# Patient Record
Sex: Male | Born: 1935 | Race: White | Hispanic: No | Marital: Married | Smoking: Never smoker
Health system: Southern US, Community
[De-identification: ages and names within clinical notes are randomized; demographics above are authoritative.]

## PROBLEM LIST (undated history)

## (undated) DIAGNOSIS — I10 Essential (primary) hypertension: Secondary | ICD-10-CM

## (undated) DIAGNOSIS — I350 Nonrheumatic aortic (valve) stenosis: Secondary | ICD-10-CM

## (undated) DIAGNOSIS — E7800 Pure hypercholesterolemia, unspecified: Secondary | ICD-10-CM

## (undated) DIAGNOSIS — I2510 Atherosclerotic heart disease of native coronary artery without angina pectoris: Secondary | ICD-10-CM

## (undated) DIAGNOSIS — M1990 Unspecified osteoarthritis, unspecified site: Secondary | ICD-10-CM

## (undated) DIAGNOSIS — IMO0002 Reserved for concepts with insufficient information to code with codable children: Secondary | ICD-10-CM

## (undated) DIAGNOSIS — N400 Enlarged prostate without lower urinary tract symptoms: Secondary | ICD-10-CM

## (undated) DIAGNOSIS — Z953 Presence of xenogenic heart valve: Secondary | ICD-10-CM

## (undated) DIAGNOSIS — R011 Cardiac murmur, unspecified: Secondary | ICD-10-CM

## (undated) DIAGNOSIS — K922 Gastrointestinal hemorrhage, unspecified: Secondary | ICD-10-CM

## (undated) DIAGNOSIS — R7303 Prediabetes: Secondary | ICD-10-CM

## (undated) HISTORY — DX: Presence of xenogenic heart valve: Z95.3

## (undated) HISTORY — DX: Benign prostatic hyperplasia without lower urinary tract symptoms: N40.0

## (undated) HISTORY — DX: Nonrheumatic aortic (valve) stenosis: I35.0

## (undated) HISTORY — DX: Reserved for concepts with insufficient information to code with codable children: IMO0002

## (undated) HISTORY — DX: Unspecified osteoarthritis, unspecified site: M19.90

## (undated) HISTORY — PX: APPENDECTOMY: SHX54

---

## 2003-05-25 ENCOUNTER — Ambulatory Visit (HOSPITAL_COMMUNITY): Admission: RE | Admit: 2003-05-25 | Discharge: 2003-05-25 | Payer: Self-pay | Admitting: Gastroenterology

## 2003-05-26 ENCOUNTER — Encounter (INDEPENDENT_AMBULATORY_CARE_PROVIDER_SITE_OTHER): Payer: Self-pay | Admitting: Specialist

## 2004-07-10 ENCOUNTER — Encounter (INDEPENDENT_AMBULATORY_CARE_PROVIDER_SITE_OTHER): Payer: Self-pay | Admitting: Specialist

## 2004-07-10 ENCOUNTER — Ambulatory Visit (HOSPITAL_COMMUNITY): Admission: RE | Admit: 2004-07-10 | Discharge: 2004-07-10 | Payer: Self-pay | Admitting: Gastroenterology

## 2009-05-10 ENCOUNTER — Ambulatory Visit: Payer: Self-pay | Admitting: Surgery

## 2011-02-18 NOTE — Procedures (Signed)
DUPLEX DEEP VENOUS EXAM - LOWER EXTREMITY   INDICATION:  Left knee pain.   HISTORY:  Edema:  No.  Trauma/Surgery:  No.  Pain:  No.  PE:  No.  Previous DVT:  No.  Anticoagulants:  No.  Other:   DUPLEX EXAM:                CFV   SFV   PopV  PTV    GSV                R  L  R  L  R  L  R   L  R  L  Thrombosis    o  o     o     o      o     o  Spontaneous   +  +     +     +      +     +  Phasic        +  +     +     +      +     +  Augmentation  +  +     +     +      +     +  Compressible  +  +     +     +      +     +  Competent     +  +     +     +      +     +   Legend:  + - yes  o - no  p - partial  D - decreased   IMPRESSION:  No evidence of deep or superficial vein thrombosis in left  lower extremity.    _____________________________  Janetta Hora Fields, MD   AC/MEDQ  D:  05/10/2009  T:  05/10/2009  Job:  [id number]

## 2011-02-21 NOTE — Op Note (Signed)
NAMEJOVIN, [name]               ACCOUNT NO.:  [id number]   MEDICAL RECORD NO.:  [id number]          PATIENT TYPE:  AMB   LOCATION:  ENDO                         FACILITY:  [facility]   PHYSICIAN:  Petra Kuba, M.D.    DATE OF BIRTH:  Apr 14, 1924   DATE OF PROCEDURE:  07/10/2004  DATE OF DISCHARGE:                                 OPERATIVE REPORT   PROCEDURE:  Colonoscopy with polypectomy.   INDICATION:  History of colon polyps, due for repeat screening.  Last one  had an anastomotic polyp that was not possibly completely removed based on  its lack of polypoid appearance, want to recheck.  Consent was signed after  risks, benefits, methods, options thoroughly discussed multiple times in the  past.   MEDICINES USED:  Demerol 60, Versed 6.   PROCEDURE:  Rectal inspection was pertinent for external hemorrhoids.  Small  digital exam was negative.  The video colonoscope was inserted, easily  advanced around the colon to the anastomosis.  This did not require any  abdominal pressure or any position changes.  On insertion, some left-sided  scattered diverticula were seen.  The anastomosis was identified by small  bowel side to colon end anastomosis.  There was one small right-sided polyp  seen that was hot biopsied x1 and put in the first container.  To get a  better look at the anastomosis we looked not only with him on his left side  but rolled him on his back and there were two small areas of the anastomosis  which were a little edematous and a few cold biopsies of the few different  areas were obtained but no obvious polypoid lesion was seen.  This was put  in the second container.  The scope was slowly withdrawn.  The prep was  adequate, there was some liquid stool that required washing and suctioning,  but on slow withdrawal through the rest of the colon other than the  occasional left-sided diverticula no other abnormalities were seen.  Once  back in the rectum, anorectal pull  through and retroflexion confirmed some  small hemorrhoids.  The scope was straightened and readvanced a short ways  up the left side of the colon.  Air was suctioned, scope removed.  The  patient tolerated the procedure well.  There was no obvious immediate  complication.   ENDOSCOPIC DIAGNOSES:  1.  Internal, external hemorrhoids.  2.  Occasional left-sided diverticula.  3.  Right colon small polyp hot biopsied.  4.  Anastomosis with a few areas of questionable edema and erythema status      post biopsy.  5.  Otherwise within normal limits to the anastomosis.   PLAN:  Await pathology to determine future colonic screening, otherwise  return to the care of Dr. Arvilla Market for the customary healthcare maintenance.  I will be on standby to help p.r.n.       MEM/MEDQ  D:  07/10/2004  T:  07/10/2004  Job:  [id number]   cc:   Donia Guiles, M.D.  [address] [place]  [place] [address]  Fax: [phone]

## 2011-02-21 NOTE — Op Note (Signed)
[name], [name]                         ACCOUNT NO.:  [id number]   MEDICAL RECORD NO.:  [id number]                   PATIENT TYPE:  AMB   LOCATION:  ENDO                                 FACILITY:  [facility]   PHYSICIAN:  Petra Kuba, M.D.                 DATE OF BIRTH:  12/06/1923   DATE OF PROCEDURE:  05/25/2003  DATE OF DISCHARGE:                                 OPERATIVE REPORT   PROCEDURE:  Colonoscopy with biopsy.   INDICATIONS:  The patient with a history of colon polyps.  Due for repeat  screening.  Consent was signed after risks, benefits, methods, and options  were thoroughly discussed in the office in the past.   PREMEDICATIONS:  Demerol 40 mg, Versed 4 mg.   DESCRIPTION OF PROCEDURE:  Rectal inspection pertinent for external  hemorrhoids, small.  Digital exam was negative.  Video colonoscope was  inserted and with minimal abdominal pressure, easily advanced around the  colon to the anastomosis.  Other than left-sided diverticula, no other  abnormalities was seen on insertion.  The anastomosis was identified, a  small bowel and colon end .  Along the anastomotic surgical line was a small  nodule.  We took a few biopsies of that.  It was probably just scar tissue.  We wanted to make sure.  Prep was adequate.  There was some liquid stool  that required washing and suctioning.  On slow withdrawal through the colon,  other than some left-sided diverticula, no other abnormalities were seen.  Specifically, no polyps, tumors or masses.  Anorectal pull-through and  retroflexion confirms some small hemorrhoids in the rectum.  The scope was  reinserted a short ways up the left side of the colon.  Air was suctioned  and the scope removed.  The patient tolerated the procedure well.  There  were no obvious immediate complications.   ENDOSCOPIC DIAGNOSES:  1. Internal and external hemorrhoids.  2. Left-sided diverticula.  3. Small bowel in the colon and anastomosis.  4.  Questionable anastomotic polyp, status post cold biopsy.  5. Otherwise within normal limits.    PLAN:  Await pathology.  If this is an adenoma, we will need to proceed  sooner and be more aggressive; otherwise consider if doing well medically,  recheck colon screening in five years.  Happy to see back p.r.n.  Otherwise  return care to Dr. Arvilla Market  for the customary health care maintenance to  include yearly rectals and guaiacs.                                                Petra Kuba, M.D.    MEM/MEDQ  D:  05/25/2003  T:  05/26/2003  Job:  [id number]   cc:   Izora Ribas  Roderick Pee, M.D.  [address] [place]  [place] [address]  Fax: [phone]

## 2011-10-13 DIAGNOSIS — L259 Unspecified contact dermatitis, unspecified cause: Secondary | ICD-10-CM | POA: Diagnosis not present

## 2011-10-13 DIAGNOSIS — L538 Other specified erythematous conditions: Secondary | ICD-10-CM | POA: Diagnosis not present

## 2011-11-10 ENCOUNTER — Emergency Department (HOSPITAL_COMMUNITY): Payer: Medicare Other

## 2011-11-10 ENCOUNTER — Emergency Department (HOSPITAL_COMMUNITY)
Admission: EM | Admit: 2011-11-10 | Discharge: 2011-11-10 | Disposition: A | Discharge status: Home / Self Care | Payer: Medicare Other | Attending: Emergency Medicine | Admitting: Emergency Medicine

## 2011-11-10 ENCOUNTER — Other Ambulatory Visit: Payer: Self-pay

## 2011-11-10 ENCOUNTER — Encounter (HOSPITAL_COMMUNITY): Payer: Self-pay | Admitting: Neurology

## 2011-11-10 DIAGNOSIS — R109 Unspecified abdominal pain: Secondary | ICD-10-CM | POA: Insufficient documentation

## 2011-11-10 DIAGNOSIS — R1013 Epigastric pain: Secondary | ICD-10-CM | POA: Diagnosis not present

## 2011-11-10 DIAGNOSIS — K5730 Diverticulosis of large intestine without perforation or abscess without bleeding: Secondary | ICD-10-CM | POA: Diagnosis not present

## 2011-11-10 DIAGNOSIS — R1110 Vomiting, unspecified: Secondary | ICD-10-CM | POA: Insufficient documentation

## 2011-11-10 DIAGNOSIS — K8020 Calculus of gallbladder without cholecystitis without obstruction: Secondary | ICD-10-CM | POA: Diagnosis not present

## 2011-11-10 HISTORY — DX: Pure hypercholesterolemia, unspecified: E78.00

## 2011-11-10 HISTORY — DX: Cardiac murmur, unspecified: R01.1

## 2011-11-10 HISTORY — DX: Prediabetes: R73.03

## 2011-11-10 HISTORY — DX: Essential (primary) hypertension: I10

## 2011-11-10 HISTORY — DX: Gastrointestinal hemorrhage, unspecified: K92.2

## 2011-11-10 LAB — COMPREHENSIVE METABOLIC PANEL
CO2: 26 mEq/L (ref 19–32)
Calcium: 10.8 mg/dL — ABNORMAL HIGH (ref 8.4–10.5)
Creatinine, Ser: 1.03 mg/dL (ref 0.50–1.35)
GFR calc Af Amer: 74 mL/min — ABNORMAL LOW (ref >90)
GFR calc non Af Amer: 64 mL/min — ABNORMAL LOW (ref >90)
Glucose, Bld: 229 mg/dL — ABNORMAL HIGH (ref 70–99)

## 2011-11-10 LAB — DIFFERENTIAL
Eosinophils Absolute: 0 10*3/uL (ref 0.0–0.7)
Eosinophils Relative: 0 % (ref 0–5)
Lymphocytes Relative: 8 % — ABNORMAL LOW (ref 12–46)
Lymphs Abs: 0.7 10*3/uL (ref 0.7–4.0)
Monocytes Absolute: 0.6 10*3/uL (ref 0.1–1.0)

## 2011-11-10 LAB — CBC
HCT: 35.4 % — ABNORMAL LOW (ref 39.0–52.0)
MCH: 29.7 pg (ref 26.0–34.0)
MCV: 85.5 fL (ref 78.0–100.0)
RBC: 4.14 MIL/uL — ABNORMAL LOW (ref 4.22–5.81)
RDW: 13.8 % (ref 11.5–15.5)
WBC: 8.3 10*3/uL (ref 4.0–10.5)

## 2011-11-10 LAB — LIPASE, BLOOD: Lipase: 29 U/L (ref 11–59)

## 2011-11-10 LAB — URINALYSIS, ROUTINE W REFLEX MICROSCOPIC
Leukocytes, UA: NEGATIVE
Nitrite: NEGATIVE
Protein, ur: NEGATIVE mg/dL
Urobilinogen, UA: 0.2 mg/dL (ref 0.0–1.0)

## 2011-11-10 MED ORDER — ONDANSETRON HCL 4 MG PO TABS: 4.0000 mg | ORAL_TABLET | Freq: Four times a day (QID) | ORAL | Status: AC

## 2011-11-10 MED ORDER — SODIUM CHLORIDE 0.9 % IV SOLN
Freq: Once | INTRAVENOUS | Status: AC
  Administered 2011-11-10: 08:00:00 via INTRAVENOUS

## 2011-11-10 MED ORDER — HYDROCODONE-ACETAMINOPHEN 5-500 MG PO TABS: 1.0000 | ORAL_TABLET | Freq: Four times a day (QID) | ORAL | Status: AC | PRN

## 2011-11-10 MED ORDER — ONDANSETRON HCL 4 MG/2ML IJ SOLN
4.0000 mg | Freq: Once | INTRAMUSCULAR | Status: AC
  Administered 2011-11-10: 4 mg via INTRAVENOUS
  Filled 2011-11-10: qty 2

## 2011-11-10 MED ORDER — IOHEXOL 300 MG/ML  SOLN
80.0000 mL | Freq: Once | INTRAMUSCULAR | Status: AC | PRN
  Administered 2011-11-10: 80 mL via INTRAVENOUS

## 2011-11-10 MED ORDER — MORPHINE SULFATE 4 MG/ML IJ SOLN
4.0000 mg | Freq: Once | INTRAMUSCULAR | Status: AC
  Administered 2011-11-10: 4 mg via INTRAVENOUS
  Filled 2011-11-10: qty 1

## 2011-11-10 NOTE — ED Notes (Signed)
Contrast given to patient

## 2011-11-10 NOTE — ED Provider Notes (Signed)
History     CSN: [id number]  Arrival date & time 11/10/11  4098   First MD Initiated Contact with Patient 11/10/11 [phone]      Chief Complaint  Patient presents with  . Abdominal Pain  . Emesis    (Consider location/radiation/quality/duration/timing/severity/associated sxs/prior treatment) HPI Comments: Started at 4AM with lower abd pain, left greater than right.  Has history of ruptured appendix 2-3 years ago.  No diarrhea.  No fevers.    Patient is a 78 y.o. male presenting with abdominal pain and vomiting. The history is provided by the patient.  Abdominal Pain The primary symptoms of the illness include abdominal pain and vomiting. The primary symptoms of the illness do not include fever, fatigue, shortness of breath, diarrhea or dysuria. The current episode started 6 to 12 hours ago. The onset of the illness was sudden. The problem has been gradually worsening.  The patient has not had a change in bowel habit. Symptoms associated with the illness do not include chills or constipation.  Emesis  Associated symptoms include abdominal pain. Pertinent negatives include no chills, no diarrhea and no fever.    No past medical history on file.  No past surgical history on file.  No family history on file.  History  Substance Use Topics  . Smoking status: Not on file  . Smokeless tobacco: Not on file  . Alcohol Use: Not on file      Review of Systems  Constitutional: Negative for fever, chills and fatigue.  Respiratory: Negative for shortness of breath.   Gastrointestinal: Positive for vomiting and abdominal pain. Negative for diarrhea and constipation.  Genitourinary: Negative for dysuria.  All other systems reviewed and are negative.    Allergies  Review of patient's allergies indicates not on file.  Home Medications  No current outpatient prescriptions on file.  BP 162/79  Pulse 73  Temp(Src) 97.7 F (36.5 C) (Oral)  Resp 20  SpO2 99%  Physical Exam    Nursing note and vitals reviewed. Constitutional: He is oriented to person, place, and time. He appears well-developed and well-nourished.  HENT:  Head: Normocephalic and atraumatic.  Neck: Normal range of motion. Neck supple.  Cardiovascular: Normal rate and regular rhythm.   Murmur heard.      2/6 sem heard best at rusb.  Pulmonary/Chest: Effort normal and breath sounds normal. No respiratory distress.  Abdominal: Soft. Bowel sounds are normal.       There is ttp in the lower abd, left greater than right.  No rebound or guarding.  Musculoskeletal: Normal range of motion. He exhibits no edema.  Neurological: He is alert and oriented to person, place, and time.  Skin: Skin is warm and dry. He is not diaphoretic.    ED Course  Procedures (including critical care time)   Labs Reviewed  CBC  DIFFERENTIAL  COMPREHENSIVE METABOLIC PANEL  LIPASE, BLOOD   No results found.   No diagnosis found.   Date: 11/10/2011  Rate: 77  Rhythm: normal sinus rhythm  QRS Axis: normal  Intervals: PR prolonged  ST/T Wave abnormalities: normal  Conduction Disutrbances:first-degree A-V block   Narrative Interpretation:   Old EKG Reviewed: none available    MDM  The labs and ct scan are reassuring.  I see no acute surgical findings.  Will treat with pain and nausea meds, give time.  To return prn if worsens.        Geoffery Lyons, MD 11/10/11 1048

## 2011-11-10 NOTE — ED Notes (Signed)
Pt reports waking up from sleep at 0130 with midepigastric pain. Reports nausea, vomiting about every 30 mins. Denying any diarrhea. Last BM was last night. Pt reports not being able to keep anything down, emesis is green in coloration. Pt alert and oriented. Denying any CP or SOB.

## 2011-11-10 NOTE — ED Notes (Signed)
Pt reports waking up last night 0130 with midepigastric abdominal pain. Reports vomiting every 30 mins. Emesis is green. Denying any diarrhea. Skin warm and dry. NAD. Lung sounds clear. No vomiting at this time. Reporting nausea. Denying any CP or SOB. Vitals stable. Family at bedside

## 2011-12-29 DIAGNOSIS — Z961 Presence of intraocular lens: Secondary | ICD-10-CM | POA: Diagnosis not present

## 2012-02-17 DIAGNOSIS — E782 Mixed hyperlipidemia: Secondary | ICD-10-CM | POA: Diagnosis not present

## 2012-02-17 DIAGNOSIS — I10 Essential (primary) hypertension: Secondary | ICD-10-CM | POA: Diagnosis not present

## 2012-02-17 DIAGNOSIS — L84 Corns and callosities: Secondary | ICD-10-CM | POA: Diagnosis not present

## 2012-02-17 DIAGNOSIS — I359 Nonrheumatic aortic valve disorder, unspecified: Secondary | ICD-10-CM | POA: Diagnosis not present

## 2012-02-17 DIAGNOSIS — E119 Type 2 diabetes mellitus without complications: Secondary | ICD-10-CM | POA: Diagnosis not present

## 2012-05-26 DIAGNOSIS — I10 Essential (primary) hypertension: Secondary | ICD-10-CM | POA: Diagnosis not present

## 2012-05-26 DIAGNOSIS — E782 Mixed hyperlipidemia: Secondary | ICD-10-CM | POA: Diagnosis not present

## 2012-05-26 DIAGNOSIS — I359 Nonrheumatic aortic valve disorder, unspecified: Secondary | ICD-10-CM | POA: Diagnosis not present

## 2012-05-27 DIAGNOSIS — I359 Nonrheumatic aortic valve disorder, unspecified: Secondary | ICD-10-CM | POA: Diagnosis not present

## 2012-05-27 DIAGNOSIS — I350 Nonrheumatic aortic (valve) stenosis: Secondary | ICD-10-CM

## 2012-05-27 HISTORY — DX: Nonrheumatic aortic (valve) stenosis: I35.0

## 2012-07-01 DIAGNOSIS — Z23 Encounter for immunization: Secondary | ICD-10-CM | POA: Diagnosis not present

## 2012-07-12 ENCOUNTER — Encounter: Payer: Self-pay | Admitting: *Deleted

## 2012-07-12 DIAGNOSIS — M1990 Unspecified osteoarthritis, unspecified site: Secondary | ICD-10-CM | POA: Insufficient documentation

## 2012-07-12 DIAGNOSIS — IMO0002 Reserved for concepts with insufficient information to code with codable children: Secondary | ICD-10-CM | POA: Insufficient documentation

## 2012-07-12 DIAGNOSIS — N400 Enlarged prostate without lower urinary tract symptoms: Secondary | ICD-10-CM | POA: Insufficient documentation

## 2012-07-12 DIAGNOSIS — Z953 Presence of xenogenic heart valve: Secondary | ICD-10-CM | POA: Insufficient documentation

## 2012-07-13 ENCOUNTER — Encounter: Payer: Self-pay | Admitting: Surgery

## 2012-07-13 ENCOUNTER — Institutional Professional Consult (permissible substitution) (INDEPENDENT_AMBULATORY_CARE_PROVIDER_SITE_OTHER): Payer: Medicare Other | Admitting: Surgery

## 2012-07-13 VITALS — BP 137/62 | HR 64 | Resp 18 | Ht 67.25 in | Wt 179.0 lb

## 2012-07-13 DIAGNOSIS — I359 Nonrheumatic aortic valve disorder, unspecified: Secondary | ICD-10-CM

## 2012-07-13 DIAGNOSIS — I350 Nonrheumatic aortic (valve) stenosis: Secondary | ICD-10-CM

## 2012-07-15 ENCOUNTER — Encounter: Payer: Self-pay | Admitting: Surgery

## 2012-07-15 NOTE — Progress Notes (Signed)
PCP is [name], MD Referring Provider is [name], MD  Chief Complaint  Patient presents with  . Aortic Stenosis    Referral from Dr [name] for surgical eval on aortic valve stenosis, ECHO 05/27/12    HPI:  The patient is an 43 her old fairly active gentleman with a history of diet-controlled diabetes, hypertension, hyperlipidemia, and a long history of peptic ulcer disease who has known aortic stenosis that has been followed by periodic echocardiogram. His most recent echocardiogram from 05/27/2012 shows critical aortic valve stenosis with a peak velocity of 4.1 cm/s with a peak gradient of 70 mm mercury and a mean gradient of 41 mm mercury. Aortic valve area is calculated at 0.74-0.81 cm. He has severe concentric left ventricular hypertrophy with ejection fraction of 60-65%. Right heart size and function are normal. There is mild mitral valve regurgitation with severe mitral annular calcification. The aortic valve leaflets are severely calcified. There is evidence of grade 2 diastolic dysfunction with elevated left atrial pressure. The patient has remained active and reports no symptoms.  Past Medical History  Diagnosis Date  . Borderline diabetes   . Hypertension   . Hypercholesteremia   . Heart murmur   . GI bleed   . Ulcer     PEPTIC ULCER DZ...DR. Ewing Schlein  . BPH (benign prostatic hypertrophy)   . Arthritis     OSTEO OF KNEE  . Aortic stenosis 05/27/12    Severe to critical    Past Surgical History  Procedure Date  . Appendectomy     History reviewed. No pertinent family history.  Social History History  Substance Use Topics  . Smoking status: Never Smoker   . Smokeless tobacco: Not on file  . Alcohol Use: No    Current Outpatient Prescriptions  Medication Sig Dispense Refill  . aspirin 325 MG EC tablet Take 325 mg by mouth daily as needed. For pain      . diltiazem (TIAZAC) 240 MG 24 hr capsule Take 240 mg by mouth daily.      . Famotidine  (PEPCID PO) Take 1 tablet by mouth daily.      Marland Kitchen glucose blood test strip 1 each by Other route as directed. ONE TOUCH ULTRA      . simvastatin (ZOCOR) 20 MG tablet Take 10 mg by mouth every evening.       . triamterene-hydrochlorothiazide (MAXZIDE-25) 37.5-25 MG per tablet Take 1 tablet by mouth daily.        No Known Allergies  Review of Systems  Constitutional: Positive for activity change. Negative for fever, chills, diaphoresis, appetite change, fatigue and unexpected weight change.  HENT: Negative.        Saw his dentist 1 week ago and uses antibiotic prophylaxis.  Eyes: Negative.   Respiratory: Negative.   Cardiovascular: Negative.   Gastrointestinal: Negative.   Genitourinary: Negative.   Musculoskeletal: Positive for arthralgias.  Neurological: Negative.   Hematological: Negative.   Psychiatric/Behavioral: Negative.     BP 137/62  Pulse 64  Resp 18  Ht 5' 7.25" (1.708 m)  Wt 179 lb (81.194 kg)  BMI 27.83 kg/m2  SpO2 98% Physical Exam  Constitutional: He is oriented to person, place, and time. He appears well-developed and well-nourished.  HENT:  Head: Normocephalic and atraumatic.  Mouth/Throat: Oropharynx is clear and moist.  Eyes: Conjunctivae normal and EOM are normal. Pupils are equal, round, and reactive to light.  Neck: Normal range of motion. Neck supple. No JVD present. No  thyromegaly present.  Cardiovascular: Normal rate and regular rhythm.        3/6 harsh systolic murmur over aorta  Pulmonary/Chest: Effort normal and breath sounds normal. No respiratory distress.  Abdominal: Soft. Bowel sounds are normal. He exhibits no distension and no mass. There is no tenderness.  Musculoskeletal: Normal range of motion. He exhibits no edema.  Lymphadenopathy:    He has no cervical adenopathy.  Neurological: He is alert and oriented to person, place, and time. He has normal strength. No cranial nerve deficit or sensory deficit.  Skin: Skin is warm and dry.    Psychiatric: He has a normal mood and affect.     Diagnostic Tests:  none  Impression/Plan:  He has critical aortic stenosis with severe concentric left ventricular hypertrophy by echocardiogram. Ejection fraction is normal. He reports being asymptomatic and has continued to mow his lawn over the summer and still works around the house. I am concerned he is reaching the point where he will start having significant symptoms and may have an acute decompensation. Since he is in overall good condition for his age I think it is best to consider proceeding ahead with aortic valve replacement while he is still still doing well. It is unlikely he will continue to do well for another year or two. I think he is a reasonable operative candidate especially considering his age and I don't think he is a candidate for TAVR, which is limited to unoperable patients at this time. I discussed the operative procedure in detail with the patient and his daughter, Erskine Squibb. I discussed the alternatives of surgery versus continued observation, benefits and risks of both, and they are inclined to proceed with surgery after Thanksgiving. He will require cardiac catheterization preoperatively and I think it would be best to do that as an outpatient and give him at least a week after the catheterization before during surgery. He is planning on going to the beach with his family over the Thanksgiving week and does not want to have cardiac catheterization right before he leaves. I think it would be best to do it either in the next few weeks or wait until he returns after Thanksgiving. I'll discuss this with Dr. [name].

## 2012-09-13 DIAGNOSIS — I10 Essential (primary) hypertension: Secondary | ICD-10-CM | POA: Diagnosis not present

## 2012-09-13 DIAGNOSIS — I359 Nonrheumatic aortic valve disorder, unspecified: Secondary | ICD-10-CM | POA: Diagnosis not present

## 2012-09-14 ENCOUNTER — Other Ambulatory Visit: Payer: Self-pay | Admitting: Interventional Cardiology

## 2012-09-15 ENCOUNTER — Other Ambulatory Visit: Payer: Self-pay | Admitting: Cardiology

## 2012-09-15 NOTE — H&P (Signed)
PRE CATH WORK UP/HS/424.1/LABS/CHEST XRAY/SEE LINDA.      HPI:     General:           [name] is a 78 yo male followed by Dr [name] with a hx of severe aortic stenosis with last echocardiogram 8/13 noted critical AS and LVH. Pt has been referrred to Dr Laneta Simmers to consider surgerical repair (TAVR), and needs cardiac cath prior to procedure. He states he is not having any pain but very fatigued, Shortness of breath and he does occasionally has chest tighness very fleeting last 1 minute, non exertional. He was at the beach for Thanksgiving and carried some light luggage without difficulty. He was not SOB walking into the office. no cough nor congestion..         Patient denies palpitations, syncope, swelling, nor PND. Occasional dizziness if bending over for a prolonged time. no falls.     ROS:      as noted in HPI, no fever, chills, nor respiratory complaints, no black or bloody BMs, no N/V, no falls, nor neurological changes. IVP dye--no allergies, appetite stable.     Medical History: Adult onset diabetes mellitus/diet, Hypertension, Hyperlipidemia, peptic ulcer disease - Dr Ewing Schlein, Benign prostatic hypotrophy, Osteoarthritis-knee, Aortic stenosis, Moderate with AVA .87 cm2 , 2011 Echo, repeat echocardiogram 8/13 with progressive critical Aortic stenosis, derm - Dr Terri Piedra, ophth - Dr Nile Riggs.      Surgical History: perf. appendectomy 01/1997, colonoscopy 8/04.      Hospitalization/Major Diagnostic Procedure: Gastric ulcer 1976.      Family History:  Father: deceased 35 yrs old age Mother: deceased 32 yrs Dementia Brother 1: deceased 37 yrs Infection Brother2: deceased 58 yrs Brother 3: alive [phone] yrs      Social History:      General: History of smoking  cigarettes: Never smoked.  no Alcohol. no Caffeine. no Recreational drug use. no Diet. Exercise: yes, walks daily. 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 Marital Status: married, Christeen Douglas (64 years married). Children: 2 kids Riley Lam), 3  grandkids.     For [employer].     Medications: Triamterene-HCTZ 37.5/25mg  tab 1 tablet once a day, Diltiazem HCl CR 240mg  cap 1 capsule once a day, Aspirin EC 325 MG Tablet Delayed Release 1 tablet as needed prn, Pepcid AC 10 MG Tablet 1 tablet daily, One touch ultra test strips TEST STRIPS strips as directed as directed, Simvastatin 10 MG tablet 1 tablet once a day, Medication List reviewed and reconciled with the patient     Allergies: N.K.D.A.     Objective:    Vitals: Wt 179.6, Wt change 2.8 lb, Ht 67.25, BMI 27.92, Pulse sitting 64, BP sitting 140/72.     Examination:     Cardiology Exam:         GENERAL APPEARANCE: pleasant, NAD, comfortable.  HEENT: normal.  CAROTID UPSTROKE: no bruit, No JVD.  JVD: flat.  HEART: regular rate and rhythm, normal S1S2, no rub, no gallop, or click.  HEART MURMUR: grade 4/6, systolic murmur.  LUNGS: clear to auscultation, no wheezing/rhonchi/rales.  ABDOMEN: soft, non-tender, + bowel sounds.  EXTREMITIES: trace pretibial leg edema.  PERIPHERAL PULSES: 2+, bilateral.  NEUROLOGIC: grossly intact, cranial nerves intact, gait WNL.  MOOD: normal.            Assessment:    Assessment:  1. Aortic valve disorders - 424.1 (Primary), pt with critical Aortic Stenosis with plans to proceed with week with cardiac catheterization for clearance for Aortic surgry with Dr  Bartle.    2. Essential hypertension, benign - 401.1     Plan:    1. Aortic valve disorders        LAB: CBC with Diff     WBC 7.9 4.0-11.0 - K/ul         RBC 4.00 4.20-5.80 - M/uL L        HGB 12.7 13.0-17.0 - g/dL L        HCT 78.2 95.6-21.3 - % L       MCH 31.9 27.0-33.0 - pg        MPV 9.1 7.5-10.7 - fL        MCV 87.1 80.0-94.0 - fL        MCHC 36.0 32.0-36.0 - g/dL        RDW 08.6 57.8-46.9 - %        NRBC# 0.00 -        PLT 166 150-400 - K/uL        NEUT % 68.0 43.3-71.9 - %        NRBC% 0.00 - %         LYMPH% 16.3 16.8-43.5 - % L       MONO % 11.1 4.6-12.4 - %        EOS % 3.8 0.0-7.8  - %        BASO % 0.8 0.0-1.0 - %        NEUT # 5.3 1.9-7.2 - K/uL        LYMPH# 1.30 1.10-2.70 - K/uL         MONO # 0.9 0.3-0.8 - K/uL H       EOS # 0.3 0.0-0.6 - K/uL        BASO # 0.1 0.0-0.1 - K/uL               Apryl Brymer A 09/13/2012 01:08:35 PM > ok for cath        LAB: Comp Metabolic Panel      GLUCOSE 115 70-99 - mg/dL H       BUN 19 6-29 - mg/dL        CREATININE 5.28 0.60-1.30 - mg/dl         eGFR (NON-AFRICAN AMERICAN) 55 >60 - calc L       eGFR (AFRICAN AMERICAN) 67 >60 - calc        SODIUM 141 136-145 - mmol/L        POTASSIUM 4.3 3.5-5.5 - mmol/L        CHLORIDE 103 98-107 - mmol/L        C02 31 22-32 - mmol/L        ANION GAP 10.8 6.0-20.0 - mmol/L         CALCIUM 11.1 8.6-10.3 - mg/dL H       T PROTEIN 7.2 4.1-3.2 - g/dL        ALBUMIN 4.5 4.4-0.1 - g/dL        T.BILI 0.7 0.3-1.0 - mg/dL        ALP 56 02-725 - U/L        AST 19 0-39 - U/L        ALT 13 0-52 - U/L               Lakima Dona A 09/13/2012 01:09:05 PM > ok for cath        LAB: PT and PTT ([id number])     aPTT 30 24-33 - SEC  INR 1.0 0.8-1.2 -        Prothrombin Time 11.1 9.1-12.0 - SEC               Ruhan Borak A 09/14/2012 12:23:12 PM > ok for procedure   Diagnostic Imaging:Chest PA/Lat (Ordered for 09/13/2012) Phairas,Jodi 09/13/2012 10:53:47 AM > , x-ray completed.    Risks and benefits of cardiac catheterization have been reviewed including risk of stroke, heart attack, death, bleeding, renal impariment and arterial damage. There was ample oppurtuny to answer questions. Alternatives were discussed. Patient understands and wishes to proceed.          Procedures:      Venipuncture:          Venipuncture: performed in right arm, Howell,Kathleen 09/13/2012 11:02:25 AM > .          Immunizations:       Labs:      Procedure Codes: 28413 ECL CBC PLATELET DIFF, 80053 ECL COMP METABOLIC PANEL, 24401 BLOOD COLLECTION ROUTINE VENIPUNCTURE     Preventive:           Follow Up: HS  pending procedure (Reason: post cath)        Provider: Michaell Cowing. Emelda Fear, NP  Patient: Eric, Duncan  DOB: [birth year]  Date: 09/13/2012

## 2012-09-16 ENCOUNTER — Encounter (HOSPITAL_COMMUNITY): Payer: Self-pay | Admitting: General Practice

## 2012-09-16 ENCOUNTER — Inpatient Hospital Stay (HOSPITAL_BASED_OUTPATIENT_CLINIC_OR_DEPARTMENT_OTHER)
Admission: RE | Admit: 2012-09-16 | Discharge: 2012-09-16 | Disposition: A | Discharge status: Hospice | Payer: Medicare Other | Source: Ambulatory Visit | Attending: Interventional Cardiology | Admitting: Interventional Cardiology

## 2012-09-16 ENCOUNTER — Other Ambulatory Visit: Payer: Self-pay | Admitting: *Deleted

## 2012-09-16 ENCOUNTER — Inpatient Hospital Stay (HOSPITAL_COMMUNITY): Payer: Medicare Other

## 2012-09-16 ENCOUNTER — Encounter (HOSPITAL_BASED_OUTPATIENT_CLINIC_OR_DEPARTMENT_OTHER)
Admission: RE | Disposition: A | Discharge status: Hospice | Payer: Self-pay | Source: Ambulatory Visit | Attending: Interventional Cardiology

## 2012-09-16 ENCOUNTER — Inpatient Hospital Stay (HOSPITAL_COMMUNITY)
Admission: AD | Admit: 2012-09-16 | Discharge: 2012-09-24 | DRG: 217 | Disposition: A | Discharge status: Home / Self Care | Payer: Medicare Other | Source: Ambulatory Visit | Attending: Cardiothoracic Surgery | Admitting: Cardiothoracic Surgery

## 2012-09-16 DIAGNOSIS — I359 Nonrheumatic aortic valve disorder, unspecified: Secondary | ICD-10-CM | POA: Insufficient documentation

## 2012-09-16 DIAGNOSIS — I350 Nonrheumatic aortic (valve) stenosis: Secondary | ICD-10-CM

## 2012-09-16 DIAGNOSIS — I2510 Atherosclerotic heart disease of native coronary artery without angina pectoris: Secondary | ICD-10-CM | POA: Diagnosis present

## 2012-09-16 DIAGNOSIS — E876 Hypokalemia: Secondary | ICD-10-CM | POA: Diagnosis not present

## 2012-09-16 DIAGNOSIS — Z7982 Long term (current) use of aspirin: Secondary | ICD-10-CM | POA: Diagnosis not present

## 2012-09-16 DIAGNOSIS — N400 Enlarged prostate without lower urinary tract symptoms: Secondary | ICD-10-CM | POA: Diagnosis present

## 2012-09-16 DIAGNOSIS — I4891 Unspecified atrial fibrillation: Secondary | ICD-10-CM

## 2012-09-16 DIAGNOSIS — Z79899 Other long term (current) drug therapy: Secondary | ICD-10-CM | POA: Diagnosis not present

## 2012-09-16 DIAGNOSIS — Z951 Presence of aortocoronary bypass graft: Secondary | ICD-10-CM | POA: Diagnosis present

## 2012-09-16 DIAGNOSIS — I10 Essential (primary) hypertension: Secondary | ICD-10-CM | POA: Diagnosis present

## 2012-09-16 DIAGNOSIS — R7309 Other abnormal glucose: Secondary | ICD-10-CM | POA: Diagnosis present

## 2012-09-16 DIAGNOSIS — D696 Thrombocytopenia, unspecified: Secondary | ICD-10-CM | POA: Diagnosis present

## 2012-09-16 DIAGNOSIS — E7800 Pure hypercholesterolemia, unspecified: Secondary | ICD-10-CM | POA: Diagnosis present

## 2012-09-16 DIAGNOSIS — I200 Unstable angina: Secondary | ICD-10-CM | POA: Diagnosis present

## 2012-09-16 DIAGNOSIS — Z9889 Other specified postprocedural states: Secondary | ICD-10-CM | POA: Diagnosis not present

## 2012-09-16 DIAGNOSIS — Z952 Presence of prosthetic heart valve: Secondary | ICD-10-CM

## 2012-09-16 DIAGNOSIS — D62 Acute posthemorrhagic anemia: Secondary | ICD-10-CM | POA: Diagnosis not present

## 2012-09-16 DIAGNOSIS — J9819 Other pulmonary collapse: Secondary | ICD-10-CM | POA: Diagnosis not present

## 2012-09-16 DIAGNOSIS — N401 Enlarged prostate with lower urinary tract symptoms: Secondary | ICD-10-CM | POA: Diagnosis not present

## 2012-09-16 DIAGNOSIS — J449 Chronic obstructive pulmonary disease, unspecified: Secondary | ICD-10-CM | POA: Diagnosis not present

## 2012-09-16 DIAGNOSIS — J90 Pleural effusion, not elsewhere classified: Secondary | ICD-10-CM | POA: Diagnosis not present

## 2012-09-16 HISTORY — DX: Atherosclerotic heart disease of native coronary artery without angina pectoris: I25.10

## 2012-09-16 HISTORY — DX: Presence of aortocoronary bypass graft: Z95.1

## 2012-09-16 LAB — PROTIME-INR
INR: 1.25 (ref 0.00–1.49)
Prothrombin Time: 15.5 seconds — ABNORMAL HIGH (ref 11.6–15.2)

## 2012-09-16 LAB — COMPREHENSIVE METABOLIC PANEL
ALT: 12 U/L (ref 0–53)
AST: 22 U/L (ref 0–37)
Albumin: 3.5 g/dL (ref 3.5–5.2)
Alkaline Phosphatase: 55 U/L (ref 39–117)
BUN: 24 mg/dL — ABNORMAL HIGH (ref 6–23)
CO2: 28 mEq/L (ref 19–32)
Calcium: 10 mg/dL (ref 8.4–10.5)
Chloride: 103 mEq/L (ref 96–112)
Creatinine, Ser: 1.3 mg/dL (ref 0.50–1.35)
GFR calc Af Amer: 56 mL/min — ABNORMAL LOW (ref >90)
GFR calc non Af Amer: 48 mL/min — ABNORMAL LOW (ref >90)
Glucose, Bld: 124 mg/dL — ABNORMAL HIGH (ref 70–99)
Potassium: 3.8 mEq/L (ref 3.5–5.1)
Sodium: 139 mEq/L (ref 135–145)
Total Bilirubin: 0.5 mg/dL (ref 0.3–1.2)
Total Protein: 6.7 g/dL (ref 6.0–8.3)

## 2012-09-16 LAB — CBC WITH DIFFERENTIAL/PLATELET
Basophils Absolute: 0 10*3/uL (ref 0.0–0.1)
Basophils Relative: 0 % (ref 0–1)
Eosinophils Absolute: 0 10*3/uL (ref 0.0–0.7)
Eosinophils Relative: 0 % (ref 0–5)
HCT: 31.2 % — ABNORMAL LOW (ref 39.0–52.0)
Hemoglobin: 11.1 g/dL — ABNORMAL LOW (ref 13.0–17.0)
Lymphocytes Relative: 23 % (ref 12–46)
Lymphs Abs: 1.5 10*3/uL (ref 0.7–4.0)
MCH: 30.8 pg (ref 26.0–34.0)
MCHC: 35.6 g/dL (ref 30.0–36.0)
MCV: 86.7 fL (ref 78.0–100.0)
Monocytes Absolute: 0.8 10*3/uL (ref 0.1–1.0)
Monocytes Relative: 13 % — ABNORMAL HIGH (ref 3–12)
Neutro Abs: 4.2 10*3/uL (ref 1.7–7.7)
Neutrophils Relative %: 64 % (ref 43–77)
Platelets: 150 10*3/uL (ref 150–400)
RBC: 3.6 MIL/uL — ABNORMAL LOW (ref 4.22–5.81)
RDW: 13.8 % (ref 11.5–15.5)
WBC: 6.6 10*3/uL (ref 4.0–10.5)

## 2012-09-16 LAB — BLOOD GAS, ARTERIAL
Acid-Base Excess: 0.1 mmol/L (ref 0.0–2.0)
Bicarbonate: 24 mEq/L (ref 20.0–24.0)
Drawn by: 347641
FIO2: 0.21 %
O2 Saturation: 93 %
Patient temperature: 98.6
TCO2: 25.1 mmol/L (ref 0–100)
pCO2 arterial: 37.6 mmHg (ref 35.0–45.0)
pH, Arterial: 7.421 (ref 7.350–7.450)
pO2, Arterial: 65.6 mmHg — ABNORMAL LOW (ref 80.0–100.0)

## 2012-09-16 LAB — APTT: aPTT: 52 seconds — ABNORMAL HIGH (ref 24–37)

## 2012-09-16 LAB — SURGICAL PCR SCREEN
MRSA, PCR: NEGATIVE
Staphylococcus aureus: NEGATIVE

## 2012-09-16 LAB — POCT I-STAT 3, ART BLOOD GAS (G3+)
O2 Saturation: 96 %
pCO2 arterial: 41.6 mmHg (ref 35.0–45.0)
pH, Arterial: 7.361 (ref 7.350–7.450)
pO2, Arterial: 84 mmHg (ref 80.0–100.0)

## 2012-09-16 LAB — POCT I-STAT 3, VENOUS BLOOD GAS (G3P V)
Bicarbonate: 23.8 mEq/L (ref 20.0–24.0)
pH, Ven: 7.358 — ABNORMAL HIGH (ref 7.250–7.300)
pO2, Ven: 37 mmHg (ref 30.0–45.0)

## 2012-09-16 LAB — GLUCOSE, CAPILLARY: Glucose-Capillary: 154 mg/dL — ABNORMAL HIGH (ref 70–99)

## 2012-09-16 SURGERY — JV LEFT AND RIGHT HEART CATHETERIZATION WITH CORONARY ANGIOGRAM
Anesthesia: Moderate Sedation

## 2012-09-16 MED ORDER — HEPARIN (PORCINE) IN NACL 100-0.45 UNIT/ML-% IJ SOLN
1350.0000 [IU]/h | INTRAMUSCULAR | Status: DC
  Administered 2012-09-16: 1350 [IU]/h via INTRAVENOUS
  Filled 2012-09-16 (×3): qty 250

## 2012-09-16 MED ORDER — ONDANSETRON HCL 4 MG/2ML IJ SOLN: 4.0000 mg | Freq: Four times a day (QID) | INTRAMUSCULAR | Status: DC | PRN

## 2012-09-16 MED ORDER — BISACODYL 5 MG PO TBEC
5.0000 mg | DELAYED_RELEASE_TABLET | Freq: Once | ORAL | Status: DC
  Filled 2012-09-16: qty 1

## 2012-09-16 MED ORDER — EPINEPHRINE HCL 1 MG/ML IJ SOLN
0.5000 ug/min | INTRAVENOUS | Status: DC
  Filled 2012-09-16: qty 4

## 2012-09-16 MED ORDER — SODIUM CHLORIDE 0.9 % IV SOLN: INTRAVENOUS | Status: DC

## 2012-09-16 MED ORDER — FAMOTIDINE 20 MG PO TABS
20.0000 mg | ORAL_TABLET | Freq: Every day | ORAL | Status: DC
  Administered 2012-09-16: 20 mg via ORAL
  Filled 2012-09-16 (×2): qty 1

## 2012-09-16 MED ORDER — NITROGLYCERIN IN D5W 200-5 MCG/ML-% IV SOLN
2.0000 ug/min | INTRAVENOUS | Status: DC
  Filled 2012-09-16: qty 250

## 2012-09-16 MED ORDER — METOPROLOL TARTRATE 12.5 MG HALF TABLET
12.5000 mg | ORAL_TABLET | Freq: Once | ORAL | Status: AC
  Administered 2012-09-17: 12.5 mg via ORAL
  Filled 2012-09-16: qty 1

## 2012-09-16 MED ORDER — OXYCODONE-ACETAMINOPHEN 5-325 MG PO TABS: 1.0000 | ORAL_TABLET | ORAL | Status: DC | PRN

## 2012-09-16 MED ORDER — DOPAMINE-DEXTROSE 3.2-5 MG/ML-% IV SOLN
2.0000 ug/kg/min | INTRAVENOUS | Status: DC
  Filled 2012-09-16: qty 250

## 2012-09-16 MED ORDER — TEMAZEPAM 15 MG PO CAPS: 15.0000 mg | ORAL_CAPSULE | Freq: Once | ORAL | Status: AC | PRN

## 2012-09-16 MED ORDER — MAGNESIUM SULFATE 50 % IJ SOLN
40.0000 meq | INTRAMUSCULAR | Status: DC
  Filled 2012-09-16: qty 10

## 2012-09-16 MED ORDER — ASPIRIN EC 325 MG PO TBEC
325.0000 mg | DELAYED_RELEASE_TABLET | Freq: Every day | ORAL | Status: DC
  Administered 2012-09-16: 325 mg via ORAL
  Filled 2012-09-16: qty 1

## 2012-09-16 MED ORDER — ACETAMINOPHEN 325 MG PO TABS: 650.0000 mg | ORAL_TABLET | ORAL | Status: DC | PRN

## 2012-09-16 MED ORDER — SIMVASTATIN 10 MG PO TABS
10.0000 mg | ORAL_TABLET | Freq: Every evening | ORAL | Status: DC
  Administered 2012-09-18 – 2012-09-23 (×6): 10 mg via ORAL
  Filled 2012-09-16 (×10): qty 1

## 2012-09-16 MED ORDER — HEPARIN (PORCINE) IN NACL 100-0.45 UNIT/ML-% IJ SOLN
1350.0000 [IU]/h | INTRAMUSCULAR | Status: DC
  Filled 2012-09-16: qty 250

## 2012-09-16 MED ORDER — DILTIAZEM HCL ER BEADS 240 MG PO CP24
240.0000 mg | ORAL_CAPSULE | Freq: Every day | ORAL | Status: DC
  Filled 2012-09-16 (×2): qty 1

## 2012-09-16 MED ORDER — CHLORHEXIDINE GLUCONATE 4 % EX LIQD
60.0000 mL | Freq: Once | CUTANEOUS | Status: AC
  Administered 2012-09-17: 4 via TOPICAL
  Filled 2012-09-16: qty 60

## 2012-09-16 MED ORDER — SODIUM CHLORIDE 0.9 % IV SOLN
INTRAVENOUS | Status: DC
  Administered 2012-09-16: 11:00:00 via INTRAVENOUS

## 2012-09-16 MED ORDER — PHENYLEPHRINE HCL 10 MG/ML IJ SOLN
30.0000 ug/min | INTRAVENOUS | Status: DC
  Filled 2012-09-16: qty 2

## 2012-09-16 MED ORDER — SODIUM CHLORIDE 0.9 % IV SOLN: 250.0000 mL | INTRAVENOUS | Status: DC | PRN

## 2012-09-16 MED ORDER — SODIUM CHLORIDE 0.9 % IV SOLN
INTRAVENOUS | Status: DC
  Filled 2012-09-16: qty 40

## 2012-09-16 MED ORDER — SODIUM CHLORIDE 0.9 % IJ SOLN: 3.0000 mL | Freq: Two times a day (BID) | INTRAMUSCULAR | Status: DC

## 2012-09-16 MED ORDER — DEXTROSE 5 % IV SOLN
1.5000 g | INTRAVENOUS | Status: AC
  Administered 2012-09-17: 1.5 g via INTRAVENOUS
  Administered 2012-09-17: .75 g via INTRAVENOUS
  Filled 2012-09-16: qty 1.5

## 2012-09-16 MED ORDER — SODIUM CHLORIDE 0.9 % IV SOLN
INTRAVENOUS | Status: DC
  Filled 2012-09-16: qty 1

## 2012-09-16 MED ORDER — POTASSIUM CHLORIDE 2 MEQ/ML IV SOLN
80.0000 meq | INTRAVENOUS | Status: DC
  Filled 2012-09-16: qty 40

## 2012-09-16 MED ORDER — SODIUM CHLORIDE 0.9 % IJ SOLN: 3.0000 mL | INTRAMUSCULAR | Status: DC | PRN

## 2012-09-16 MED ORDER — DEXTROSE 5 % IV SOLN
750.0000 mg | INTRAVENOUS | Status: DC
  Filled 2012-09-16 (×2): qty 750

## 2012-09-16 MED ORDER — NITROGLYCERIN 0.4 MG SL SUBL: 0.4000 mg | SUBLINGUAL_TABLET | SUBLINGUAL | Status: DC | PRN

## 2012-09-16 MED ORDER — TRIAMTERENE-HCTZ 37.5-25 MG PO TABS
1.0000 | ORAL_TABLET | Freq: Every day | ORAL | Status: DC
  Administered 2012-09-16: 1 via ORAL
  Filled 2012-09-16 (×2): qty 1

## 2012-09-16 MED ORDER — DEXMEDETOMIDINE HCL IN NACL 400 MCG/100ML IV SOLN
0.1000 ug/kg/h | INTRAVENOUS | Status: DC
  Filled 2012-09-16: qty 100

## 2012-09-16 MED ORDER — ALPRAZOLAM 0.25 MG PO TABS: 0.2500 mg | ORAL_TABLET | Freq: Two times a day (BID) | ORAL | Status: DC | PRN

## 2012-09-16 MED ORDER — PLASMA-LYTE 148 IV SOLN
INTRAVENOUS | Status: AC
  Administered 2012-09-17: 11:00:00
  Filled 2012-09-16: qty 2.5

## 2012-09-16 MED ORDER — VANCOMYCIN HCL 10 G IV SOLR
1250.0000 mg | INTRAVENOUS | Status: AC
  Administered 2012-09-17: 1250 mg via INTRAVENOUS
  Filled 2012-09-16: qty 1250

## 2012-09-16 MED ORDER — SODIUM CHLORIDE 0.9 % IV SOLN: INTRAVENOUS | Status: AC

## 2012-09-16 MED ORDER — ASPIRIN 81 MG PO CHEW: 324.0000 mg | CHEWABLE_TABLET | ORAL | Status: DC

## 2012-09-16 MED ORDER — DIAZEPAM 5 MG PO TABS
5.0000 mg | ORAL_TABLET | ORAL | Status: AC
  Administered 2012-09-16: 5 mg via ORAL

## 2012-09-16 NOTE — Consult Note (Signed)
[address]            [place] [address]          [phone]      DEEJAY KOPPELMAN [facility] Medical Record #[id number] Date of Birth: October 07, 1923  Referring: Dr. [name] Primary Care: [name], MD  Chief Complaint:   Aortic stenosis and critical left main   History of Present Illness:     The patient is an 29 her old fairly active gentleman with a history of diet-controlled diabetes, hypertension, hyperlipidemia, and a long history of peptic ulcer disease who has known aortic stenosis that has been followed by periodic echocardiogram. His most recent echocardiogram from 05/27/2012 shows critical aortic valve stenosis with a peak velocity of 4.1 cm/s with a peak gradient of 70 mm mercury and a mean gradient of 41 mm mercury. Aortic valve area is calculated at 0.74-0.81 cm. He has severe concentric left ventricular hypertrophy with ejection fraction of 60-65%. Right heart size and function are normal. There is mild mitral valve regurgitation with severe mitral annular calcification. The aortic valve leaflets are severely calcified. There is evidence of grade 2 diastolic dysfunction with elevated left atrial pressure.   For the past 2 months the patient's had subtle chest discomfort occasional faintness but no syncope he remains active. He comes in today for cardiac catheterization and consideration for TVAR in the future. His cath films reveal critical left main obstruction. Urgent consultation is requested for coronary artery bypass grafting and aortic valve replacement.  Current Activity/ Functional Status: Patient is independent with mobility/ambulation, transfers, ADL's, IADL's.   Past Medical History  Diagnosis Date  . Borderline diabetes   . Hypertension   . Hypercholesteremia   . Heart murmur   . GI bleed   . Ulcer     PEPTIC ULCER DZ...DR. Ewing Schlein  . BPH (benign prostatic hypertrophy)   . Arthritis     OSTEO OF KNEE  . Aortic  stenosis 05/27/12    Severe to critical  . Coronary artery disease     Past Surgical History  Procedure Date  . Appendectomy     History reviewed. No pertinent family history.  History   Social History  . Marital Status: Married    Spouse Name: N/A    Number of Children: N/A  . Years of Education: N/A   Occupational History  . Not on file.   Social History Main Topics  . Smoking status: Never Smoker   . Smokeless tobacco: Never Used  . Alcohol Use: No  . Drug Use: No  . Sexually Active:    Other Topics Concern  . Not on file   Social History Narrative  . No narrative on file    History  Smoking status  . Never Smoker   Smokeless tobacco  . Never Used    History  Alcohol Use No     No Known Allergies  Current Facility-Administered Medications  Medication Dose Route Frequency Provider Last Rate Last Dose  . 0.9 %  sodium chloride infusion   Intravenous Continuous Lyn Records III, MD      . acetaminophen (TYLENOL) tablet 650 mg  650 mg Oral Q4H PRN Lyn Records III, MD      . ALPRAZolam Prudy Feeler) tablet 0.25 mg  0.25 mg Oral BID PRN [name], MD      . aspirin EC  tablet 325 mg  325 mg Oral Daily Lyn Records III, MD      . diltiazem [facility]) 24 hr capsule 240 mg  240 mg Oral Daily Lyn Records III, MD      . famotidine (PEPCID) tablet 20 mg  20 mg Oral Daily Lyn Records III, MD      . heparin ADULT infusion 100 units/mL (25000 units/250 mL)  1,350 Units/hr Intravenous Continuous Lyn Records III, MD      . nitroGLYCERIN (NITROSTAT) SL tablet 0.4 mg  0.4 mg Sublingual Q5 Min x 3 PRN Lyn Records III, MD      . ondansetron [facility]) injection 4 mg  4 mg Intravenous Q6H PRN Lyn Records III, MD      . simvastatin (ZOCOR) tablet 10 mg  10 mg Oral QPM Lyn Records III, MD      . triamterene-hydrochlorothiazide (MAXZIDE-25) 37.5-25 MG per tablet 1 each  1 each Oral Daily [name], MD           Review of Systems:     Cardiac Review  of Systems: Y or N  Chest Pain [  y]  Resting SOB [ n] Exertional SOB  [ y ]  Orthopnea [ n ]   Pedal Edema [n ]    Palpitations [ n ] Syncope  [ n ]   Presyncope [ y  General Review of Systems: [Y] = yes [  ]=no Constitional: recent weight change [  ]; anorexia [  ]; fatigue [  ]; nausea [  ]; night sweats [  ]; fever [  ]; or chills [  ];                                                                                                                                          Dental: poor dentition[n]; Last Dentist visit:   Eye : blurred vision [  ]; diplopia [   ]; vision changes [  ];  Amaurosis fugax[  ]; Resp: cough [n ];  wheezing[  ];  hemoptysis[n  ]; shortness of breath[  ]; paroxysmal nocturnal dyspnea[  ]; dyspnea on exertion[ y ]; or orthopnea[  ];  GI:  gallstones[  ], vomiting[  ];  dysphagia[  ]; melena[  ];  hematochezia [  ]; heartburn[  ];   Hx of  Colonoscopy[  ]; GU: kidney stones [  ]; hematuria[  ];   dysuria [  ];  nocturia[  ];  history of     obstruction [  ];             Skin: rash, swelling[  ];, hair loss[  ];  peripheral edema[  ];  or itching[  ]; Musculosketetal: myalgias[  ];  joint swelling[  ];  joint erythema[  ];  joint pain[  ];  back  pain[  ];  Heme/Lymph: bruising[  ];  bleeding[  ];  anemia[  ];  Neuro: TIA[  ];  headaches[  ];  stroke[  ];  vertigo[  ];  seizures[  ];   paresthesias[  ];  difficulty walking[n ];  Psych:depression[  ]; anxiety[  ];  Endocrine: diabetes[  ];  thyroid dysfunction[  ];  Immunizations: Flu [ y ]; Pneumococcal[ n ];  Other:  Physical Exam: BP 136/56  Pulse 59  Temp 97.7 F (36.5 C) (Oral)  Resp 18  SpO2 97%  General appearance: alert, cooperative, appears stated age and no distress Neurologic: intact Heart: regular rate and rhythm and systolic murmur: holosystolic 4/6, harsh throughout the precordium Lungs: clear to auscultation bilaterally and normal percussion bilaterally Abdomen: soft, non-tender; bowel sounds  normal; no masses,  no organomegaly Extremities: extremities normal, atraumatic, no cyanosis or edema and Homans sign is negative, no sign of DVT No carotid bruits, has palpable posterior tibial pulses bilaterally, the right groin cath site is intact with a dressing without significant hematoma  Diagnostic Studies & Laboratory data:     Recent Radiology Findings:  Chest x-ray with was done in Dr. [name] office report is on the paper chart   Recent Lab Findings: Lab Results  Component Value Date   WBC 8.3 11/10/2011   HGB 12.3* 11/10/2011   HCT 35.4* 11/10/2011   PLT 173 11/10/2011   GLUCOSE 229* 11/10/2011   ALT 14 11/10/2011   AST 20 11/10/2011   NA 137 11/10/2011   K 3.4* 11/10/2011   CL 100 11/10/2011   CREATININE 1.03 11/10/2011   BUN 23 11/10/2011   CO2 26 11/10/2011   CARDIAC CATH:  Procedure Date:09/16/2012  Referring Physician: Evelene Croon, MD  Primary Cardiologist: [name], III, MD  PROCEDURE: Left heart catheterization with selective coronary angiography, left ventriculogram.  INDICATIONS: Severe aortic stenosis undergoing a left and right heart cath to document pulmonary artery pressures and coronary anatomy.  The risks, benefits, and details of the procedure were explained to the patient. The patient verbalized understanding and wanted to proceed. Informed written consent was obtained.  PROCEDURE TECHNIQUE: After Xylocaine anesthesia a 5 French right femoral artery and 7 French right femoral vein sheaths were placed in the right femoral artery with a single anterior needle wall stick. Coronary angiography was done using a left and right 4 cm 5 French Judkins catheters. Left ventriculography was not done.  CONTRAST: Total of 50 cc.  COMPLICATIONS: None.  HEMODYNAMICS: Aortic pressure was 127/55 mmHg; LV pressure was not recorded; LVEDP not recorded. There is no severe aortic stenosis based on echo data;( discussed with dr [name] this in error ) right atrial mean 6 mmHg; right ventricular  pressure 34/4 mmHg; main pulmonary artery pressure 31/13 mmHg; pulmonary capillary wedge pressure 14 mmHg.  ANGIOGRAPHIC DATA: The left main coronary artery is heavily calcified and 99% segmental ostium to bifurcation stenosis with TIMI grade 3 flow.  The left anterior descending artery is heavily calcified with hazy 50% stenosis in the very proximal vessel and 50% stenosis in the first diagonal. The LAD is otherwise large.  The left circumflex artery is is basically one obtuse marginal branch or a ramus branch that is severely diseased proximally. I was unable to demonstrate any AV groove circumflex vasculature to  The right coronary artery is 80% mid RCA 50% mid to distal RCA the vessel is heavily calcified. The right coronary is dominant, giving the PDA and 3 left ventricular and  to mid lateral wall branches.  LEFT VENTRICULOGRAM: Not performed  IMPRESSIONS:  1. Severe coronary disease with 95+ percent ostial to distal left main, moderate proximal LAD disease, and high-grade stenosis in the relatively small circumflex. Severe mid RCA disease.  2. No severe calcific aortic stenosis  3. Calcified ascending and arch aorta  4. Normal pulmonary artery pressures  5. Left ventriculography not performed   Assessment / Plan:      Critical aortic stenosis with velocity across the aortic valve 4.1 cm/s consistent with critical aortic stenosis, in addition the patient has critical left main obstruction and high-grade right coronary artery obstruction. He is very functional in spite of his age of 27 years. And we'll plan to proceed with aortic valve replacement coronary artery bypass grafting tomorrow.  The risks and options have been discussed with he and his wife and daughter in great detail and he is agreeable with proceeding.  The goals risks and alternatives of the planned surgical procedure AVR, CABG  have been discussed with the patient in detail. The risks of the procedure including death,  infection, stroke, heart block,  myocardial infarction, bleeding, blood transfusion have all been discussed specifically.  I have quoted NELTON AMSDEN a 6 % of perioperative mortality and a complication rate as high as 25 %. The patient's questions have been answered.KOLSTON LACOUNT is willing  to proceed with the planned procedure.  [name] MD  Beeper [phone] Office [phone] 09/16/2012 6:37 PM

## 2012-09-16 NOTE — H&P (Addendum)
[name] is a 78 y.o. male  Admit date: 09/16/2012  Referring Physician:  Evelene Croon, MD Primary Cardiologist: [name], III Chief complaint / reason for admission:  Angina at rest  HPI:  Known severe aortic stenosis by echo. Underwent cath this AM as prelude to AVR. Found to have severe calcified left and right coronary arteries with segmental 98+% left main, and severe mid RCA. Upon questioning the patient post procedure, he admits to chest pressure at rest lasting up to 10-15 minutes nearly daily for past 2 months. There is associated difficulty breathing when this occurs. He is currently pain free.    PMH:    Past Medical History  Diagnosis Date  . Borderline diabetes   . Hypertension   . Hypercholesteremia   . Heart murmur   . GI bleed   . Ulcer     PEPTIC ULCER DZ...DR. Ewing Schlein  . BPH (benign prostatic hypertrophy)   . Arthritis     OSTEO OF KNEE  . Aortic stenosis 05/27/12    Severe to critical    PSH:    Past Surgical History  Procedure Date  . Appendectomy     ALLERGIES:   Review of patient's allergies indicates no known allergies.  Prior to Admit Meds:   Prescriptions prior to admission  Medication Sig Dispense Refill  . aspirin 325 MG EC tablet Take 325 mg by mouth daily as needed. For pain      . diltiazem (TIAZAC) 240 MG 24 hr capsule Take 240 mg by mouth daily.      . simvastatin (ZOCOR) 20 MG tablet Take 10 mg by mouth every evening.       . Famotidine (PEPCID PO) Take 1 tablet by mouth daily.      Marland Kitchen glucose blood test strip 1 each by Other route as directed. ONE TOUCH ULTRA      . triamterene-hydrochlorothiazide (MAXZIDE-25) 37.5-25 MG per tablet Take 1 tablet by mouth daily.       Family HX:   No family history on file. Social HX:    History   Social History  . Marital Status: Married    Spouse Name: N/A    Number of Children: N/A  . Years of Education: N/A   Occupational History  . Not on file.   Social History Main Topics    . Smoking status: Never Smoker   . Smokeless tobacco: Not on file  . Alcohol Use: No  . Drug Use: No  . Sexually Active:    Other Topics Concern  . Not on file   Social History Narrative  . No narrative on file     ROS:  no major complaints other than trouble getting around and weakness.  Physical Exam:  BP 138/70 mmHg  HR 60  Skin is clear with no cyanosis  Neck reveals no bilateral transmitted bruits  Chest is clear to auscultation and percussion  Abdomen soft with audible bruit  Extremities with 2 + femorals  Neuro is grossly intact    Labs:   Lab Results  Component Value Date   WBC 8.3 11/10/2011   HGB 12.3* 11/10/2011   HCT 35.4* 11/10/2011   MCV 85.5 11/10/2011   PLT 173 11/10/2011   Please see the office notes attached for recent labs.   Radiology:  Please see outpatient notes  EKG:  None available  ASSESSMENT:  1. Severe/critical aortic stenoisis  2. Severe CAD with critical left main and in retrospect  ischemic pain at rest.  3. Calcified aorta  Plan:  1. Admit for further consideration of option and titration of therapy. 2. Dr. Laneta Simmers will be contacted for input about the patients options. 3. IV heparin  [name] 09/16/2012 2:18 PM

## 2012-09-16 NOTE — CV Procedure (Signed)
     Diagnostic Cardiac Catheterization Report  [name]  78 y.o.  male 14-May-1924  Procedure Date:09/16/2012 Referring Physician:  Evelene Croon, MD  Primary Cardiologist: [name], III, MD   PROCEDURE:  Left heart catheterization with selective coronary angiography, left ventriculogram.  INDICATIONS:  Severe aortic stenosis undergoing a left and right heart cath to document pulmonary artery pressures and coronary anatomy.  The risks, benefits, and details of the procedure were explained to the patient.  The patient verbalized understanding and wanted to proceed.  Informed written consent was obtained.  PROCEDURE TECHNIQUE:  After Xylocaine anesthesia a 5 French right femoral artery and 7 French right femoral vein sheaths were placed in the right femoral artery with a single anterior needle wall stick.   Coronary angiography was done using a left and right 4 cm 5 French Judkins catheters.  Left ventriculography was not done.    CONTRAST:  Total of 50 cc.  COMPLICATIONS:  None.    HEMODYNAMICS:  Aortic pressure was 127/55 mmHg; LV pressure was not recorded; LVEDP not recorded.  There is no severe aortic stenosis based on echo data; right atrial mean 6 mmHg; right ventricular pressure 34/4 mmHg; main pulmonary artery pressure 31/13 mmHg; pulmonary capillary wedge pressure 14 mmHg.  ANGIOGRAPHIC DATA:   The left main coronary artery is heavily calcified and 99% segmental ostium to bifurcation stenosis with TIMI grade 3 flow.  The left anterior descending artery is heavily calcified with hazy 50% stenosis in the very proximal vessel and 50% stenosis in the first diagonal. The LAD is otherwise large.  The left circumflex artery is is basically one obtuse marginal branch or a ramus branch that is severely diseased proximally. I was unable to demonstrate any AV groove circumflex vasculature to  The right coronary artery is 80% mid RCA 50% mid to distal RCA the vessel is heavily  calcified. The right coronary is dominant, giving the PDA and 3 left ventricular and to mid lateral wall branches.  LEFT VENTRICULOGRAM:  Not performed  IMPRESSIONS:   1. Severe coronary disease with 95+ percent ostial to distal left main, moderate proximal LAD disease, and high-grade stenosis in the relatively small circumflex. Severe mid RCA disease.   2. No severe calcific aortic stenosis  3. Calcified ascending and arch aorta  4. Normal pulmonary artery pressures  5. Left ventriculography not performed     RECOMMENDATION:  1. Will speak with Dr. Laneta Simmers concerning the patient.Marland Kitchen

## 2012-09-16 NOTE — Progress Notes (Signed)
Transferred to [address] 28 via stretcher and monitor.  Report called to [facility].

## 2012-09-16 NOTE — Progress Notes (Signed)
ANTICOAGULATION CONSULT NOTE - Initial Consult  Pharmacy Consult for Heparin (no bolus) Indication: chest pain/ACS  No Known Allergies  Patient Measurements: Weight = 81 kg  Medical History: Past Medical History  Diagnosis Date  . Borderline diabetes   . Hypertension   . Hypercholesteremia   . Heart murmur   . GI bleed   . Ulcer     PEPTIC ULCER DZ...DR. Ewing Schlein  . BPH (benign prostatic hypertrophy)   . Arthritis     OSTEO OF KNEE  . Aortic stenosis 05/27/12    Severe to critical   Assessment: Known severe aortic stenosis by echo. Underwent cath this AM as prelude to AVR. Found to have severe calcified left and right coronary arteries with segmental 98+% left main, and severe mid RCA. Upon questioning the patient post procedure, he admits to chest pressure at rest lasting up to 10-15 minutes nearly daily for past 2 months. There is associated difficulty breathing when this occurs. He is currently pain free.  Starting IV heparin as options are considered.  Dr. Laneta Simmers has been consulted  Goal of Therapy:  Heparin level 0.3-0.7 units/ml Monitor platelets by anticoagulation protocol: Yes   Plan:  1) No bolus 2) Heparin drip at 1350 units / hr 3) Heparin level 8 hours after heparin starts 4) Daily heparin level, CBC  Thank you. Okey Regal, PharmD [phone]  09/16/2012,3:51 PM

## 2012-09-16 NOTE — Progress Notes (Signed)
Bedrest begins @ 1350.  Tegaderm dressing applied by Venda Rodes to right groin site, site level 0.

## 2012-09-16 NOTE — OR Nursing (Signed)
Dr Smith at bedside to discuss results and treatment plan with pt and family 

## 2012-09-16 NOTE — H&P (Signed)
The patient has had no change in his clinical status since the attached office notes. He is undergoing preoperative evaluation for aortic valve replacement by Dr. Rexanne Mano. We are performing left and right heart catheterization to have all data necessary to be able to proceed with his definitive procedure. The patient understands the nature of the procedure and the risks of stroke, death, myocardial infarction, kidney failure, limb ischemia, allergy, among others. He is willing to proceed to

## 2012-09-16 NOTE — OR Nursing (Signed)
Meal served 

## 2012-09-16 NOTE — OR Nursing (Signed)
Report called to Jeannine Kitten, RN on [address]

## 2012-09-17 ENCOUNTER — Inpatient Hospital Stay (HOSPITAL_COMMUNITY): Payer: Medicare Other

## 2012-09-17 ENCOUNTER — Encounter (HOSPITAL_COMMUNITY): Payer: Self-pay | Admitting: Anesthesiology

## 2012-09-17 ENCOUNTER — Inpatient Hospital Stay (HOSPITAL_COMMUNITY): Payer: Medicare Other | Admitting: Anesthesiology

## 2012-09-17 ENCOUNTER — Encounter (HOSPITAL_COMMUNITY)
Admission: AD | Disposition: A | Discharge status: Home / Self Care | Payer: Self-pay | Source: Ambulatory Visit | Attending: Cardiothoracic Surgery

## 2012-09-17 DIAGNOSIS — I2510 Atherosclerotic heart disease of native coronary artery without angina pectoris: Secondary | ICD-10-CM

## 2012-09-17 DIAGNOSIS — I359 Nonrheumatic aortic valve disorder, unspecified: Secondary | ICD-10-CM

## 2012-09-17 HISTORY — PX: CORONARY ARTERY BYPASS GRAFT: SHX141

## 2012-09-17 HISTORY — PX: CARDIAC VALVE REPLACEMENT: SHX585

## 2012-09-17 HISTORY — PX: AORTIC VALVE REPLACEMENT: SHX41

## 2012-09-17 HISTORY — PX: INTRAOPERATIVE TRANSESOPHAGEAL ECHOCARDIOGRAM: SHX5062

## 2012-09-17 LAB — POCT I-STAT 3, ART BLOOD GAS (G3+)
Acid-base deficit: 1 mmol/L (ref 0.0–2.0)
Acid-base deficit: 4 mmol/L — ABNORMAL HIGH (ref 0.0–2.0)
Acid-base deficit: 4 mmol/L — ABNORMAL HIGH (ref 0.0–2.0)
Bicarbonate: 20.8 mEq/L (ref 20.0–24.0)
O2 Saturation: 100 %
O2 Saturation: 100 %
O2 Saturation: 90 %
O2 Saturation: 97 %
Patient temperature: 36.8
Patient temperature: 36.3
TCO2: 22 mmol/L (ref 0–100)
TCO2: 23 mmol/L (ref 0–100)
pCO2 arterial: 37.4 mmHg (ref 35.0–45.0)
pCO2 arterial: 48.3 mmHg — ABNORMAL HIGH (ref 35.0–45.0)
pO2, Arterial: 342 mmHg — ABNORMAL HIGH (ref 80.0–100.0)

## 2012-09-17 LAB — URINALYSIS, ROUTINE W REFLEX MICROSCOPIC
Bilirubin Urine: NEGATIVE
Glucose, UA: NEGATIVE mg/dL
Hgb urine dipstick: NEGATIVE
Ketones, ur: NEGATIVE mg/dL
Leukocytes, UA: NEGATIVE
Nitrite: NEGATIVE
Protein, ur: NEGATIVE mg/dL
Specific Gravity, Urine: 1.014 (ref 1.005–1.030)
Urobilinogen, UA: 0.2 mg/dL (ref 0.0–1.0)
pH: 6.5 (ref 5.0–8.0)

## 2012-09-17 LAB — POCT I-STAT 4, (NA,K, GLUC, HGB,HCT)
Glucose, Bld: 106 mg/dL — ABNORMAL HIGH (ref 70–99)
Glucose, Bld: 108 mg/dL — ABNORMAL HIGH (ref 70–99)
Glucose, Bld: 155 mg/dL — ABNORMAL HIGH (ref 70–99)
Glucose, Bld: 107 mg/dL — ABNORMAL HIGH (ref 70–99)
HCT: 27 % — ABNORMAL LOW (ref 39.0–52.0)
HCT: 22 % — ABNORMAL LOW (ref 39.0–52.0)
HCT: 28 % — ABNORMAL LOW (ref 39.0–52.0)
Hemoglobin: 7.5 g/dL — ABNORMAL LOW (ref 13.0–17.0)
Hemoglobin: 8.5 g/dL — ABNORMAL LOW (ref 13.0–17.0)
Hemoglobin: 9.5 g/dL — ABNORMAL LOW (ref 13.0–17.0)
Potassium: 3.6 mEq/L (ref 3.5–5.1)
Potassium: 3.3 mEq/L — ABNORMAL LOW (ref 3.5–5.1)
Potassium: 3.9 mEq/L (ref 3.5–5.1)
Potassium: 3.5 mEq/L (ref 3.5–5.1)
Sodium: 138 mEq/L (ref 135–145)
Sodium: 138 mEq/L (ref 135–145)
Sodium: 139 mEq/L (ref 135–145)
Sodium: 141 mEq/L (ref 135–145)

## 2012-09-17 LAB — POCT I-STAT, CHEM 8
Glucose, Bld: 144 mg/dL — ABNORMAL HIGH (ref 70–99)
HCT: 22 % — ABNORMAL LOW (ref 39.0–52.0)
Hemoglobin: 7.5 g/dL — ABNORMAL LOW (ref 13.0–17.0)
Potassium: 4.2 mEq/L (ref 3.5–5.1)
TCO2: 20 mmol/L (ref 0–100)

## 2012-09-17 LAB — CBC
HCT: 27.1 % — ABNORMAL LOW (ref 39.0–52.0)
HCT: 23.5 % — ABNORMAL LOW (ref 39.0–52.0)
Hemoglobin: 9.4 g/dL — ABNORMAL LOW (ref 13.0–17.0)
Hemoglobin: 8.4 g/dL — ABNORMAL LOW (ref 13.0–17.0)
MCH: 29.9 pg (ref 26.0–34.0)
MCHC: 34.7 g/dL (ref 30.0–36.0)
MCHC: 35.7 g/dL (ref 30.0–36.0)
MCV: 83.6 fL (ref 78.0–100.0)
Platelets: 68 10*3/uL — ABNORMAL LOW (ref 150–400)
RBC: 2.81 MIL/uL — ABNORMAL LOW (ref 4.22–5.81)
RDW: 14.6 % (ref 11.5–15.5)
WBC: 9.8 10*3/uL (ref 4.0–10.5)

## 2012-09-17 LAB — GLUCOSE, CAPILLARY
Glucose-Capillary: 91 mg/dL (ref 70–99)
Glucose-Capillary: 105 mg/dL — ABNORMAL HIGH (ref 70–99)
Glucose-Capillary: 90 mg/dL (ref 70–99)
Glucose-Capillary: 145 mg/dL — ABNORMAL HIGH (ref 70–99)

## 2012-09-17 LAB — HEMOGLOBIN AND HEMATOCRIT, BLOOD
HCT: 26.5 % — ABNORMAL LOW (ref 39.0–52.0)
Hemoglobin: 9.4 g/dL — ABNORMAL LOW (ref 13.0–17.0)

## 2012-09-17 LAB — BASIC METABOLIC PANEL
BUN: 20 mg/dL (ref 6–23)
Calcium: 10.3 mg/dL (ref 8.4–10.5)
Creatinine, Ser: 1.17 mg/dL (ref 0.50–1.35)
GFR calc Af Amer: 63 mL/min — ABNORMAL LOW (ref >90)

## 2012-09-17 LAB — PROTIME-INR: INR: 2 — ABNORMAL HIGH (ref 0.00–1.49)

## 2012-09-17 LAB — PLATELET COUNT: Platelets: 139 10*3/uL — ABNORMAL LOW (ref 150–400)

## 2012-09-17 LAB — APTT: aPTT: 39 seconds — ABNORMAL HIGH (ref 24–37)

## 2012-09-17 LAB — CREATININE, SERUM
Creatinine, Ser: 0.89 mg/dL (ref 0.50–1.35)
GFR calc Af Amer: 87 mL/min — ABNORMAL LOW (ref >90)
GFR calc non Af Amer: 75 mL/min — ABNORMAL LOW (ref >90)

## 2012-09-17 LAB — HEMOGLOBIN A1C
Hgb A1c MFr Bld: 6.3 % — ABNORMAL HIGH (ref <5.7)
Mean Plasma Glucose: 134 mg/dL — ABNORMAL HIGH (ref <117)

## 2012-09-17 LAB — MAGNESIUM: Magnesium: 2.8 mg/dL — ABNORMAL HIGH (ref 1.5–2.5)

## 2012-09-17 SURGERY — CORONARY ARTERY BYPASS GRAFTING (CABG)
Anesthesia: General | Site: Chest | Wound class: Clean

## 2012-09-17 MED ORDER — SODIUM CHLORIDE 0.9 % IV SOLN
10.0000 g | INTRAVENOUS | Status: DC | PRN
  Administered 2012-09-17: 5 g/h via INTRAVENOUS

## 2012-09-17 MED ORDER — SODIUM CHLORIDE 0.9 % IJ SOLN
3.0000 mL | Freq: Two times a day (BID) | INTRAMUSCULAR | Status: DC
  Administered 2012-09-18 – 2012-09-20 (×6): 3 mL via INTRAVENOUS

## 2012-09-17 MED ORDER — ACETAMINOPHEN 500 MG PO TABS
1000.0000 mg | ORAL_TABLET | Freq: Four times a day (QID) | ORAL | Status: DC
  Administered 2012-09-18 – 2012-09-21 (×12): 1000 mg via ORAL
  Filled 2012-09-17 (×18): qty 2

## 2012-09-17 MED ORDER — MIDAZOLAM HCL 5 MG/ML IJ SOLN: INTRAMUSCULAR | Status: DC | PRN

## 2012-09-17 MED ORDER — DOCUSATE SODIUM 100 MG PO CAPS
200.0000 mg | ORAL_CAPSULE | Freq: Every day | ORAL | Status: DC
  Administered 2012-09-18 – 2012-09-19 (×2): 200 mg via ORAL
  Filled 2012-09-17 (×2): qty 2

## 2012-09-17 MED ORDER — HEMOSTATIC AGENTS (NO CHARGE) OPTIME
TOPICAL | Status: DC | PRN
  Administered 2012-09-17: 1 via TOPICAL

## 2012-09-17 MED ORDER — ALBUMIN HUMAN 5 % IV SOLN
250.0000 mL | INTRAVENOUS | Status: AC | PRN
  Administered 2012-09-17 (×2): 250 mL via INTRAVENOUS

## 2012-09-17 MED ORDER — ALBUMIN HUMAN 5 % IV SOLN
INTRAVENOUS | Status: DC | PRN
  Administered 2012-09-17 (×2): via INTRAVENOUS

## 2012-09-17 MED ORDER — DEXMEDETOMIDINE HCL IN NACL 200 MCG/50ML IV SOLN
INTRAVENOUS | Status: DC | PRN
  Administered 2012-09-17: .2 ug/kg/h via INTRAVENOUS

## 2012-09-17 MED ORDER — DOPAMINE-DEXTROSE 3.2-5 MG/ML-% IV SOLN: 0.0000 ug/kg/min | INTRAVENOUS | Status: DC

## 2012-09-17 MED ORDER — INSULIN REGULAR BOLUS VIA INFUSION
0.0000 [IU] | Freq: Three times a day (TID) | INTRAVENOUS | Status: DC
  Administered 2012-09-17: 0 [IU] via INTRAVENOUS
  Filled 2012-09-17: qty 10

## 2012-09-17 MED ORDER — ASPIRIN 81 MG PO CHEW: 324.0000 mg | CHEWABLE_TABLET | Freq: Every day | ORAL | Status: DC

## 2012-09-17 MED ORDER — FAMOTIDINE IN NACL 20-0.9 MG/50ML-% IV SOLN
20.0000 mg | Freq: Two times a day (BID) | INTRAVENOUS | Status: AC
  Administered 2012-09-17: 20 mg via INTRAVENOUS
  Filled 2012-09-17: qty 50

## 2012-09-17 MED ORDER — DEXTROSE 5 % IV SOLN
INTRAVENOUS | Status: DC | PRN
  Administered 2012-09-17: 08:00:00 via INTRAVENOUS

## 2012-09-17 MED ORDER — CHLORHEXIDINE GLUCONATE CLOTH 2 % EX PADS
6.0000 | MEDICATED_PAD | Freq: Once | CUTANEOUS | Status: AC
  Administered 2012-09-16: 6 via TOPICAL

## 2012-09-17 MED ORDER — SODIUM CHLORIDE 0.9 % IJ SOLN
OROMUCOSAL | Status: DC | PRN
  Administered 2012-09-17 (×3): via TOPICAL

## 2012-09-17 MED ORDER — OXYCODONE HCL 5 MG PO TABS
5.0000 mg | ORAL_TABLET | ORAL | Status: DC | PRN
  Administered 2012-09-18: 5 mg via ORAL
  Filled 2012-09-17: qty 1

## 2012-09-17 MED ORDER — METOPROLOL TARTRATE 1 MG/ML IV SOLN: 2.5000 mg | INTRAVENOUS | Status: DC | PRN

## 2012-09-17 MED ORDER — SODIUM CHLORIDE 0.9 % IV SOLN
100.0000 [IU] | INTRAVENOUS | Status: DC | PRN
  Administered 2012-09-17: 1.4 [IU]/h via INTRAVENOUS

## 2012-09-17 MED ORDER — VECURONIUM BROMIDE 10 MG IV SOLR
INTRAVENOUS | Status: DC | PRN
  Administered 2012-09-17 (×2): 5 mg via INTRAVENOUS

## 2012-09-17 MED ORDER — PANTOPRAZOLE SODIUM 40 MG PO TBEC
40.0000 mg | DELAYED_RELEASE_TABLET | Freq: Every day | ORAL | Status: DC
Start: 2012-09-19 — End: 2012-09-21
  Administered 2012-09-19 – 2012-09-20 (×2): 40 mg via ORAL
  Filled 2012-09-17 (×2): qty 1

## 2012-09-17 MED ORDER — 0.9 % SODIUM CHLORIDE (POUR BTL) OPTIME
TOPICAL | Status: DC | PRN
  Administered 2012-09-17: 6000 mL

## 2012-09-17 MED ORDER — BISACODYL 10 MG RE SUPP: 10.0000 mg | Freq: Every day | RECTAL | Status: DC

## 2012-09-17 MED ORDER — SODIUM CHLORIDE 0.9 % IV SOLN: INTRAVENOUS | Status: DC

## 2012-09-17 MED ORDER — METOPROLOL TARTRATE 12.5 MG HALF TABLET
12.5000 mg | ORAL_TABLET | Freq: Two times a day (BID) | ORAL | Status: DC
  Administered 2012-09-18 (×2): 12.5 mg via ORAL
  Filled 2012-09-17 (×5): qty 1

## 2012-09-17 MED ORDER — MIDAZOLAM HCL 5 MG/5ML IJ SOLN
INTRAMUSCULAR | Status: DC | PRN
  Administered 2012-09-17: 2 mg via INTRAVENOUS
  Administered 2012-09-17: 0.5 mg via INTRAVENOUS
  Administered 2012-09-17: 4 mg via INTRAVENOUS
  Administered 2012-09-17: 0.5 mg via INTRAVENOUS
  Administered 2012-09-17: 4 mg via INTRAVENOUS

## 2012-09-17 MED ORDER — MIDAZOLAM HCL 5 MG/ML IJ SOLN
INTRAMUSCULAR | Status: DC | PRN
  Administered 2012-09-17: 4 mg via INTRAVENOUS

## 2012-09-17 MED ORDER — PROPOFOL 10 MG/ML IV BOLUS
INTRAVENOUS | Status: DC | PRN
  Administered 2012-09-17: 100 mg via INTRAVENOUS
  Administered 2012-09-17: 50 mg via INTRAVENOUS

## 2012-09-17 MED ORDER — ROCURONIUM BROMIDE 100 MG/10ML IV SOLN
INTRAVENOUS | Status: DC | PRN
  Administered 2012-09-17: 40 mg via INTRAVENOUS
  Administered 2012-09-17: 60 mg via INTRAVENOUS
  Administered 2012-09-17: 50 mg via INTRAVENOUS

## 2012-09-17 MED ORDER — LIDOCAINE HCL (CARDIAC) 20 MG/ML IV SOLN
INTRAVENOUS | Status: DC | PRN
  Administered 2012-09-17: 50 mg via INTRAVENOUS

## 2012-09-17 MED ORDER — LACTATED RINGERS IV SOLN
INTRAVENOUS | Status: DC | PRN
  Administered 2012-09-17: 08:00:00 via INTRAVENOUS

## 2012-09-17 MED ORDER — CHLORHEXIDINE GLUCONATE CLOTH 2 % EX PADS: 6.0000 | MEDICATED_PAD | Freq: Every day | CUTANEOUS | Status: DC

## 2012-09-17 MED ORDER — DEXMEDETOMIDINE HCL IN NACL 200 MCG/50ML IV SOLN
0.1000 ug/kg/h | INTRAVENOUS | Status: DC
  Administered 2012-09-17: 0.2 ug/kg/h via INTRAVENOUS
  Filled 2012-09-17: qty 50

## 2012-09-17 MED ORDER — ASPIRIN EC 325 MG PO TBEC
325.0000 mg | DELAYED_RELEASE_TABLET | Freq: Every day | ORAL | Status: DC
  Administered 2012-09-18 – 2012-09-20 (×3): 325 mg via ORAL
  Filled 2012-09-17 (×4): qty 1

## 2012-09-17 MED ORDER — MAGNESIUM SULFATE 40 MG/ML IJ SOLN
INTRAMUSCULAR | Status: AC
  Filled 2012-09-17: qty 100

## 2012-09-17 MED ORDER — LACTATED RINGERS IV SOLN: 500.0000 mL | Freq: Once | INTRAVENOUS | Status: AC | PRN

## 2012-09-17 MED ORDER — MAGNESIUM SULFATE 40 MG/ML IJ SOLN
4.0000 g | Freq: Once | INTRAMUSCULAR | Status: AC
  Administered 2012-09-17: 4 g via INTRAVENOUS

## 2012-09-17 MED ORDER — DEXTROSE 5 % IV SOLN
INTRAVENOUS | Status: DC | PRN
  Administered 2012-09-17 (×2): via INTRAVENOUS

## 2012-09-17 MED ORDER — ACETAMINOPHEN 10 MG/ML IV SOLN
1000.0000 mg | Freq: Once | INTRAVENOUS | Status: AC
  Administered 2012-09-17: 1000 mg via INTRAVENOUS
  Filled 2012-09-17: qty 100

## 2012-09-17 MED ORDER — PHENYLEPHRINE HCL 10 MG/ML IJ SOLN
20.0000 mg | INTRAVENOUS | Status: DC | PRN
  Administered 2012-09-17: 25 ug/min via INTRAVENOUS

## 2012-09-17 MED ORDER — MORPHINE SULFATE 2 MG/ML IJ SOLN
1.0000 mg | INTRAMUSCULAR | Status: AC | PRN
  Administered 2012-09-17 – 2012-09-18 (×3): 2 mg via INTRAVENOUS
  Filled 2012-09-17: qty 1

## 2012-09-17 MED ORDER — MORPHINE SULFATE 2 MG/ML IJ SOLN
2.0000 mg | INTRAMUSCULAR | Status: DC | PRN
  Administered 2012-09-18 (×2): 2 mg via INTRAVENOUS
  Filled 2012-09-17 (×4): qty 1

## 2012-09-17 MED ORDER — SODIUM CHLORIDE 0.9 % IJ SOLN: 3.0000 mL | INTRAMUSCULAR | Status: DC | PRN

## 2012-09-17 MED ORDER — METOPROLOL TARTRATE 25 MG/10 ML ORAL SUSPENSION
12.5000 mg | Freq: Two times a day (BID) | ORAL | Status: DC
  Filled 2012-09-17 (×5): qty 5

## 2012-09-17 MED ORDER — HEPARIN SODIUM (PORCINE) 1000 UNIT/ML IJ SOLN
INTRAMUSCULAR | Status: DC | PRN
  Administered 2012-09-17: 27000 [IU] via INTRAVENOUS

## 2012-09-17 MED ORDER — SODIUM CHLORIDE 0.9 % IV SOLN
INTRAVENOUS | Status: DC
  Filled 2012-09-17: qty 1

## 2012-09-17 MED ORDER — BISACODYL 5 MG PO TBEC
10.0000 mg | DELAYED_RELEASE_TABLET | Freq: Every day | ORAL | Status: DC
  Administered 2012-09-18 – 2012-09-19 (×2): 10 mg via ORAL
  Filled 2012-09-17 (×2): qty 2

## 2012-09-17 MED ORDER — LACTATED RINGERS IV SOLN: INTRAVENOUS | Status: DC

## 2012-09-17 MED ORDER — CHLORHEXIDINE GLUCONATE CLOTH 2 % EX PADS
6.0000 | MEDICATED_PAD | Freq: Once | CUTANEOUS | Status: AC
  Administered 2012-09-17: 6 via TOPICAL

## 2012-09-17 MED ORDER — PHENYLEPHRINE HCL 10 MG/ML IJ SOLN
30.0000 ug/min | INTRAMUSCULAR | Status: DC
  Filled 2012-09-17: qty 2

## 2012-09-17 MED ORDER — SODIUM CHLORIDE 0.45 % IV SOLN: INTRAVENOUS | Status: DC

## 2012-09-17 MED ORDER — ARTIFICIAL TEARS OP OINT
TOPICAL_OINTMENT | OPHTHALMIC | Status: DC | PRN
  Administered 2012-09-17: 1 via OPHTHALMIC

## 2012-09-17 MED ORDER — PHENYLEPHRINE HCL 10 MG/ML IJ SOLN
0.0000 ug/min | INTRAVENOUS | Status: DC
  Filled 2012-09-17: qty 2

## 2012-09-17 MED ORDER — SODIUM CHLORIDE 0.9 % IV SOLN
INTRAVENOUS | Status: DC | PRN
  Administered 2012-09-17: 16:00:00 via INTRAVENOUS

## 2012-09-17 MED ORDER — LACTATED RINGERS IV SOLN
INTRAVENOUS | Status: DC | PRN
  Administered 2012-09-17 (×2): via INTRAVENOUS

## 2012-09-17 MED ORDER — FENTANYL CITRATE 0.05 MG/ML IJ SOLN
INTRAMUSCULAR | Status: DC | PRN
  Administered 2012-09-17 (×2): 250 ug via INTRAVENOUS
  Administered 2012-09-17: 500 ug via INTRAVENOUS

## 2012-09-17 MED ORDER — MILRINONE IN DEXTROSE 20 MG/100ML IV SOLN
0.1250 ug/kg/min | INTRAVENOUS | Status: DC
  Filled 2012-09-17: qty 100

## 2012-09-17 MED ORDER — POTASSIUM CHLORIDE 10 MEQ/50ML IV SOLN
10.0000 meq | INTRAVENOUS | Status: AC
  Administered 2012-09-17 (×3): 10 meq via INTRAVENOUS

## 2012-09-17 MED ORDER — DEXTROSE 5 % IV SOLN
1.5000 g | Freq: Two times a day (BID) | INTRAVENOUS | Status: AC
  Administered 2012-09-17 – 2012-09-19 (×4): 1.5 g via INTRAVENOUS
  Filled 2012-09-17 (×5): qty 1.5

## 2012-09-17 MED ORDER — VANCOMYCIN HCL IN DEXTROSE 1-5 GM/200ML-% IV SOLN
1000.0000 mg | Freq: Once | INTRAVENOUS | Status: AC
  Administered 2012-09-17: 1000 mg via INTRAVENOUS
  Filled 2012-09-17: qty 200

## 2012-09-17 MED ORDER — ACETAMINOPHEN 160 MG/5ML PO SOLN
975.0000 mg | Freq: Four times a day (QID) | ORAL | Status: DC
  Administered 2012-09-17: 975 mg
  Filled 2012-09-17: qty 40.6

## 2012-09-17 MED ORDER — MIDAZOLAM HCL 2 MG/2ML IJ SOLN: 2.0000 mg | INTRAMUSCULAR | Status: DC | PRN

## 2012-09-17 MED ORDER — DOPAMINE-DEXTROSE 1.6-5 MG/ML-% IV SOLN
INTRAVENOUS | Status: DC | PRN
  Administered 2012-09-17: 2.5 ug/kg/min via INTRAVENOUS

## 2012-09-17 MED ORDER — NITROGLYCERIN IN D5W 200-5 MCG/ML-% IV SOLN: 0.0000 ug/min | INTRAVENOUS | Status: DC

## 2012-09-17 MED ORDER — FENTANYL CITRATE 0.05 MG/ML IJ SOLN: INTRAMUSCULAR | Status: DC | PRN

## 2012-09-17 MED ORDER — SODIUM CHLORIDE 0.9 % IV SOLN
250.0000 mL | INTRAVENOUS | Status: DC
  Administered 2012-09-18: 250 mL via INTRAVENOUS

## 2012-09-17 MED ORDER — ONDANSETRON HCL 4 MG/2ML IJ SOLN
4.0000 mg | Freq: Four times a day (QID) | INTRAMUSCULAR | Status: DC | PRN
  Administered 2012-09-17 – 2012-09-18 (×2): 4 mg via INTRAVENOUS
  Filled 2012-09-17 (×2): qty 2

## 2012-09-17 MED ORDER — PROTAMINE SULFATE 10 MG/ML IV SOLN
INTRAVENOUS | Status: DC | PRN
  Administered 2012-09-17: 20 mg via INTRAVENOUS
  Administered 2012-09-17: 230 mg via INTRAVENOUS

## 2012-09-17 MED ORDER — NITROGLYCERIN IN D5W 200-5 MCG/ML-% IV SOLN
INTRAVENOUS | Status: DC | PRN
  Administered 2012-09-17: 16.6 ug/min via INTRAVENOUS

## 2012-09-17 SURGICAL SUPPLY — 131 items
ADAPTER CARDIO PERF ANTE/RETRO (ADAPTER) ×3 IMPLANT
APPLICATOR COTTON TIP 6IN STRL (MISCELLANEOUS) IMPLANT
ATTRACTOMAT 16X20 MAGNETIC DRP (DRAPES) ×3 IMPLANT
BAG DECANTER FOR FLEXI CONT (MISCELLANEOUS) ×3 IMPLANT
BANDAGE ELASTIC 4 VELCRO ST LF (GAUZE/BANDAGES/DRESSINGS) ×3 IMPLANT
BANDAGE ELASTIC 6 VELCRO ST LF (GAUZE/BANDAGES/DRESSINGS) ×3 IMPLANT
BANDAGE GAUZE ELAST BULKY 4 IN (GAUZE/BANDAGES/DRESSINGS) ×3 IMPLANT
BLADE STERNUM SYSTEM 6 (BLADE) ×3 IMPLANT
BLADE SURG 11 STRL SS (BLADE) ×3 IMPLANT
BLADE SURG 15 STRL LF DISP TIS (BLADE) ×2 IMPLANT
BLADE SURG 15 STRL SS (BLADE) ×1
BLADE SURG ROTATE 9660 (MISCELLANEOUS) IMPLANT
BOOT SUTURE AID YELLOW STND (SUTURE) ×3 IMPLANT
CANISTER SUCTION 2500CC (MISCELLANEOUS) ×3 IMPLANT
CANN PRFSN .5XCNCT 15X34-48 (MISCELLANEOUS) ×2
CANNULA AORTIC HI-FLOW 6.5M20F (CANNULA) ×3 IMPLANT
CANNULA GUNDRY RCSP 15FR (MISCELLANEOUS) IMPLANT
CANNULA PRFSN .5XCNCT 15X34-48 (MISCELLANEOUS) ×2 IMPLANT
CANNULA VEN 2 STAGE (MISCELLANEOUS) ×1
CANNULA VENOUS DUAL 32/40 (CANNULA) IMPLANT
CATH CPB KIT GERHARDT (MISCELLANEOUS) ×3 IMPLANT
CATH HEART VENT LEFT (CATHETERS) ×2 IMPLANT
CATH RETROPLEGIA CORONARY 14FR (CATHETERS) ×3 IMPLANT
CATH THORACIC 28FR (CATHETERS) ×3 IMPLANT
CATH THORACIC 36FR (CATHETERS) IMPLANT
CATH THORACIC 36FR RT ANG (CATHETERS) IMPLANT
CATH/SQUID NICHOLS JEHLE COR (CATHETERS) IMPLANT
CLIP TI MEDIUM 24 (CLIP) IMPLANT
CLIP TI WIDE RED SMALL 24 (CLIP) IMPLANT
CLOTH BEACON ORANGE TIMEOUT ST (SAFETY) ×3 IMPLANT
CONN ST 1/4X3/8  BEN (MISCELLANEOUS) ×1
CONN ST 1/4X3/8 BEN (MISCELLANEOUS) ×2 IMPLANT
CONT SPEC 4OZ CLIKSEAL STRL BL (MISCELLANEOUS) ×3 IMPLANT
COVER SURGICAL LIGHT HANDLE (MISCELLANEOUS) ×3 IMPLANT
CRADLE DONUT ADULT HEAD (MISCELLANEOUS) ×3 IMPLANT
DRAIN CHANNEL 28F RND 3/8 FF (WOUND CARE) ×3 IMPLANT
DRAIN CHANNEL 32F RND 10.7 FF (WOUND CARE) IMPLANT
DRAPE CARDIOVASCULAR INCISE (DRAPES) ×1
DRAPE SLUSH MACHINE 52X66 (DRAPES) IMPLANT
DRAPE SLUSH/WARMER DISC (DRAPES) ×3 IMPLANT
DRAPE SRG 135X102X78XABS (DRAPES) ×2 IMPLANT
DRSG COVADERM 4X14 (GAUZE/BANDAGES/DRESSINGS) ×3 IMPLANT
ELECT BLADE 4.0 EZ CLEAN MEGAD (MISCELLANEOUS) ×3
ELECT CAUTERY BLADE 6.4 (BLADE) ×3 IMPLANT
ELECT REM PT RETURN 9FT ADLT (ELECTROSURGICAL) ×6
ELECTRODE BLDE 4.0 EZ CLN MEGD (MISCELLANEOUS) ×2 IMPLANT
ELECTRODE REM PT RTRN 9FT ADLT (ELECTROSURGICAL) ×4 IMPLANT
GLOVE BIO SURGEON STRL SZ 6 (GLOVE) ×9 IMPLANT
GLOVE BIO SURGEON STRL SZ 6.5 (GLOVE) ×21 IMPLANT
GLOVE BIO SURGEON STRL SZ7 (GLOVE) IMPLANT
GLOVE BIO SURGEON STRL SZ7.5 (GLOVE) IMPLANT
GLOVE BIOGEL PI IND STRL 6 (GLOVE) ×4 IMPLANT
GLOVE BIOGEL PI IND STRL 6.5 (GLOVE) ×6 IMPLANT
GLOVE BIOGEL PI IND STRL 7.0 (GLOVE) IMPLANT
GLOVE BIOGEL PI INDICATOR 6 (GLOVE) ×2
GLOVE BIOGEL PI INDICATOR 6.5 (GLOVE) ×3
GLOVE BIOGEL PI INDICATOR 7.0 (GLOVE)
GOWN STRL NON-REIN LRG LVL3 (GOWN DISPOSABLE) ×27 IMPLANT
HEMOSTAT POWDER SURGIFOAM 1G (HEMOSTASIS) ×9 IMPLANT
HEMOSTAT SURGICEL 2X14 (HEMOSTASIS) ×3 IMPLANT
INSERT FOGARTY 61MM (MISCELLANEOUS) IMPLANT
INSERT FOGARTY XLG (MISCELLANEOUS) IMPLANT
KIT BASIN OR (CUSTOM PROCEDURE TRAY) ×3 IMPLANT
KIT ROOM TURNOVER OR (KITS) ×3 IMPLANT
KIT SUCTION CATH 14FR (SUCTIONS) ×9 IMPLANT
KIT VASOVIEW W/TROCAR VH 2000 (KITS) ×3 IMPLANT
LEAD PACING MYOCARDI (MISCELLANEOUS) ×3 IMPLANT
LINE VENT (MISCELLANEOUS) ×3 IMPLANT
MARKER GRAFT CORONARY BYPASS (MISCELLANEOUS) ×9 IMPLANT
NEEDLE 18GX1X1/2 (RX/OR ONLY) (NEEDLE) ×3 IMPLANT
NS IRRIG 1000ML POUR BTL (IV SOLUTION) ×18 IMPLANT
PACK OPEN HEART (CUSTOM PROCEDURE TRAY) ×3 IMPLANT
PAD ARMBOARD 7.5X6 YLW CONV (MISCELLANEOUS) ×6 IMPLANT
PENCIL BUTTON HOLSTER BLD 10FT (ELECTRODE) ×3 IMPLANT
PUNCH AORTIC ROTATE 4.0MM (MISCELLANEOUS) ×3 IMPLANT
PUNCH AORTIC ROTATE 4.5MM 8IN (MISCELLANEOUS) IMPLANT
PUNCH AORTIC ROTATE 5MM 8IN (MISCELLANEOUS) IMPLANT
SET CARDIOPLEGIA MPS 5001102 (MISCELLANEOUS) ×3 IMPLANT
SOLUTION ANTI FOG 6CC (MISCELLANEOUS) ×3 IMPLANT
SPONGE GAUZE 4X4 12PLY (GAUZE/BANDAGES/DRESSINGS) ×6 IMPLANT
SPONGE LAP 18X18 X RAY DECT (DISPOSABLE) ×6 IMPLANT
SPONGE LAP 4X18 X RAY DECT (DISPOSABLE) IMPLANT
SUT BONE WAX W31G (SUTURE) ×3 IMPLANT
SUT ETHIBON 2 0 V 52N 30 (SUTURE) ×12 IMPLANT
SUT MNCRL AB 4-0 PS2 18 (SUTURE) IMPLANT
SUT PROLENE 3 0 RB 1 (SUTURE) ×6 IMPLANT
SUT PROLENE 3 0 SH 1 (SUTURE) ×3 IMPLANT
SUT PROLENE 3 0 SH DA (SUTURE) ×3 IMPLANT
SUT PROLENE 3 0 SH1 36 (SUTURE) ×3 IMPLANT
SUT PROLENE 4 0 RB 1 (SUTURE) ×3
SUT PROLENE 4 0 SH DA (SUTURE) IMPLANT
SUT PROLENE 4 0 TF (SUTURE) ×6 IMPLANT
SUT PROLENE 4-0 RB1 .5 CRCL 36 (SUTURE) ×6 IMPLANT
SUT PROLENE 5 0 C 1 36 (SUTURE) IMPLANT
SUT PROLENE 6 0 C 1 30 (SUTURE) ×6 IMPLANT
SUT PROLENE 6 0 CC (SUTURE) ×9 IMPLANT
SUT PROLENE 7 0 BV 1 (SUTURE) ×3 IMPLANT
SUT PROLENE 7 0 BV1 MDA (SUTURE) ×3 IMPLANT
SUT PROLENE 7.0 RB 3 (SUTURE) IMPLANT
SUT PROLENE 8 0 BV175 6 (SUTURE) ×3 IMPLANT
SUT SILK  1 MH (SUTURE)
SUT SILK 1 MH (SUTURE) IMPLANT
SUT SILK 2 0 SH CR/8 (SUTURE) IMPLANT
SUT SILK 3 0 SH CR/8 (SUTURE) IMPLANT
SUT STEEL 6MS V (SUTURE) ×6 IMPLANT
SUT STEEL STERNAL CCS#1 18IN (SUTURE) IMPLANT
SUT STEEL SZ 6 DBL 3X14 BALL (SUTURE) ×3 IMPLANT
SUT VIC AB 1 CTX 18 (SUTURE) ×6 IMPLANT
SUT VIC AB 1 CTX 36 (SUTURE)
SUT VIC AB 1 CTX36XBRD ANBCTR (SUTURE) IMPLANT
SUT VIC AB 2-0 CT1 27 (SUTURE) ×1
SUT VIC AB 2-0 CT1 TAPERPNT 27 (SUTURE) ×2 IMPLANT
SUT VIC AB 2-0 CTX 27 (SUTURE) IMPLANT
SUT VIC AB 3-0 SH 27 (SUTURE)
SUT VIC AB 3-0 SH 27X BRD (SUTURE) IMPLANT
SUT VIC AB 3-0 X1 27 (SUTURE) IMPLANT
SUT VICRYL 4-0 PS2 18IN ABS (SUTURE) ×9 IMPLANT
SUTURE E-PAK OPEN HEART (SUTURE) ×3 IMPLANT
SYSTEM SAHARA CHEST DRAIN ATS (WOUND CARE) ×3 IMPLANT
TAPE CLOTH SURG 4X10 WHT LF (GAUZE/BANDAGES/DRESSINGS) ×3 IMPLANT
TAPE PAPER 2X10 WHT MICROPORE (GAUZE/BANDAGES/DRESSINGS) ×3 IMPLANT
TOWEL OR 17X24 6PK STRL BLUE (TOWEL DISPOSABLE) ×6 IMPLANT
TOWEL OR 17X26 10 PK STRL BLUE (TOWEL DISPOSABLE) ×6 IMPLANT
TRAY FOLEY IC TEMP SENS 14FR (CATHETERS) ×3 IMPLANT
TUBE FEEDING 8FR 16IN STR KANG (MISCELLANEOUS) ×3 IMPLANT
TUBE SUCT INTRACARD DLP 20F (MISCELLANEOUS) ×3 IMPLANT
TUBING INSUFFLATION 10FT LAP (TUBING) ×3 IMPLANT
UNDERPAD 30X30 INCONTINENT (UNDERPADS AND DIAPERS) ×3 IMPLANT
VALVE MAGNA EASE AORTIC 23MM (Prosthesis & Implant Heart) ×3 IMPLANT
VENT LEFT HEART 12002 (CATHETERS) ×3
WATER STERILE IRR 1000ML POUR (IV SOLUTION) ×6 IMPLANT

## 2012-09-17 NOTE — Preoperative (Signed)
Beta Blockers   Reason not to administer Beta Blockers:Not Applicable 

## 2012-09-17 NOTE — Progress Notes (Signed)
No further stridor at this time.  Pt awake alert and oriented sat 98% on 2L nasal cannula.

## 2012-09-17 NOTE — Brief Op Note (Addendum)
09/16/2012 - 09/17/2012  2:23 PM  PATIENT:  [name]  78 y.o. male  PRE-OPERATIVE DIAGNOSIS:  Critical Left Main and Critical AF  POST-OPERATIVE DIAGNOSIS:  Critical Left Main and Critical AF  PROCEDURE:  Procedure(s) (LRB) with comments:  CORONARY ARTERY BYPASS GRAFTING x3 -LIMA to LAD -SVG to OM1 -SVG to RCA  AORTIC VALVE REPLACEMENT  -23 [employer] Ease Tissue Valve  ENDOSCOPIC SAPHENOUS VEIN HARVEST RIGHT LEG  INTRAOPERATIVE TRANSESOPHAGEAL ECHOCARDIOGRAM (N/A)  SURGEON:  Surgeon(s) and Role:    * [name], MD - Primary  PHYSICIAN ASSISTANT: Erin Barrett PA-C  ANESTHESIA:   general  EBL:  Total I/O In: 2050 [I.V.:2050] Out: 550 [Urine:550]  BLOOD ADMINISTERED:2 units PRBC and  CELLSAVER  DRAINS: Left pleural chest tubes, mediastinal chest drains   LOCAL MEDICATIONS USED:  NONE  SPECIMEN:  Source of Specimen:  Aortic Vavle  DISPOSITION OF SPECIMEN:  PATHOLOGY  COUNTS:  YES  TOURNIQUET:  * No tourniquets in log *  DICTATION: .Dragon Dictation  PLAN OF CARE: Admit to inpatient   PATIENT DISPOSITION:  ICU - intubated and hemodynamically stable.   Delay start of Pharmacological VTE agent (>24hrs) due to surgical blood loss or risk of bleeding: yes

## 2012-09-17 NOTE — Progress Notes (Signed)
  Echocardiogram Echocardiogram Transesophageal has been performed.  Georgian Co 09/17/2012, 10:57 AM

## 2012-09-17 NOTE — Progress Notes (Signed)
T[facility] BRIEF SICU PROGRESS NOTE  Day of Surgery  S/P Procedure(s) (LRB): CORONARY ARTERY BYPASS GRAFTING (CABG) (N/A) INTRAOPERATIVE TRANSESOPHAGEAL ECHOCARDIOGRAM (N/A) AORTIC VALVE REPLACEMENT (AVR) (N/A)   Sedated on vent AAI paced w/ stable hemodynamics Chest tube output 200 mL total since OR UOP adequate Labs okay w/ Hgb 9.4  Plan: Continue routine early postop  [name],[name] 09/17/2012 6:07 PM

## 2012-09-17 NOTE — Procedures (Signed)
Extubation Procedure Note  Patient Details:   Name: [name] DOB: 1923-12-26 MRN: [id number]   Airway Documentation:     Evaluation  O2 sats: stable throughout Complications: No apparent complications Patient did tolerate procedure well. Bilateral Breath Sounds: Clear;Diminished   Yes Extubated PT to 3L [place]. Parameters TV (7.73L) NIF (-20) PT was able to speak and was stable with sats of 100%  [name], Duane Lope 09/17/2012, 10:22 PM

## 2012-09-17 NOTE — Progress Notes (Signed)
Going to CABG and AVR this AM. Appreciate Dr. Dennie Maizes prompt response in this high risk gentleman,.

## 2012-09-17 NOTE — Progress Notes (Signed)
Placed on venti mask due to mouth breathing, sats 97% on 35% venti mask.

## 2012-09-17 NOTE — Transfer of Care (Signed)
Immediate Anesthesia Transfer of Care Note  Patient: [name]  Procedure(s) Performed: Procedure(s) (LRB) with comments: CORONARY ARTERY BYPASS GRAFTING (CABG) (N/A) - CABG x three,  using left internal mammary artery and right leg greater saphenous vein harvested endoscopically INTRAOPERATIVE TRANSESOPHAGEAL ECHOCARDIOGRAM (N/A) AORTIC VALVE REPLACEMENT (AVR) (N/A)  Patient Location: PACU and SICU  Anesthesia Type:General  Level of Consciousness: unresponsive  Airway & Oxygen Therapy: Patient remains intubated per anesthesia plan  Post-op Assessment: Post -op Vital signs reviewed and stable  Post vital signs: Reviewed and stable  Complications: No apparent anesthesia complications

## 2012-09-17 NOTE — Anesthesia Postprocedure Evaluation (Signed)
  Anesthesia Post-op Note  Patient: [name]  Procedure(s) Performed: Procedure(s) (LRB) with comments: CORONARY ARTERY BYPASS GRAFTING (CABG) (N/A) - CABG x three,  using left internal mammary artery and right leg greater saphenous vein harvested endoscopically INTRAOPERATIVE TRANSESOPHAGEAL ECHOCARDIOGRAM (N/A) AORTIC VALVE REPLACEMENT (AVR) (N/A)  Patient Location: PACU and SICU  Anesthesia Type:General  Level of Consciousness: sedated  Airway and Oxygen Therapy: Patient remains intubated per anesthesia plan and Patient placed on Ventilator (see vital sign flow sheet for setting)  Post-op Pain: none  Post-op Assessment: Post-op Vital signs reviewed, Patient's Cardiovascular Status Stable, Respiratory Function Stable, Patent Airway and No signs of Nausea or vomiting  Post-op Vital Signs: Reviewed and stable  Complications: No apparent anesthesia complications

## 2012-09-17 NOTE — Anesthesia Preprocedure Evaluation (Addendum)
Anesthesia Evaluation  Patient identified by MRN, date of birth, ID band Patient awake    Reviewed: Allergy & Precautions, H&P , NPO status , Patient's Chart, lab work & pertinent test results, reviewed documented beta blocker date and time   Airway Mallampati: II TM Distance: >3 FB Neck ROM: full    Dental  (+) Dental Advisory Given and Teeth Intact,    Pulmonary neg pulmonary ROS,  breath sounds clear to auscultation  Pulmonary exam normal       Cardiovascular Exercise Tolerance: Poor hypertension, On Medications and Pt. on medications + angina with exertion + CAD + Valvular Problems/Murmurs AS Rhythm:regular Rate:Normal     Neuro/Psych negative neurological ROS  negative psych ROS   GI/Hepatic negative GI ROS, Neg liver ROS,   Endo/Other  negative endocrine ROS  Renal/GU negative Renal ROS  negative genitourinary   Musculoskeletal  (+) Arthritis -, Osteoarthritis,    Abdominal   Peds  Hematology negative hematology ROS (+)   Anesthesia Other Findings See surgeon's H&P / Dental Advisory given  Reproductive/Obstetrics negative OB ROS                        Anesthesia Physical Anesthesia Plan  ASA: IV  Anesthesia Plan: General   Post-op Pain Management:    Induction: Intravenous  Airway Management Planned: Oral ETT  Additional Equipment: Arterial line, CVP, PA Cath, TEE and Ultrasound Guidance Line Placement  Intra-op Plan:   Post-operative Plan: Post-operative intubation/ventilation  Informed Consent: I have reviewed the patients History and Physical, chart, labs and discussed the procedure including the risks, benefits and alternatives for the proposed anesthesia with the patient or authorized representative who has indicated his/her understanding and acceptance.   Dental Advisory Given  Plan Discussed with: CRNA and Surgeon  Anesthesia Plan Comments:         Anesthesia  Quick Evaluation

## 2012-09-17 NOTE — Progress Notes (Signed)
Pt extubated as per protocol. Pt complains of sore throat and demonstrates stridor, and continuously coughing. Upon assessment of his oral cavity and throat I found that his uvula was very swollen approximately 4 times normal size. Dr. Cornelius Moras called and I also called the on call nurse anesthetist for advise.  Dr. Cornelius Moras advised to elevate [name], keep NPO and monitor very closely throughout the night.  [name]'s sats are 100% on 2L nasal cannula. Will continue to closely monitor.

## 2012-09-17 NOTE — Progress Notes (Signed)
VASCULAR LAB PRELIMINARY  PRELIMINARY  PRELIMINARY  PRELIMINARY  Pre-op Cardiac Surgery  Carotid Findings:  Bilateral:  No evidence of hemodynamically significant internal carotid artery stenosis.   Vertebral artery flow is antegrade.      Upper Extremity Right Left  Brachial Pressures    Radial Waveforms    Ulnar Waveforms    Palmar Arch (Allen's Test)     Findings:      Lower  Extremity Right Left  Dorsalis Pedis    Anterior Tibial    Posterior Tibial    Ankle/Brachial Indices      Findings:     [name], RVT 09/17/2012, 9:32 AM

## 2012-09-17 NOTE — Anesthesia Procedure Notes (Signed)
Procedure Name: Intubation Date/Time: 09/17/2012 10:02 AM Performed by: Tyrone Nine Pre-anesthesia Checklist: Patient identified, Timeout performed, Emergency Drugs available, Suction available and Patient being monitored Patient Re-evaluated:Patient Re-evaluated prior to inductionOxygen Delivery Method: Circle system utilized Preoxygenation: Pre-oxygenation with 100% oxygen Intubation Type: IV induction Ventilation: Mask ventilation without difficulty and Oral airway inserted - appropriate to patient size Laryngoscope Size: Mac and 3 Grade View: Grade III Tube type: Oral Tube size: 8.5 mm Number of attempts: 2 Airway Equipment and Method: Stylet and Video-laryngoscopy Placement Confirmation: ETT inserted through vocal cords under direct vision,  positive ETCO2,  breath sounds checked- equal and bilateral and CO2 detector Secured at: 23 cm Tube secured with: Tape Dental Injury: Teeth and Oropharynx as per pre-operative assessment  Difficulty Due To: Difficulty was unanticipated, Difficult Airway- due to reduced neck mobility and Difficult Airway- due to immobile epiglottis Comments: Laryngoscopy X 2 w/ Grade 3 view.  Glide scope intubation w/o difficulty

## 2012-09-18 ENCOUNTER — Inpatient Hospital Stay (HOSPITAL_COMMUNITY): Payer: Medicare Other

## 2012-09-18 ENCOUNTER — Encounter (HOSPITAL_COMMUNITY): Payer: Self-pay | Admitting: *Deleted

## 2012-09-18 DIAGNOSIS — I359 Nonrheumatic aortic valve disorder, unspecified: Principal | ICD-10-CM

## 2012-09-18 LAB — MAGNESIUM: Magnesium: 2.3 mg/dL (ref 1.5–2.5)

## 2012-09-18 LAB — POCT I-STAT, CHEM 8
BUN: 19 mg/dL (ref 6–23)
Chloride: 103 mEq/L (ref 96–112)
Creatinine, Ser: 1.1 mg/dL (ref 0.50–1.35)
Potassium: 4.1 mEq/L (ref 3.5–5.1)
Sodium: 139 mEq/L (ref 135–145)

## 2012-09-18 LAB — GLUCOSE, CAPILLARY
Glucose-Capillary: 106 mg/dL — ABNORMAL HIGH (ref 70–99)
Glucose-Capillary: 114 mg/dL — ABNORMAL HIGH (ref 70–99)
Glucose-Capillary: 117 mg/dL — ABNORMAL HIGH (ref 70–99)
Glucose-Capillary: 120 mg/dL — ABNORMAL HIGH (ref 70–99)
Glucose-Capillary: 126 mg/dL — ABNORMAL HIGH (ref 70–99)
Glucose-Capillary: 127 mg/dL — ABNORMAL HIGH (ref 70–99)
Glucose-Capillary: 146 mg/dL — ABNORMAL HIGH (ref 70–99)
Glucose-Capillary: 165 mg/dL — ABNORMAL HIGH (ref 70–99)
Glucose-Capillary: 50 mg/dL — ABNORMAL LOW (ref 70–99)

## 2012-09-18 LAB — CBC
HCT: 26.4 % — ABNORMAL LOW (ref 39.0–52.0)
Hemoglobin: 8.3 g/dL — ABNORMAL LOW (ref 13.0–17.0)
Hemoglobin: 9 g/dL — ABNORMAL LOW (ref 13.0–17.0)
MCH: 30 pg (ref 26.0–34.0)
MCH: 29.4 pg (ref 26.0–34.0)
MCHC: 34.1 g/dL (ref 30.0–36.0)
RBC: 2.77 MIL/uL — ABNORMAL LOW (ref 4.22–5.81)
RDW: 15.3 % (ref 11.5–15.5)

## 2012-09-18 LAB — BASIC METABOLIC PANEL
CO2: 23 mEq/L (ref 19–32)
Glucose, Bld: 113 mg/dL — ABNORMAL HIGH (ref 70–99)
Potassium: 3.8 mEq/L (ref 3.5–5.1)
Sodium: 139 mEq/L (ref 135–145)

## 2012-09-18 LAB — CREATININE, SERUM: GFR calc non Af Amer: 55 mL/min — ABNORMAL LOW (ref >90)

## 2012-09-18 MED ORDER — AMIODARONE HCL IN DEXTROSE 360-4.14 MG/200ML-% IV SOLN
INTRAVENOUS | Status: AC
  Administered 2012-09-18: 200 mL
  Filled 2012-09-18: qty 200

## 2012-09-18 MED ORDER — DEXTROSE 50 % IV SOLN
INTRAVENOUS | Status: AC
  Administered 2012-09-18: 20 mL
  Filled 2012-09-18: qty 50

## 2012-09-18 MED ORDER — AMIODARONE LOAD VIA INFUSION
150.0000 mg | Freq: Once | INTRAVENOUS | Status: AC
  Administered 2012-09-18: 150 mg via INTRAVENOUS
  Filled 2012-09-18: qty 83.34

## 2012-09-18 MED ORDER — FUROSEMIDE 10 MG/ML IJ SOLN
20.0000 mg | Freq: Four times a day (QID) | INTRAMUSCULAR | Status: AC
  Administered 2012-09-18 (×3): 20 mg via INTRAVENOUS
  Filled 2012-09-18 (×3): qty 2

## 2012-09-18 MED ORDER — INSULIN ASPART 100 UNIT/ML ~~LOC~~ SOLN
0.0000 [IU] | SUBCUTANEOUS | Status: DC
  Administered 2012-09-18: 4 [IU] via SUBCUTANEOUS
  Administered 2012-09-18 (×3): 2 [IU] via SUBCUTANEOUS
  Administered 2012-09-19: 4 [IU] via SUBCUTANEOUS
  Administered 2012-09-19 (×3): 2 [IU] via SUBCUTANEOUS
  Administered 2012-09-20: 4 [IU] via SUBCUTANEOUS
  Administered 2012-09-20: 2 [IU] via SUBCUTANEOUS
  Administered 2012-09-21: 4 [IU] via SUBCUTANEOUS

## 2012-09-18 MED ORDER — AMIODARONE HCL IN DEXTROSE 360-4.14 MG/200ML-% IV SOLN
60.0000 mg/h | INTRAVENOUS | Status: DC
  Administered 2012-09-18: 30.06 mg/h via INTRAVENOUS
  Administered 2012-09-18: 60 mg/h via INTRAVENOUS
  Filled 2012-09-18 (×7): qty 200

## 2012-09-18 MED ORDER — INSULIN GLARGINE 100 UNIT/ML ~~LOC~~ SOLN
20.0000 [IU] | Freq: Every day | SUBCUTANEOUS | Status: DC
  Administered 2012-09-18: 20 [IU] via SUBCUTANEOUS

## 2012-09-18 NOTE — Progress Notes (Signed)
   CARDIOTHORACIC SURGERY PROGRESS NOTE   R1 Day Post-Op Procedure(s) (LRB): CORONARY ARTERY BYPASS GRAFTING (CABG) (N/A) INTRAOPERATIVE TRANSESOPHAGEAL ECHOCARDIOGRAM (N/A) AORTIC VALVE REPLACEMENT (AVR) (N/A)  Subjective: Doing very well.  Mild soreness in chest.  Breathing comfortably.  Objective: Vital signs: BP Readings from Last 1 Encounters:  09/18/12 118/48   Pulse Readings from Last 1 Encounters:  09/18/12 79   Resp Readings from Last 1 Encounters:  09/18/12 13   Temp Readings from Last 1 Encounters:  09/18/12 96.6 F (35.9 C) Core (Comment)    Hemodynamics: PAP: (15-31)/(8-15) 28/13 mmHg CO:  [3 L/min-5.2 L/min] 5.2 L/min CI:  [1.5 L/min/m2-2.7 L/min/m2] 2.7 L/min/m2  Physical Exam:  Rhythm:   Sinus - AAI paced  Breath sounds: Scattered rhonchi  Heart sounds:  RRR  Incisions:  Dressings dry  Abdomen:  Soft, non-distended, non-tender  Extremities:  Warm, well-perfused   Intake/Output from previous day: 12/13 0701 - 12/14 0700 In: 8169.3 [I.V.:6082.3; Blood:655; IV Piggyback:1432] Out: 1610 [Urine:2195; Blood:1100; Chest Tube:510] Intake/Output this shift: Total I/O In: 55.8 [I.V.:55.8] Out: 35 [Urine:35]  Lab Results:  [facility] 09/18/12 0408 09/17/12 2157 09/17/12 2152  WBC 11.7* -- 9.8  HGB 8.3* 7.5* --  HCT 23.3* 22.0* --  PLT 75* -- 68*   BMET:  Basename 09/18/12 0408 09/17/12 2157 09/17/12 0610  NA 139 139 --  K 3.8 4.2 --  CL 109 107 --  CO2 23 -- 25  GLUCOSE 113* 144* --  BUN 17 17 --  CREATININE 0.93 1.00 --  CALCIUM 8.4 -- 10.3    CBG (last 3)   Basename 09/17/12 2356 09/17/12 2254 09/17/12 2151  GLUCAP 80 137* 145*   ABG    Component Value Date/Time   PHART 7.374 09/17/2012 2153   HCO3 20.8 09/17/2012 2153   TCO2 20 09/17/2012 2157   ACIDBASEDEF 4.0* 09/17/2012 2153   O2SAT 97.0 09/17/2012 2153   CXR: Mild bibasilar atelectasis and pulmonary vasc congestion  Assessment/Plan: S/P Procedure(s) (LRB): CORONARY  ARTERY BYPASS GRAFTING (CABG) (N/A) INTRAOPERATIVE TRANSESOPHAGEAL ECHOCARDIOGRAM (N/A) AORTIC VALVE REPLACEMENT (AVR) (N/A)  Doing well POD1 Expected post op acute blood loss anemia, mild, stable Expected post op volume excess, mild   Mobilize  D/C tubes and lines  Diuresis   OWEN,CLARENCE H 09/18/2012 9:31 AM

## 2012-09-18 NOTE — Progress Notes (Signed)
Patient went into Atrial Fib, with a rate of 70s-80s.  Dr. Cornelius Moras paged and orders received to give IV bolus of Amiodarone, followed by a continuous drip per protocol.  Will continue to monitor.  Keitha Butte, RN

## 2012-09-18 NOTE — Progress Notes (Signed)
CRITICAL VALUE ALERT   Critical value received:  CBGHypoglycemic Event  CBG: 50  Treatment:  20 ml d50 bristojet  Symptoms:  none  Follow-up CBG: Time:0515 CBG Result:126  Possible Reasons for Event:  poor p.o. Intake, iv insulin gtt  Comments/MD notified: Covered as per glucostabilizer protocol and TCTS protocol

## 2012-09-18 NOTE — Progress Notes (Signed)
TCTS BRIEF SICU PROGRESS NOTE  1 Day Post-Op  S/P Procedure(s) (LRB): CORONARY ARTERY BYPASS GRAFTING (CABG) (N/A) INTRAOPERATIVE TRANSESOPHAGEAL ECHOCARDIOGRAM (N/A) AORTIC VALVE REPLACEMENT (AVR) (N/A)   Stable day but now in Afib w/ controlled ventricular rate BP 90-110 systolic O2 sats 63% on 2 L/min UOP 20 mL/hr w/ minimal response to lasix Hgb 9.0 stable, creatinine 1.1  Plan: Continue current plan  OWEN,CLARENCE H 09/18/2012 5:31 PM

## 2012-09-19 ENCOUNTER — Inpatient Hospital Stay (HOSPITAL_COMMUNITY): Payer: Medicare Other

## 2012-09-19 LAB — GLUCOSE, CAPILLARY
Glucose-Capillary: 134 mg/dL — ABNORMAL HIGH (ref 70–99)
Glucose-Capillary: 111 mg/dL — ABNORMAL HIGH (ref 70–99)
Glucose-Capillary: 129 mg/dL — ABNORMAL HIGH (ref 70–99)

## 2012-09-19 LAB — BASIC METABOLIC PANEL
Calcium: 9.1 mg/dL (ref 8.4–10.5)
GFR calc non Af Amer: 47 mL/min — ABNORMAL LOW (ref >90)
Glucose, Bld: 136 mg/dL — ABNORMAL HIGH (ref 70–99)
Potassium: 3.7 mEq/L (ref 3.5–5.1)
Sodium: 136 mEq/L (ref 135–145)

## 2012-09-19 LAB — CBC
Hemoglobin: 8.2 g/dL — ABNORMAL LOW (ref 13.0–17.0)
MCH: 29.7 pg (ref 26.0–34.0)
Platelets: 79 10*3/uL — ABNORMAL LOW (ref 150–400)
RBC: 2.76 MIL/uL — ABNORMAL LOW (ref 4.22–5.81)
WBC: 11.1 10*3/uL — ABNORMAL HIGH (ref 4.0–10.5)

## 2012-09-19 MED ORDER — POTASSIUM CHLORIDE 10 MEQ/50ML IV SOLN
10.0000 meq | INTRAVENOUS | Status: AC
Start: 2012-09-19 — End: 2012-09-19
  Administered 2012-09-19 (×2): 10 meq via INTRAVENOUS
  Filled 2012-09-19: qty 50

## 2012-09-19 MED ORDER — MORPHINE SULFATE 2 MG/ML IJ SOLN: 1.0000 mg | INTRAMUSCULAR | Status: DC | PRN

## 2012-09-19 MED ORDER — AMIODARONE HCL 200 MG PO TABS
200.0000 mg | ORAL_TABLET | Freq: Two times a day (BID) | ORAL | Status: DC
  Administered 2012-09-19 – 2012-09-22 (×8): 200 mg via ORAL
  Filled 2012-09-19 (×9): qty 1

## 2012-09-19 MED ORDER — FUROSEMIDE 10 MG/ML IJ SOLN
20.0000 mg | Freq: Four times a day (QID) | INTRAMUSCULAR | Status: AC
  Administered 2012-09-19 (×3): 20 mg via INTRAVENOUS
  Filled 2012-09-19 (×3): qty 2

## 2012-09-19 MED ORDER — POTASSIUM CHLORIDE 10 MEQ/50ML IV SOLN
INTRAVENOUS | Status: AC
  Administered 2012-09-19: 10 meq
  Filled 2012-09-19: qty 50

## 2012-09-19 NOTE — Progress Notes (Signed)
TCTS BRIEF SICU PROGRESS NOTE  2 Days Post-Op  S/P Procedure(s) (LRB): CORONARY ARTERY BYPASS GRAFTING (CABG) (N/A) INTRAOPERATIVE TRANSESOPHAGEAL ECHOCARDIOGRAM (N/A) AORTIC VALVE REPLACEMENT (AVR) (N/A)   Stable day AAI paced BP stable O2 sats 100% on 2 L/min via [place] UOP adequate  Plan: Continue current plan  Azaela Caracci H 09/19/2012 8:08 PM

## 2012-09-19 NOTE — Progress Notes (Signed)
K+= 3.7 and creat= 1.23 w/ urine o/p > 30cc/hr; TCTS KCL protocol initiated.

## 2012-09-19 NOTE — Progress Notes (Signed)
   CARDIOTHORACIC SURGERY PROGRESS NOTE   R2 Days Post-Op Procedure(s) (LRB): CORONARY ARTERY BYPASS GRAFTING (CABG) (N/A) INTRAOPERATIVE TRANSESOPHAGEAL ECHOCARDIOGRAM (N/A) AORTIC VALVE REPLACEMENT (AVR) (N/A)  Subjective: Feels well this morning.  Slept some last night and ambulated around SICU.  Minimal pain.  Converted out of Afib overnight on amiodarone drip  Objective: Vital signs: BP Readings from Last 1 Encounters:  09/19/12 112/48   Pulse Readings from Last 1 Encounters:  09/19/12 84   Resp Readings from Last 1 Encounters:  09/19/12 19   Temp Readings from Last 1 Encounters:  09/19/12 97.9 F (36.6 C) Oral    Hemodynamics:    Physical Exam:  Rhythm:   Sinus 50's - now AAI pacing  Breath sounds: clear  Heart sounds:  RRR  Incisions:  Dressings dry  Abdomen:  Soft, non tender  Extremities:  warm   Intake/Output from previous day: 12/14 0701 - 12/15 0700 In: 1913.1 [P.O.:690; I.V.:1069.1; IV Piggyback:154] Out: 880 [Urine:830; Chest Tube:50] Intake/Output this shift: Total I/O In: 334 [P.O.:240; I.V.:94] Out: 25 [Urine:25]  Lab Results:  Basename 09/19/12 0354 09/18/12 1718 09/18/12 1700  WBC 11.1* -- 12.7*  HGB 8.2* 12.6* --  HCT 23.4* 37.0* --  PLT 79* -- 96*   BMET:  Basename 09/19/12 0354 09/18/12 1718 09/18/12 0408  NA 136 139 --  K 3.7 4.1 --  CL 104 103 --  CO2 22 -- 23  GLUCOSE 136* 169* --  BUN 22 19 --  CREATININE 1.32 1.10 --  CALCIUM 9.1 -- 8.4    CBG (last 3)   Basename 09/19/12 0712 09/19/12 0405 09/18/12 2347  GLUCAP 111* 134* 127*   ABG    Component Value Date/Time   PHART 7.374 09/17/2012 2153   HCO3 20.8 09/17/2012 2153   TCO2 20 09/18/2012 1718   ACIDBASEDEF 4.0* 09/17/2012 2153   O2SAT 97.0 09/17/2012 2153   CXR: *RADIOLOGY REPORT*  Clinical Data: Postop cardiac surgery  PORTABLE CHEST - 1 VIEW  Comparison: Chest radiograph 09/18/2012  Findings: Sternotomy wires overlie stable cardiac silhouette.    Interval retraction of Swan-Ganz catheter. Right IJ sheath  remains. There is left lower lobe atelectasis and small effusion  and not changed from prior. The left chest tube has been removed.  No pneumothorax. Right lung is relatively clear.  IMPRESSION:  1. Persistent left lower lobe atelectasis and effusion.  2. No pneumothorax following chest tube removal.  .  Original Report Authenticated By: Genevive Bi, M.D.   Assessment/Plan: S/P Procedure(s) (LRB): CORONARY ARTERY BYPASS GRAFTING (CABG) (N/A) INTRAOPERATIVE TRANSESOPHAGEAL ECHOCARDIOGRAM (N/A) AORTIC VALVE REPLACEMENT (AVR) (N/A)  Doing well POD2 Postop afib, now NSR on amiodarone Expected post op acute blood loss anemia, mild, stable Expected post op volume excess, mild, diuresing   Mobilize  Diuresis  Convert amiodarone to oral  Watch anemia   OWEN,CLARENCE H 09/19/2012 10:42 AM

## 2012-09-19 NOTE — Progress Notes (Signed)
Pt EKG rhythm noted to decline to slow AFib with rate of 35-40 bpm; Pt alert with appropriate mentation, BP 102/39; Amiodarone drip paused and lab check revealed K+ of 3.7; TCTS KCL protocol initiated an epicardial pacer intiated at VVI rate of 80, mA 10 with appropriate capture, Dr Cornelius Moras to be notified and Pt cont' to monitor and access.

## 2012-09-19 NOTE — Progress Notes (Signed)
Contacted Dr Cornelius Moras r/t Pt decr. in HR to slow AFib, updated on Pt status, lab values, Vitals; MD ordered that amiodarone drip to be conintued at previous rate of 30mg 

## 2012-09-20 ENCOUNTER — Inpatient Hospital Stay (HOSPITAL_COMMUNITY): Payer: Medicare Other

## 2012-09-20 ENCOUNTER — Encounter (HOSPITAL_COMMUNITY): Payer: Self-pay | Admitting: Cardiothoracic Surgery

## 2012-09-20 LAB — TYPE AND SCREEN
ABO/RH(D): O POS
Antibody Screen: NEGATIVE
Unit division: 0
Unit division: 0
Unit division: 0
Unit division: 0
Unit division: 0
Unit division: 0

## 2012-09-20 LAB — CBC
HCT: 21.8 % — ABNORMAL LOW (ref 39.0–52.0)
Hemoglobin: 7.6 g/dL — ABNORMAL LOW (ref 13.0–17.0)
MCH: 29.7 pg (ref 26.0–34.0)
RBC: 2.56 MIL/uL — ABNORMAL LOW (ref 4.22–5.81)

## 2012-09-20 LAB — BASIC METABOLIC PANEL
Chloride: 103 mEq/L (ref 96–112)
GFR calc Af Amer: 49 mL/min — ABNORMAL LOW (ref >90)
GFR calc non Af Amer: 42 mL/min — ABNORMAL LOW (ref >90)
Glucose, Bld: 81 mg/dL (ref 70–99)
Potassium: 3.1 mEq/L — ABNORMAL LOW (ref 3.5–5.1)
Sodium: 134 mEq/L — ABNORMAL LOW (ref 135–145)

## 2012-09-20 LAB — PREPARE RBC (CROSSMATCH)

## 2012-09-20 LAB — GLUCOSE, CAPILLARY
Glucose-Capillary: 78 mg/dL (ref 70–99)
Glucose-Capillary: 87 mg/dL (ref 70–99)
Glucose-Capillary: 124 mg/dL — ABNORMAL HIGH (ref 70–99)

## 2012-09-20 MED ORDER — POTASSIUM CHLORIDE 10 MEQ/50ML IV SOLN
10.0000 meq | INTRAVENOUS | Status: AC
  Administered 2012-09-20 (×3): 10 meq via INTRAVENOUS
  Filled 2012-09-20 (×2): qty 50

## 2012-09-20 MED ORDER — POTASSIUM CHLORIDE 10 MEQ/50ML IV SOLN
INTRAVENOUS | Status: AC
  Administered 2012-09-20: 10 meq via INTRAVENOUS
  Filled 2012-09-20: qty 50

## 2012-09-20 MED ORDER — FUROSEMIDE 10 MG/ML IJ SOLN
40.0000 mg | Freq: Once | INTRAMUSCULAR | Status: AC
  Administered 2012-09-20: 40 mg via INTRAVENOUS
  Filled 2012-09-20: qty 4

## 2012-09-20 MED FILL — Heparin Sodium (Porcine) Inj 1000 Unit/ML: INTRAMUSCULAR | Qty: 30 | Status: AC

## 2012-09-20 MED FILL — Sodium Chloride Irrigation Soln 0.9%: Qty: 3000 | Status: AC

## 2012-09-20 MED FILL — Electrolyte-R (PH 7.4) Solution: INTRAVENOUS | Qty: 4000 | Status: AC

## 2012-09-20 MED FILL — Lidocaine HCl IV Inj 20 MG/ML: INTRAVENOUS | Qty: 5 | Status: AC

## 2012-09-20 MED FILL — Sodium Chloride IV Soln 0.9%: INTRAVENOUS | Qty: 1000 | Status: AC

## 2012-09-20 MED FILL — Sodium Bicarbonate IV Soln 8.4%: INTRAVENOUS | Qty: 50 | Status: AC

## 2012-09-20 MED FILL — Mannitol IV Soln 20%: INTRAVENOUS | Qty: 500 | Status: AC

## 2012-09-20 MED FILL — Heparin Sodium (Porcine) Inj 1000 Unit/ML: INTRAMUSCULAR | Qty: 10 | Status: AC

## 2012-09-20 NOTE — Progress Notes (Signed)
K+= 3.1 and creat= 1.33 w/ urine o/p > 30cc/hr; TCTS KCL protocol initiated with 10 mEq/ 50 cc IV x 3, each over one hour.

## 2012-09-20 NOTE — Progress Notes (Signed)
Patient ID: [name], male   DOB: [birth year], 78 y.o.   MRN: [id number] TCTS DAILY PROGRESS NOTE                   [address]            [place] [id number]          [phone]      3 Days Post-Op Procedure(s) (LRB): CORONARY ARTERY BYPASS GRAFTING (CABG) (N/A) INTRAOPERATIVE TRANSESOPHAGEAL ECHOCARDIOGRAM (N/A) AORTIC VALVE REPLACEMENT (AVR) (N/A)  Total Length of Stay:  LOS: 4 days   Subjective: Awake and alert. Walking in unit with help. This am back afib.  Objective: Vital signs in last 24 hours: Temp:  [98 F (36.7 C)-98.7 F (37.1 C)] 98.6 F (37 C) (12/16 0723) Pulse Rate:  [69-92] 83  (12/16 0800) Cardiac Rhythm:  [-] Atrial fibrillation (12/16 0800) Resp:  [16-25] 21  (12/16 0800) BP: (92-141)/(40-88) 107/45 mmHg (12/16 0800) SpO2:  [91 %-100 %] 97 % (12/16 0800) Weight:  [194 lb 0.1 oz (88 kg)] 194 lb 0.1 oz (88 kg) (12/16 0600)  Filed Weights   09/18/12 0500 09/19/12 0600 09/20/12 0600  Weight: 184 lb 15.5 oz (83.9 kg) 191 lb 12.8 oz (87 kg) 194 lb 0.1 oz (88 kg)    Weight change: 2 lb 3.3 oz (1 kg)   Hemodynamic parameters for last 24 hours:    Intake/Output from previous day: 12/15 0701 - 12/16 0700 In: 2121.1 [P.O.:1620; I.V.:347.1; IV Piggyback:154] Out: 1175 [Urine:1175]  Intake/Output this shift:    Current Meds: Scheduled Meds:   . acetaminophen  1,000 mg Oral Q6H  . amiodarone  200 mg Oral BID PC  . aspirin EC  325 mg Oral Daily  . bisacodyl  10 mg Oral Daily   Or  . bisacodyl  10 mg Rectal Daily  . docusate sodium  200 mg Oral Daily  . insulin aspart  0-24 Units Subcutaneous Q4H  . pantoprazole  40 mg Oral Daily  . simvastatin  10 mg Oral QPM  . sodium chloride  3 mL Intravenous Q12H   Continuous Infusions:   . sodium chloride 10 mL/hr at 09/18/12 0700  . sodium chloride Stopped (09/19/12 2000)   PRN Meds:.metoprolol, morphine injection, ondansetron (ZOFRAN) IV, oxyCODONE, sodium chloride  General  appearance: alert and cooperative Neurologic: intact Heart: irregularly irregular rhythm Lungs: diminished breath sounds bibasilar Abdomen: soft, non-tender; bowel sounds normal; no masses,  no organomegaly Extremities: extremities normal, atraumatic, no cyanosis or edema and Homans sign is negative, no sign of DVT Wound: sternum stable  Lab Results: CBC: Basename 09/20/12 0347 09/19/12 0354  WBC 10.2 11.1*  HGB 7.6* 8.2*  HCT 21.8* 23.4*  PLT 88* 79*   BMET:  Basename 09/20/12 0347 09/19/12 0354  NA 134* 136  K 3.1* 3.7  CL 103 104  CO2 22 22  GLUCOSE 81 136*  BUN 28* 22  CREATININE 1.44* 1.32  CALCIUM 9.0 9.1    PT/INR:  Basename 09/17/12 1634  LABPROT 21.9*  INR 2.00*   Radiology: Dg Chest Port 1 View  09/20/2012  *RADIOLOGY REPORT*  Clinical Data: Cardiac surgery  PORTABLE CHEST - 1 VIEW  Comparison: Yesterday  Findings: Stable appearance of the cardiomediastinal silhouette. Right internal jugular vein introducer sheath stable.  Low lung volumes and bibasilar atelectasis   worse.  No pneumothorax.  Left pleural effusions suspected.  IMPRESSION: Worsening bibasilar atelectasis.   Original Report Authenticated By: Jolaine Click, M.D.  Dg Chest Portable 1 View In Am  09/19/2012  *RADIOLOGY REPORT*  Clinical Data: Postop cardiac surgery  PORTABLE CHEST - 1 VIEW  Comparison: Chest radiograph 09/18/2012  Findings: Sternotomy wires overlie stable cardiac silhouette. Interval retraction of Swan-Ganz catheter.  Right IJ sheath remains.  There is left lower lobe atelectasis and small effusion and not changed from prior.  The left chest tube has been removed. No pneumothorax.  Right lung is relatively clear.    IMPRESSION: 1.  Persistent left lower lobe atelectasis and effusion. 2.  No pneumothorax following chest tube removal.  .   Original Report Authenticated By: Genevive Bi, M.D.      Assessment/Plan: S/P Procedure(s) (LRB): CORONARY ARTERY BYPASS GRAFTING (CABG)  (N/A) INTRAOPERATIVE TRANSESOPHAGEAL ECHOCARDIOGRAM (N/A) AORTIC VALVE REPLACEMENT (AVR) (N/A) Mobilize Diuresis control a fib on cordarone Expected Acute  Blood - loss Anemia HCT 21% will transfuse one unit prbc Thrombocytopenia , avoid heparin for now, if afib continues will need coumadin     Samantha Ragen B 09/20/2012 9:00 AM

## 2012-09-20 NOTE — Progress Notes (Signed)
Up in chair Minimal discomfort  BP 104/55  Pulse 56  Temp 98.6 F (37 C) (Oral)  Resp 18  Ht 5\' 7"  (1.702 m)  Wt 194 lb 0.1 oz (88 kg)  BMI 30.39 kg/m2  SpO2 94%   Intake/Output Summary (Last 24 hours) at 09/20/12 1730 Last data filed at 09/20/12 1400  Gross per 24 hour  Intake   2053 ml  Output   1040 ml  Net   1013 ml   Continue present care

## 2012-09-20 NOTE — Op Note (Signed)
[name], [name] NO.:  [id number]  MEDICAL RECORD NO.:  [id number]  LOCATION:  2315                         FACILITY:  [facility]  PHYSICIAN:  Sheliah Plane, MD    DATE OF BIRTH:  March 10, 1924  DATE OF PROCEDURE:  09/17/2012 DATE OF DISCHARGE:                              OPERATIVE REPORT   PREOPERATIVE DIAGNOSIS:  Critical left main and critical aortic stenosis.  POSTOPERATIVE DIAGNOSIS:  Critical left main and critical aortic stenosis.  SURGICAL PROCEDURE:  Aortic valve replacement with a pericardial tissue valve, [employer] 23 mm  #[id number] and coronary artery bypass grafting x3 with the left internal mammary to the left anterior descending coronary artery, reverse saphenous vein graft to the circumflex coronary artery, and reverse saphenous vein graft to the distal right coronary artery with right leg endo vein harvesting.  SURGEON:  Sheliah Plane, MD  FIRST ASSISTANT:  Lowella Dandy, PA  BRIEF HISTORY:  The patient is an 78 year old male, who has had known aortic stenosis that has been relatively asymptomatic, however, recent echocardiogram confirmed a velocity across his aortic valve of greater than 4 cm/second.  In recent weeks, he has had increasing dyspnea on exertion at the age of 78 with critical aortic stenosis.  At the time of catheterization, he was found to have significant calcification of his mitral valve anulus, some calcification of his ascending aorta and critical left main obstruction of greater than 95% and 70% proximal right obstruction.  The patient remains active and has no other limiting medical problems in spite of his age of 78 years with the very critical left main in the setting of critical aortic stenosis, urgent coronary artery bypass grafting and aortic valve replacement with tissue valve was recommended to the patient who agreed and signed informed consent. The risks and options were discussed with he and his  family in detail. He is willing to proceed and signed informed consent.  DESCRIPTION OF PROCEDURE:  With Swan-Ganz and arterial line monitors placed, the patient underwent general endotracheal anesthesia without incident.  The skin on chest and legs were prepped with Betadine and draped in usual sterile manner.  Using the [employer] endo vein harvesting system, vein was harvested from the right thigh and calf and was of good quality and caliber.  Median sternotomy was performed.  The left internal mammary artery was dissected down as a pedicle graft.  The distal artery was divided and had a good free flow.  Pericardium was opened.  The patient did have evidence of left ventricular hypertrophy. Ascending aorta was of relatively normal size.  He was systemically heparinized.  Ascending aorta was cannulated.  The right atrium was cannulated and aortic root vent cardioplegia needle was introduced into the ascending aorta.  The patient was placed on cardiopulmonary bypass 2.4 liters/minute/meter squared.  Sites of anastomosis were selected and dissected out of the epicardium.  The patient's body temperature was then cooled to 20-32 degrees.  The right superior pulmonary vein vent was placed.  Retrograde cardioplegia catheter was placed.  Aortic crossclamp was applied, 500 mL cold blood potassium cardioplegia was administered antegrade.  Additional cold blood cardioplegia was administered retrograde.  Attention was turned  first to the coronary arteries.  The heart was elevated and the circumflex coronary artery supplying the lateral wall was opened and admitted a 1.5 mm probe. Using a running 7-0 Prolene, distal anastomosis was performed. Additional cold blood cardioplegia was administered down the vein graft. Attention was then turned to the distal right coronary artery, which was opened and admitted a 1.5 mm probe.  Using a running 7-0 Prolene, distal anastomosis was performed.  Additional cold  blood cardioplegia was administered down the vein grafts.  Attention was then turned to the left anterior descending coronary artery.  The LAD was opened in the midportion of the vessel, admitted a 1.5 mm probe distally.  Using a running 8-0 Prolene, left internal mammary artery was anastomosed to left anterior descending coronary artery.  With release of the bulldog on the mammary artery, there was appropriate rise in myocardial septal temperature.  Bulldog was placed back on the mammary artery.  Additional cold blood cardioplegia was administered.  A transverse aortotomy was then performed and the aortic valve was examined.  It was a trileaflet valve with severe calcification.  The valve was excised and was debrided.  Care was taken to remove all loose calcific debris.  The annulus was sized for a 23 pericardial tissue valve, [employer].  A #2 Tycron pledgeted sutures were placed in circumferential manner in the anulus, a total of 16 sutures.  These were used to seat the valve in the anulus which seated well without obstruction of the left or right coronary ostia.  The aortotomy was then closed with horizontal mattress, 4-0 Prolene over felt strips.  With the crossclamp still in place, 2 punch aortotomies were performed and each of the 2 vein grafts were anastomosed to the ascending aorta.  Prior to complete closure of aortotomies, the heart was allowed to passively fill and de-airing maneuvers were carried out.  Warm retrograde cardioplegia was administered.  The aortotomies were completed and aortic crossclamp was removed.  The patient spontaneously converted to a slow sinus rhythm.  Sites of anastomosis were inspected free of bleeding.  The heart was allowed to fill and TEE showed good function of the aortic valve prosthesis.  The retrograde cardioplegia catheter and vent were removed.  He was then ventilated and weaned from cardiopulmonary bypass after rewarmed  to 37 degrees.  Atrial and ventricular pacing wires had been applied and he was DDD paced.  He was decannulated in usual fashion.  Protamine sulfate was administered with operative field hemostatic.  The left pleural tube and a Blake mediastinal drain were left in place.  Pericardium was closed.  Sternum was closed with #6 stainless steel wire, fascia with interrupted 2-0 Vicryl, running 3-0 Vicryl subcutaneous tissue, 4-0 subcuticular stitch in skin edges.  Dry dressings were applied.  Sponge and needle count was reported as correct at completion of procedure.  The patient tolerated the procedure without obvious complication and was transferred to the Surgical Intensive Care Unit for further postoperative care.  The patient did require 2 packed red blood cells from the blood bank because of low starting hematocrit and low hematocrit while on bypass from expected blood loss anemia and preop anemia.     Sheliah Plane, MD     EG/MEDQ  D:  09/19/2012  T:  09/20/2012  Job:  [id number]  cc:   Lyn Records, M.D.

## 2012-09-21 DIAGNOSIS — I4891 Unspecified atrial fibrillation: Secondary | ICD-10-CM

## 2012-09-21 HISTORY — DX: Unspecified atrial fibrillation: I48.91

## 2012-09-21 LAB — BASIC METABOLIC PANEL
BUN: 28 mg/dL — ABNORMAL HIGH (ref 6–23)
CO2: 21 mEq/L (ref 19–32)
Calcium: 8 mg/dL — ABNORMAL LOW (ref 8.4–10.5)
Chloride: 104 mEq/L (ref 96–112)
Creatinine, Ser: 1.21 mg/dL (ref 0.50–1.35)
GFR calc Af Amer: 61 mL/min — ABNORMAL LOW (ref >90)
GFR calc non Af Amer: 52 mL/min — ABNORMAL LOW (ref >90)
Glucose, Bld: 165 mg/dL — ABNORMAL HIGH (ref 70–99)
Potassium: 2.8 mEq/L — ABNORMAL LOW (ref 3.5–5.1)
Sodium: 135 mEq/L (ref 135–145)

## 2012-09-21 LAB — GLUCOSE, CAPILLARY
Glucose-Capillary: 103 mg/dL — ABNORMAL HIGH (ref 70–99)
Glucose-Capillary: 110 mg/dL — ABNORMAL HIGH (ref 70–99)
Glucose-Capillary: 133 mg/dL — ABNORMAL HIGH (ref 70–99)
Glucose-Capillary: 138 mg/dL — ABNORMAL HIGH (ref 70–99)
Glucose-Capillary: 147 mg/dL — ABNORMAL HIGH (ref 70–99)
Glucose-Capillary: 181 mg/dL — ABNORMAL HIGH (ref 70–99)

## 2012-09-21 LAB — CBC
HCT: 23.8 % — ABNORMAL LOW (ref 39.0–52.0)
Hemoglobin: 8.4 g/dL — ABNORMAL LOW (ref 13.0–17.0)
MCH: 30.1 pg (ref 26.0–34.0)
MCHC: 35.3 g/dL (ref 30.0–36.0)
MCV: 85.3 fL (ref 78.0–100.0)
Platelets: 107 10*3/uL — ABNORMAL LOW (ref 150–400)
RBC: 2.79 MIL/uL — ABNORMAL LOW (ref 4.22–5.81)
RDW: 15.1 % (ref 11.5–15.5)
WBC: 8.9 10*3/uL (ref 4.0–10.5)

## 2012-09-21 LAB — TYPE AND SCREEN
ABO/RH(D): O POS
Antibody Screen: NEGATIVE
Unit division: 0

## 2012-09-21 MED ORDER — MAGNESIUM HYDROXIDE 400 MG/5ML PO SUSP: 30.0000 mL | Freq: Every day | ORAL | Status: DC | PRN

## 2012-09-21 MED ORDER — MOVING RIGHT ALONG BOOK
Freq: Once | Status: AC
  Administered 2012-09-21: 09:00:00
  Filled 2012-09-21: qty 1

## 2012-09-21 MED ORDER — POTASSIUM CHLORIDE 10 MEQ/50ML IV SOLN
10.0000 meq | INTRAVENOUS | Status: AC
  Administered 2012-09-21 (×3): 10 meq via INTRAVENOUS
  Filled 2012-09-21: qty 150

## 2012-09-21 MED ORDER — SODIUM CHLORIDE 0.9 % IJ SOLN: 3.0000 mL | INTRAMUSCULAR | Status: DC | PRN

## 2012-09-21 MED ORDER — BISACODYL 5 MG PO TBEC: 10.0000 mg | DELAYED_RELEASE_TABLET | Freq: Every day | ORAL | Status: DC | PRN

## 2012-09-21 MED ORDER — ONDANSETRON HCL 4 MG PO TABS: 4.0000 mg | ORAL_TABLET | Freq: Four times a day (QID) | ORAL | Status: DC | PRN

## 2012-09-21 MED ORDER — TRAMADOL HCL 50 MG PO TABS: 50.0000 mg | ORAL_TABLET | ORAL | Status: DC | PRN

## 2012-09-21 MED ORDER — METOPROLOL TARTRATE 12.5 MG HALF TABLET
12.5000 mg | ORAL_TABLET | Freq: Two times a day (BID) | ORAL | Status: DC
  Administered 2012-09-21 (×2): 12.5 mg via ORAL
  Filled 2012-09-21 (×4): qty 1

## 2012-09-21 MED ORDER — OXYCODONE HCL 5 MG PO TABS: 5.0000 mg | ORAL_TABLET | ORAL | Status: DC | PRN

## 2012-09-21 MED ORDER — BISACODYL 10 MG RE SUPP: 10.0000 mg | Freq: Every day | RECTAL | Status: DC | PRN

## 2012-09-21 MED ORDER — ASPIRIN EC 325 MG PO TBEC
325.0000 mg | DELAYED_RELEASE_TABLET | Freq: Every day | ORAL | Status: DC
  Administered 2012-09-21 – 2012-09-24 (×4): 325 mg via ORAL
  Filled 2012-09-21 (×4): qty 1

## 2012-09-21 MED ORDER — ACETAMINOPHEN 325 MG PO TABS: 650.0000 mg | ORAL_TABLET | Freq: Four times a day (QID) | ORAL | Status: DC | PRN

## 2012-09-21 MED ORDER — POTASSIUM CHLORIDE CRYS ER 20 MEQ PO TBCR
20.0000 meq | EXTENDED_RELEASE_TABLET | Freq: Two times a day (BID) | ORAL | Status: AC
  Administered 2012-09-21 (×2): 20 meq via ORAL
  Filled 2012-09-21: qty 1

## 2012-09-21 MED ORDER — SODIUM CHLORIDE 0.9 % IV SOLN: 250.0000 mL | INTRAVENOUS | Status: DC | PRN

## 2012-09-21 MED ORDER — INSULIN ASPART 100 UNIT/ML ~~LOC~~ SOLN
0.0000 [IU] | Freq: Three times a day (TID) | SUBCUTANEOUS | Status: DC
  Administered 2012-09-21 – 2012-09-22 (×5): 2 [IU] via SUBCUTANEOUS
  Administered 2012-09-22: 4 [IU] via SUBCUTANEOUS

## 2012-09-21 MED ORDER — SODIUM CHLORIDE 0.9 % IJ SOLN
3.0000 mL | Freq: Two times a day (BID) | INTRAMUSCULAR | Status: DC
  Administered 2012-09-21 – 2012-09-23 (×6): 3 mL via INTRAVENOUS

## 2012-09-21 MED ORDER — ONDANSETRON HCL 4 MG/2ML IJ SOLN: 4.0000 mg | Freq: Four times a day (QID) | INTRAMUSCULAR | Status: DC | PRN

## 2012-09-21 MED ORDER — PANTOPRAZOLE SODIUM 40 MG PO TBEC
40.0000 mg | DELAYED_RELEASE_TABLET | Freq: Every day | ORAL | Status: DC
  Administered 2012-09-22 – 2012-09-24 (×3): 40 mg via ORAL
  Filled 2012-09-21 (×3): qty 1

## 2012-09-21 MED ORDER — DOCUSATE SODIUM 100 MG PO CAPS
200.0000 mg | ORAL_CAPSULE | Freq: Every day | ORAL | Status: DC
  Filled 2012-09-21 (×4): qty 2

## 2012-09-21 MED FILL — Magnesium Sulfate Inj 50%: INTRAMUSCULAR | Qty: 10 | Status: AC

## 2012-09-21 MED FILL — Dexmedetomidine HCl IV Soln 200 MCG/2ML: INTRAVENOUS | Qty: 2 | Status: AC

## 2012-09-21 MED FILL — Potassium Chloride Inj 2 mEq/ML: INTRAVENOUS | Qty: 40 | Status: AC

## 2012-09-21 NOTE — Progress Notes (Addendum)
AM potassium 2.8, replacing per TCTS KCL protocol, 3-56meq runs, each over 60 min.

## 2012-09-21 NOTE — Progress Notes (Signed)
Patient ID: [name], male   DOB: 08-15-1924, 78 y.o.   MRN: [id number] TCTS DAILY PROGRESS NOTE                   [address]            [place] [address]          [phone]      4 Days Post-Op Procedure(s) (LRB): CORONARY ARTERY BYPASS GRAFTING (CABG) (N/A) INTRAOPERATIVE TRANSESOPHAGEAL ECHOCARDIOGRAM (N/A) AORTIC VALVE REPLACEMENT (AVR) (N/A)  Total Length of Stay:  LOS: 5 days   Subjective: Up in chair feels better  Objective: Vital signs in last 24 hours: Temp:  [97.9 F (36.6 C)-98.6 F (37 C)] 98.2 F (36.8 C) (12/17 0724) Pulse Rate:  [55-122] 82  (12/17 0700) Cardiac Rhythm:  [-] Atrial fibrillation (12/17 0400) Resp:  [16-25] 21  (12/17 0700) BP: (104-141)/(44-73) 136/53 mmHg (12/17 0700) SpO2:  [89 %-100 %] 93 % (12/17 0700) Weight:  [192 lb 10.9 oz (87.4 kg)] 192 lb 10.9 oz (87.4 kg) (12/17 0700)  Filed Weights   09/19/12 0600 09/20/12 0600 09/21/12 0700  Weight: 191 lb 12.8 oz (87 kg) 194 lb 0.1 oz (88 kg) 192 lb 10.9 oz (87.4 kg)    Weight change: -1 lb 5.2 oz (-0.6 kg)   Hemodynamic parameters for last 24 hours:    Intake/Output from previous day: 12/16 0701 - 12/17 0700 In: 1400 [P.O.:750; I.V.:200; Blood:300; IV Piggyback:150] Out: 1615 [Urine:1615]  Intake/Output this shift:    Current Meds: Scheduled Meds:   . acetaminophen  1,000 mg Oral Q6H  . amiodarone  200 mg Oral BID PC  . aspirin EC  325 mg Oral Daily  . bisacodyl  10 mg Oral Daily   Or  . bisacodyl  10 mg Rectal Daily  . docusate sodium  200 mg Oral Daily  . insulin aspart  0-24 Units Subcutaneous Q4H  . pantoprazole  40 mg Oral Daily  . simvastatin  10 mg Oral QPM  . sodium chloride  3 mL Intravenous Q12H   Continuous Infusions:   . sodium chloride 20 mL/hr at 09/20/12 2000  . sodium chloride Stopped (09/19/12 2000)   PRN Meds:.metoprolol, morphine injection, ondansetron (ZOFRAN) IV, oxyCODONE, sodium chloride  General appearance: alert and  cooperative Neurologic: intact Heart: irregularly irregular rhythm Lungs: clear to auscultation bilaterally and normal percussion bilaterally Abdomen: soft, non-tender; bowel sounds normal; no masses,  no organomegaly Extremities: extremities normal, atraumatic, no cyanosis or edema and Homans sign is negative, no sign of DVT Wound: sternum stable  Lab Results: CBC: Basename 09/21/12 0400 09/20/12 0347  WBC 8.9 10.2  HGB 8.4* 7.6*  HCT 23.8* 21.8*  PLT 107* 88*   BMET:  Basename 09/21/12 0400 09/20/12 0347  NA 135 134*  K 2.8* 3.1*  CL 104 103  CO2 21 22  GLUCOSE 165* 81  BUN 28* 28*  CREATININE 1.21 1.44*  CALCIUM 8.0* 9.0    PT/INR: No results found for this basename: LABPROT,INR in the last 72 hours Radiology: Dg Chest Port 1 View  09/20/2012  *RADIOLOGY REPORT*  Clinical Data: Cardiac surgery  PORTABLE CHEST - 1 VIEW  Comparison: Yesterday  Findings: Stable appearance of the cardiomediastinal silhouette. Right internal jugular vein introducer sheath stable.  Low lung volumes and bibasilar atelectasis   worse.  No pneumothorax.  Left pleural effusions suspected.  IMPRESSION: Worsening bibasilar atelectasis.   Original Report Authenticated By: Jolaine Click, M.D.  Assessment/Plan: S/P Procedure(s) (LRB): CORONARY ARTERY BYPASS GRAFTING (CABG) (N/A) INTRAOPERATIVE TRANSESOPHAGEAL ECHOCARDIOGRAM (N/A) AORTIC VALVE REPLACEMENT (AVR) (N/A) Mobilize Diuresis Plan for transfer to step-down: see transfer orders Intermittent a fib Hypokalemia being replaced Thrombocytopenia improving     Allon Costlow B 09/21/2012 8:09 AM

## 2012-09-21 NOTE — Progress Notes (Signed)
CARDIAC REHAB PHASE I   PRE:  Rate/Rhythm: 90SR  BP:  Supine:   Sitting: 130/64  Standing:    SaO2: 92%RA  MODE:  Ambulation: 350 ft   POST:  Rate/Rhythem: 100SR  BP:  Supine:   Sitting: 136/70  Standing:    SaO2: 97%RA 1410-1453 Pt needs much assistance to get to standing position. Had pt to rock and still needed much assistance to straighten knees and stand. Wife said he had difficulty prior to surgery and now with sternal precautions it is even harder. Once pt up he was able to walk 350 ft with gait belt and rolling walker and asst x 1. Pt likes to walk. Did not have to stop and rest. To recliner with call bell. Wife in room.  Duanne Limerick

## 2012-09-21 NOTE — Care Management Note (Unsigned)
    Page 1 of 1   09/21/2012     3:25:33 PM   CARE MANAGEMENT NOTE 09/21/2012  Patient:  [name], [name]   Account Number:  [id number]  Date Initiated:  09/20/2012  Documentation initiated by:  Avie Arenas  Subjective/Objective Assessment:   Post op Valve and CABG ON 09/17/12.  PTA, PT INDEPENDENT, LIVES WITH SPOUSE.     Action/Plan:   MET WITH PT AND SPOUSE TO DISCUSS DC PLANS.  WIFE TO PROVIDE CARE AT DC.  PT HAS RW AT HOME, IF NEEDED.  WILL FOLLOW FOR HOME NEEDS.   Anticipated DC Date:  09/24/2012   Anticipated DC Plan:  HOME W HOME HEALTH SERVICES      DC Planning Services  CM consult      Choice offered to / List presented to:             Status of service:  In process, will continue to follow Medicare Important Message given?   (If response is "NO", the following Medicare IM given date fields will be blank) Date Medicare IM given:   Date Additional Medicare IM given:    Discharge Disposition:    Per UR Regulation:  Reviewed for med. necessity/level of care/duration of stay  If discussed at Long Length of Stay Meetings, dates discussed:    Comments:  ContactQuandre, Polinski Spouse [phone]   Ritter,[name] Daughter [phone]   [phone]   Tracey,Chris Son [phone]

## 2012-09-21 NOTE — Progress Notes (Signed)
Back in sinus rhythm. No CV complaints. No murmur of AR heard.

## 2012-09-22 ENCOUNTER — Inpatient Hospital Stay (HOSPITAL_COMMUNITY): Payer: Medicare Other

## 2012-09-22 LAB — CBC
HCT: 27 % — ABNORMAL LOW (ref 39.0–52.0)
Hemoglobin: 9.3 g/dL — ABNORMAL LOW (ref 13.0–17.0)
MCH: 30 pg (ref 26.0–34.0)
MCHC: 34.4 g/dL (ref 30.0–36.0)
MCV: 87.1 fL (ref 78.0–100.0)
Platelets: 169 10*3/uL (ref 150–400)
RBC: 3.1 MIL/uL — ABNORMAL LOW (ref 4.22–5.81)
RDW: 15.5 % (ref 11.5–15.5)
WBC: 10 10*3/uL (ref 4.0–10.5)

## 2012-09-22 LAB — BASIC METABOLIC PANEL
BUN: 31 mg/dL — ABNORMAL HIGH (ref 6–23)
CO2: 23 mEq/L (ref 19–32)
Calcium: 9.6 mg/dL (ref 8.4–10.5)
Chloride: 101 mEq/L (ref 96–112)
Creatinine, Ser: 1.37 mg/dL — ABNORMAL HIGH (ref 0.50–1.35)
GFR calc Af Amer: 52 mL/min — ABNORMAL LOW (ref >90)
GFR calc non Af Amer: 45 mL/min — ABNORMAL LOW (ref >90)
Glucose, Bld: 162 mg/dL — ABNORMAL HIGH (ref 70–99)
Potassium: 4 mEq/L (ref 3.5–5.1)
Sodium: 136 mEq/L (ref 135–145)

## 2012-09-22 LAB — GLUCOSE, CAPILLARY
Glucose-Capillary: 130 mg/dL — ABNORMAL HIGH (ref 70–99)
Glucose-Capillary: 162 mg/dL — ABNORMAL HIGH (ref 70–99)

## 2012-09-22 MED ORDER — POTASSIUM CHLORIDE CRYS ER 20 MEQ PO TBCR
20.0000 meq | EXTENDED_RELEASE_TABLET | Freq: Once | ORAL | Status: AC
  Administered 2012-09-22: 20 meq via ORAL
  Filled 2012-09-22: qty 1

## 2012-09-22 MED ORDER — FUROSEMIDE 40 MG PO TABS
40.0000 mg | ORAL_TABLET | Freq: Once | ORAL | Status: AC
  Administered 2012-09-22: 40 mg via ORAL
  Filled 2012-09-22: qty 1

## 2012-09-22 MED ORDER — AMIODARONE HCL 200 MG PO TABS
200.0000 mg | ORAL_TABLET | Freq: Two times a day (BID) | ORAL | Status: DC
  Administered 2012-09-22 – 2012-09-24 (×4): 200 mg via ORAL
  Filled 2012-09-22 (×7): qty 1

## 2012-09-22 MED ORDER — METOPROLOL TARTRATE 25 MG PO TABS
25.0000 mg | ORAL_TABLET | Freq: Two times a day (BID) | ORAL | Status: DC
  Administered 2012-09-22 – 2012-09-24 (×5): 25 mg via ORAL
  Filled 2012-09-22 (×7): qty 1

## 2012-09-22 NOTE — Progress Notes (Signed)
09/22/2012 4:40 PM Nursing note Pt. Ambulated 350 ft with RW, RN and on RA. Pt. Tolerated well. Encouraged one more walk this evening.  Agueda Houpt, Blanchard Kelch

## 2012-09-22 NOTE — Progress Notes (Signed)
CARDIAC REHAB PHASE I   PRE:  Rate/Rhythm: 94SR  BP:  Supine:   Sitting: 128/60  Standing:    SaO2: 93%RA  MODE:  Ambulation: 550 ft   POST:  Rate/Rhythem: 108ST  BP:  Supine:  Sitting: 160/70  Standing:    SaO2: 95RA 1610-9604 Pt motivated and ready to walk. Walked 550 ft on RA with gait belt and asst x 1. Had assistance to get pt to stand. Had pt rock and knees a little stronger. Once up, pt does well. Stopped twice to take a standing rest break. To recliner after walk with call bell. Asking to walk again later. Notified pt that staff would make sure he gets more walks today.  Duanne Limerick

## 2012-09-22 NOTE — Progress Notes (Signed)
                   [address]            [place] [address]          [phone]      5 Days Post-Op Procedure(s) (LRB): CORONARY ARTERY BYPASS GRAFTING (CABG) (N/A) INTRAOPERATIVE TRANSESOPHAGEAL ECHOCARDIOGRAM (N/A) AORTIC VALVE REPLACEMENT (AVR) (N/A)  Subjective: Patient had bowel movement this am. Denies shortness of breath or chest pain.  Objective: Vital signs in last 24 hours: Temp:  [97.6 F (36.4 C)-98.4 F (36.9 C)] 98.2 F (36.8 C) (12/18 0452) Pulse Rate:  [78-96] 93  (12/18 0452) Cardiac Rhythm:  [-] Normal sinus rhythm (12/18 0740) Resp:  [15-21] 19  (12/18 0452) BP: (115-160)/(43-69) 160/65 mmHg (12/18 0452) SpO2:  [92 %-96 %] 93 % (12/18 0452) Weight:  [87.1 kg (192 lb 0.3 oz)] 87.1 kg (192 lb 0.3 oz) (12/18 0452)  Pre op weight  80.7 kg Current Weight  09/22/12 87.1 kg (192 lb 0.3 oz)      Intake/Output from previous day: 12/17 0701 - 12/18 0700 In: 260 [P.O.:240; I.V.:20] Out: 1125 [Urine:1125]   Physical Exam:  Cardiovascular: RRR, soft systolic murmur Pulmonary: Diminished at bases; no rales, wheezes, or rhonchi. Abdomen: Soft, non tender, bowel sounds present. Extremities: Mild bilateral lower extremity edema. Wounds: Clean and dry.  No erythema or signs of infection.  Lab Results: CBC: Basename 09/22/12 0448 09/21/12 0400  WBC 10.0 8.9  HGB 9.3* 8.4*  HCT 27.0* 23.8*  PLT 169 107*   BMET:  Basename 09/22/12 0448 09/21/12 0400  NA 136 135  K 4.0 2.8*  CL 101 104  CO2 23 21  GLUCOSE 162* 165*  BUN 31* 28*  CREATININE 1.37* 1.21  CALCIUM 9.6 8.0*    PT/INR:  Lab Results  Component Value Date   INR 2.00* 09/17/2012   INR 1.25 09/16/2012   ABG:  INR: Will add last result for INR, ABG once components are confirmed Will add last 4 CBG results once components are confirmed  Assessment/Plan:  1. CV - Previous afib but has been maintaining SR.On Amiodarone 200 bid and Lopressor 12.5 bid. SBP in has been  above 140's. Will increase Lopressor to 25 bid 2.  Pulmonary - CXR this am shows stable cardiomegaly, no pneuomothorax, low lung volumes, bilateral pleural effusions L>R, and some pulmonary vascular congestion.Encourage incentive spirometer and flutter valve 3. Volume Overload - Diurese and monitor Cr 4.  Acute blood loss anemia - H and H stable at 9.3 and 27 5.CBGs 147/138/123. Pre op HGA1C 6.3. Likely pre diabetic and will need further surveillance as an outpatient. 6.Creatinine slightly increased from 1.21 to 1.37. Not on ACE, monitor. 7.Thrombocytopenia resolved 8.Remove EPW in am  Hermenegildo Clausen MPA-C 09/22/2012,8:41 AM

## 2012-09-23 LAB — BASIC METABOLIC PANEL
BUN: 27 mg/dL — ABNORMAL HIGH (ref 6–23)
GFR calc Af Amer: 59 mL/min — ABNORMAL LOW (ref >90)
GFR calc non Af Amer: 51 mL/min — ABNORMAL LOW (ref >90)
Potassium: 3.8 mEq/L (ref 3.5–5.1)

## 2012-09-23 MED ORDER — FUROSEMIDE 40 MG PO TABS: 40.0000 mg | ORAL_TABLET | Freq: Every day | ORAL | Status: DC

## 2012-09-23 MED ORDER — POTASSIUM CHLORIDE CRYS ER 20 MEQ PO TBCR
20.0000 meq | EXTENDED_RELEASE_TABLET | Freq: Once | ORAL | Status: AC
  Administered 2012-09-23: 20 meq via ORAL
  Filled 2012-09-23: qty 1

## 2012-09-23 MED ORDER — TRAMADOL HCL 50 MG PO TABS: 50.0000 mg | ORAL_TABLET | Freq: Four times a day (QID) | ORAL | Status: DC | PRN

## 2012-09-23 MED ORDER — METOPROLOL TARTRATE 25 MG PO TABS: 25.0000 mg | ORAL_TABLET | Freq: Two times a day (BID) | ORAL | Status: DC

## 2012-09-23 MED ORDER — POTASSIUM CHLORIDE CRYS ER 20 MEQ PO TBCR: 20.0000 meq | EXTENDED_RELEASE_TABLET | Freq: Every day | ORAL | Status: DC

## 2012-09-23 MED ORDER — POTASSIUM CHLORIDE CRYS ER 20 MEQ PO TBCR
20.0000 meq | EXTENDED_RELEASE_TABLET | Freq: Every day | ORAL | Status: DC
  Administered 2012-09-23 – 2012-09-24 (×2): 20 meq via ORAL
  Filled 2012-09-23 (×3): qty 1

## 2012-09-23 MED ORDER — AMIODARONE HCL 200 MG PO TABS: 200.0000 mg | ORAL_TABLET | Freq: Two times a day (BID) | ORAL | Status: DC

## 2012-09-23 MED ORDER — FUROSEMIDE 40 MG PO TABS
40.0000 mg | ORAL_TABLET | Freq: Every day | ORAL | Status: DC
Start: 2012-09-23 — End: 2012-09-24
  Administered 2012-09-23 – 2012-09-24 (×2): 40 mg via ORAL
  Filled 2012-09-23 (×2): qty 1

## 2012-09-23 NOTE — Progress Notes (Signed)
CARDIAC REHAB PHASE I   PRE:  Rate/Rhythm: 84SR  BP:  Supine:   Sitting: 126/60  Standing:    SaO2: 95%RA  MODE:  Ambulation: 700 ft   POST:  Rate/Rhythem: 98  BP:  Supine:   Sitting: 136/64  Standing:    SaO2: 93%RA 1021-1054 Pt still needs a lot of help getting to standing when in low chair. Once up, able to walk 700 ft on RA with rolling walker and minimal asst. Took a couple of standing rest breaks due to SOB. Pt states can tell difference in breathing for the better after surgery. To recliner after walk. Tolerated well. Wife in room.  Duanne Limerick

## 2012-09-23 NOTE — Progress Notes (Addendum)
                   [address]            [place] [address]          [phone]      6 Days Post-Op Procedure(s) (LRB): CORONARY ARTERY BYPASS GRAFTING (CABG) (N/A) INTRAOPERATIVE TRANSESOPHAGEAL ECHOCARDIOGRAM (N/A) AORTIC VALVE REPLACEMENT (AVR) (N/A)  Subjective: Patient without complaints this am  Objective: Vital signs in last 24 hours: Temp:  [98.1 F (36.7 C)-98.4 F (36.9 C)] 98.4 F (36.9 C) (12/19 0424) Pulse Rate:  [87-95] 87  (12/19 0424) Cardiac Rhythm:  [-] Heart block (12/18 2000) Resp:  [18-20] 18  (12/19 0424) BP: (122-142)/(46-59) 142/59 mmHg (12/19 0424) SpO2:  [96 %-98 %] 97 % (12/19 0424) Weight:  [87.272 kg (192 lb 6.4 oz)] 87.272 kg (192 lb 6.4 oz) (12/19 0424)  Pre op weight  80.7 kg Current Weight  09/23/12 87.272 kg (192 lb 6.4 oz)      Intake/Output from previous day: 12/18 0701 - 12/19 0700 In: -  Out: 1100 [Urine:1100]   Physical Exam:  Cardiovascular: RRR, soft systolic murmur Pulmonary: Slightly diminished at bases; no rales, wheezes, or rhonchi. Abdomen: Soft, non tender, bowel sounds present. Extremities: Mild bilateral lower extremity edema. Wounds: Clean and dry.  No erythema or signs of infection.  Lab Results: CBC:  Basename 09/22/12 0448 09/21/12 0400  WBC 10.0 8.9  HGB 9.3* 8.4*  HCT 27.0* 23.8*  PLT 169 107*   BMET:   Basename 09/23/12 0435 09/22/12 0448  NA 136 136  K 3.8 4.0  CL 103 101  CO2 24 23  GLUCOSE 109* 162*  BUN 27* 31*  CREATININE 1.24 1.37*  CALCIUM 9.7 9.6    PT/INR:  Lab Results  Component Value Date   INR 2.00* 09/17/2012   INR 1.25 09/16/2012   ABG:  INR: Will add last result for INR, ABG once components are confirmed Will add last 4 CBG results once components are confirmed  Assessment/Plan:  1. CV - Previous afib but has been maintaining SR.On Amiodarone 200 bid and Lopressor 25 bid.  2.  Pulmonary - Encourage incentive spirometer and flutter valve.  3.  Volume Overload - Continue with diuresis 4.  Acute blood loss anemia - H and H stable at 9.3 and 27 5.CBGs 162/128/107. Pre op HGA1C 6.3. Likely pre diabetic and will need further surveillance as an outpatient. Will stop CBGs and SS PRN. 6.Creatinine decreased from 1.37 to 1.24.  7.Supplement potassium 8.Remove EPW  9.Possible discharge in am or Sat  ZIMMERMAN,[name] 09/23/2012,7:24 AM   feels better today home in one or two days I have seen and examined [name] and agree with the above assessment  and plan.  Eric Ovens MD Beeper [phone] Office [phone] 09/23/2012 9:19 AM

## 2012-09-23 NOTE — Discharge Summary (Signed)
Physician Discharge Summary  Patient ID: [name] MRN: [id number] DOB/AGE: 1924/10/01 78 y.o.  Admit date: 09/16/2012 Discharge date: 09/24/2012  Admission Diagnoses: 1.Severe aortic stenosis 2.Multivessel CAD (99% left main included) 3.History of hypertension 4.History of borderline diabetes 5.History of hypercholesterolemia 6.History of GI bleed 7.History of BPH  Discharge Diagnoses:  1.Severe aortic stenosis 2.Multivessel CAD (99% left main included) 3.History of hypertension 4.History of borderline diabetes 5.History of hypercholesterolemia 6.History of GI bleed 7.History of BPH 8.Atrial fibrillation (converted to NSR prior to discharge) 9.ABL anemia  Procedure (s):  1.Cardiac catheterization done by Dr. [name] on 09/16/2012: ANGIOGRAPHIC DATA: The left main coronary artery is heavily calcified and 99% segmental ostium to bifurcation stenosis with TIMI grade 3 flow.  The left anterior descending artery is heavily calcified with hazy 50% stenosis in the very proximal vessel and 50% stenosis in the first diagonal. The LAD is otherwise large.  The left circumflex artery is is basically one obtuse marginal branch or a ramus branch that is severely diseased proximally. I was unable to demonstrate any AV groove circumflex vasculature to  The right coronary artery is 80% mid RCA 50% mid to distal RCA the vessel is heavily calcified. The right coronary is dominant, giving the PDA and 3 left ventricular and to mid lateral wall branches.  LEFT VENTRICULOGRAM: Not performed  2.Aortic valve replacement with a pericardial tissue  valve, [employer] 23 mm #[id number] and coronary artery bypass grafting x3 with the left internal mammary to the left anterior descending coronary artery, reverse saphenous vein graft to the circumflex coronary artery,  and reverse saphenous vein graft to the distal right coronary artery with right leg endo vein harvesting by Dr. Tyrone Sage on  09/17/2012.  History of Presenting Illness: This is an 82 her old Caucasian, fairly active gentleman with a history of diet-controlled diabetes, hypertension, hyperlipidemia, and a long history of peptic ulcer disease who has known aortic stenosis that has been followed by periodic echocardiogram. His most recent echocardiogram from 05/27/2012 shows critical aortic valve stenosis with a peak velocity of 4.1 cm/s with a peak gradient of 70 mm mercury and a mean gradient of 41 mm mercury. Aortic valve area is calculated at 0.74-0.81 cm. He has severe concentric left ventricular hypertrophy with ejection fraction of 60-65%. Right heart size and function are normal. There is mild mitral valve regurgitation with severe mitral annular calcification. The aortic valve leaflets are severely calcified. There is evidence of grade 2 diastolic dysfunction with elevated left atrial pressure.       For the past 2 months, the patient's had subtle chest discomfort, occasional faintness but no syncope. Despite these symptoms, he remains active.  He presented to [facility] on 09/16/2012 for cardiac catheterization and consideration for TVAR in the future. His cath films reveal critical left main obstruction. Urgent consultation is requested for coronary artery bypass grafting and aortic valve replacement. Potential risks, complications, and benefits of the surgery were discussed with the patient and he agreed to proceed. Pre operative carotid duplex US showed no significant internal carotid artery stenosis bilaterally. He underwent a CABG x 3 and and AVR on 09/17/2012.  Brief Hospital Course:  He was extubated without difficulty later the evening of surgery. He remained afebrile and hemodynamically stable. His Theone Murdoch, a line, chest tubes, and foley were all removed early in his post operative course. He went into afib with controlled ventricular rate on post operative day one. He was given a bolus of Amiodarone followed  by  an Amiodarone gttp. He converted to sinus rhythm and was placed on oral Amiodarone.He was volume overloaded and diuresed accordingly. He had ABL anemia. His H and H went down to 7.6 and 21.8. He received a unit of PRBCs. Follow up H and H was 8.4 and 23.8. He also had thrombocytopenia. His platelet count went down to 88,000. He had resolution of this as his last platelet count was up to  169,000. He was felt surgically stable for transfer from the ICU to [facility]U for further convalescence on 09/21/2012. His creatinine did increase to 1.37 briefly. This did improve to 1.24.He has been tolerating a diet and has had a bowel movement. His epicardial pacing wires will be removed today. He has been ambulating well on room air. Provided he remains afebrile, hemodynamically stable, and pending morning round evaluation, he will be surgically stable for discharge on 09/24/2012.  Latest Vital Signs: Blood pressure 142/59, pulse 87, temperature 98.4 F (36.9 C), temperature source Oral, resp. rate 18, height 5\' 7"  (1.702 m), weight 87.272 kg (192 lb 6.4 oz), SpO2 97.00%.  Physical Exam: Cardiovascular: RRR, soft systolic murmur  Pulmonary: Slightly diminished at bases; no rales, wheezes, or rhonchi.  Abdomen: Soft, non tender, bowel sounds present.  Extremities: Mild bilateral lower extremity edema.  Wounds: Clean and dry. No erythema or signs of infection.   Discharge Condition:Stable  Recent laboratory studies:  Lab Results  Component Value Date   WBC 10.0 09/22/2012   HGB 9.3* 09/22/2012   HCT 27.0* 09/22/2012   MCV 87.1 09/22/2012   PLT 169 09/22/2012   Lab Results  Component Value Date   NA 136 09/23/2012   K 3.8 09/23/2012   CL 103 09/23/2012   CO2 24 09/23/2012   CREATININE 1.24 09/23/2012   GLUCOSE 109* 09/23/2012      Diagnostic Studies: Dg Chest 2 View  09/22/2012  *RADIOLOGY REPORT*  Clinical Data: Mid chest pain.  Follow-up CABG.  CHEST - 2 VIEW  Comparison: 09/20/2012.  Findings:  Trachea is midline.  Heart is enlarged, stable.  Thoracic aorta is calcified.  Sternotomy wires are unchanged in position. Right IJ catheter sheath has been removed.  Mild interstitial prominence may be due to low lung volumes, when comparison with baseline preoperative examination of 09/13/2012 is reviewed.  There are bilateral pleural effusions with bibasilar air space disease, left greater than right.  No pneumothorax.  IMPRESSION:  1.  Favor vascular crowding related to low lung volumes, over pulmonary edema. 2.  Bilateral pleural effusions and bilateral pleural effusions,   Original Report Authenticated By: Leanna Battles, M.D.    Discharge Medications:   Medication List     As of 09/23/2012  8:24 AM    STOP taking these medications         diltiazem 240 MG 24 hr capsule   Commonly known as: TIAZAC      triamterene-hydrochlorothiazide 37.5-25 MG per tablet   Commonly known as: MAXZIDE-25      TAKE these medications         amiodarone 200 MG tablet   Commonly known as: PACERONE   Take 1 tablet (200 mg total) by mouth 2 (two) times daily after a meal. For one week then take Amiodarone 200 mg po daily thereafter      aspirin 325 MG EC tablet   Take 325 mg by mouth daily as needed. For pain      famotidine 20 MG tablet   Commonly known as: PEPCID  Take 20 mg by mouth daily.      furosemide 40 MG tablet   Commonly known as: LASIX   Take 1 tablet (40 mg total) by mouth daily. For one week then stop.      metoprolol tartrate 25 MG tablet   Commonly known as: LOPRESSOR   Take 1 tablet (25 mg total) by mouth 2 (two) times daily.      potassium chloride SA 20 MEQ tablet   Commonly known as: K-DUR,KLOR-CON   Take 1 tablet (20 mEq total) by mouth daily. For one week then stop.      simvastatin 20 MG tablet   Commonly known as: ZOCOR   Take 10 mg by mouth every evening.      traMADol 50 MG tablet   Commonly known as: ULTRAM   Take 1 tablet (50 mg total) by mouth every 6 (six)  hours as needed for pain.      The patient has been discharged on:   1.Beta Blocker:  Yes [  x ]                              No   [   ]                              If No, reason:  2.Ace Inhibitor/ARB: Yes [   ]                                     No  [  x  ]                                     If No, reason: Preserved LVEF  3.Statin:   Yes [  x ]                  No  [   ]                  If No, reason:  4.Ecasa:  Yes  [  x ]                  No   [   ]                  If No, reason:   Follow Up Appointments:     Follow-up Information    Follow up with [name], MD. (Call for a follow up appointment for 2 weeks)    Contact information:   [address] [place] [address] [phone]       Follow up with GERHARDT,EDWARD B, MD. (PA/LAT CXR to be taken (at [facility] which is in the same building as Dr. Dennie Maizes office) on  at; Appointment with Dr. Tyrone Sage is on  at)    Contact information:   [address] [place] [address] [phone]       Follow up with [name], MD. (Call for follow up appointment regarding further surveillance of HGA1C 6.3)    Contact information:   [address] [place] [address] [phone]          Signed: Doree Fudge MPA-C  09/23/2012, 8:24 AM

## 2012-09-23 NOTE — Progress Notes (Signed)
EPW pulled at 1505 per MD order. All wires intact upon removal. Vital signs stable. Pt tolerated well. No arrhythmias noted. Bed rest until 1605. Call bell in reach. [name]

## 2012-09-23 NOTE — Progress Notes (Addendum)
No complaints this morning. NSR on monitor. Last AF was > 48 hours ago. Recommend continuing amiodarone and i will d/c as an OP. Okay to transition to 200 mg daily after 10-14 days(total) of 400 mg daily.

## 2012-09-24 NOTE — Progress Notes (Signed)
[phone] Completed discharge education with pt and wife. They voice understanding. Pt agrees to Outpt. CRP in [place], will  send referral.

## 2012-09-24 NOTE — Progress Notes (Signed)
He had a good night. Several episode of short lived AF with controlled VR. Have discussed f/u and reasons that he should call. Valves sounds good. Plan f/u with me 10/09/11 @ 11:15 am

## 2012-09-24 NOTE — Progress Notes (Signed)
Removed CT sutures and applied bezoin and 1/2 " steri-strips. Pt given signs and symptoms of infection. Pt verbalized understanding. Thomas Hoff

## 2012-09-24 NOTE — Progress Notes (Signed)
                    [address]            [place] [address]          [phone]     7 Days Post-Op Procedure(s) (LRB): CORONARY ARTERY BYPASS GRAFTING (CABG) (N/A) INTRAOPERATIVE TRANSESOPHAGEAL ECHOCARDIOGRAM (N/A) AORTIC VALVE REPLACEMENT (AVR) (N/A)  Subjective: Feels well, no complaints.  Had 2 brief runs of rate controlled AF early this am, asymptomatic.      Objective: Vital signs in last 24 hours: Patient Vitals for the past 24 hrs:  BP Temp Temp src Pulse Resp SpO2 Weight  09/24/12 0535 117/53 mmHg 98.5 F (36.9 C) Oral 79  19  94 % -  09/24/12 0340 - - - - - - 190 lb 0.6 oz (86.2 kg)  09/23/12 2001 112/65 mmHg 98.6 F (37 C) Oral 101  18  96 % -  09/23/12 1514 120/63 mmHg - - 86  - - -  09/23/12 1511 122/62 mmHg - - 91  - - -  09/23/12 1508 131/58 mmHg - - 96  - - -  09/23/12 1503 123/69 mmHg - - 109  - - -  09/23/12 1350 105/63 mmHg 98.8 F (37.1 C) Oral 95  19  95 % -   Current Weight  09/24/12 190 lb 0.6 oz (86.2 kg)     Intake/Output from previous day: 12/19 0701 - 12/20 0700 In: 720 [P.O.:720] Out: 2000 [Urine:2000]    PHYSICAL EXAM:  Heart: RRR Lungs: Clear Wound: Clean and dry Extremities: Mild RLE edema    Lab Results: CBC: Basename 09/22/12 0448  WBC 10.0  HGB 9.3*  HCT 27.0*  PLT 169   BMET:  Basename 09/23/12 0435 09/22/12 0448  NA 136 136  K 3.8 4.0  CL 103 101  CO2 24 23  GLUCOSE 109* 162*  BUN 27* 31*  CREATININE 1.24 1.37*  CALCIUM 9.7 9.6    PT/INR: No results found for this basename: LABPROT,INR in the last 72 hours    Assessment/Plan: S/P Procedure(s) (LRB): CORONARY ARTERY BYPASS GRAFTING (CABG) (N/A) INTRAOPERATIVE TRANSESOPHAGEAL ECHOCARDIOGRAM (N/A) AORTIC VALVE REPLACEMENT (AVR) (N/A)  CV- mostly SR except for 2 brief runs AF earlier this am.  RN reports that pt did not get pm dose of Amio as scheduled as there was an issue with the med cart and they could not access the meds, and  pharmacy could not send up another dose.  Will watch and continue current meds.  Vol overload- diurese.  CRPI, pulm toilet.  Home probably in am if remains stable.    LOS: 8 days    Laree Garron H 09/24/2012

## 2012-10-07 ENCOUNTER — Other Ambulatory Visit: Payer: Self-pay | Admitting: *Deleted

## 2012-10-07 DIAGNOSIS — Z952 Presence of prosthetic heart valve: Secondary | ICD-10-CM

## 2012-10-07 DIAGNOSIS — Z951 Presence of aortocoronary bypass graft: Secondary | ICD-10-CM

## 2012-10-08 DIAGNOSIS — Z79899 Other long term (current) drug therapy: Secondary | ICD-10-CM | POA: Diagnosis not present

## 2012-10-08 DIAGNOSIS — Z7901 Long term (current) use of anticoagulants: Secondary | ICD-10-CM | POA: Diagnosis not present

## 2012-10-08 DIAGNOSIS — I10 Essential (primary) hypertension: Secondary | ICD-10-CM | POA: Diagnosis not present

## 2012-10-08 DIAGNOSIS — E119 Type 2 diabetes mellitus without complications: Secondary | ICD-10-CM | POA: Diagnosis not present

## 2012-10-08 DIAGNOSIS — I5031 Acute diastolic (congestive) heart failure: Secondary | ICD-10-CM | POA: Diagnosis not present

## 2012-10-08 DIAGNOSIS — Z952 Presence of prosthetic heart valve: Secondary | ICD-10-CM | POA: Diagnosis not present

## 2012-10-08 DIAGNOSIS — I4891 Unspecified atrial fibrillation: Secondary | ICD-10-CM | POA: Diagnosis not present

## 2012-10-11 ENCOUNTER — Ambulatory Visit
Admission: RE | Admit: 2012-10-11 | Discharge: 2012-10-11 | Disposition: A | Discharge status: Home / Self Care | Payer: Medicare Other | Source: Ambulatory Visit | Attending: Cardiothoracic Surgery | Admitting: Cardiothoracic Surgery

## 2012-10-11 ENCOUNTER — Encounter: Payer: Self-pay | Admitting: Cardiothoracic Surgery

## 2012-10-11 ENCOUNTER — Other Ambulatory Visit: Payer: Self-pay | Admitting: *Deleted

## 2012-10-11 ENCOUNTER — Ambulatory Visit (INDEPENDENT_AMBULATORY_CARE_PROVIDER_SITE_OTHER): Payer: Self-pay | Admitting: Cardiothoracic Surgery

## 2012-10-11 ENCOUNTER — Ambulatory Visit (HOSPITAL_COMMUNITY)
Admission: RE | Admit: 2012-10-11 | Discharge: 2012-10-11 | Disposition: A | Discharge status: Home / Self Care | Payer: Medicare Other | Source: Ambulatory Visit | Attending: Cardiothoracic Surgery | Admitting: Cardiothoracic Surgery

## 2012-10-11 VITALS — BP 151/68 | HR 73 | Resp 16 | Ht 67.0 in | Wt 189.8 lb

## 2012-10-11 DIAGNOSIS — I369 Nonrheumatic tricuspid valve disorder, unspecified: Secondary | ICD-10-CM | POA: Insufficient documentation

## 2012-10-11 DIAGNOSIS — I5031 Acute diastolic (congestive) heart failure: Secondary | ICD-10-CM | POA: Diagnosis not present

## 2012-10-11 DIAGNOSIS — J90 Pleural effusion, not elsewhere classified: Secondary | ICD-10-CM | POA: Diagnosis not present

## 2012-10-11 DIAGNOSIS — R0602 Shortness of breath: Secondary | ICD-10-CM

## 2012-10-11 DIAGNOSIS — J9819 Other pulmonary collapse: Secondary | ICD-10-CM | POA: Diagnosis not present

## 2012-10-11 DIAGNOSIS — I359 Nonrheumatic aortic valve disorder, unspecified: Secondary | ICD-10-CM

## 2012-10-11 DIAGNOSIS — Z954 Presence of other heart-valve replacement: Secondary | ICD-10-CM

## 2012-10-11 DIAGNOSIS — R609 Edema, unspecified: Secondary | ICD-10-CM

## 2012-10-11 DIAGNOSIS — Z951 Presence of aortocoronary bypass graft: Secondary | ICD-10-CM

## 2012-10-11 DIAGNOSIS — I319 Disease of pericardium, unspecified: Secondary | ICD-10-CM | POA: Diagnosis not present

## 2012-10-11 DIAGNOSIS — I059 Rheumatic mitral valve disease, unspecified: Secondary | ICD-10-CM | POA: Insufficient documentation

## 2012-10-11 DIAGNOSIS — Z952 Presence of prosthetic heart valve: Secondary | ICD-10-CM

## 2012-10-11 DIAGNOSIS — I2510 Atherosclerotic heart disease of native coronary artery without angina pectoris: Secondary | ICD-10-CM

## 2012-10-11 DIAGNOSIS — I350 Nonrheumatic aortic (valve) stenosis: Secondary | ICD-10-CM

## 2012-10-11 MED ORDER — FUROSEMIDE 40 MG PO TABS: 80.0000 mg | ORAL_TABLET | Freq: Two times a day (BID) | ORAL | Status: DC

## 2012-10-11 NOTE — Progress Notes (Signed)
  Echocardiogram 2D Echocardiogram has been performed.  Ellender Hose A 10/11/2012, 4:33 PM

## 2012-10-11 NOTE — Patient Instructions (Addendum)
1% hydrocortisone cream to ankles twice a day- over the counter Keep feet elevated Stay off asa while on Xarelto Echo cardiogram at Dr [name] office Take Lasix 80 mg twice a day until Wednesday when you see Dr [name]

## 2012-10-11 NOTE — Progress Notes (Signed)
[address]            [place] [address]          [phone]       [name] [facility] Medical Record #[id number] Date of Birth: [birth year]  Eric Noe, MD Eric Raider, MD  Chief Complaint:   PostOp Follow Up Visit 09/17/2012   OPERATIVE REPORT  PREOPERATIVE DIAGNOSIS: Critical left main and critical aortic  stenosis.  POSTOPERATIVE DIAGNOSIS: Critical left main and critical aortic  stenosis.  SURGICAL PROCEDURE: Aortic valve replacement with a pericardial tissue  valve, [employer] 23 mm #[id number] and coronary artery bypass grafting x3  with the left internal mammary to the left anterior descending coronary  artery, reverse saphenous vein graft to the circumflex coronary artery,  and reverse saphenous vein graft to the distal right coronary artery  with right leg endo vein harvesting.   History of Present Illness:     Patient returns to the office in followup after recent aortic valve replacement and coronary artery bypass grafting for critical aortic stenosis and severe left main disease. He notes that over the past 10 days he's had progressive increase in his lower extremity edema now significant up to his knees. 2 days ago he was seen by Dr. Katrinka Duncan and his Lasix was increased to 80 mg a day. He was also noted to be in atrial fibrillation and was started on xarlato. He's had no chest pain, is able to ambulate around his house relatively well. Denies PND.     History  Smoking status  . Never Smoker   Smokeless tobacco  . Never Used       No Known Allergies  Current Outpatient Prescriptions  Medication Sig Dispense Refill  . amiodarone (PACERONE) 200 MG tablet Take 1 tablet (200 mg total) by mouth 2 (two) times daily after a meal. For one week then take Amiodarone 200 mg po daily thereafter  30 tablet  1  . aspirin 325 MG EC tablet Take 325 mg by mouth daily as needed. For pain      . famotidine  (PEPCID) 20 MG tablet Take 20 mg by mouth daily.      . furosemide (LASIX) 40 MG tablet Take 80 mg by mouth daily.      . metoprolol tartrate (LOPRESSOR) 25 MG tablet Take 1 tablet (25 mg total) by mouth 2 (two) times daily.  60 tablet  1  . potassium chloride SA (K-DUR,KLOR-CON) 20 MEQ tablet Take 20 mEq by mouth daily.      . Rivaroxaban (XARELTO) 15 MG TABS tablet Take 15 mg by mouth daily.      . simvastatin (ZOCOR) 20 MG tablet Take 10 mg by mouth every evening.       . traMADol (ULTRAM) 50 MG tablet Take 1 tablet (50 mg total) by mouth every 6 (six) hours as needed for pain.  30 tablet  0       Physical Exam: BP 151/68  Pulse 73  Resp 16  Ht 5\' 7"  (1.702 m)  Wt 189 lb 12.8 oz (86.093 kg)  BMI 29.73 kg/m2  SpO2 95%  General appearance: alert, cooperative, appears stated age and no distress Neurologic: intact Heart: regular rate and rhythm and friction rub heard left sternal upper left upper sternal border Lungs: diminished breath sounds LLL Abdomen: soft, non-tender; bowel sounds normal;  no masses,  no organomegaly Extremities: Patient had significant edema below the knees bilaterally with raised erythematous rash around the ankles bilaterally as if a contact dermatitis in the distribution of his socks only. He had no rash on the rest of his body Wound: His sternum is stable and well healed  Wounds: The vein harvest sites in the right proximal thigh are well-healed without evidence of infection  Diagnostic Studies & Laboratory data:         Recent Radiology Findings: Dg Chest 2 View  10/11/2012  *RADIOLOGY REPORT*  Clinical Data: CABG.  Shortness of breath.  CHEST - 2 VIEW  Comparison: 09/22/2012  Findings: Bilateral pleural effusions and bibasilar atelectasis again noted, not significantly changed.  Prior CABG.  Heart is borderline in size.  IMPRESSION: Stable small bilateral effusions and bibasilar atelectasis.   Original Report Authenticated By: Charlett Nose, M.D.        Recent Labs: Lab Results  Component Value Date   WBC 10.0 09/22/2012   HGB 9.3* 09/22/2012   HCT 27.0* 09/22/2012   PLT 169 09/22/2012   GLUCOSE 109* 09/23/2012   ALT 12 09/16/2012   AST 22 09/16/2012   NA 136 09/23/2012   K 3.8 09/23/2012   CL 103 09/23/2012   CREATININE 1.24 09/23/2012   BUN 27* 09/23/2012   CO2 24 09/23/2012   INR 2.00* 09/17/2012   HGBA1C 6.3* 09/16/2012      Assessment / Plan:     Patient status post aortic valve replacement and coronary artery bypass grafting was noted to be in atrial fibrillation last week, with increasing symptoms of significant lower extremity edema bilaterally. He has a pericardial friction rub on physical exam. He is now on Zaroxolyn Asked him to increase his Lasix to 80 mg twice a day for the next 2 days until he sees Dr. Cheryle Duncan Dr. Michaelle Duncan office to obtain an echocardiogram today or tomorrow He'll treat the rash with topical hydrocortisone cream empirically He may need Aldactone added to his current diuretic regimen if there is no response to increasing dose of Lasix I plan to see him back 1 week after Dr. Katrinka Duncan sees him this coming Wednesday.      [name] B 10/11/2012 2:42 PM

## 2012-10-13 DIAGNOSIS — Z7901 Long term (current) use of anticoagulants: Secondary | ICD-10-CM | POA: Diagnosis not present

## 2012-10-13 DIAGNOSIS — Z952 Presence of prosthetic heart valve: Secondary | ICD-10-CM | POA: Diagnosis not present

## 2012-10-13 DIAGNOSIS — I5031 Acute diastolic (congestive) heart failure: Secondary | ICD-10-CM | POA: Diagnosis not present

## 2012-10-13 DIAGNOSIS — I4891 Unspecified atrial fibrillation: Secondary | ICD-10-CM | POA: Diagnosis not present

## 2012-10-13 DIAGNOSIS — R012 Other cardiac sounds: Secondary | ICD-10-CM | POA: Diagnosis not present

## 2012-10-13 DIAGNOSIS — I10 Essential (primary) hypertension: Secondary | ICD-10-CM | POA: Diagnosis not present

## 2012-10-13 DIAGNOSIS — Z79899 Other long term (current) drug therapy: Secondary | ICD-10-CM | POA: Diagnosis not present

## 2012-10-18 ENCOUNTER — Other Ambulatory Visit: Payer: Self-pay | Admitting: *Deleted

## 2012-10-18 DIAGNOSIS — I5031 Acute diastolic (congestive) heart failure: Secondary | ICD-10-CM | POA: Diagnosis not present

## 2012-10-18 DIAGNOSIS — Z952 Presence of prosthetic heart valve: Secondary | ICD-10-CM | POA: Diagnosis not present

## 2012-10-18 DIAGNOSIS — Z7901 Long term (current) use of anticoagulants: Secondary | ICD-10-CM | POA: Diagnosis not present

## 2012-10-18 DIAGNOSIS — I2510 Atherosclerotic heart disease of native coronary artery without angina pectoris: Secondary | ICD-10-CM

## 2012-10-18 DIAGNOSIS — Z79899 Other long term (current) drug therapy: Secondary | ICD-10-CM | POA: Diagnosis not present

## 2012-10-18 DIAGNOSIS — I4891 Unspecified atrial fibrillation: Secondary | ICD-10-CM | POA: Diagnosis not present

## 2012-10-21 ENCOUNTER — Encounter: Payer: Self-pay | Admitting: Cardiothoracic Surgery

## 2012-10-21 ENCOUNTER — Ambulatory Visit
Admission: RE | Admit: 2012-10-21 | Discharge: 2012-10-21 | Disposition: A | Discharge status: Home / Self Care | Payer: Medicare Other | Source: Ambulatory Visit | Attending: Cardiothoracic Surgery | Admitting: Cardiothoracic Surgery

## 2012-10-21 ENCOUNTER — Ambulatory Visit (INDEPENDENT_AMBULATORY_CARE_PROVIDER_SITE_OTHER): Payer: Self-pay | Admitting: Cardiothoracic Surgery

## 2012-10-21 VITALS — BP 113/57 | HR 74 | Resp 18 | Ht 67.0 in | Wt 189.0 lb

## 2012-10-21 DIAGNOSIS — Z951 Presence of aortocoronary bypass graft: Secondary | ICD-10-CM | POA: Diagnosis not present

## 2012-10-21 DIAGNOSIS — I359 Nonrheumatic aortic valve disorder, unspecified: Secondary | ICD-10-CM

## 2012-10-21 DIAGNOSIS — Z954 Presence of other heart-valve replacement: Secondary | ICD-10-CM

## 2012-10-21 DIAGNOSIS — I2510 Atherosclerotic heart disease of native coronary artery without angina pectoris: Secondary | ICD-10-CM

## 2012-10-21 DIAGNOSIS — I350 Nonrheumatic aortic (valve) stenosis: Secondary | ICD-10-CM

## 2012-10-21 DIAGNOSIS — Z952 Presence of prosthetic heart valve: Secondary | ICD-10-CM

## 2012-10-21 DIAGNOSIS — J90 Pleural effusion, not elsewhere classified: Secondary | ICD-10-CM | POA: Diagnosis not present

## 2012-10-21 DIAGNOSIS — J9819 Other pulmonary collapse: Secondary | ICD-10-CM | POA: Diagnosis not present

## 2012-10-21 NOTE — Progress Notes (Signed)
[address]            [place] [address]          [phone]        MALLIE LINNEMANN [facility] Medical Record #[id number] Date of Birth: 1923/12/07  [name], MD [name], MD  Chief Complaint:   PostOp Follow Up Visit 09/17/2012   OPERATIVE REPORT  PREOPERATIVE DIAGNOSIS: Critical left main and critical aortic  stenosis.  POSTOPERATIVE DIAGNOSIS: Critical left main and critical aortic  stenosis.  SURGICAL PROCEDURE: Aortic valve replacement with a pericardial tissue  valve, [employer] 23 mm #[id number] and coronary artery bypass grafting x3  with the left internal mammary to the left anterior descending coronary  artery, reverse saphenous vein graft to the circumflex coronary artery,  and reverse saphenous vein graft to the distal right coronary artery  with right leg endo vein harvesting.   History of Present Illness:     Patient returns to the office in followup after recent aortic valve replacement and coronary artery bypass grafting for critical aortic stenosis and severe left main disease. Since seen last week and on the increased dose of Lasix and what appears to be staying in sinus rhythm the patient has improved. He is less dyspneic he still has significant lower tremor the edema but it is better than it was previously. He is able to tolerate a low level of exercise now.    History  Smoking status  . Never Smoker   Smokeless tobacco  . Never Used       No Known Allergies  Current Outpatient Prescriptions  Medication Sig Dispense Refill  . amiodarone (PACERONE) 200 MG tablet Take 200 mg by mouth daily.      Marland Kitchen aspirin 325 MG EC tablet Take 325 mg by mouth daily as needed. For pain      . famotidine (PEPCID) 20 MG tablet Take 20 mg by mouth daily.      . furosemide (LASIX) 40 MG tablet Take 2 tablets (80 mg total) by mouth 2 (two) times daily.  30 tablet  1  . metoprolol tartrate (LOPRESSOR)  25 MG tablet Take 1 tablet (25 mg total) by mouth 2 (two) times daily.  60 tablet  1  . potassium chloride SA (K-DUR,KLOR-CON) 20 MEQ tablet Take 20 mEq by mouth daily.      . Rivaroxaban (XARELTO) 15 MG TABS tablet Take 15 mg by mouth daily.      . simvastatin (ZOCOR) 20 MG tablet Take 10 mg by mouth every evening.       . traMADol (ULTRAM) 50 MG tablet Take 1 tablet (50 mg total) by mouth every 6 (six) hours as needed for pain.  30 tablet  0       Physical Exam: BP 113/57  Pulse 74  Resp 18  Ht 5\' 7"  (1.702 m)  Wt 189 lb (85.73 kg)  BMI 29.60 kg/m2  SpO2 96%  General appearance: alert, cooperative, appears stated age and no distress Neurologic: intact Heart: regular rate and rhythm and friction rub heard left sternal upper left upper sternal border Lungs: diminished breath sounds LLL Abdomen: soft, non-tender; bowel sounds normal; no masses,  no organomegaly Extremities: Patient had significant edema below the knees bilaterally with raised erythematous rash around the ankles bilaterally . The dermatitis involving  his lower legs is better than last week .He had no rash on the rest of his body Wound: His sternum is stable and well healed  Wounds: The vein harvest sites in the right proximal thigh are well-healed without evidence of infection  Diagnostic Studies & Laboratory data:         Recent Radiology Findings: Dg Chest 2 View  10/21/2012  *RADIOLOGY REPORT*  Clinical Data: CABG, follow-up, cough  CHEST - 2 VIEW  Comparison: Chest x-ray of 10/11/2012  Findings: There is little change in bilateral pleural effusions left greater than right with basilar atelectasis left greater than right.  Cardiomegaly is stable.  Median sternotomy sutures are noted from CABG and aortic valve replacement.  There are degenerative changes throughout the thoracic spine.  IMPRESSION: Little change in bilateral pleural effusions left greater than right with bibasilar atelectasis.   Original Report  Authenticated By: Dwyane Dee, M.D.       Recent Labs: Lab Results  Component Value Date   WBC 10.0 09/22/2012   HGB 9.3* 09/22/2012   HCT 27.0* 09/22/2012   PLT 169 09/22/2012   GLUCOSE 109* 09/23/2012   ALT 12 09/16/2012   AST 22 09/16/2012   NA 136 09/23/2012   K 3.8 09/23/2012   CL 103 09/23/2012   CREATININE 1.24 09/23/2012   BUN 27* 09/23/2012   CO2 24 09/23/2012   INR 2.00* 09/17/2012   HGBA1C 6.3* 09/16/2012      Assessment / Plan:     Overall the patient has shown some improvement, he continues on high dose diuretic The echocardiogram did not show any malfunction of his prosthetic valve. He still has a moderate left pleural effusion, we will obtain a followup chest x-ray in another week, he may require left thoracentesis He appears to be in sinus rhythm today, hopefully with continued diuresis he will continue to show improvement. I plan to see him back in one week.    Lorilynn Lehr B 10/21/2012 2:14 PM

## 2012-10-25 ENCOUNTER — Other Ambulatory Visit: Payer: Self-pay | Admitting: *Deleted

## 2012-10-25 DIAGNOSIS — I2510 Atherosclerotic heart disease of native coronary artery without angina pectoris: Secondary | ICD-10-CM

## 2012-10-26 DIAGNOSIS — Z7901 Long term (current) use of anticoagulants: Secondary | ICD-10-CM | POA: Diagnosis not present

## 2012-10-26 DIAGNOSIS — E782 Mixed hyperlipidemia: Secondary | ICD-10-CM | POA: Diagnosis not present

## 2012-10-26 DIAGNOSIS — I5031 Acute diastolic (congestive) heart failure: Secondary | ICD-10-CM | POA: Diagnosis not present

## 2012-10-26 DIAGNOSIS — I4891 Unspecified atrial fibrillation: Secondary | ICD-10-CM | POA: Diagnosis not present

## 2012-10-26 DIAGNOSIS — Z952 Presence of prosthetic heart valve: Secondary | ICD-10-CM | POA: Diagnosis not present

## 2012-10-26 DIAGNOSIS — I10 Essential (primary) hypertension: Secondary | ICD-10-CM | POA: Diagnosis not present

## 2012-10-26 DIAGNOSIS — Z79899 Other long term (current) drug therapy: Secondary | ICD-10-CM | POA: Diagnosis not present

## 2012-10-28 ENCOUNTER — Encounter: Payer: Self-pay | Admitting: Cardiothoracic Surgery

## 2012-10-28 ENCOUNTER — Other Ambulatory Visit: Payer: Self-pay

## 2012-10-28 ENCOUNTER — Ambulatory Visit
Admission: RE | Admit: 2012-10-28 | Discharge: 2012-10-28 | Disposition: A | Discharge status: Home / Self Care | Payer: Medicare Other | Source: Ambulatory Visit | Attending: Cardiothoracic Surgery | Admitting: Cardiothoracic Surgery

## 2012-10-28 ENCOUNTER — Ambulatory Visit (INDEPENDENT_AMBULATORY_CARE_PROVIDER_SITE_OTHER): Payer: Self-pay | Admitting: Cardiothoracic Surgery

## 2012-10-28 VITALS — BP 119/62 | HR 73 | Resp 16 | Ht 67.0 in | Wt 180.0 lb

## 2012-10-28 DIAGNOSIS — Z951 Presence of aortocoronary bypass graft: Secondary | ICD-10-CM

## 2012-10-28 DIAGNOSIS — I359 Nonrheumatic aortic valve disorder, unspecified: Secondary | ICD-10-CM

## 2012-10-28 DIAGNOSIS — J9819 Other pulmonary collapse: Secondary | ICD-10-CM | POA: Diagnosis not present

## 2012-10-28 DIAGNOSIS — Z952 Presence of prosthetic heart valve: Secondary | ICD-10-CM

## 2012-10-28 DIAGNOSIS — Z954 Presence of other heart-valve replacement: Secondary | ICD-10-CM

## 2012-10-28 DIAGNOSIS — I350 Nonrheumatic aortic (valve) stenosis: Secondary | ICD-10-CM

## 2012-10-28 DIAGNOSIS — J90 Pleural effusion, not elsewhere classified: Secondary | ICD-10-CM

## 2012-10-28 DIAGNOSIS — I2510 Atherosclerotic heart disease of native coronary artery without angina pectoris: Secondary | ICD-10-CM

## 2012-10-31 NOTE — Progress Notes (Signed)
[address]            [place] [address]          [phone]     DAMARKO STITELY [facility] Medical Record #[id number] Date of Birth: [birth year]  [name], MD [name], MD  Chief Complaint:   PostOp Follow Up Visit 09/17/2012   OPERATIVE REPORT  PREOPERATIVE DIAGNOSIS: Critical left main and critical aortic  stenosis.  POSTOPERATIVE DIAGNOSIS: Critical left main and critical aortic  stenosis.  SURGICAL PROCEDURE: Aortic valve replacement with a pericardial tissue  valve, [employer] 23 mm #[id number] and coronary artery bypass grafting x3  with the left internal mammary to the left anterior descending coronary  artery, reverse saphenous vein graft to the circumflex coronary artery,  and reverse saphenous vein graft to the distal right coronary artery  with right leg endo vein harvesting.   History of Present Illness:     Patient returns to the office in followup after recent aortic valve replacement and coronary artery bypass grafting for critical aortic stenosis and severe left main disease. Since seen last week t appears to be staying in sinus rhythm and overall patient has improved. He is less dyspneic. He   still has lower extremity  edema but it is better than it was previously. He is able to tolerate a low level of exercise now.    History  Smoking status  . Never Smoker   Smokeless tobacco  . Never Used       No Known Allergies  Current Outpatient Prescriptions  Medication Sig Dispense Refill  . amiodarone (PACERONE) 200 MG tablet Take 200 mg by mouth daily.      Marland Kitchen aspirin 325 MG EC tablet Take 325 mg by mouth daily as needed. For pain      . famotidine (PEPCID) 20 MG tablet Take 20 mg by mouth daily.      . furosemide (LASIX) 40 MG tablet Take 2 tablets (80 mg total) by mouth 2 (two) times daily.  30 tablet  1  . metoprolol tartrate (LOPRESSOR) 25 MG tablet Take 1 tablet  (25 mg total) by mouth 2 (two) times daily.  60 tablet  1  . potassium chloride SA (K-DUR,KLOR-CON) 20 MEQ tablet Take 20 mEq by mouth daily.      . Rivaroxaban (XARELTO) 15 MG TABS tablet Take 15 mg by mouth daily.      . simvastatin (ZOCOR) 20 MG tablet Take 10 mg by mouth every evening.       . traMADol (ULTRAM) 50 MG tablet Take 1 tablet (50 mg total) by mouth every 6 (six) hours as needed for pain.  30 tablet  0       Physical Exam: BP 119/62  Pulse 73  Resp 16  Ht 5\' 7"  (1.702 m)  Wt 180 lb (81.647 kg)  BMI 28.19 kg/m2  SpO2 97%  General appearance: alert, cooperative, appears stated age and no distress Neurologic: intact Heart: regular rate and rhythm and friction rub heard left sternal upper left upper sternal border Lungs: diminished breath sounds LLL Abdomen: soft, non-tender; bowel sounds normal; no masses,  no organomegaly Extremities: Patient had significant edema below the knees bilaterally with raised erythematous rash  around the ankles bilaterally . The dermatitis involving his lower legs has almost completely resolved  Wound: His sternum is stable and well healed  Wounds: The vein harvest sites in the right proximal thigh are well-healed without evidence of infection  Diagnostic Studies & Laboratory data:         Recent Radiology Findings: Dg Chest 2 View  10/28/2012  *RADIOLOGY REPORT*  Clinical Data: Left chest tightness coronary artery disease post CABG and AVR, hypertension  CHEST - 2 VIEW  Comparison: 10/21/2012  Findings: Minimal enlargement of cardiac silhouette post CABG and AVR. Mitral annular calcification. Atherosclerotic calcification aorta. Pulmonary vascularity normal. Bibasilar effusions and atelectasis left greater than right similar to previous exam. Underlying emphysematous changes. Upper lungs clear. No pneumothorax or acute osseous findings.  IMPRESSION: Minimal enlargement of cardiac silhouette post CABG and AVR. Persistent bibasilar effusions  and atelectasis left greater than right. Underlying COPD suspected.   Original Report Authenticated By: Ulyses Southward, M.D.    Recent Labs: Lab Results  Component Value Date   WBC 10.0 09/22/2012   HGB 9.3* 09/22/2012   HCT 27.0* 09/22/2012   PLT 169 09/22/2012   GLUCOSE 109* 09/23/2012   ALT 12 09/16/2012   AST 22 09/16/2012   NA 136 09/23/2012   K 3.8 09/23/2012   CL 103 09/23/2012   CREATININE 1.24 09/23/2012   BUN 27* 09/23/2012   CO2 24 09/23/2012   INR 2.00* 09/17/2012   HGBA1C 6.3* 09/16/2012      Assessment / Plan:    Overall the patient has shown some improvement, he continues on high dose diuretic The echocardiogram did not show any malfunction of his prosthetic valve. He still has a moderate left pleural effusion that has increased slightly , he will require left thoracentesis, will hold anticoagulate temporarily and proceed with US guided thorcentesis. He appears to be in sinus rhythm today, hopefully with continued diuresis he will continue to show improvement. I plan to see him back in two weeks with chest xray    Mare Ludtke B 10/31/2012 7:12 PM

## 2012-11-03 ENCOUNTER — Ambulatory Visit (HOSPITAL_COMMUNITY)
Admission: RE | Admit: 2012-11-03 | Discharge: 2012-11-03 | Disposition: A | Discharge status: Home / Self Care | Payer: Medicare Other | Source: Ambulatory Visit | Attending: Cardiothoracic Surgery | Admitting: Cardiothoracic Surgery

## 2012-11-03 ENCOUNTER — Ambulatory Visit (HOSPITAL_COMMUNITY)
Admission: RE | Admit: 2012-11-03 | Discharge: 2012-11-03 | Disposition: A | Discharge status: Home / Self Care | Payer: Medicare Other | Source: Ambulatory Visit | Attending: Radiology | Admitting: Radiology

## 2012-11-03 DIAGNOSIS — J90 Pleural effusion, not elsewhere classified: Secondary | ICD-10-CM | POA: Diagnosis not present

## 2012-11-03 NOTE — Procedures (Signed)
Successful US guided left thoracentesis. Yielded 1.3L of clear yellow fluid. Pt tolerated procedure well. No immediate complications.  Specimen was not sent for labs. CXR ordered.  Brayton El PA-C 11/03/2012 1:39 PM

## 2012-11-11 DIAGNOSIS — I5031 Acute diastolic (congestive) heart failure: Secondary | ICD-10-CM | POA: Diagnosis not present

## 2012-11-11 DIAGNOSIS — I97190 Other postprocedural cardiac functional disturbances following cardiac surgery: Secondary | ICD-10-CM | POA: Diagnosis not present

## 2012-11-11 DIAGNOSIS — Z952 Presence of prosthetic heart valve: Secondary | ICD-10-CM | POA: Diagnosis not present

## 2012-11-11 DIAGNOSIS — Z79899 Other long term (current) drug therapy: Secondary | ICD-10-CM | POA: Diagnosis not present

## 2012-11-11 DIAGNOSIS — I10 Essential (primary) hypertension: Secondary | ICD-10-CM | POA: Diagnosis not present

## 2012-11-11 DIAGNOSIS — E782 Mixed hyperlipidemia: Secondary | ICD-10-CM | POA: Diagnosis not present

## 2012-11-11 DIAGNOSIS — I4891 Unspecified atrial fibrillation: Secondary | ICD-10-CM | POA: Diagnosis not present

## 2012-11-11 DIAGNOSIS — Z7901 Long term (current) use of anticoagulants: Secondary | ICD-10-CM | POA: Diagnosis not present

## 2012-11-16 ENCOUNTER — Other Ambulatory Visit: Payer: Self-pay | Admitting: *Deleted

## 2012-11-16 DIAGNOSIS — I2510 Atherosclerotic heart disease of native coronary artery without angina pectoris: Secondary | ICD-10-CM

## 2012-11-17 DIAGNOSIS — Z01810 Encounter for preprocedural cardiovascular examination: Secondary | ICD-10-CM | POA: Diagnosis not present

## 2012-11-18 ENCOUNTER — Ambulatory Visit: Payer: Self-pay | Admitting: Cardiothoracic Surgery

## 2012-11-23 ENCOUNTER — Encounter: Payer: Self-pay | Admitting: Cardiothoracic Surgery

## 2012-11-23 ENCOUNTER — Ambulatory Visit
Admission: RE | Admit: 2012-11-23 | Discharge: 2012-11-23 | Disposition: A | Discharge status: Home / Self Care | Payer: Medicare Other | Source: Ambulatory Visit | Attending: Cardiothoracic Surgery | Admitting: Cardiothoracic Surgery

## 2012-11-23 ENCOUNTER — Ambulatory Visit (INDEPENDENT_AMBULATORY_CARE_PROVIDER_SITE_OTHER): Payer: Self-pay | Admitting: Cardiothoracic Surgery

## 2012-11-23 ENCOUNTER — Other Ambulatory Visit: Payer: Self-pay

## 2012-11-23 VITALS — BP 135/63 | HR 65 | Resp 16 | Ht 67.0 in | Wt 173.0 lb

## 2012-11-23 DIAGNOSIS — J90 Pleural effusion, not elsewhere classified: Secondary | ICD-10-CM

## 2012-11-23 DIAGNOSIS — Z954 Presence of other heart-valve replacement: Secondary | ICD-10-CM

## 2012-11-23 DIAGNOSIS — I359 Nonrheumatic aortic valve disorder, unspecified: Secondary | ICD-10-CM

## 2012-11-23 DIAGNOSIS — I2510 Atherosclerotic heart disease of native coronary artery without angina pectoris: Secondary | ICD-10-CM

## 2012-11-23 DIAGNOSIS — J9819 Other pulmonary collapse: Secondary | ICD-10-CM | POA: Diagnosis not present

## 2012-11-23 DIAGNOSIS — Z951 Presence of aortocoronary bypass graft: Secondary | ICD-10-CM

## 2012-11-23 DIAGNOSIS — I350 Nonrheumatic aortic (valve) stenosis: Secondary | ICD-10-CM

## 2012-11-23 DIAGNOSIS — Z952 Presence of prosthetic heart valve: Secondary | ICD-10-CM

## 2012-11-23 NOTE — Progress Notes (Signed)
[address]            [place] [address]          [phone]     ARSHAD OBERHOLZER [facility] Medical Record #[id number] Date of Birth: 1924/09/28  [name], MD [name], MD  Chief Complaint:   PostOp Follow Up Visit 09/17/2012   OPERATIVE REPORT  PREOPERATIVE DIAGNOSIS: Critical left main and critical aortic  stenosis.  POSTOPERATIVE DIAGNOSIS: Critical left main and critical aortic  stenosis.  SURGICAL PROCEDURE: Aortic valve replacement with a pericardial tissue  valve, [employer] 23 mm #[id number] and coronary artery bypass grafting x3  with the left internal mammary to the left anterior descending coronary  artery, reverse saphenous vein graft to the circumflex coronary artery,  and reverse saphenous vein graft to the distal right coronary artery  with right leg endo vein harvesting.   History of Present Illness:     Patient returns to the office in followup after recent aortic valve replacement and coronary artery bypass grafting for critical aortic stenosis and severe left main disease. Since seen last week t appears to be staying in sinus rhythm and overall patient has improved. He is less dyspneic. He   still has lower extremity  edema but it is better than it was previously. He is able to tolerate a low level of exercise now. On January 29 a left thoracentesis was done 1.3 L was withdrawn. The patient returns today with followup chest x-ray. Overall he feels improved but does still have dyspnea with exertion. He is now on aspirin, xarelto has been discontinued/    History  Smoking status  . Never Smoker   Smokeless tobacco  . Never Used       No Known Allergies  Current Outpatient Prescriptions  Medication Sig Dispense Refill  . amiodarone (PACERONE) 200 MG tablet Take 200 mg by mouth daily.      Marland Kitchen aspirin 81 MG tablet Take 81 mg by mouth daily.      . famotidine (PEPCID)  20 MG tablet Take 20 mg by mouth daily.      . metoprolol tartrate (LOPRESSOR) 25 MG tablet Take 1 tablet (25 mg total) by mouth 2 (two) times daily.  60 tablet  1  . potassium chloride SA (K-DUR,KLOR-CON) 20 MEQ tablet Take 20 mEq by mouth daily.      . simvastatin (ZOCOR) 20 MG tablet Take 10 mg by mouth every evening.       . traMADol (ULTRAM) 50 MG tablet Take 1 tablet (50 mg total) by mouth every 6 (six) hours as needed for pain.  30 tablet  0  . furosemide (LASIX) 40 MG tablet Take 2 tablets (80 mg total) by mouth 2 (two) times daily.  30 tablet  1   No current facility-administered medications for this visit.       Physical Exam: BP 135/63  Pulse 65  Resp 16  Ht 5\' 7"  (1.702 m)  Wt 173 lb (78.472 kg)  BMI 27.09 kg/m2  SpO2 97%  General appearance: alert, cooperative, appears stated age and no distress Neurologic: intact Heart: regular rate and rhythm and friction rub heard left sternal upper left upper sternal border Lungs: diminished breath  sounds LLL Abdomen: soft, non-tender; bowel sounds normal; no masses,  no organomegaly Extremities: Patient had significant edema below the knees bilaterally with raised erythematous rash around the ankles bilaterally . The dermatitis involving his lower legs has almost completely resolved  Wound: His sternum is stable and well healed  Wounds: The vein harvest sites in the right proximal thigh are well-healed without evidence of infection  Diagnostic Studies & Laboratory data:         Recent Radiology Findings: Dg Chest 2 View  11/23/2012  *RADIOLOGY REPORT*  Clinical Data: Bypass surgery.  CHEST - 2 VIEW  Comparison: 11/03/2012.  Findings: There is an enlarging left pleural effusion with overlying atelectasis.  The right pleural effusion has resolved. No pulmonary edema or pneumothorax.  IMPRESSION: Slight enlargement of left pleural effusion with overlying atelectasis.   Original Report Authenticated By: Rudie Meyer, M.D.    Dg  Chest 2 View  10/28/2012  *RADIOLOGY REPORT*  Clinical Data: Left chest tightness coronary artery disease post CABG and AVR, hypertension  CHEST - 2 VIEW  Comparison: 10/21/2012  Findings: Minimal enlargement of cardiac silhouette post CABG and AVR. Mitral annular calcification. Atherosclerotic calcification aorta. Pulmonary vascularity normal. Bibasilar effusions and atelectasis left greater than right similar to previous exam. Underlying emphysematous changes. Upper lungs clear. No pneumothorax or acute osseous findings.  IMPRESSION: Minimal enlargement of cardiac silhouette post CABG and AVR. Persistent bibasilar effusions and atelectasis left greater than right. Underlying COPD suspected.   Original Report Authenticated By: Ulyses Southward, M.D.    Recent Labs: Lab Results  Component Value Date   WBC 10.0 09/22/2012   HGB 9.3* 09/22/2012   HCT 27.0* 09/22/2012   PLT 169 09/22/2012   GLUCOSE 109* 09/23/2012   ALT 12 09/16/2012   AST 22 09/16/2012   NA 136 09/23/2012   K 3.8 09/23/2012   CL 103 09/23/2012   CREATININE 1.24 09/23/2012   BUN 27* 09/23/2012   CO2 24 09/23/2012   INR 2.00* 09/17/2012   HGBA1C 6.3* 09/16/2012      Assessment / Plan:    Overall the patient has shown some improvement, he continues on high dose diuretic The echocardiogram did not show any malfunction of his prosthetic valve. He still has a moderate left pleural effusion that has increased slightly , even after thoracentesis . We will plan to proceed with repeat  US guided thorcentesis. He appears to be in sinus rhythm today, hopefully with continued diuresis he will continue to show improvement. I plan to see him back in two weeks with chest xray    Calvin Chura B 11/23/2012 3:01 PM

## 2012-11-26 ENCOUNTER — Ambulatory Visit (HOSPITAL_COMMUNITY)
Admission: RE | Admit: 2012-11-26 | Discharge: 2012-11-26 | Disposition: A | Discharge status: Home / Self Care | Payer: Medicare Other | Source: Ambulatory Visit | Attending: Cardiothoracic Surgery | Admitting: Cardiothoracic Surgery

## 2012-11-26 ENCOUNTER — Ambulatory Visit (HOSPITAL_COMMUNITY)
Admission: RE | Admit: 2012-11-26 | Discharge: 2012-11-26 | Disposition: A | Discharge status: Home / Self Care | Payer: Medicare Other | Source: Ambulatory Visit | Attending: Radiology | Admitting: Radiology

## 2012-11-26 VITALS — BP 99/42

## 2012-11-26 DIAGNOSIS — J90 Pleural effusion, not elsewhere classified: Secondary | ICD-10-CM | POA: Diagnosis not present

## 2012-11-26 DIAGNOSIS — Z951 Presence of aortocoronary bypass graft: Secondary | ICD-10-CM | POA: Diagnosis not present

## 2012-11-26 NOTE — Procedures (Signed)
US guided therapeutic left thoracentesis performed yielding 1.2 liters yellow fluid. F/u CXR pending. No immediate complications.  

## 2012-12-02 ENCOUNTER — Encounter (HOSPITAL_COMMUNITY)
Admission: RE | Admit: 2012-12-02 | Discharge: 2012-12-02 | Disposition: A | Discharge status: Home / Self Care | Payer: Medicare Other | Source: Ambulatory Visit | Attending: Interventional Cardiology | Admitting: Interventional Cardiology

## 2012-12-02 ENCOUNTER — Ambulatory Visit (HOSPITAL_COMMUNITY): Payer: Medicare Other

## 2012-12-02 NOTE — Progress Notes (Signed)
Cardiac Rehab Medication Review by a Pharmacist  Does the patient  feel that his/her medications are working for him/her?  yes  Has the patient been experiencing any side effects to the medications prescribed?  no  Does the patient measure his/her own blood pressure or blood glucose at home?  yes ; patient's daughter assists (med tech)  Does the patient have any problems obtaining medications due to transportation or finances?   no  Understanding of regimen: good Understanding of indications: good Potential of compliance: good    Pharmacist comments: Patient's wife assists as does his daughter.    [name] 12/02/2012 8:34 AM

## 2012-12-03 DIAGNOSIS — Z952 Presence of prosthetic heart valve: Secondary | ICD-10-CM | POA: Diagnosis not present

## 2012-12-03 DIAGNOSIS — I4891 Unspecified atrial fibrillation: Secondary | ICD-10-CM | POA: Diagnosis not present

## 2012-12-03 DIAGNOSIS — J90 Pleural effusion, not elsewhere classified: Secondary | ICD-10-CM | POA: Diagnosis not present

## 2012-12-03 DIAGNOSIS — I10 Essential (primary) hypertension: Secondary | ICD-10-CM | POA: Diagnosis not present

## 2012-12-03 DIAGNOSIS — Z79899 Other long term (current) drug therapy: Secondary | ICD-10-CM | POA: Diagnosis not present

## 2012-12-03 DIAGNOSIS — E119 Type 2 diabetes mellitus without complications: Secondary | ICD-10-CM | POA: Diagnosis not present

## 2012-12-07 ENCOUNTER — Other Ambulatory Visit: Payer: Self-pay | Admitting: *Deleted

## 2012-12-07 DIAGNOSIS — I2510 Atherosclerotic heart disease of native coronary artery without angina pectoris: Secondary | ICD-10-CM

## 2012-12-09 ENCOUNTER — Ambulatory Visit
Admission: RE | Admit: 2012-12-09 | Discharge: 2012-12-09 | Disposition: A | Discharge status: Home / Self Care | Payer: Medicare Other | Source: Ambulatory Visit | Attending: Cardiothoracic Surgery | Admitting: Cardiothoracic Surgery

## 2012-12-09 ENCOUNTER — Encounter: Payer: Self-pay | Admitting: Cardiothoracic Surgery

## 2012-12-09 ENCOUNTER — Ambulatory Visit (HOSPITAL_COMMUNITY): Payer: Medicare Other

## 2012-12-09 ENCOUNTER — Ambulatory Visit (INDEPENDENT_AMBULATORY_CARE_PROVIDER_SITE_OTHER): Payer: Self-pay | Admitting: Cardiothoracic Surgery

## 2012-12-09 ENCOUNTER — Other Ambulatory Visit: Payer: Self-pay | Admitting: *Deleted

## 2012-12-09 VITALS — BP 119/54 | HR 46 | Resp 18 | Ht 67.5 in | Wt 172.0 lb

## 2012-12-09 DIAGNOSIS — Z951 Presence of aortocoronary bypass graft: Secondary | ICD-10-CM

## 2012-12-09 DIAGNOSIS — I2510 Atherosclerotic heart disease of native coronary artery without angina pectoris: Secondary | ICD-10-CM

## 2012-12-09 DIAGNOSIS — J9819 Other pulmonary collapse: Secondary | ICD-10-CM | POA: Diagnosis not present

## 2012-12-09 DIAGNOSIS — J90 Pleural effusion, not elsewhere classified: Secondary | ICD-10-CM | POA: Diagnosis not present

## 2012-12-09 DIAGNOSIS — Z952 Presence of prosthetic heart valve: Secondary | ICD-10-CM

## 2012-12-09 DIAGNOSIS — Z954 Presence of other heart-valve replacement: Secondary | ICD-10-CM

## 2012-12-09 NOTE — Progress Notes (Signed)
[address]       [place] [address]             [phone]                                 JEORGE REISTER [facility] Medical Record #[id number] Date of Birth: 1924-09-20  [name], MD [name], MD Dr [name]  Chief Complaint:   PostOp Follow Up Visit 09/17/2012   OPERATIVE REPORT  PREOPERATIVE DIAGNOSIS: Critical left main and critical aortic  stenosis.  POSTOPERATIVE DIAGNOSIS: Critical left main and critical aortic  stenosis.  SURGICAL PROCEDURE: Aortic valve replacement with a pericardial tissue  valve, [employer] 23 mm #[id number] and coronary artery bypass grafting x3  with the left internal mammary to the left anterior descending coronary  artery, reverse saphenous vein graft to the circumflex coronary artery,  and reverse saphenous vein graft to the distal right coronary artery  with right leg endo vein harvesting.   History of Present Illness:     Patient returns to the office in followup after recent aortic valve replacement and coronary artery bypass grafting for critical aortic stenosis and severe left main disease. Since seen 2 weeks  appears to be staying in sinus rhythm and overall patient has improved. He is less dyspneic. His  lower extremity  edema is better than it was previously. He is able to tolerate a low level of exercise now and starting cardiac rehab tomorrow. The patient returns today with followup chest x-ray.   Follow up xray today after repeat thoracentesis was done two weeks ago   History  Smoking status  . Never Smoker   Smokeless tobacco  . Never Used       No Known Allergies  Current Outpatient Prescriptions  Medication Sig Dispense Refill  . amiodarone (PACERONE) 200 MG tablet Take 200 mg by mouth daily.      Marland Kitchen aspirin 81 MG tablet Take 81 mg by mouth daily.      . famotidine (PEPCID) 20 MG tablet Take 20 mg by mouth daily.      . furosemide (LASIX) 80 MG tablet Take 80 mg by mouth 2  (two) times daily.      . metoprolol tartrate (LOPRESSOR) 25 MG tablet Take 1 tablet (25 mg total) by mouth 2 (two) times daily.  60 tablet  1  . potassium chloride SA (K-DUR,KLOR-CON) 20 MEQ tablet Take 20 mEq by mouth daily.      . simvastatin (ZOCOR) 20 MG tablet Take 10 mg by mouth every evening.       . traMADol (ULTRAM) 50 MG tablet Take 1 tablet (50 mg total) by mouth every 6 (six) hours as needed for pain.  30 tablet  0   No current facility-administered medications for this visit.       Physical Exam: BP 119/54  Pulse 46  Resp 18  Ht 5' 7.5" (1.715 m)  Wt 172 lb (78.019 kg)  BMI 26.53 kg/m2  SpO2 98%  General appearance: alert, cooperative, appears stated age and no distress Neurologic: intact Heart: regular rate and rhythm and friction rub heard left sternal upper left upper sternal border Lungs: diminished breath sounds LLL Abdomen: soft, non-tender; bowel sounds normal; no masses,  no organomegaly Extremities: Patient had significant edema below the knees bilaterally with raised erythematous rash around the ankles bilaterally . The dermatitis involving his lower legs  has almost completely resolved  Wound: His sternum is stable and well healed  Wounds: The vein harvest sites in the right proximal thigh are well-healed without evidence of infection  Diagnostic Studies & Laboratory data:         Recent Radiology Findings: Dg Chest 2 View  12/09/2012  *RADIOLOGY REPORT*  Clinical Data: History of CABG, follow-up  CHEST - 2 VIEW  Comparison: Chest x-ray of 11/26/2012  Findings: There is more opacity at the left lung base consistent with interval increase in left pleural effusion and left basilar atelectasis.  The right lung is clear.  Cardiomegaly is stable. Median sternotomy sutures are noted from CABG and aortic valve replacement.  IMPRESSION: Increase in opacity at the left lung base consistent with increase in left pleural effusion and left basilar atelectasis.   Original  Report Authenticated By: Dwyane Dee, M.D.    Dg Chest 2 View  10/28/2012  *RADIOLOGY REPORT*  Clinical Data: Left chest tightness coronary artery disease post CABG and AVR, hypertension  CHEST - 2 VIEW  Comparison: 10/21/2012  Findings: Minimal enlargement of cardiac silhouette post CABG and AVR. Mitral annular calcification. Atherosclerotic calcification aorta. Pulmonary vascularity normal. Bibasilar effusions and atelectasis left greater than right similar to previous exam. Underlying emphysematous changes. Upper lungs clear. No pneumothorax or acute osseous findings.  IMPRESSION: Minimal enlargement of cardiac silhouette post CABG and AVR. Persistent bibasilar effusions and atelectasis left greater than right. Underlying COPD suspected.   Original Report Authenticated By: Ulyses Southward, M.D.    Recent Labs: Lab Results  Component Value Date   WBC 10.0 09/22/2012   HGB 9.3* 09/22/2012   HCT 27.0* 09/22/2012   PLT 169 09/22/2012   GLUCOSE 109* 09/23/2012   ALT 12 09/16/2012   AST 22 09/16/2012   NA 136 09/23/2012   K 3.8 09/23/2012   CL 103 09/23/2012   CREATININE 1.24 09/23/2012   BUN 27* 09/23/2012   CO2 24 09/23/2012   INR 2.00* 09/17/2012   HGBA1C 6.3* 09/16/2012      Assessment / Plan:    Overall the patient has shown some improvement, he continues on high dose diuretic The echocardiogram did not show any malfunction of his prosthetic valve. He still has a moderate left pleural effusion that has increased slightly , even after thoracentesis . We will plan to continue lasix and follow up chest xray in 4 weeks He appears to be in sinus rhythm today, hopefully with continued diuresis he will continue to show improvement. I plan to see him back in 4 weeks with chest xray    GERHARDT,EDWARD B 12/09/2012 10:22 AM

## 2012-12-10 ENCOUNTER — Encounter (HOSPITAL_COMMUNITY): Payer: Medicare Other

## 2012-12-13 ENCOUNTER — Encounter (HOSPITAL_COMMUNITY): Admission: RE | Admit: 2012-12-13 | Payer: Medicare Other | Source: Ambulatory Visit

## 2012-12-13 ENCOUNTER — Encounter (HOSPITAL_COMMUNITY): Payer: Medicare Other

## 2012-12-15 ENCOUNTER — Encounter (HOSPITAL_COMMUNITY)
Admission: RE | Admit: 2012-12-15 | Discharge: 2012-12-15 | Disposition: A | Discharge status: Home / Self Care | Payer: Medicare Other | Source: Ambulatory Visit | Attending: Interventional Cardiology | Admitting: Interventional Cardiology

## 2012-12-15 ENCOUNTER — Encounter (HOSPITAL_COMMUNITY): Payer: Medicare Other

## 2012-12-15 DIAGNOSIS — I2510 Atherosclerotic heart disease of native coronary artery without angina pectoris: Secondary | ICD-10-CM | POA: Diagnosis not present

## 2012-12-15 DIAGNOSIS — Z5189 Encounter for other specified aftercare: Secondary | ICD-10-CM | POA: Diagnosis not present

## 2012-12-15 NOTE — Progress Notes (Signed)
**Note De-Identified  Obfuscation** Kole is present for exercise today at Cardiac rehab.  Patient placed on the monitor.  Telemetry rhythm Sinus Huston Foley with a first degree heart block.  Heart rate noted at 38- low 40's at rest Patient complained of feeling lightheaded and sometimes feels lightheaded at home when changing from sitting to standing positions. Mr Kassim did not exercise today due to his symptoms.  Sa02 98% on Room Air.  Blood pressure 110/60.  Exercise flow sheets faxed to Dr [name] office for review.  Dr [name] reviewed ECG tracings.  Marjean Donna, Dr [facility] nurse called back and instructed Mrs Rajewski to decrease Mr Baptista's metoprolol to 12.5 twice a week for a week and then he is to stop his betablocker. Mr Nauta is to continue his other medications. Dr [name] said Mr Fiorella can begin exercise at cardiac rehab on Friday,  Mr Dresser left cardiac rehab without complaints. Mrs Tippen drove the patient home today.

## 2012-12-17 ENCOUNTER — Encounter (HOSPITAL_COMMUNITY)
Admission: RE | Admit: 2012-12-17 | Discharge: 2012-12-17 | Disposition: A | Discharge status: Home / Self Care | Payer: Medicare Other | Source: Ambulatory Visit | Attending: Interventional Cardiology | Admitting: Interventional Cardiology

## 2012-12-17 ENCOUNTER — Encounter (HOSPITAL_COMMUNITY): Payer: Medicare Other

## 2012-12-17 LAB — GLUCOSE, CAPILLARY: Glucose-Capillary: 139 mg/dL — ABNORMAL HIGH (ref 70–99)

## 2012-12-17 NOTE — Progress Notes (Signed)
Pt returned to cardiac rehab for his first day of exercise. Pt advised to cut his metoprolol to 12.5 mg bid for one week then d/c.  Monitor showed sb/sr with first degree av block.  Pt with some dizziness at the start of exercise.  Pt given gatorade with improvement of symptoms. Pt tolerated light exercise with no complaints.  Pt with multiple trips to the bathroom due to taking his lasix prior to exercise.  Pt will plan to take his Lasix after exercise.  Pt and wife verbalized understanding. Pt remarked at the end of exercise that he has experienced some dizziness off and on particularly when he moves quickly.  Will fax rehab report and strips to Dr. [name] for review

## 2012-12-20 ENCOUNTER — Encounter (HOSPITAL_COMMUNITY)
Admission: RE | Admit: 2012-12-20 | Discharge: 2012-12-20 | Disposition: A | Discharge status: Home / Self Care | Payer: Medicare Other | Source: Ambulatory Visit | Attending: Interventional Cardiology | Admitting: Interventional Cardiology

## 2012-12-20 ENCOUNTER — Encounter (HOSPITAL_COMMUNITY): Payer: Medicare Other

## 2012-12-20 LAB — GLUCOSE, CAPILLARY
Glucose-Capillary: 162 mg/dL — ABNORMAL HIGH (ref 70–99)
Glucose-Capillary: 109 mg/dL — ABNORMAL HIGH (ref 70–99)

## 2012-12-22 ENCOUNTER — Ambulatory Visit (HOSPITAL_COMMUNITY)
Admission: RE | Admit: 2012-12-22 | Discharge: 2012-12-22 | Disposition: A | Discharge status: Home / Self Care | Payer: Medicare Other | Source: Ambulatory Visit | Attending: Cardiothoracic Surgery | Admitting: Cardiothoracic Surgery

## 2012-12-22 ENCOUNTER — Other Ambulatory Visit: Payer: Self-pay | Admitting: *Deleted

## 2012-12-22 ENCOUNTER — Encounter (HOSPITAL_COMMUNITY): Payer: Medicare Other

## 2012-12-22 ENCOUNTER — Encounter (HOSPITAL_COMMUNITY)
Admission: RE | Admit: 2012-12-22 | Discharge: 2012-12-22 | Disposition: A | Discharge status: Home / Self Care | Payer: Medicare Other | Source: Ambulatory Visit | Attending: Interventional Cardiology | Admitting: Interventional Cardiology

## 2012-12-22 ENCOUNTER — Telehealth: Payer: Self-pay | Admitting: *Deleted

## 2012-12-22 DIAGNOSIS — J90 Pleural effusion, not elsewhere classified: Secondary | ICD-10-CM | POA: Diagnosis not present

## 2012-12-22 DIAGNOSIS — I4891 Unspecified atrial fibrillation: Secondary | ICD-10-CM | POA: Insufficient documentation

## 2012-12-22 DIAGNOSIS — Z951 Presence of aortocoronary bypass graft: Secondary | ICD-10-CM | POA: Diagnosis not present

## 2012-12-22 DIAGNOSIS — Z954 Presence of other heart-valve replacement: Secondary | ICD-10-CM | POA: Insufficient documentation

## 2012-12-22 DIAGNOSIS — R5381 Other malaise: Secondary | ICD-10-CM | POA: Diagnosis not present

## 2012-12-22 DIAGNOSIS — R0602 Shortness of breath: Secondary | ICD-10-CM | POA: Insufficient documentation

## 2012-12-22 LAB — GLUCOSE, CAPILLARY
Glucose-Capillary: 144 mg/dL — ABNORMAL HIGH (ref 70–99)
Glucose-Capillary: 119 mg/dL — ABNORMAL HIGH (ref 70–99)

## 2012-12-22 NOTE — Progress Notes (Signed)
Patient reports feeling like he has fluid on his lung.  Patient pointed to his left side.  Denies SOB this morning.  Upon assessment lung fields with diminished breath sounds in the posterior bases.  Dr Dennie Maizes office called and notified.  Spoke with Sherre Poot RN to notify of patients complaints.  SA02 100% on room air.  Dr Tyrone Sage wants [name] to have a chest xray today.  Patient taken to admitting and radiology via wheelchair.  [name] was noted at 23  during cool down Sinus Huston Foley.  Patient asymptomatic.  Faxed today's ECG tracing for Dr [name] to review.  I spoke to [name] about his medication.  Today is his last day to take his betablocker.

## 2012-12-22 NOTE — Progress Notes (Signed)
Zedrick had his chest xray completed Dr Dennie Maizes office notified.  Exit heart rate 65

## 2012-12-22 NOTE — Telephone Encounter (Signed)
Byrd Hesselbach called from cardiac rehab stating that Mr. Horacek "feels like the fluid is coming back. It feels heavy when I lay down on my side."  I consulted Dr. Tyrone Sage and a chest xray was ordered to be done after rehab.  This was done, reviewed by Dr. Tyrone Sage and the result was called to his wife.  The left effusion may be slightly decreased and/or the same.  Continue as before and we will see you as scheduled.  She understands. He will notify us if he becomes short of breath or will go to the ER.

## 2012-12-24 ENCOUNTER — Encounter (HOSPITAL_COMMUNITY): Payer: Medicare Other

## 2012-12-24 ENCOUNTER — Encounter (HOSPITAL_COMMUNITY)
Admission: RE | Admit: 2012-12-24 | Discharge: 2012-12-24 | Disposition: A | Discharge status: Home / Self Care | Payer: Medicare Other | Source: Ambulatory Visit | Attending: Interventional Cardiology | Admitting: Interventional Cardiology

## 2012-12-24 LAB — GLUCOSE, CAPILLARY: Glucose-Capillary: 118 mg/dL — ABNORMAL HIGH (ref 70–99)

## 2012-12-27 ENCOUNTER — Encounter (HOSPITAL_COMMUNITY): Payer: Medicare Other

## 2012-12-27 ENCOUNTER — Encounter (HOSPITAL_COMMUNITY)
Admission: RE | Admit: 2012-12-27 | Discharge: 2012-12-27 | Disposition: A | Discharge status: Home / Self Care | Payer: Medicare Other | Source: Ambulatory Visit | Attending: Interventional Cardiology | Admitting: Interventional Cardiology

## 2012-12-27 LAB — GLUCOSE, CAPILLARY: Glucose-Capillary: 141 mg/dL — ABNORMAL HIGH (ref 70–99)

## 2012-12-27 NOTE — Progress Notes (Signed)
[name] Common 79 y.o. male Nutrition Note Spoke with pt briefly. Primarily spoke with pt's wife per pt request.  Nutrition Plan and Nutrition Survey goals reviewed with pt. Pt is following Step 2 of the Therapeutic Lifestyle Changes diet. Age-appropriate heart healthy diet discussed. Pt is a diet-controlled diabetic. Last A1c indicates blood glucose well-controlled. This Clinical research associate went over Diabetes Education test results. Pt expressed understanding of the information reviewed. Pt aware of nutrition education classes offered and plans on attending nutrition classes.  Nutrition Diagnosis   Food-and nutrition-related knowledge deficit related to lack of exposure to information as related to diagnosis of: ? CVD ? DM (A1c 6.3)   Overweight related to excessive energy intake as evidenced by a BMI of 26.6  Nutrition RX/ Estimated Daily Nutrition Needs for: wt maintenance 2000-2300 Kcal, 65-75 gm fat, 13-15 gm sat fat, 2.0-2.3 gm trans-fat, <1500 mg sodium, 250-325 gm CHO   Nutrition Intervention   Pt's individual nutrition plan reviewed with pt.   Benefits of adopting Therapeutic Lifestyle Changes discussed when Medficts reviewed.   Pt to attend the Portion Distortion class   Pt to attend the  ? Nutrition I class                     ? Nutrition II class        ? Diabetes Blitz class       ? Diabetes Q & A class - met 12/17/12   Continue client-centered nutrition education by RD, as part of interdisciplinary care. Goal(s)   Pt to describe the benefit of including fruits, vegetables, whole grains, and low-fat dairy products in a heart healthy meal plan. Monitor and Evaluate progress toward nutrition goal with team. Nutrition Risk: Change to Moderate  [name], M.Ed, RD, LDN, CDE 12/27/2012 10:43 AM

## 2012-12-27 NOTE — Progress Notes (Signed)
[name]'s metoprolol was discontinued last week per Dr [name].  Heart rate improved.  [name] has been in Sinus rhythm with a first degree heart block.Will continue to monitor the patient throughout  the program.

## 2012-12-29 ENCOUNTER — Encounter (HOSPITAL_COMMUNITY)
Admission: RE | Admit: 2012-12-29 | Discharge: 2012-12-29 | Disposition: A | Discharge status: Home / Self Care | Payer: Medicare Other | Source: Ambulatory Visit | Attending: Interventional Cardiology | Admitting: Interventional Cardiology

## 2012-12-29 ENCOUNTER — Encounter (HOSPITAL_COMMUNITY): Payer: Medicare Other

## 2012-12-31 ENCOUNTER — Encounter (HOSPITAL_COMMUNITY): Payer: Medicare Other

## 2012-12-31 ENCOUNTER — Encounter (HOSPITAL_COMMUNITY)
Admission: RE | Admit: 2012-12-31 | Discharge: 2012-12-31 | Disposition: A | Discharge status: Home / Self Care | Payer: Medicare Other | Source: Ambulatory Visit | Attending: Interventional Cardiology | Admitting: Interventional Cardiology

## 2013-01-03 ENCOUNTER — Encounter (HOSPITAL_COMMUNITY): Payer: Medicare Other

## 2013-01-03 ENCOUNTER — Encounter (HOSPITAL_COMMUNITY)
Admission: RE | Admit: 2013-01-03 | Discharge: 2013-01-03 | Disposition: A | Discharge status: Home / Self Care | Payer: Medicare Other | Source: Ambulatory Visit | Attending: Interventional Cardiology | Admitting: Interventional Cardiology

## 2013-01-05 ENCOUNTER — Encounter (HOSPITAL_COMMUNITY)
Admission: RE | Admit: 2013-01-05 | Discharge: 2013-01-05 | Disposition: A | Discharge status: Home / Self Care | Payer: Medicare Other | Source: Ambulatory Visit | Attending: Interventional Cardiology | Admitting: Interventional Cardiology

## 2013-01-05 ENCOUNTER — Encounter (HOSPITAL_COMMUNITY): Payer: Medicare Other

## 2013-01-05 DIAGNOSIS — I359 Nonrheumatic aortic valve disorder, unspecified: Secondary | ICD-10-CM | POA: Diagnosis not present

## 2013-01-05 DIAGNOSIS — I2510 Atherosclerotic heart disease of native coronary artery without angina pectoris: Secondary | ICD-10-CM | POA: Diagnosis not present

## 2013-01-05 DIAGNOSIS — Z5189 Encounter for other specified aftercare: Secondary | ICD-10-CM | POA: Insufficient documentation

## 2013-01-06 ENCOUNTER — Ambulatory Visit (INDEPENDENT_AMBULATORY_CARE_PROVIDER_SITE_OTHER): Payer: Medicare Other | Admitting: Cardiothoracic Surgery

## 2013-01-06 ENCOUNTER — Ambulatory Visit
Admission: RE | Admit: 2013-01-06 | Discharge: 2013-01-06 | Disposition: A | Discharge status: Home / Self Care | Payer: Medicare Other | Source: Ambulatory Visit | Attending: Cardiothoracic Surgery | Admitting: Cardiothoracic Surgery

## 2013-01-06 ENCOUNTER — Encounter: Payer: Self-pay | Admitting: Cardiothoracic Surgery

## 2013-01-06 VITALS — BP 140/68 | HR 68 | Resp 20 | Ht 67.5 in | Wt 166.0 lb

## 2013-01-06 DIAGNOSIS — Z951 Presence of aortocoronary bypass graft: Secondary | ICD-10-CM

## 2013-01-06 DIAGNOSIS — Z954 Presence of other heart-valve replacement: Secondary | ICD-10-CM | POA: Diagnosis not present

## 2013-01-06 DIAGNOSIS — Z952 Presence of prosthetic heart valve: Secondary | ICD-10-CM

## 2013-01-06 DIAGNOSIS — J984 Other disorders of lung: Secondary | ICD-10-CM | POA: Diagnosis not present

## 2013-01-06 DIAGNOSIS — J90 Pleural effusion, not elsewhere classified: Secondary | ICD-10-CM | POA: Diagnosis not present

## 2013-01-06 DIAGNOSIS — I2510 Atherosclerotic heart disease of native coronary artery without angina pectoris: Secondary | ICD-10-CM

## 2013-01-06 NOTE — Progress Notes (Signed)
[address]       [place] [address]             [phone]                                 [name] [facility] Medical Record #[id number] Date of Birth: 21-Jun-1924  [name], MD [name], MD Dr [name]  Chief Complaint:   PostOp Follow Up Visit 09/17/2012   OPERATIVE REPORT  PREOPERATIVE DIAGNOSIS: Critical left main and critical aortic  stenosis.  POSTOPERATIVE DIAGNOSIS: Critical left main and critical aortic  stenosis.  SURGICAL PROCEDURE: Aortic valve replacement with a pericardial tissue  valve, [employer] 23 mm #[id number] and coronary artery bypass grafting x3  with the left internal mammary to the left anterior descending coronary  artery, reverse saphenous vein graft to the circumflex coronary artery,  and reverse saphenous vein graft to the distal right coronary artery  with right leg endo vein harvesting.   History of Present Illness:     Patient returns to the office in followup after recent aortic valve replacement and coronary artery bypass grafting for critical aortic stenosis and severe left main disease. The patient is now in cardiac rehabilitation and notes a marked improvement in his overall stamina and ability to ambulate. He notes that the swelling in his lower extremities continues to be much improved.   History  Smoking status  . Never Smoker   Smokeless tobacco  . Never Used       No Known Allergies  Current Outpatient Prescriptions  Medication Sig Dispense Refill  . amiodarone (PACERONE) 200 MG tablet Take 200 mg by mouth daily.      [name] aspirin 81 MG tablet Take 81 mg by mouth daily.      . famotidine (PEPCID) 20 MG tablet Take 20 mg by mouth daily.      . furosemide (LASIX) 80 MG tablet Take 80 mg by mouth 2 (two) times daily.      . potassium chloride SA (K-DUR,KLOR-CON) 20 MEQ tablet Take 20 mEq by mouth daily.      . simvastatin (ZOCOR) 20 MG tablet Take 10 mg by mouth every  evening.       . traMADol (ULTRAM) 50 MG tablet Take 1 tablet (50 mg total) by mouth every 6 (six) hours as needed for pain.  30 tablet  0   No current facility-administered medications for this visit.       Physical Exam: BP 140/68  Pulse 68  Resp 20  Ht 5' 7.5" (1.715 m)  Wt 166 lb (75.297 kg)  BMI 25.6 kg/m2  SpO2 97%  General appearance: alert, cooperative, appears stated age and no distress Neurologic: intact Heart: regular rate and rhythm and friction rub heard left sternal upper left upper sternal border Lungs: Bilateral breath sounds are present  Abdomen: soft, non-tender; bowel sounds normal; no masses,  no organomegaly Extremities: Patient has mild edema in both lower ankles but much improved from previous exams . The dermatitis involving his lower legs has almost completely resolved  Wound: His sternum is stable and well healed    Diagnostic Studies & Laboratory data:         Recent Radiology Findings: Dg Chest 2 View  01/06/2013  *RADIOLOGY REPORT*  Clinical Data: Follow up of coronary artery disease.  Status post open heart surgery 01/14.  CHEST -  2 VIEW  Comparison: 12/22/2012  Findings: Prior median sternotomy and CABG/aortic valve repair. Moderate thoracic spondylosis. Midline trachea.  Similar small left pleural effusion. No pneumothorax.  No right-sided pleural fluid. Similar adjacent left base atelectasis.  IMPRESSION:  1.  No change since 12/22/2012. 2.  Similar small left pleural effusion with adjacent airspace disease, likely atelectasis.   Original Report Authenticated By: Jeronimo Greaves, M.D.    Dg Chest 2 View  10/28/2012  *RADIOLOGY REPORT*  Clinical Data: Left chest tightness coronary artery disease post CABG and AVR, hypertension  CHEST - 2 VIEW  Comparison: 10/21/2012  Findings: Minimal enlargement of cardiac silhouette post CABG and AVR. Mitral annular calcification. Atherosclerotic calcification aorta. Pulmonary vascularity normal. Bibasilar effusions and  atelectasis left greater than right similar to previous exam. Underlying emphysematous changes. Upper lungs clear. No pneumothorax or acute osseous findings.  IMPRESSION: Minimal enlargement of cardiac silhouette post CABG and AVR. Persistent bibasilar effusions and atelectasis left greater than right. Underlying COPD suspected.   Original Report Authenticated By: Ulyses Southward, M.D.    Recent Labs: Lab Results  Component Value Date   WBC 10.0 09/22/2012   HGB 9.3* 09/22/2012   HCT 27.0* 09/22/2012   PLT 169 09/22/2012   GLUCOSE 109* 09/23/2012   ALT 12 09/16/2012   AST 22 09/16/2012   NA 136 09/23/2012   K 3.8 09/23/2012   CL 103 09/23/2012   CREATININE 1.24 09/23/2012   BUN 27* 09/23/2012   CO2 24 09/23/2012   INR 2.00* 09/17/2012   HGBA1C 6.3* 09/16/2012      Assessment / Plan:    Overall the patient has shown some improvement, was stable chest x-ray findings  Patient continues to improve with cardiac rehabilitation. Chest x-ray today is improved with decreased left pleural effusion. Plan see the patient back in 6 weeks.   He appears to be in sinus rhythm today, hopefully with continued diuresis he will continue to show improvement. I plan to see him back in 4 weeks with chest xray    [name] B 01/06/2013 11:11 AM

## 2013-01-07 ENCOUNTER — Encounter (HOSPITAL_COMMUNITY): Payer: Medicare Other

## 2013-01-07 ENCOUNTER — Encounter (HOSPITAL_COMMUNITY)
Admission: RE | Admit: 2013-01-07 | Discharge: 2013-01-07 | Disposition: A | Discharge status: Home / Self Care | Payer: Medicare Other | Source: Ambulatory Visit | Attending: Interventional Cardiology | Admitting: Interventional Cardiology

## 2013-01-07 LAB — GLUCOSE, CAPILLARY: Glucose-Capillary: 131 mg/dL — ABNORMAL HIGH (ref 70–99)

## 2013-01-10 ENCOUNTER — Encounter (HOSPITAL_COMMUNITY)
Admission: RE | Admit: 2013-01-10 | Discharge: 2013-01-10 | Disposition: A | Discharge status: Home / Self Care | Payer: Medicare Other | Source: Ambulatory Visit | Attending: Interventional Cardiology | Admitting: Interventional Cardiology

## 2013-01-10 ENCOUNTER — Encounter (HOSPITAL_COMMUNITY): Payer: Medicare Other

## 2013-01-10 LAB — GLUCOSE, CAPILLARY: Glucose-Capillary: 174 mg/dL — ABNORMAL HIGH (ref 70–99)

## 2013-01-12 ENCOUNTER — Encounter (HOSPITAL_COMMUNITY): Payer: Medicare Other

## 2013-01-12 ENCOUNTER — Encounter (HOSPITAL_COMMUNITY)
Admission: RE | Admit: 2013-01-12 | Discharge: 2013-01-12 | Disposition: A | Discharge status: Home / Self Care | Payer: Medicare Other | Source: Ambulatory Visit | Attending: Interventional Cardiology | Admitting: Interventional Cardiology

## 2013-01-12 LAB — GLUCOSE, CAPILLARY: Glucose-Capillary: 153 mg/dL — ABNORMAL HIGH (ref 70–99)

## 2013-01-13 DIAGNOSIS — Z961 Presence of intraocular lens: Secondary | ICD-10-CM | POA: Diagnosis not present

## 2013-01-14 ENCOUNTER — Encounter (HOSPITAL_COMMUNITY): Payer: Medicare Other

## 2013-01-14 ENCOUNTER — Encounter (HOSPITAL_COMMUNITY)
Admission: RE | Admit: 2013-01-14 | Discharge: 2013-01-14 | Disposition: A | Discharge status: Home / Self Care | Payer: Medicare Other | Source: Ambulatory Visit | Attending: Interventional Cardiology | Admitting: Interventional Cardiology

## 2013-01-14 DIAGNOSIS — M5430 Sciatica, unspecified side: Secondary | ICD-10-CM | POA: Diagnosis not present

## 2013-01-14 LAB — GLUCOSE, CAPILLARY: Glucose-Capillary: 135 mg/dL — ABNORMAL HIGH (ref 70–99)

## 2013-01-17 ENCOUNTER — Encounter (HOSPITAL_COMMUNITY)
Admission: RE | Admit: 2013-01-17 | Discharge: 2013-01-17 | Disposition: A | Discharge status: Home / Self Care | Payer: Medicare Other | Source: Ambulatory Visit | Attending: Interventional Cardiology | Admitting: Interventional Cardiology

## 2013-01-17 ENCOUNTER — Encounter (HOSPITAL_COMMUNITY): Payer: Medicare Other

## 2013-01-19 ENCOUNTER — Encounter (HOSPITAL_COMMUNITY)
Admission: RE | Admit: 2013-01-19 | Discharge: 2013-01-19 | Disposition: A | Discharge status: Home / Self Care | Payer: Medicare Other | Source: Ambulatory Visit | Attending: Interventional Cardiology | Admitting: Interventional Cardiology

## 2013-01-19 ENCOUNTER — Encounter (HOSPITAL_COMMUNITY): Payer: Medicare Other

## 2013-01-21 ENCOUNTER — Encounter (HOSPITAL_COMMUNITY)
Admission: RE | Admit: 2013-01-21 | Discharge: 2013-01-21 | Disposition: A | Discharge status: Home / Self Care | Payer: Medicare Other | Source: Ambulatory Visit | Attending: Interventional Cardiology | Admitting: Interventional Cardiology

## 2013-01-21 ENCOUNTER — Encounter (HOSPITAL_COMMUNITY): Payer: Medicare Other

## 2013-01-24 ENCOUNTER — Encounter (HOSPITAL_COMMUNITY): Payer: Medicare Other

## 2013-01-24 ENCOUNTER — Encounter (HOSPITAL_COMMUNITY): Admission: RE | Admit: 2013-01-24 | Payer: Medicare Other | Source: Ambulatory Visit

## 2013-01-26 ENCOUNTER — Encounter (HOSPITAL_COMMUNITY)
Admission: RE | Admit: 2013-01-26 | Discharge: 2013-01-26 | Disposition: A | Discharge status: Home / Self Care | Payer: Medicare Other | Source: Ambulatory Visit | Attending: Interventional Cardiology | Admitting: Interventional Cardiology

## 2013-01-26 ENCOUNTER — Encounter (HOSPITAL_COMMUNITY): Payer: Medicare Other

## 2013-01-28 ENCOUNTER — Encounter (HOSPITAL_COMMUNITY): Payer: Medicare Other

## 2013-01-28 ENCOUNTER — Encounter (HOSPITAL_COMMUNITY)
Admission: RE | Admit: 2013-01-28 | Discharge: 2013-01-28 | Disposition: A | Discharge status: Home / Self Care | Payer: Medicare Other | Source: Ambulatory Visit | Attending: Interventional Cardiology | Admitting: Interventional Cardiology

## 2013-01-31 ENCOUNTER — Encounter (HOSPITAL_COMMUNITY)
Admission: RE | Admit: 2013-01-31 | Discharge: 2013-01-31 | Disposition: A | Discharge status: Home / Self Care | Payer: Medicare Other | Source: Ambulatory Visit | Attending: Interventional Cardiology | Admitting: Interventional Cardiology

## 2013-01-31 ENCOUNTER — Encounter (HOSPITAL_COMMUNITY): Payer: Medicare Other

## 2013-02-02 ENCOUNTER — Encounter (HOSPITAL_COMMUNITY)
Admission: RE | Admit: 2013-02-02 | Discharge: 2013-02-02 | Disposition: A | Discharge status: Home / Self Care | Payer: Medicare Other | Source: Ambulatory Visit | Attending: Interventional Cardiology | Admitting: Interventional Cardiology

## 2013-02-02 ENCOUNTER — Encounter (HOSPITAL_COMMUNITY): Payer: Medicare Other

## 2013-02-03 NOTE — Progress Notes (Signed)
Sinus brady 47 noted yesterday during cool down.  Patient asymptomatic. Vital signs stable. Faxed ECG tracing with exercise flow sheets to Dr [name] office for review.

## 2013-02-04 ENCOUNTER — Encounter (HOSPITAL_COMMUNITY)
Admission: RE | Admit: 2013-02-04 | Discharge: 2013-02-04 | Disposition: A | Discharge status: Home / Self Care | Payer: Medicare Other | Source: Ambulatory Visit | Attending: Interventional Cardiology | Admitting: Interventional Cardiology

## 2013-02-04 ENCOUNTER — Encounter (HOSPITAL_COMMUNITY): Payer: Medicare Other

## 2013-02-04 DIAGNOSIS — I359 Nonrheumatic aortic valve disorder, unspecified: Secondary | ICD-10-CM | POA: Insufficient documentation

## 2013-02-04 DIAGNOSIS — I2510 Atherosclerotic heart disease of native coronary artery without angina pectoris: Secondary | ICD-10-CM | POA: Insufficient documentation

## 2013-02-04 DIAGNOSIS — Z5189 Encounter for other specified aftercare: Secondary | ICD-10-CM | POA: Insufficient documentation

## 2013-02-07 ENCOUNTER — Encounter (HOSPITAL_COMMUNITY)
Admission: RE | Admit: 2013-02-07 | Discharge: 2013-02-07 | Disposition: A | Discharge status: Home / Self Care | Payer: Medicare Other | Source: Ambulatory Visit | Attending: Interventional Cardiology | Admitting: Interventional Cardiology

## 2013-02-07 ENCOUNTER — Encounter (HOSPITAL_COMMUNITY): Payer: Medicare Other

## 2013-02-09 ENCOUNTER — Encounter (HOSPITAL_COMMUNITY): Payer: Medicare Other

## 2013-02-09 ENCOUNTER — Encounter (HOSPITAL_COMMUNITY)
Admission: RE | Admit: 2013-02-09 | Discharge: 2013-02-09 | Disposition: A | Discharge status: Home / Self Care | Payer: Medicare Other | Source: Ambulatory Visit | Attending: Interventional Cardiology | Admitting: Interventional Cardiology

## 2013-02-10 DIAGNOSIS — D235 Other benign neoplasm of skin of trunk: Secondary | ICD-10-CM | POA: Diagnosis not present

## 2013-02-10 DIAGNOSIS — D1801 Hemangioma of skin and subcutaneous tissue: Secondary | ICD-10-CM | POA: Diagnosis not present

## 2013-02-10 DIAGNOSIS — L570 Actinic keratosis: Secondary | ICD-10-CM | POA: Diagnosis not present

## 2013-02-11 ENCOUNTER — Encounter (HOSPITAL_COMMUNITY)
Admission: RE | Admit: 2013-02-11 | Discharge: 2013-02-11 | Disposition: A | Discharge status: Home / Self Care | Payer: Medicare Other | Source: Ambulatory Visit | Attending: Interventional Cardiology | Admitting: Interventional Cardiology

## 2013-02-11 ENCOUNTER — Encounter (HOSPITAL_COMMUNITY): Payer: Medicare Other

## 2013-02-14 ENCOUNTER — Encounter (HOSPITAL_COMMUNITY)
Admission: RE | Admit: 2013-02-14 | Discharge: 2013-02-14 | Disposition: A | Discharge status: Home / Self Care | Payer: Medicare Other | Source: Ambulatory Visit | Attending: Interventional Cardiology | Admitting: Interventional Cardiology

## 2013-02-14 ENCOUNTER — Encounter (HOSPITAL_COMMUNITY): Payer: Medicare Other

## 2013-02-15 DIAGNOSIS — J90 Pleural effusion, not elsewhere classified: Secondary | ICD-10-CM | POA: Diagnosis not present

## 2013-02-15 DIAGNOSIS — Z79899 Other long term (current) drug therapy: Secondary | ICD-10-CM | POA: Diagnosis not present

## 2013-02-15 DIAGNOSIS — I4891 Unspecified atrial fibrillation: Secondary | ICD-10-CM | POA: Diagnosis not present

## 2013-02-15 DIAGNOSIS — Z952 Presence of prosthetic heart valve: Secondary | ICD-10-CM | POA: Diagnosis not present

## 2013-02-15 DIAGNOSIS — I5031 Acute diastolic (congestive) heart failure: Secondary | ICD-10-CM | POA: Diagnosis not present

## 2013-02-16 ENCOUNTER — Other Ambulatory Visit: Payer: Self-pay | Admitting: *Deleted

## 2013-02-16 ENCOUNTER — Encounter (HOSPITAL_COMMUNITY): Payer: Medicare Other

## 2013-02-16 ENCOUNTER — Encounter (HOSPITAL_COMMUNITY)
Admission: RE | Admit: 2013-02-16 | Discharge: 2013-02-16 | Disposition: A | Discharge status: Home / Self Care | Payer: Medicare Other | Source: Ambulatory Visit | Attending: Interventional Cardiology | Admitting: Interventional Cardiology

## 2013-02-16 DIAGNOSIS — I2510 Atherosclerotic heart disease of native coronary artery without angina pectoris: Secondary | ICD-10-CM

## 2013-02-16 NOTE — Progress Notes (Signed)
[name] saw Dr [name] yesterday. [name] had some medication changes will obtain the office note.

## 2013-02-17 ENCOUNTER — Encounter: Payer: Self-pay | Admitting: Cardiothoracic Surgery

## 2013-02-17 ENCOUNTER — Ambulatory Visit (INDEPENDENT_AMBULATORY_CARE_PROVIDER_SITE_OTHER): Payer: Medicare Other | Admitting: Cardiothoracic Surgery

## 2013-02-17 VITALS — BP 105/49 | HR 46 | Resp 18 | Ht 67.5 in | Wt 166.0 lb

## 2013-02-17 DIAGNOSIS — Z954 Presence of other heart-valve replacement: Secondary | ICD-10-CM

## 2013-02-17 DIAGNOSIS — Z951 Presence of aortocoronary bypass graft: Secondary | ICD-10-CM | POA: Diagnosis not present

## 2013-02-17 DIAGNOSIS — J90 Pleural effusion, not elsewhere classified: Secondary | ICD-10-CM | POA: Diagnosis not present

## 2013-02-17 DIAGNOSIS — Z952 Presence of prosthetic heart valve: Secondary | ICD-10-CM

## 2013-02-17 NOTE — Progress Notes (Signed)
[address]       [place] [address]             [phone]                                  [name] [facility] Medical Record #[id number] Date of Birth: [birth year]  [name], MD Eric Raider, MD Dr [name]  Chief Complaint:   PostOp Follow Up Visit 09/17/2012   OPERATIVE REPORT  PREOPERATIVE DIAGNOSIS: Critical left main and critical aortic  stenosis.  POSTOPERATIVE DIAGNOSIS: Critical left main and critical aortic  stenosis.  SURGICAL PROCEDURE: Aortic valve replacement with a pericardial tissue  valve, [employer] 23 mm #[id number] and coronary artery bypass grafting x3  with the left internal mammary to the left anterior descending coronary  artery, reverse saphenous vein graft to the circumflex coronary artery,  and reverse saphenous vein graft to the distal right coronary artery  with right leg endo vein harvesting.   History of Present Illness:     Patient returns to the office in followup after recent aortic valve replacement and coronary artery bypass grafting for critical aortic stenosis and severe left main disease. The patient is now in cardiac rehabilitation and notes a marked improvement in his overall stamina and ability to ambulate. He notes that the swelling in his lower extremities continues to be much improved.  Now almost 6 months postoperatively the patient is returning to near-normal activities, his congestive heart failure symptoms have markedly improved. He is now doing yard work including cutting the grass mostly with a riding lawnmower.    History  Smoking status  . Never Smoker   Smokeless tobacco  . Never Used       No Known Allergies  Current Outpatient Prescriptions  Medication Sig Dispense Refill  . amiodarone (PACERONE) 200 MG tablet Take 100 mg by mouth daily.       Marland Kitchen aspirin 81 MG tablet Take 81 mg by mouth daily.      . famotidine (PEPCID) 20 MG tablet Take 20 mg by mouth  daily.      . furosemide (LASIX) 80 MG tablet Take 80 mg by mouth daily.       . potassium chloride SA (K-DUR,KLOR-CON) 20 MEQ tablet Take 20 mEq by mouth daily.      . simvastatin (ZOCOR) 20 MG tablet Take 10 mg by mouth every evening.       . traMADol (ULTRAM) 50 MG tablet Take 1 tablet (50 mg total) by mouth every 6 (six) hours as needed for pain.  30 tablet  0   No current facility-administered medications for this visit.       Physical Exam: BP 105/49  Pulse 46  Resp 18  Ht 5' 7.5" (1.715 m)  Wt 166 lb (75.297 kg)  BMI 25.6 kg/m2  SpO2 98%  General appearance: alert, cooperative, appears stated age and no distress Neurologic: intact Heart: regular rate and rhythm and friction rub heard left sternal upper left upper sternal border Lungs: Bilateral breath sounds are present  Abdomen: soft, non-tender; bowel sounds normal; no masses,  no organomegaly Extremities: Patient has mild edema in both lower ankles but much improved from previous exams . Very mild edema at the right ankle Wound: His sternum is stable and well healed    Diagnostic Studies & Laboratory data:  Recent Radiology Findings:  Recent chest xray at [facility] _ report small left effusion   Recent Labs: Lab Results  Component Value Date   WBC 10.0 09/22/2012   HGB 9.3* 09/22/2012   HCT 27.0* 09/22/2012   PLT 169 09/22/2012   GLUCOSE 109* 09/23/2012   ALT 12 09/16/2012   AST 22 09/16/2012   NA 136 09/23/2012   K 3.8 09/23/2012   CL 103 09/23/2012   CREATININE 1.24 09/23/2012   BUN 27* 09/23/2012   CO2 24 09/23/2012   INR 2.00* 09/17/2012   HGBA1C 6.3* 09/16/2012   No results found for this basename: TSH     Assessment / Plan:   Overall the patient has shown some improvement, with stable chest x-ray findings  Patient notes that cardiology has adjusted his Pacerone dose and check his thyroid function At this point the patient is making good progress but has taken almost 6 months form for  reaches usual state of activity. I've not made him a return appointment to see me but would be glad to see him at his or Dr. [name] request at any time.   Nevin Grizzle B 02/17/2013 1:01 PM

## 2013-02-18 ENCOUNTER — Encounter (HOSPITAL_COMMUNITY)
Admission: RE | Admit: 2013-02-18 | Discharge: 2013-02-18 | Disposition: A | Discharge status: Home / Self Care | Payer: Medicare Other | Source: Ambulatory Visit | Attending: Interventional Cardiology | Admitting: Interventional Cardiology

## 2013-02-18 ENCOUNTER — Encounter (HOSPITAL_COMMUNITY): Payer: Medicare Other

## 2013-02-18 NOTE — Progress Notes (Signed)
[name]'s weight was up slightly today. Dr [name] decreased his Furosemide to 80 mg once a day. No complaints of shortness of breath. Sa02 99% on room air. Will continue to monitor the patient throughout  the program.

## 2013-02-21 ENCOUNTER — Encounter (HOSPITAL_COMMUNITY): Payer: Medicare Other

## 2013-02-21 ENCOUNTER — Encounter (HOSPITAL_COMMUNITY)
Admission: RE | Admit: 2013-02-21 | Discharge: 2013-02-21 | Disposition: A | Discharge status: Home / Self Care | Payer: Medicare Other | Source: Ambulatory Visit | Attending: Interventional Cardiology | Admitting: Interventional Cardiology

## 2013-02-23 ENCOUNTER — Encounter (HOSPITAL_COMMUNITY): Payer: Medicare Other

## 2013-02-23 ENCOUNTER — Encounter (HOSPITAL_COMMUNITY)
Admission: RE | Admit: 2013-02-23 | Discharge: 2013-02-23 | Disposition: A | Discharge status: Home / Self Care | Payer: Medicare Other | Source: Ambulatory Visit | Attending: Interventional Cardiology | Admitting: Interventional Cardiology

## 2013-02-25 ENCOUNTER — Encounter (HOSPITAL_COMMUNITY)
Admission: RE | Admit: 2013-02-25 | Discharge: 2013-02-25 | Disposition: A | Discharge status: Home / Self Care | Payer: Medicare Other | Source: Ambulatory Visit | Attending: Interventional Cardiology | Admitting: Interventional Cardiology

## 2013-02-25 ENCOUNTER — Encounter (HOSPITAL_COMMUNITY): Payer: Medicare Other

## 2013-02-28 ENCOUNTER — Encounter (HOSPITAL_COMMUNITY): Payer: Medicare Other

## 2013-03-02 ENCOUNTER — Encounter (HOSPITAL_COMMUNITY): Payer: Medicare Other

## 2013-03-02 ENCOUNTER — Encounter (HOSPITAL_COMMUNITY)
Admission: RE | Admit: 2013-03-02 | Discharge: 2013-03-02 | Disposition: A | Discharge status: Home / Self Care | Payer: Medicare Other | Source: Ambulatory Visit | Attending: Interventional Cardiology | Admitting: Interventional Cardiology

## 2013-03-04 ENCOUNTER — Encounter (HOSPITAL_COMMUNITY): Payer: Medicare Other

## 2013-03-04 ENCOUNTER — Encounter (HOSPITAL_COMMUNITY)
Admission: RE | Admit: 2013-03-04 | Discharge: 2013-03-04 | Disposition: A | Discharge status: Home / Self Care | Payer: Medicare Other | Source: Ambulatory Visit | Attending: Interventional Cardiology | Admitting: Interventional Cardiology

## 2013-03-07 ENCOUNTER — Encounter (HOSPITAL_COMMUNITY)
Admission: RE | Admit: 2013-03-07 | Discharge: 2013-03-07 | Disposition: A | Discharge status: Home / Self Care | Payer: Medicare Other | Source: Ambulatory Visit | Attending: Interventional Cardiology | Admitting: Interventional Cardiology

## 2013-03-07 ENCOUNTER — Encounter (HOSPITAL_COMMUNITY): Payer: Medicare Other

## 2013-03-07 DIAGNOSIS — I359 Nonrheumatic aortic valve disorder, unspecified: Secondary | ICD-10-CM | POA: Diagnosis not present

## 2013-03-07 DIAGNOSIS — Z5189 Encounter for other specified aftercare: Secondary | ICD-10-CM | POA: Diagnosis not present

## 2013-03-07 DIAGNOSIS — I2510 Atherosclerotic heart disease of native coronary artery without angina pectoris: Secondary | ICD-10-CM | POA: Insufficient documentation

## 2013-03-09 ENCOUNTER — Encounter (HOSPITAL_COMMUNITY): Payer: Medicare Other

## 2013-03-09 ENCOUNTER — Encounter (HOSPITAL_COMMUNITY)
Admission: RE | Admit: 2013-03-09 | Discharge: 2013-03-09 | Disposition: A | Discharge status: Home / Self Care | Payer: Medicare Other | Source: Ambulatory Visit | Attending: Interventional Cardiology | Admitting: Interventional Cardiology

## 2013-03-09 DIAGNOSIS — I2510 Atherosclerotic heart disease of native coronary artery without angina pectoris: Secondary | ICD-10-CM | POA: Diagnosis not present

## 2013-03-09 DIAGNOSIS — Z5189 Encounter for other specified aftercare: Secondary | ICD-10-CM | POA: Diagnosis not present

## 2013-03-09 DIAGNOSIS — I359 Nonrheumatic aortic valve disorder, unspecified: Secondary | ICD-10-CM | POA: Diagnosis not present

## 2013-03-11 ENCOUNTER — Encounter (HOSPITAL_COMMUNITY)
Admission: RE | Admit: 2013-03-11 | Discharge: 2013-03-11 | Disposition: A | Discharge status: Home / Self Care | Payer: Medicare Other | Source: Ambulatory Visit | Attending: Interventional Cardiology | Admitting: Interventional Cardiology

## 2013-03-11 ENCOUNTER — Encounter (HOSPITAL_COMMUNITY): Payer: Medicare Other

## 2013-03-11 DIAGNOSIS — I359 Nonrheumatic aortic valve disorder, unspecified: Secondary | ICD-10-CM | POA: Diagnosis not present

## 2013-03-11 DIAGNOSIS — I2510 Atherosclerotic heart disease of native coronary artery without angina pectoris: Secondary | ICD-10-CM | POA: Diagnosis not present

## 2013-03-11 DIAGNOSIS — Z5189 Encounter for other specified aftercare: Secondary | ICD-10-CM | POA: Diagnosis not present

## 2013-03-11 NOTE — Progress Notes (Signed)
[employer] today. Deadrick plans to continue exercise at the [facility].

## 2013-03-14 ENCOUNTER — Encounter (HOSPITAL_COMMUNITY): Payer: Medicare Other

## 2013-03-16 ENCOUNTER — Encounter (HOSPITAL_COMMUNITY): Payer: Medicare Other

## 2013-03-18 ENCOUNTER — Encounter (HOSPITAL_COMMUNITY): Payer: Medicare Other

## 2013-03-31 DIAGNOSIS — I5031 Acute diastolic (congestive) heart failure: Secondary | ICD-10-CM | POA: Diagnosis not present

## 2013-03-31 DIAGNOSIS — I10 Essential (primary) hypertension: Secondary | ICD-10-CM | POA: Diagnosis not present

## 2013-03-31 DIAGNOSIS — I4891 Unspecified atrial fibrillation: Secondary | ICD-10-CM | POA: Diagnosis not present

## 2013-03-31 DIAGNOSIS — Z79899 Other long term (current) drug therapy: Secondary | ICD-10-CM | POA: Diagnosis not present

## 2013-03-31 DIAGNOSIS — Z952 Presence of prosthetic heart valve: Secondary | ICD-10-CM | POA: Diagnosis not present

## 2013-05-16 DIAGNOSIS — E039 Hypothyroidism, unspecified: Secondary | ICD-10-CM | POA: Diagnosis not present

## 2013-05-30 DIAGNOSIS — Z79899 Other long term (current) drug therapy: Secondary | ICD-10-CM | POA: Diagnosis not present

## 2013-05-30 DIAGNOSIS — E039 Hypothyroidism, unspecified: Secondary | ICD-10-CM | POA: Diagnosis not present

## 2013-05-30 DIAGNOSIS — Z952 Presence of prosthetic heart valve: Secondary | ICD-10-CM | POA: Diagnosis not present

## 2013-05-30 DIAGNOSIS — I5031 Acute diastolic (congestive) heart failure: Secondary | ICD-10-CM | POA: Diagnosis not present

## 2013-05-30 DIAGNOSIS — I10 Essential (primary) hypertension: Secondary | ICD-10-CM | POA: Diagnosis not present

## 2013-05-30 DIAGNOSIS — J90 Pleural effusion, not elsewhere classified: Secondary | ICD-10-CM | POA: Diagnosis not present

## 2013-05-30 DIAGNOSIS — Z7901 Long term (current) use of anticoagulants: Secondary | ICD-10-CM | POA: Diagnosis not present

## 2013-06-27 DIAGNOSIS — E039 Hypothyroidism, unspecified: Secondary | ICD-10-CM | POA: Diagnosis not present

## 2013-06-29 DIAGNOSIS — I5031 Acute diastolic (congestive) heart failure: Secondary | ICD-10-CM | POA: Diagnosis not present

## 2013-06-29 DIAGNOSIS — E119 Type 2 diabetes mellitus without complications: Secondary | ICD-10-CM | POA: Diagnosis not present

## 2013-06-29 DIAGNOSIS — E039 Hypothyroidism, unspecified: Secondary | ICD-10-CM | POA: Diagnosis not present

## 2013-06-29 DIAGNOSIS — L84 Corns and callosities: Secondary | ICD-10-CM | POA: Diagnosis not present

## 2013-06-29 DIAGNOSIS — E782 Mixed hyperlipidemia: Secondary | ICD-10-CM | POA: Diagnosis not present

## 2013-06-29 DIAGNOSIS — I110 Hypertensive heart disease with heart failure: Secondary | ICD-10-CM | POA: Diagnosis not present

## 2013-06-29 DIAGNOSIS — Z0000 Encounter for general adult medical examination without abnormal findings: Secondary | ICD-10-CM | POA: Diagnosis not present

## 2013-06-29 DIAGNOSIS — Z952 Presence of prosthetic heart valve: Secondary | ICD-10-CM | POA: Diagnosis not present

## 2013-07-01 ENCOUNTER — Other Ambulatory Visit: Payer: Self-pay | Admitting: Interventional Cardiology

## 2013-07-01 DIAGNOSIS — E039 Hypothyroidism, unspecified: Secondary | ICD-10-CM

## 2013-07-05 DIAGNOSIS — I4891 Unspecified atrial fibrillation: Secondary | ICD-10-CM | POA: Diagnosis not present

## 2013-07-05 DIAGNOSIS — I110 Hypertensive heart disease with heart failure: Secondary | ICD-10-CM | POA: Diagnosis not present

## 2013-07-05 DIAGNOSIS — Z952 Presence of prosthetic heart valve: Secondary | ICD-10-CM | POA: Diagnosis not present

## 2013-07-05 DIAGNOSIS — I5031 Acute diastolic (congestive) heart failure: Secondary | ICD-10-CM | POA: Diagnosis not present

## 2013-07-06 DIAGNOSIS — Z79899 Other long term (current) drug therapy: Secondary | ICD-10-CM | POA: Diagnosis not present

## 2013-07-25 ENCOUNTER — Other Ambulatory Visit: Payer: Self-pay | Admitting: *Deleted

## 2013-07-25 DIAGNOSIS — Z79899 Other long term (current) drug therapy: Secondary | ICD-10-CM

## 2013-07-27 DIAGNOSIS — Z23 Encounter for immunization: Secondary | ICD-10-CM | POA: Diagnosis not present

## 2013-08-18 ENCOUNTER — Other Ambulatory Visit (INDEPENDENT_AMBULATORY_CARE_PROVIDER_SITE_OTHER): Payer: Medicare Other

## 2013-08-18 DIAGNOSIS — Z79899 Other long term (current) drug therapy: Secondary | ICD-10-CM | POA: Diagnosis not present

## 2013-08-18 DIAGNOSIS — E039 Hypothyroidism, unspecified: Secondary | ICD-10-CM | POA: Diagnosis not present

## 2013-08-18 LAB — HEPATIC FUNCTION PANEL
Albumin: 3.9 g/dL (ref 3.5–5.2)
Total Bilirubin: 0.8 mg/dL (ref 0.3–1.2)

## 2013-08-18 LAB — TSH: TSH: 5.21 u[IU]/mL (ref 0.35–5.50)

## 2013-09-26 ENCOUNTER — Other Ambulatory Visit: Payer: Self-pay | Admitting: Interventional Cardiology

## 2013-12-26 ENCOUNTER — Other Ambulatory Visit: Payer: Self-pay | Admitting: *Deleted

## 2013-12-26 MED ORDER — LEVOTHYROXINE SODIUM 50 MCG PO TABS: 50.0000 ug | ORAL_TABLET | Freq: Every day | ORAL | Status: DC

## 2013-12-27 ENCOUNTER — Other Ambulatory Visit: Payer: Self-pay

## 2013-12-29 ENCOUNTER — Encounter: Payer: Self-pay | Admitting: Interventional Cardiology

## 2013-12-29 DIAGNOSIS — E782 Mixed hyperlipidemia: Secondary | ICD-10-CM | POA: Diagnosis not present

## 2013-12-29 DIAGNOSIS — Z952 Presence of prosthetic heart valve: Secondary | ICD-10-CM | POA: Diagnosis not present

## 2013-12-29 DIAGNOSIS — I5031 Acute diastolic (congestive) heart failure: Secondary | ICD-10-CM | POA: Diagnosis not present

## 2013-12-29 DIAGNOSIS — E039 Hypothyroidism, unspecified: Secondary | ICD-10-CM | POA: Diagnosis not present

## 2013-12-29 DIAGNOSIS — L84 Corns and callosities: Secondary | ICD-10-CM | POA: Diagnosis not present

## 2013-12-29 DIAGNOSIS — Z23 Encounter for immunization: Secondary | ICD-10-CM | POA: Diagnosis not present

## 2013-12-29 DIAGNOSIS — I509 Heart failure, unspecified: Secondary | ICD-10-CM | POA: Diagnosis not present

## 2013-12-29 DIAGNOSIS — E119 Type 2 diabetes mellitus without complications: Secondary | ICD-10-CM | POA: Diagnosis not present

## 2013-12-29 DIAGNOSIS — I110 Hypertensive heart disease with heart failure: Secondary | ICD-10-CM | POA: Diagnosis not present

## 2014-01-11 ENCOUNTER — Encounter: Payer: Self-pay | Admitting: Interventional Cardiology

## 2014-01-16 ENCOUNTER — Ambulatory Visit (INDEPENDENT_AMBULATORY_CARE_PROVIDER_SITE_OTHER): Payer: Medicare Other | Admitting: Interventional Cardiology

## 2014-01-16 ENCOUNTER — Encounter: Payer: Self-pay | Admitting: Interventional Cardiology

## 2014-01-16 VITALS — BP 150/60 | HR 84 | Ht 69.0 in | Wt 169.8 lb

## 2014-01-16 DIAGNOSIS — Z951 Presence of aortocoronary bypass graft: Secondary | ICD-10-CM | POA: Diagnosis not present

## 2014-01-16 DIAGNOSIS — Z952 Presence of prosthetic heart valve: Secondary | ICD-10-CM

## 2014-01-16 DIAGNOSIS — I4891 Unspecified atrial fibrillation: Secondary | ICD-10-CM

## 2014-01-16 DIAGNOSIS — I10 Essential (primary) hypertension: Secondary | ICD-10-CM | POA: Diagnosis not present

## 2014-01-16 DIAGNOSIS — Z954 Presence of other heart-valve replacement: Secondary | ICD-10-CM | POA: Diagnosis not present

## 2014-01-16 NOTE — Patient Instructions (Signed)
Your physician recommends that you continue on your current medications as directed. Please refer to the Current Medication list given to you today.  Your physician wants you to follow-up in: 1 year with Dr.Smith You will receive a reminder letter in the mail two months in advance. If you don't receive a letter, please call our office to schedule the follow-up appointment.  

## 2014-01-16 NOTE — Progress Notes (Signed)
Patient ID: [name], male   DOB: May 25, 1924, 80 y.o.   MRN: [id number]    [address], [address] [place], [place]  [address] Phone: [phone] Fax:  [phone]  Date:  01/16/2014   ID:  [name], DOB 1923/12/26, MRN [id number]  PCP:  Mayra Neer, MD   ASSESSMENT:  1. Aortic valve replacement with bioprosthesis, December 2013, normal auscultation and clinical pattern 2. Coronary atherosclerosis with prior three-vessel coronary bypass grafting, also December 2013, asymptomatic 3. Hypertension with borderline control 4. No evidence of recurrent atrial fibrillation  PLAN:  1. Continue current medical regimen. 2. Notify if palpitations, lightheadedness, syncope, or other cardiac complaints 3. Otherwise return to see me in one year   SUBJECTIVE: [name] is a 80 y.o. male who had aortic valve and coronary artery bypass surgery in December 2013. Initially, the surgical course was complicated by atrial arrhythmias and diastolic heart failure. He is subsequently done well without any recurrence of atrial arrhythmias or heart failure on minimal medical therapy at the current time. He specifically denies angina, orthopnea, PND, lower extremity edema, syncope, and neurological complaints.   Wt Readings from Last 3 Encounters:  01/16/14 169 lb 12.8 oz (77.021 kg)  02/17/13 166 lb (75.297 kg)  01/06/13 166 lb (75.297 kg)     Past Medical History  Diagnosis Date  . Borderline diabetes   . Hypertension   . Hypercholesteremia   . Heart murmur   . GI bleed   . Ulcer     PEPTIC ULCER DZ...DR. Watt Climes  . BPH (benign prostatic hypertrophy)   . Arthritis     OSTEO OF KNEE  . Aortic stenosis 05/27/12    Severe to critical  . Coronary artery disease     Current Outpatient Prescriptions  Medication Sig Dispense Refill  . aspirin 81 MG tablet Take 81 mg by mouth daily.      . famotidine (PEPCID) 20 MG tablet Take 20 mg by mouth daily.      Marland Kitchen levothyroxine  (SYNTHROID, LEVOTHROID) 50 MCG tablet Take 1 tablet (50 mcg total) by mouth daily before breakfast.  90 tablet  0  . lisinopril (PRINIVIL,ZESTRIL) 20 MG tablet Take 20 mg by mouth daily.      . simvastatin (ZOCOR) 20 MG tablet Take 10 mg by mouth every evening.        No current facility-administered medications for this visit.    Allergies:   No Known Allergies  Social History:  The patient  reports that he has never smoked. He has never used smokeless tobacco. He reports that he does not drink alcohol or use illicit drugs.   ROS:  Please see the history of present illness.   Denies chills, fever, anorexia, and gastrointestinal problems   All other systems reviewed and negative.   OBJECTIVE: VS:  BP 150/60  Pulse 84  Ht 5\' 9"  (1.753 m)  Wt 169 lb 12.8 oz (77.021 kg)  BMI 25.06 kg/m2 Well nourished, well developed, in no acute distress, elderly but healthy appearing HEENT: normal Neck: JVD flat while lying at 45. Carotid bruit 2+ upstroke  Cardiac:  normal S1, S2; RRR; 7-6/1 systolic murmur Lungs:  clear to auscultation bilaterally, no wheezing, rhonchi or rales Abd: soft, nontender, no hepatomegaly Ext: Edema trace bilateral. Pulses 2+ bilateral Skin: warm and dry Neuro:  CNs 2-12 intact, no focal abnormalities noted  EKG:  Normal sinus rhythm with septal Q waves, and inferior T-wave abnormality.  [place] axis  also noted to       Signed, Loraine, MD 01/16/2014 8:41 AM

## 2014-01-19 DIAGNOSIS — Z961 Presence of intraocular lens: Secondary | ICD-10-CM | POA: Diagnosis not present

## 2014-03-15 ENCOUNTER — Other Ambulatory Visit: Payer: Self-pay

## 2014-03-15 MED ORDER — LEVOTHYROXINE SODIUM 50 MCG PO TABS: 50.0000 ug | ORAL_TABLET | Freq: Every day | ORAL | Status: DC

## 2014-03-23 ENCOUNTER — Encounter: Payer: Self-pay | Admitting: *Deleted

## 2014-04-20 DIAGNOSIS — R42 Dizziness and giddiness: Secondary | ICD-10-CM | POA: Diagnosis not present

## 2014-05-09 DIAGNOSIS — H903 Sensorineural hearing loss, bilateral: Secondary | ICD-10-CM | POA: Diagnosis not present

## 2014-05-09 DIAGNOSIS — R42 Dizziness and giddiness: Secondary | ICD-10-CM | POA: Diagnosis not present

## 2014-05-09 DIAGNOSIS — J310 Chronic rhinitis: Secondary | ICD-10-CM | POA: Diagnosis not present

## 2014-05-09 DIAGNOSIS — H8190 Unspecified disorder of vestibular function, unspecified ear: Secondary | ICD-10-CM | POA: Diagnosis not present

## 2014-05-09 DIAGNOSIS — H905 Unspecified sensorineural hearing loss: Secondary | ICD-10-CM | POA: Diagnosis not present

## 2014-05-09 DIAGNOSIS — H6980 Other specified disorders of Eustachian tube, unspecified ear: Secondary | ICD-10-CM | POA: Diagnosis not present

## 2014-07-07 DIAGNOSIS — E210 Primary hyperparathyroidism: Secondary | ICD-10-CM | POA: Diagnosis not present

## 2014-07-07 DIAGNOSIS — I5030 Unspecified diastolic (congestive) heart failure: Secondary | ICD-10-CM | POA: Diagnosis not present

## 2014-07-07 DIAGNOSIS — L84 Corns and callosities: Secondary | ICD-10-CM | POA: Diagnosis not present

## 2014-07-07 DIAGNOSIS — E782 Mixed hyperlipidemia: Secondary | ICD-10-CM | POA: Diagnosis not present

## 2014-07-07 DIAGNOSIS — Z0000 Encounter for general adult medical examination without abnormal findings: Secondary | ICD-10-CM | POA: Diagnosis not present

## 2014-07-07 DIAGNOSIS — Z1389 Encounter for screening for other disorder: Secondary | ICD-10-CM | POA: Diagnosis not present

## 2014-07-07 DIAGNOSIS — M1990 Unspecified osteoarthritis, unspecified site: Secondary | ICD-10-CM | POA: Diagnosis not present

## 2014-07-07 DIAGNOSIS — E119 Type 2 diabetes mellitus without complications: Secondary | ICD-10-CM | POA: Diagnosis not present

## 2014-07-07 DIAGNOSIS — I110 Hypertensive heart disease with heart failure: Secondary | ICD-10-CM | POA: Diagnosis not present

## 2014-07-07 DIAGNOSIS — Z23 Encounter for immunization: Secondary | ICD-10-CM | POA: Diagnosis not present

## 2014-07-10 ENCOUNTER — Encounter: Payer: Self-pay | Admitting: Interventional Cardiology

## 2014-09-12 ENCOUNTER — Other Ambulatory Visit: Payer: Self-pay | Admitting: Family Medicine

## 2014-09-12 DIAGNOSIS — R1310 Dysphagia, unspecified: Secondary | ICD-10-CM

## 2014-09-13 ENCOUNTER — Ambulatory Visit
Admission: RE | Admit: 2014-09-13 | Discharge: 2014-09-13 | Disposition: A | Discharge status: Home / Self Care | Payer: Medicare Other | Source: Ambulatory Visit | Attending: Family Medicine | Admitting: Family Medicine

## 2014-09-13 DIAGNOSIS — K219 Gastro-esophageal reflux disease without esophagitis: Secondary | ICD-10-CM | POA: Diagnosis not present

## 2014-09-13 DIAGNOSIS — K449 Diaphragmatic hernia without obstruction or gangrene: Secondary | ICD-10-CM | POA: Diagnosis not present

## 2014-09-13 DIAGNOSIS — R1310 Dysphagia, unspecified: Secondary | ICD-10-CM

## 2014-09-22 DIAGNOSIS — R1310 Dysphagia, unspecified: Secondary | ICD-10-CM | POA: Diagnosis not present

## 2014-09-22 DIAGNOSIS — K219 Gastro-esophageal reflux disease without esophagitis: Secondary | ICD-10-CM | POA: Diagnosis not present

## 2015-01-08 DIAGNOSIS — I110 Hypertensive heart disease with heart failure: Secondary | ICD-10-CM | POA: Diagnosis not present

## 2015-01-08 DIAGNOSIS — I482 Chronic atrial fibrillation: Secondary | ICD-10-CM | POA: Diagnosis not present

## 2015-01-08 DIAGNOSIS — Z952 Presence of prosthetic heart valve: Secondary | ICD-10-CM | POA: Diagnosis not present

## 2015-01-08 DIAGNOSIS — E119 Type 2 diabetes mellitus without complications: Secondary | ICD-10-CM | POA: Diagnosis not present

## 2015-01-08 DIAGNOSIS — L84 Corns and callosities: Secondary | ICD-10-CM | POA: Diagnosis not present

## 2015-01-08 DIAGNOSIS — E039 Hypothyroidism, unspecified: Secondary | ICD-10-CM | POA: Diagnosis not present

## 2015-01-08 DIAGNOSIS — E782 Mixed hyperlipidemia: Secondary | ICD-10-CM | POA: Diagnosis not present

## 2015-01-08 DIAGNOSIS — I5030 Unspecified diastolic (congestive) heart failure: Secondary | ICD-10-CM | POA: Diagnosis not present

## 2015-01-09 ENCOUNTER — Encounter: Payer: Self-pay | Admitting: Interventional Cardiology

## 2015-01-16 ENCOUNTER — Encounter: Payer: Self-pay | Admitting: Interventional Cardiology

## 2015-01-16 ENCOUNTER — Ambulatory Visit: Payer: Medicare Other | Admitting: Interventional Cardiology

## 2015-01-16 ENCOUNTER — Ambulatory Visit (INDEPENDENT_AMBULATORY_CARE_PROVIDER_SITE_OTHER): Payer: Medicare Other | Admitting: Interventional Cardiology

## 2015-01-16 VITALS — BP 136/64 | HR 88 | Ht 67.0 in | Wt 173.8 lb

## 2015-01-16 DIAGNOSIS — Z951 Presence of aortocoronary bypass graft: Secondary | ICD-10-CM

## 2015-01-16 DIAGNOSIS — Z954 Presence of other heart-valve replacement: Secondary | ICD-10-CM | POA: Diagnosis not present

## 2015-01-16 DIAGNOSIS — I4892 Unspecified atrial flutter: Secondary | ICD-10-CM | POA: Diagnosis not present

## 2015-01-16 DIAGNOSIS — I10 Essential (primary) hypertension: Secondary | ICD-10-CM

## 2015-01-16 DIAGNOSIS — Z953 Presence of xenogenic heart valve: Secondary | ICD-10-CM

## 2015-01-16 DIAGNOSIS — I480 Paroxysmal atrial fibrillation: Secondary | ICD-10-CM

## 2015-01-16 NOTE — Progress Notes (Addendum)
Cardiology Office Note   Date:  01/16/2015   ID:  [name], DOB [birth year], MRN [id number]  PCP:  Mayra Neer, MD  Cardiologist:   Sinclair Grooms, MD   No chief complaint on file.     History of Present Illness: [name] is a 81 y.o. male who presents for coronary artery disease with CABG and bioprosthetic aortic valve performed December 2013. He has angina but for the past 6-9 months after a "inner ear" he has lost energy, notices decreased balance, and is not as functional as he previously was. He denies dyspnea. No chest pain. He has not had lower extremity swelling. The major complaint is fatigue area    Past Medical History  Diagnosis Date  . Borderline diabetes   . Hypertension   . Hypercholesteremia   . Heart murmur   . GI bleed   . Ulcer     PEPTIC ULCER DZ...DR. Watt Climes  . BPH (benign prostatic hypertrophy)   . Arthritis     OSTEO OF KNEE  . Aortic stenosis 05/27/12    Severe to critical  . Coronary artery disease     Past Surgical History  Procedure Laterality Date  . Appendectomy    . Coronary artery bypass graft  09-17-2012    CABG x 3/ AVR  . Cardiac valve replacement  09-17-2012  . Coronary artery bypass graft  09/17/2012    Procedure: CORONARY ARTERY BYPASS GRAFTING (CABG);  Surgeon: Grace Isaac, MD;  Location: [facility];  Service: Open Heart Surgery;  Laterality: N/A;  CABG x three,  using left internal mammary artery and right leg greater saphenous vein harvested endoscopically  . Intraoperative transesophageal echocardiogram  09/17/2012    Procedure: INTRAOPERATIVE TRANSESOPHAGEAL ECHOCARDIOGRAM;  Surgeon: Grace Isaac, MD;  Location: [facility];  Service: Open Heart Surgery;  Laterality: N/A;  . Aortic valve replacement  09/17/2012    Procedure: AORTIC VALVE REPLACEMENT (AVR);  Surgeon: Grace Isaac, MD;  Location: [facility];  Service: Open Heart Surgery;  Laterality: N/A;     Current Outpatient Prescriptions  Medication  Sig Dispense Refill  . aspirin 81 MG tablet Take 81 mg by mouth daily.    . famotidine (PEPCID) 20 MG tablet Take 20 mg by mouth daily.    Marland Kitchen levothyroxine (SYNTHROID, LEVOTHROID) 50 MCG tablet Take 1 tablet (50 mcg total) by mouth daily before breakfast. 90 tablet 3  . lisinopril (PRINIVIL,ZESTRIL) 20 MG tablet Take 20 mg by mouth daily.    . simvastatin (ZOCOR) 10 MG tablet Take 10 mg by mouth daily.     No current facility-administered medications for this visit.    Allergies:   Review of patient's allergies indicates no known allergies.    Social History:  The patient  reports that he has never smoked. He has never used smokeless tobacco. He reports that he does not drink alcohol or use illicit drugs.   Family History:  The patient's family history includes Hypertension in his father.    ROS:  Please see the history of present illness.   Otherwise, review of systems are positive for in her ear infection but has difficulty describing complaint.   There are no transient neurological symptoms. He has not had syncope or falls. No history of gastrointestinal bleeding. All other systems are reviewed and negative.    PHYSICAL EXAM: VS:  BP 136/64 mmHg  Pulse 88  Ht 5\' 7"  (1.702 m)  Wt 173 lb 12.8 oz (78.835 kg)  BMI 27.21 kg/m2 , BMI Body mass index is 27.21 kg/(m^2). GEN: Well nourished, well developed, in no acute distress HEENT: normal Neck: no JVD, carotid bruits, or masses Cardiac: IRR; 1/6 decrescendo murmur of aortic regurgitation. There are no  rubs, or gallops,no edema  Respiratory:  clear to auscultation bilaterally, normal work of breathing GI: soft, nontender, nondistended, + BS MS: no deformity or atrophy Skin: warm and dry, no rash Neuro:  Strength and sensation are intact Psych: euthymic mood, full affect   EKG:  EKG is ordered today. The ekg ordered today demonstrates atrial flutter with 3-1 AV conduction.   Recent Labs: No results found for requested labs  within last 365 days.    Lipid Panel No results found for: CHOL, TRIG, HDL, CHOLHDL, VLDL, LDLCALC, LDLDIRECT    Wt Readings from Last 3 Encounters:  01/16/15 173 lb 12.8 oz (78.835 kg)  01/16/14 169 lb 12.8 oz (77.021 kg)  02/17/13 166 lb (75.297 kg)      Other studies Reviewed: Additional studies/ records that were reviewed today include: Surgical notes and prior office visits.. Review of the above records demonstrates: Received amiodarone at discharge following valve/CABG because of paroxysmal atrial fibrillation. Amiodarone was eventually discontinued 3-6 months after surgery.   ASSESSMENT AND PLAN:  S/P aortic valve replacement with bioprosthetic valve - there is mild aortic regurgitation  S/P CABG (coronary artery bypass graft) - there are no symptoms of angina  Essential hypertension - good control  Atrial flutter, unspecified - unknown duration and with 3:1 AV conduction present. 24 hour Holter will be done to determine heart rate and rhythm over a longer time frame. This will also help [place] to determine what next steps need to be made. He will likely need anticoagulation. We have avoided this in the past because of age and frailty. I meter more concerned now because of his issues with balance and the risk of falling. We will then also determine when he returns whether cardioversion would be a management strategy.. For now I will simply continue aspirin but consider switching to anticoagulation after the rhythm is confirmed on monitoring.    Current medicines are reviewed at length with the patient today.  The patient does not have concerns regarding medicines.  The following changes have been made:  no change  Labs/ tests ordered today include:   Orders Placed This Encounter  Procedures  . EKG 12-Lead  . Holter monitor - 24 hour     Disposition:   FU with Linard Millers  in 1 month   Signed, Sinclair Grooms, MD  01/16/2015 12:06 PM    [address], [place], [place]  [address] Phone: [phone]; Fax: [phone]

## 2015-01-16 NOTE — Patient Instructions (Signed)
Your physician recommends that you continue on your current medications as directed. Please refer to the Current Medication list given to you today.  Your physician has recommended that you wear a holter monitor. Holter monitors are medical devices that record the heart's electrical activity. Doctors most often use these monitors to diagnose arrhythmias. Arrhythmias are problems with the speed or rhythm of the heartbeat. The monitor is a small, portable device. You can wear one while you do your normal daily activities. This is usually used to diagnose what is causing palpitations/syncope (passing out).  Your physician recommends that you schedule a follow-up appointment in: 1 month

## 2015-01-17 ENCOUNTER — Encounter (INDEPENDENT_AMBULATORY_CARE_PROVIDER_SITE_OTHER): Payer: Medicare Other

## 2015-01-17 ENCOUNTER — Encounter: Payer: Self-pay | Admitting: *Deleted

## 2015-01-17 DIAGNOSIS — I4891 Unspecified atrial fibrillation: Secondary | ICD-10-CM | POA: Diagnosis not present

## 2015-01-17 DIAGNOSIS — I4892 Unspecified atrial flutter: Secondary | ICD-10-CM

## 2015-01-17 NOTE — Progress Notes (Signed)
Patient ID: [name], male   DOB: 03-Aug-1924, 81 y.o.   MRN: [id number] [facility] 24 hour holter monitor applied to patient.

## 2015-01-22 DIAGNOSIS — Z961 Presence of intraocular lens: Secondary | ICD-10-CM | POA: Diagnosis not present

## 2015-01-30 ENCOUNTER — Telehealth: Payer: Self-pay

## 2015-01-30 DIAGNOSIS — I482 Chronic atrial fibrillation, unspecified: Secondary | ICD-10-CM

## 2015-01-30 NOTE — Telephone Encounter (Signed)
Lmtcb. Called to give pt holter monitor result and Dr.Smith's recommendations.  Pt will need to start anti-coag (Eliquis)  Per Kristin,pharm-d Pt will need to have a bmet before dosage recommendation can be made

## 2015-01-30 NOTE — Telephone Encounter (Signed)
Pt aware of holter monitor results and Dr.Smith's recommendations -Continuous Afib/Aflutter -START Eliquis per pharmacy recommendation -F/U with Dr.Smith after to discuss Pt verbalized understanding.

## 2015-01-31 ENCOUNTER — Other Ambulatory Visit (INDEPENDENT_AMBULATORY_CARE_PROVIDER_SITE_OTHER): Payer: Medicare Other | Admitting: *Deleted

## 2015-01-31 DIAGNOSIS — I482 Chronic atrial fibrillation, unspecified: Secondary | ICD-10-CM

## 2015-01-31 LAB — BASIC METABOLIC PANEL
BUN: 23 mg/dL (ref 6–23)
CO2: 26 meq/L (ref 19–32)
Calcium: 10.3 mg/dL (ref 8.4–10.5)
Chloride: 104 mEq/L (ref 96–112)
Creatinine, Ser: 1.66 mg/dL — ABNORMAL HIGH (ref 0.40–1.50)
GFR: 41.67 mL/min — ABNORMAL LOW (ref >60.00)
Glucose, Bld: 101 mg/dL — ABNORMAL HIGH (ref 70–99)
POTASSIUM: 3.8 meq/L (ref 3.5–5.1)
SODIUM: 136 meq/L (ref 135–145)

## 2015-02-02 ENCOUNTER — Telehealth: Payer: Self-pay

## 2015-02-02 MED ORDER — APIXABAN 2.5 MG PO TABS
2.5000 mg | ORAL_TABLET | Freq: Two times a day (BID) | ORAL | Status: DC
Start: 2015-02-02 — End: 2015-02-02

## 2015-02-02 MED ORDER — APIXABAN 2.5 MG PO TABS: 2.5000 mg | ORAL_TABLET | Freq: Two times a day (BID) | ORAL | Status: DC

## 2015-02-02 NOTE — Telephone Encounter (Signed)
Pt aware of Dr.Smith/Sally P, Pharm-D recommendations. Pt should start Eliquis 2.5mg  bid. Rx sent to pt local and mail order pharmacy. Pt is to keep appt with Dr.Smith on 617 Pt verbalized understanding.

## 2015-02-05 ENCOUNTER — Telehealth: Payer: Self-pay | Admitting: Interventional Cardiology

## 2015-02-05 NOTE — Telephone Encounter (Signed)
Pt c/o medication issue:  1. Name of Medication: Eliquis  2. How are you currently taking this medication (dosage and times per day)? Haven't taken it as of yet  3. Are you having a reaction (difficulty breathing--STAT)? No   4. What is your medication issue? Pt wife called in states that an Eliquis RX that was called in. Has a few questions  1. Cant afford the medication. Is there an alternative 2. Concerned about the history of the GI blockage. Will Eliquis interfere with anything concerning the blockage.  3. Taking a low dose of Asprin, not sure if the pt should continue taking it or not. Please call back to discuss.

## 2015-02-06 NOTE — Telephone Encounter (Signed)
Returned pt call. Pt gave permission for me to speak with his wife. Pt has not started Eliquis. They will not be able to afford the medication. It would cost them $350 a month. Adv pt wife the most cost effective option would probably be Coumadin. They are also concerned about the increase risk of bleeding, since the pt has a hx of GI bleeding. Pt is currently taking his asa 81mg  daily. Adv Mrs.Whitt I will fwd Dr.Smith an update and callback with his recommendation. Mrs.Reynolds was agreeable and verbalized understanding.

## 2015-02-06 NOTE — Telephone Encounter (Signed)
Pt and pt wife aware of Dr.Smith's response If we are going to try to get the heart back and rhythm, he will need to be on therapeutic anticoagulation for at least 3 weeks prior to electrical cardioversion. If we decide to leave him out of rhythm and except his current symptoms, this will not be necessary. He will have a high risk of stroke without anticoagulation. I understand the concern about the possibility of bleeding.  Pt will begin Eliquis. Samples left at the front desk for pick up. Adv pt wife I will call back to move up apt appt to earlier June. Pt wife verbalized understanding.

## 2015-02-06 NOTE — Telephone Encounter (Signed)
If we are going to try to get the heart back and rhythm, he will need to be on therapeutic anticoagulation for at least 3 weeks prior to electrical cardioversion. If we decide to leave him out of rhythm and except his current symptoms, this will not be necessary. He will have a high risk of stroke without anticoagulation. I understand the concern about the possibility of bleeding.

## 2015-02-08 ENCOUNTER — Telehealth: Payer: Self-pay

## 2015-02-08 NOTE — Telephone Encounter (Signed)
-----   Message from Belva Crome, MD sent at 02/03/2015  1:25 AM EDT ----- okay

## 2015-02-20 NOTE — Telephone Encounter (Signed)
Attempted to call pt to move up pt appt with Dr.Smith. Unable to lmom pt phone rings out

## 2015-03-07 ENCOUNTER — Other Ambulatory Visit: Payer: Self-pay | Admitting: Interventional Cardiology

## 2015-03-23 ENCOUNTER — Ambulatory Visit (INDEPENDENT_AMBULATORY_CARE_PROVIDER_SITE_OTHER): Payer: Medicare Other | Admitting: Interventional Cardiology

## 2015-03-23 ENCOUNTER — Encounter: Payer: Self-pay | Admitting: Interventional Cardiology

## 2015-03-23 VITALS — BP 120/58 | HR 84 | Ht 67.0 in | Wt 169.1 lb

## 2015-03-23 DIAGNOSIS — Z954 Presence of other heart-valve replacement: Secondary | ICD-10-CM

## 2015-03-23 DIAGNOSIS — I480 Paroxysmal atrial fibrillation: Secondary | ICD-10-CM | POA: Diagnosis not present

## 2015-03-23 DIAGNOSIS — I10 Essential (primary) hypertension: Secondary | ICD-10-CM | POA: Diagnosis not present

## 2015-03-23 DIAGNOSIS — Z953 Presence of xenogenic heart valve: Secondary | ICD-10-CM

## 2015-03-23 DIAGNOSIS — Z951 Presence of aortocoronary bypass graft: Secondary | ICD-10-CM

## 2015-03-23 NOTE — Progress Notes (Signed)
Cardiology Office Note   Date:  03/23/2015   ID:  [name], DOB 26-Jul-1924, MRN [id number]  PCP:  Mayra Neer, MD  Cardiologist:  Sinclair Grooms, MD   Chief Complaint  Patient presents with  . Cardiac Valve Problem      History of Present Illness: [name] is a 81 y.o. male who presents for bioprosthetic aortic valve, coronary artery disease with prior bypass surgery, atrial fibrillation, history of peptic ulcer disease with bleeding, and hypertension.  Patient denies orthopnea, PND, palpitations. There been no transient neurological complaints. Appetite is been stable. He is on Eliquis  2.5 mg twice a day and has had no complications. He denies syncope.      Past Medical History  Diagnosis Date  . Borderline diabetes   . Hypertension   . Hypercholesteremia   . Heart murmur   . GI bleed   . Ulcer     PEPTIC ULCER DZ...DR. Watt Climes  . BPH (benign prostatic hypertrophy)   . Arthritis     OSTEO OF KNEE  . Aortic stenosis 05/27/12    Severe to critical  . Coronary artery disease     Past Surgical History  Procedure Laterality Date  . Appendectomy    . Coronary artery bypass graft  09-17-2012    CABG x 3/ AVR  . Cardiac valve replacement  09-17-2012  . Coronary artery bypass graft  09/17/2012    Procedure: CORONARY ARTERY BYPASS GRAFTING (CABG);  Surgeon: Grace Isaac, MD;  Location: [facility];  Service: Open Heart Surgery;  Laterality: N/A;  CABG x three,  using left internal mammary artery and right leg greater saphenous vein harvested endoscopically  . Intraoperative transesophageal echocardiogram  09/17/2012    Procedure: INTRAOPERATIVE TRANSESOPHAGEAL ECHOCARDIOGRAM;  Surgeon: Grace Isaac, MD;  Location: [facility];  Service: Open Heart Surgery;  Laterality: N/A;  . Aortic valve replacement  09/17/2012    Procedure: AORTIC VALVE REPLACEMENT (AVR);  Surgeon: Grace Isaac, MD;  Location: [facility];  Service: Open Heart Surgery;  Laterality:  N/A;     Current Outpatient Prescriptions  Medication Sig Dispense Refill  . apixaban (ELIQUIS) 2.5 MG TABS tablet Take 1 tablet (2.5 mg total) by mouth 2 (two) times daily. 180 tablet 1  . aspirin 81 MG tablet Take 81 mg by mouth daily.    . famotidine (PEPCID) 20 MG tablet Take 20 mg by mouth daily.    Marland Kitchen levothyroxine (SYNTHROID, LEVOTHROID) 50 MCG tablet TAKE 1 TABLET DAILY BEFORE BREAKFAST ON AN EMPTY STOMACH (KEEP APPOINTMENT IN APRIL 2015 FOR FURTHER REFILLS) 90 tablet 0  . lisinopril (PRINIVIL,ZESTRIL) 10 MG tablet Take 1 tablet by mouth daily.    . simvastatin (ZOCOR) 10 MG tablet Take 10 mg by mouth daily.     No current facility-administered medications for this visit.    Allergies:   Review of patient's allergies indicates no known allergies.    Social History:  The patient  reports that he has never smoked. He has never used smokeless tobacco. He reports that he does not drink alcohol or use illicit drugs.   Family History:  The patient's family history includes Dementia in his mother; Hypertension in his father.    ROS:  Please see the history of present illness.   Otherwise, review of systems are positive for since he developed vertigo greater than a year and a half ago he has had chronic lightheaded feeling. No true vertigo. He has not had syncope.  Symptoms is somewhat positional..   All other systems are reviewed and negative.    PHYSICAL EXAM: VS:  BP 120/58 mmHg  Pulse 84  Ht 5\' 7"  (1.702 m)  Wt 76.712 kg (169 lb 1.9 oz)  BMI 26.48 kg/m2  SpO2 98% , BMI Body mass index is 26.48 kg/(m^2). GEN: Well nourished, well developed, in no acute distress HEENT: normal Neck: no JVD, carotid bruits, or masses Cardiac: RRR; no murmurs, rubs, or gallops,no edema  Respiratory:  clear to auscultation bilaterally, normal work of breathing GI: soft, nontender, nondistended, + BS MS: no deformity or atrophy Skin: warm and dry, no rash Neuro:  Strength and sensation are  intact Psych: euthymic mood, full affect   EKG:  EKG is not ordered today.    Recent Labs: 01/31/2015: BUN 23; Creatinine, Ser 1.66*; Potassium 3.8; Sodium 136    Lipid Panel No results found for: CHOL, TRIG, HDL, CHOLHDL, VLDL, LDLCALC, LDLDIRECT    Wt Readings from Last 3 Encounters:  03/23/15 76.712 kg (169 lb 1.9 oz)  01/16/15 78.835 kg (173 lb 12.8 oz)  01/16/14 77.021 kg (169 lb 12.8 oz)      Other studies Reviewed: Additional studies/ records that were reviewed today include: Reviewed 24 Hour Holter.    ASSESSMENT AND PLAN:  S/P CABG (coronary artery bypass graft) - asymptomatic  S/P aortic valve replacement with bioprosthetic valve - mild regurgitation by auscultation  Essential hypertension - controlled  Paroxysmal atrial fibrillation - controlled rate based on 24-hour Holter  Chronic anticoagulation therapy without complications - Hx of bleeding     Current medicines are reviewed at length with the patient today.  The patient does not have concerns regarding medicines.  The following changes have been made:  Discontinue aspirin. We had a long discussion concerning the management of atrial fibrillation. Anticoagulation is keep. He is at a higher risk of bleeding because of history of ulcer disease. Cautioned to observe his urine and stools.  Labs/ tests ordered today include:  No orders of the defined types were placed in this encounter.     Disposition:   FU with HS in 6 months  Signed, Sinclair Grooms, MD  03/23/2015 8:58 AM    [address], [place], [place]  [address] Phone: [phone]; Fax: [phone]

## 2015-03-23 NOTE — Patient Instructions (Addendum)
Medication Instructions:  Your physician has recommended you make the following change in your medication:  STOP Aspirin   Labwork: Your physician recommends that you return for lab work in: 6 months (Creatinine, Hemoglbin)  Testing/Procedures: Your physician has requested that you have an echocardiogram. Echocardiography is a painless test that uses sound waves to create images of your heart. It provides your doctor with information about the size and shape of your heart and how well your heart's chambers and valves are working. This procedure takes approximately one hour. There are no restrictions for this procedure.   Follow-Up: Your physician wants you to follow-up in: 6 months with Dr.Smith You will receive a reminder letter in the mail two months in advance. If you don't receive a letter, please call our office to schedule the follow-up appointment.   Any Other Special Instructions Will Be Listed Below (If Applicable). Call the office if you are experiencing dark tarry stool.  Call our office to let us know your insurance preferred anti-coagulant medication

## 2015-03-30 ENCOUNTER — Ambulatory Visit (HOSPITAL_COMMUNITY): Payer: Medicare Other | Attending: Cardiovascular Disease

## 2015-03-30 ENCOUNTER — Other Ambulatory Visit: Payer: Self-pay

## 2015-03-30 DIAGNOSIS — E785 Hyperlipidemia, unspecified: Secondary | ICD-10-CM | POA: Insufficient documentation

## 2015-03-30 DIAGNOSIS — Z953 Presence of xenogenic heart valve: Secondary | ICD-10-CM

## 2015-03-30 DIAGNOSIS — I080 Rheumatic disorders of both mitral and aortic valves: Secondary | ICD-10-CM | POA: Diagnosis not present

## 2015-03-30 DIAGNOSIS — Z954 Presence of other heart-valve replacement: Secondary | ICD-10-CM

## 2015-03-30 DIAGNOSIS — Z951 Presence of aortocoronary bypass graft: Secondary | ICD-10-CM | POA: Diagnosis not present

## 2015-03-30 DIAGNOSIS — I350 Nonrheumatic aortic (valve) stenosis: Secondary | ICD-10-CM | POA: Diagnosis present

## 2015-03-30 DIAGNOSIS — I2510 Atherosclerotic heart disease of native coronary artery without angina pectoris: Secondary | ICD-10-CM | POA: Insufficient documentation

## 2015-03-30 DIAGNOSIS — I10 Essential (primary) hypertension: Secondary | ICD-10-CM | POA: Diagnosis not present

## 2015-03-30 DIAGNOSIS — I480 Paroxysmal atrial fibrillation: Secondary | ICD-10-CM | POA: Diagnosis not present

## 2015-04-02 ENCOUNTER — Telehealth: Payer: Self-pay

## 2015-04-02 NOTE — Telephone Encounter (Signed)
-----   Message from Belva Crome, MD sent at 04/01/2015 10:49 AM EDT ----- Enlarged LA. Aortic valve is okay Heart function is okay

## 2015-04-02 NOTE — Telephone Encounter (Signed)
Pt aware of echo results  Enlarged LA. Aortic valve is okay Heart function is okay Pt verbalized understanding.

## 2015-05-01 ENCOUNTER — Telehealth: Payer: Self-pay | Admitting: Interventional Cardiology

## 2015-05-01 NOTE — Telephone Encounter (Signed)
New message      Want samples of eliquis.  Please call

## 2015-06-05 ENCOUNTER — Telehealth: Payer: Self-pay | Admitting: Interventional Cardiology

## 2015-06-05 NOTE — Telephone Encounter (Signed)
New Message  Pt calling about Eliquis samples, please call back and discuss.

## 2015-06-06 NOTE — Telephone Encounter (Signed)
Spoke with patient about his Eliquis 2.5mg . Informed him we are currently out of samples. Advised him to call back tomorrow when we hopefully will get more in. He has at least a week left of samples.

## 2015-06-07 ENCOUNTER — Other Ambulatory Visit: Payer: Self-pay | Admitting: Interventional Cardiology

## 2015-06-07 ENCOUNTER — Telehealth: Payer: Self-pay | Admitting: Interventional Cardiology

## 2015-06-07 NOTE — Telephone Encounter (Signed)
Error

## 2015-06-07 NOTE — Telephone Encounter (Signed)
Last tsh was 2014. Please advise on refill.

## 2015-06-08 ENCOUNTER — Telehealth: Payer: Self-pay | Admitting: Interventional Cardiology

## 2015-06-08 NOTE — Telephone Encounter (Signed)
New Message        Pt calling wanting to get samples of Eliquis, unable to get in touch with anyone in refill dept. Please call back and advise.

## 2015-06-08 NOTE — Telephone Encounter (Signed)
Returned pt call. Adv pt that we don't currently have any Eliquis samples. Adv pt to try back early next wk

## 2015-06-13 ENCOUNTER — Telehealth: Payer: Self-pay | Admitting: *Deleted

## 2015-06-13 NOTE — Telephone Encounter (Signed)
Eliquis samples placed at the front desk for patient. 

## 2015-06-14 ENCOUNTER — Other Ambulatory Visit: Payer: Self-pay | Admitting: *Deleted

## 2015-06-14 ENCOUNTER — Telehealth: Payer: Self-pay | Admitting: *Deleted

## 2015-06-14 MED ORDER — LEVOTHYROXINE SODIUM 50 MCG PO TABS: ORAL_TABLET | ORAL | Status: DC

## 2015-06-14 NOTE — Telephone Encounter (Signed)
Ok to refill 30 R-1. Further rqst should probably go to pt pcp

## 2015-06-14 NOTE — Telephone Encounter (Signed)
Patients wife called for a refill on patients levothyroxine. Ok to refill? Please advise. Thanks, MI

## 2015-06-28 DIAGNOSIS — Z23 Encounter for immunization: Secondary | ICD-10-CM | POA: Diagnosis not present

## 2015-07-13 ENCOUNTER — Telehealth: Payer: Self-pay

## 2015-07-13 NOTE — Telephone Encounter (Signed)
Left Eliquis 2.5 MG samples up front for patient to pick up. Belva Crome, MD at 03/23/2015 8:54 AM  apixaban (ELIQUIS) 2.5 MG TABS tabletTake 1 tablet (2.5 mg total) by mouth 2 (two) times daily Patient Instructions     Medication Instructions:  Your physician has recommended you make the following change in your medication:  STOP Aspirin

## 2015-07-23 ENCOUNTER — Telehealth: Payer: Self-pay | Admitting: Interventional Cardiology

## 2015-07-23 NOTE — Telephone Encounter (Signed)
New message      Patient calling the office for samples of medication:   1.  What medication and dosage are you requesting samples for? eliquis 2.5 mg   2.  Are you currently out of this medication? Yes   3. Are you requesting samples to get you through until you receive your prescription? Yes

## 2015-07-24 ENCOUNTER — Telehealth: Payer: Self-pay | Admitting: Interventional Cardiology

## 2015-07-24 NOTE — Telephone Encounter (Signed)
Samples of Eliquis 2.5mg  4 boxes provided to patient. He states he has been getting samples since April. Wife is checking on his Insurance benefits.

## 2015-07-24 NOTE — Telephone Encounter (Signed)
New Message   Pt wife has the name of [facility] that comes through the mail  [phone]

## 2015-07-24 NOTE — Telephone Encounter (Signed)
Message fwd to [place] who spoke with pt previously

## 2015-07-25 ENCOUNTER — Other Ambulatory Visit: Payer: Self-pay

## 2015-07-25 MED ORDER — APIXABAN 2.5 MG PO TABS: 2.5000 mg | ORAL_TABLET | Freq: Two times a day (BID) | ORAL | Status: DC

## 2015-07-27 ENCOUNTER — Other Ambulatory Visit: Payer: Self-pay

## 2015-08-07 DIAGNOSIS — I110 Hypertensive heart disease with heart failure: Secondary | ICD-10-CM | POA: Diagnosis not present

## 2015-08-07 DIAGNOSIS — E119 Type 2 diabetes mellitus without complications: Secondary | ICD-10-CM | POA: Diagnosis not present

## 2015-08-07 DIAGNOSIS — I482 Chronic atrial fibrillation: Secondary | ICD-10-CM | POA: Diagnosis not present

## 2015-08-07 DIAGNOSIS — D649 Anemia, unspecified: Secondary | ICD-10-CM | POA: Diagnosis not present

## 2015-08-07 DIAGNOSIS — Z952 Presence of prosthetic heart valve: Secondary | ICD-10-CM | POA: Diagnosis not present

## 2015-08-07 DIAGNOSIS — I5030 Unspecified diastolic (congestive) heart failure: Secondary | ICD-10-CM | POA: Diagnosis not present

## 2015-08-07 DIAGNOSIS — Z0000 Encounter for general adult medical examination without abnormal findings: Secondary | ICD-10-CM | POA: Diagnosis not present

## 2015-08-07 DIAGNOSIS — E210 Primary hyperparathyroidism: Secondary | ICD-10-CM | POA: Diagnosis not present

## 2015-08-07 DIAGNOSIS — E039 Hypothyroidism, unspecified: Secondary | ICD-10-CM | POA: Diagnosis not present

## 2015-08-07 DIAGNOSIS — K219 Gastro-esophageal reflux disease without esophagitis: Secondary | ICD-10-CM | POA: Diagnosis not present

## 2015-08-07 DIAGNOSIS — E782 Mixed hyperlipidemia: Secondary | ICD-10-CM | POA: Diagnosis not present

## 2015-08-07 DIAGNOSIS — M1990 Unspecified osteoarthritis, unspecified site: Secondary | ICD-10-CM | POA: Diagnosis not present

## 2015-08-08 ENCOUNTER — Encounter: Payer: Self-pay | Admitting: Interventional Cardiology

## 2015-08-14 ENCOUNTER — Telehealth: Payer: Self-pay | Admitting: Interventional Cardiology

## 2015-08-14 MED ORDER — APIXABAN 2.5 MG PO TABS: 2.5000 mg | ORAL_TABLET | Freq: Two times a day (BID) | ORAL | Status: DC

## 2015-08-14 NOTE — Telephone Encounter (Signed)
New message     *STAT* If patient is at the pharmacy, call can be transferred to refill team.   1. Which medications need to be refilled? (please list name of each medication and dose if known) eliquis 2.5 2. Which pharmacy/location (including street and city if local pharmacy) is medication to be sent to? [facility] order  3. Do they need a 30 day or 90 day supply? 90day

## 2015-08-14 NOTE — Telephone Encounter (Signed)
Pt's refill was sent to his pharmacy [facility] ([place])

## 2015-08-29 ENCOUNTER — Telehealth: Payer: Self-pay | Admitting: Interventional Cardiology

## 2015-08-29 NOTE — Telephone Encounter (Signed)
Called back and spoke with pt's wife Samyak Gielow and informed her per Dr. Tamala Julian that pt would have to get his medication refilled with PCP. Wife verbalized understanding.

## 2015-08-29 NOTE — Telephone Encounter (Signed)
Called pt, there was no VM and no answer. I wanted to inform pt that he would have to get his PCP to refill his medication of Levothyroxine, per phone note on 06/14/15, per Dr. Tamala Julian.

## 2015-08-29 NOTE — Telephone Encounter (Signed)
°*  STAT* If patient is at the pharmacy, call can be transferred to refill team.   1. Which medications need to be refilled? (please list name of each medication and dose if known) Levothyroxine  2. Which pharmacy/location (including street and city if local pharmacy) is medication to be sent to? [facility] [phone]  3. Do they need a 30 day or 90 day supply? 90 day

## 2015-11-06 ENCOUNTER — Other Ambulatory Visit: Payer: Self-pay | Admitting: Interventional Cardiology

## 2015-11-06 MED ORDER — APIXABAN 2.5 MG PO TABS: 2.5000 mg | ORAL_TABLET | Freq: Two times a day (BID) | ORAL | Status: DC

## 2015-11-06 NOTE — Telephone Encounter (Signed)
New message      *STAT* If patient is at the pharmacy, call can be transferred to refill team.   1. Which medications need to be refilled? (please list name of each medication and dose if known) eliquis  2.5 mg   2. Which pharmacy/location (including street and city if local pharmacy) is medication to be sent to? [facility]  [phone]   3. Do they need a 30 day or 90 day supply? 90 days supply

## 2015-11-06 NOTE — Telephone Encounter (Signed)
Has apt with Dr Tamala Julian in feb,refill escribed as requested

## 2015-11-22 ENCOUNTER — Encounter: Payer: Self-pay | Admitting: Interventional Cardiology

## 2015-11-22 ENCOUNTER — Ambulatory Visit (INDEPENDENT_AMBULATORY_CARE_PROVIDER_SITE_OTHER): Payer: Medicare Other | Admitting: Interventional Cardiology

## 2015-11-22 ENCOUNTER — Other Ambulatory Visit: Payer: Medicare Other

## 2015-11-22 VITALS — BP 148/76 | HR 99 | Ht 67.0 in | Wt 176.8 lb

## 2015-11-22 DIAGNOSIS — Z953 Presence of xenogenic heart valve: Secondary | ICD-10-CM

## 2015-11-22 DIAGNOSIS — I10 Essential (primary) hypertension: Secondary | ICD-10-CM

## 2015-11-22 DIAGNOSIS — I4892 Unspecified atrial flutter: Secondary | ICD-10-CM

## 2015-11-22 DIAGNOSIS — Z954 Presence of other heart-valve replacement: Secondary | ICD-10-CM

## 2015-11-22 DIAGNOSIS — Z951 Presence of aortocoronary bypass graft: Secondary | ICD-10-CM | POA: Diagnosis not present

## 2015-11-22 DIAGNOSIS — I480 Paroxysmal atrial fibrillation: Secondary | ICD-10-CM

## 2015-11-22 MED ORDER — METOPROLOL SUCCINATE ER 25 MG PO TB24: 25.0000 mg | ORAL_TABLET | Freq: Every day | ORAL | Status: DC

## 2015-11-22 NOTE — Progress Notes (Addendum)
Cardiology Office Note   Date:  11/22/2015   ID:  [name], DOB November 22, 1923, MRN [id number]  PCP:  Mayra Neer, MD  Cardiologist:  Sinclair Grooms, MD   Chief Complaint  Patient presents with  . Coronary Artery Disease      History of Present Illness: [name] is a 82 y.o. male who presents for aortic stenosis, coronary artery disease, bioprosthetic aortic valve, atrial fibrillation/flutter, and chronic anticoagulation therapy. Chronic left pleural effusion.  [name] is well. He has not noted blood in the urine or stool. No neurological complaints. Denies dyspnea, orthopnea, and edema. He has not had syncope. He denies angina.    Past Medical History  Diagnosis Date  . Borderline diabetes   . Hypertension   . Hypercholesteremia   . Heart murmur   . GI bleed   . Ulcer     PEPTIC ULCER DZ...DR. Watt Climes  . BPH (benign prostatic hypertrophy)   . Arthritis     OSTEO OF KNEE  . Aortic stenosis 05/27/12    Severe to critical  . Coronary artery disease     Past Surgical History  Procedure Laterality Date  . Appendectomy    . Coronary artery bypass graft  09-17-2012    CABG x 3/ AVR  . Cardiac valve replacement  09-17-2012  . Coronary artery bypass graft  09/17/2012    Procedure: CORONARY ARTERY BYPASS GRAFTING (CABG);  Surgeon: Grace Isaac, MD;  Location: [facility];  Service: Open Heart Surgery;  Laterality: N/A;  CABG x three,  using left internal mammary artery and right leg greater saphenous vein harvested endoscopically  . Intraoperative transesophageal echocardiogram  09/17/2012    Procedure: INTRAOPERATIVE TRANSESOPHAGEAL ECHOCARDIOGRAM;  Surgeon: Grace Isaac, MD;  Location: [facility];  Service: Open Heart Surgery;  Laterality: N/A;  . Aortic valve replacement  09/17/2012    Procedure: AORTIC VALVE REPLACEMENT (AVR);  Surgeon: Grace Isaac, MD;  Location: [facility];  Service: Open Heart Surgery;  Laterality: N/A;     Current Outpatient  Prescriptions  Medication Sig Dispense Refill  . apixaban (ELIQUIS) 2.5 MG TABS tablet Take 1 tablet (2.5 mg total) by mouth 2 (two) times daily. 180 tablet 1  . famotidine (PEPCID) 20 MG tablet Take 20 mg by mouth daily.    Marland Kitchen levothyroxine (SYNTHROID, LEVOTHROID) 50 MCG tablet TAKE 1 TABLET DAILY BEFORE BREAKFAST ON AN EMPTY STOMACH 90 tablet 0  . lisinopril (PRINIVIL,ZESTRIL) 10 MG tablet Take 1 tablet by mouth daily.    . simvastatin (ZOCOR) 10 MG tablet Take 10 mg by mouth daily.     No current facility-administered medications for this visit.    Allergies:   Review of patient's allergies indicates no known allergies.    Social History:  The patient  reports that he has never smoked. He has never used smokeless tobacco. He reports that he does not drink alcohol or use illicit drugs.   Family History:  The patient's family history includes Dementia in his mother; Hypertension in his father.    ROS:  Please see the history of present illness.   Otherwise, review of systems are positive for decreased hearing.   All other systems are reviewed and negative.    PHYSICAL EXAM: VS:  BP 148/76 mmHg  Pulse 99  Ht 5\' 7"  (1.702 m)  Wt 176 lb 12.8 oz (80.196 kg)  BMI 27.68 kg/m2 , BMI Body mass index is 27.68 kg/(m^2). GEN: Well nourished, well developed, in  no acute distress HEENT: normal Neck: no JVD, carotid bruits, or masses Cardiac: IIRR.  There is no murmur, rub, or gallop. There is no edema. Respiratory:  clear to auscultation bilaterally, normal work of breathing. GI: soft, nontender, nondistended, + BS MS: no deformity or atrophy Skin: warm and dry, no rash Neuro:  Strength and sensation are intact Psych: euthymic mood, full affect   EKG:  EKG is ordered today. The ekg reveals atrial fibrillation/flutter with a ventricular response of 99 bpm. Nonspecific ST-T wave changes noted.   Recent Labs: 01/31/2015: BUN 23; Creatinine, Ser 1.66*; Potassium 3.8; Sodium 136    Lipid  Panel No results found for: CHOL, TRIG, HDL, CHOLHDL, VLDL, LDLCALC, LDLDIRECT    Wt Readings from Last 3 Encounters:  11/22/15 176 lb 12.8 oz (80.196 kg)  03/23/15 169 lb 1.9 oz (76.712 kg)  01/16/15 173 lb 12.8 oz (78.835 kg)      Other studies Reviewed: Additional studies/ records that were reviewed today include: Continuous ambulatory monitor in April revealed normal heart rate response with continuous A. fib average heart rate 68 bpm.. The findings include apparent heart rate control at last office visit.    ASSESSMENT AND PLAN:  1. S/P aortic valve replacement with bioprosthetic valve No clinical evidence of valve dysfunction  2. S/P CABG (coronary artery bypass graft) Asymptomatic  3. Paroxysmal atrial fibrillation (HCC) Poor rate control  4. Atrial flutter, unspecified Poor rate control  5. Essential hypertension Borderline control    Current medicines are reviewed at length with the patient today.  The patient has the following concerns regarding medicines: None.  The following changes/actions have been instituted:    Start metoprolol succinate 25 mg daily for heart rate control and improvement in blood pressure  Labs/ tests ordered today include:  No orders of the defined types were placed in this encounter.     Disposition:   FU with HS in 1 year  Signed, Sinclair Grooms, MD  11/22/2015 8:31 AM    [address], [place], [place]  [address] Phone: [phone]; Fax: [phone]

## 2015-11-22 NOTE — Patient Instructions (Signed)
Medication Instructions:  Your physician has recommended you make the following change in your medication:  START Metoprolol 25mg  daily. An Rx has been sent to your pharmacy  Labwork: None ordered  Testing/Procedures: None ordered  Follow-Up: Your physician wants you to follow-up in: 1 year with Dr.Smith You will receive a reminder letter in the mail two months in advance. If you don't receive a letter, please call our office to schedule the follow-up appointment.   Any Other Special Instructions Will Be Listed Below (If Applicable).     If you need a refill on your cardiac medications before your next appointment, please call your pharmacy.

## 2016-02-13 ENCOUNTER — Encounter: Payer: Self-pay | Admitting: Interventional Cardiology

## 2016-02-13 DIAGNOSIS — E119 Type 2 diabetes mellitus without complications: Secondary | ICD-10-CM | POA: Diagnosis not present

## 2016-02-13 DIAGNOSIS — M1990 Unspecified osteoarthritis, unspecified site: Secondary | ICD-10-CM | POA: Diagnosis not present

## 2016-02-13 DIAGNOSIS — I482 Chronic atrial fibrillation: Secondary | ICD-10-CM | POA: Diagnosis not present

## 2016-02-13 DIAGNOSIS — D649 Anemia, unspecified: Secondary | ICD-10-CM | POA: Diagnosis not present

## 2016-02-13 DIAGNOSIS — E782 Mixed hyperlipidemia: Secondary | ICD-10-CM | POA: Diagnosis not present

## 2016-02-13 DIAGNOSIS — Z952 Presence of prosthetic heart valve: Secondary | ICD-10-CM | POA: Diagnosis not present

## 2016-02-13 DIAGNOSIS — I5030 Unspecified diastolic (congestive) heart failure: Secondary | ICD-10-CM | POA: Diagnosis not present

## 2016-02-13 DIAGNOSIS — K219 Gastro-esophageal reflux disease without esophagitis: Secondary | ICD-10-CM | POA: Diagnosis not present

## 2016-02-13 DIAGNOSIS — E210 Primary hyperparathyroidism: Secondary | ICD-10-CM | POA: Diagnosis not present

## 2016-02-13 DIAGNOSIS — I110 Hypertensive heart disease with heart failure: Secondary | ICD-10-CM | POA: Diagnosis not present

## 2016-02-13 DIAGNOSIS — E039 Hypothyroidism, unspecified: Secondary | ICD-10-CM | POA: Diagnosis not present

## 2016-02-18 DIAGNOSIS — Z961 Presence of intraocular lens: Secondary | ICD-10-CM | POA: Diagnosis not present

## 2016-02-18 DIAGNOSIS — H524 Presbyopia: Secondary | ICD-10-CM | POA: Diagnosis not present

## 2016-02-18 DIAGNOSIS — H52203 Unspecified astigmatism, bilateral: Secondary | ICD-10-CM | POA: Diagnosis not present

## 2016-04-10 DIAGNOSIS — E039 Hypothyroidism, unspecified: Secondary | ICD-10-CM | POA: Diagnosis not present

## 2016-05-06 DIAGNOSIS — L03019 Cellulitis of unspecified finger: Secondary | ICD-10-CM | POA: Diagnosis not present

## 2016-05-09 ENCOUNTER — Other Ambulatory Visit: Payer: Self-pay | Admitting: Interventional Cardiology

## 2016-05-23 DIAGNOSIS — E039 Hypothyroidism, unspecified: Secondary | ICD-10-CM | POA: Diagnosis not present

## 2016-05-27 DIAGNOSIS — L821 Other seborrheic keratosis: Secondary | ICD-10-CM | POA: Diagnosis not present

## 2016-05-27 DIAGNOSIS — D1801 Hemangioma of skin and subcutaneous tissue: Secondary | ICD-10-CM | POA: Diagnosis not present

## 2016-05-27 DIAGNOSIS — L03019 Cellulitis of unspecified finger: Secondary | ICD-10-CM | POA: Diagnosis not present

## 2016-05-27 DIAGNOSIS — B356 Tinea cruris: Secondary | ICD-10-CM | POA: Diagnosis not present

## 2016-06-04 DIAGNOSIS — Z23 Encounter for immunization: Secondary | ICD-10-CM | POA: Diagnosis not present

## 2016-06-30 ENCOUNTER — Telehealth: Payer: Self-pay | Admitting: Interventional Cardiology

## 2016-06-30 DIAGNOSIS — I4892 Unspecified atrial flutter: Secondary | ICD-10-CM

## 2016-06-30 DIAGNOSIS — I480 Paroxysmal atrial fibrillation: Secondary | ICD-10-CM

## 2016-06-30 NOTE — Telephone Encounter (Signed)
°  New Prob  Pt c/o BP issue: STAT if pt c/o blurred vision, one-sided weakness or slurred speech  1. What are your last 5 BP readings?  110/60 @ 1:15 PM.  2. Are you having any other symptoms (ex. Dizziness, headache, blurred vision, passed out)?  Wife states "he just said he does not feel good and his head does not feel good".  3. What is your BP issue?  Wife states she reported blood pressure after pt was not feeling well. States PCP recommended she contact cardiology regarding reading.

## 2016-06-30 NOTE — Telephone Encounter (Signed)
Returned pt call. Pt asked that I speak with his wife. Pt sts that he has been having increased fatigue,Pt sts that his mind races  and is able to fall asleep at night.  Pt denies chest pain, sob, swelling, syncope, palpitations. They reported a BP today of 110/60 they are unable to provide a heart rate. Pt is taking Lisinopril 10mg  daily(written by pcp) and Metoprolol 25mg  daily(written by Dr.Smith). They called his pcp today to report his BP running low and was advised to contact his cardiologist. Adv pt that his BP reading provided seems ok. Pt wife sts that the pt main complaint is being tired all the time. She does reports  that there has been some adjustments  in the pt Levothyroxine by his pcp, and he is to have repeat Tsh next month. Pt has an appt to see Dr.Shaw on 10/18. Adv Mrs.Schueller that a pcp f/u seems appropriate and the fatigue could be related to his thyroid function and lack of sleep. Adv her that I will send an update to Dr.Smith and call back if he has any additional recommendations. Pt and wife agreeable with plan and verbalized understanding.

## 2016-07-01 MED ORDER — METOPROLOL SUCCINATE ER 25 MG PO TB24: 12.5000 mg | ORAL_TABLET | Freq: Every day | ORAL | Status: DC

## 2016-07-01 NOTE — Telephone Encounter (Signed)
If blood pressure is still running low, decrease metoprolol succinate to 12.5 mg daily.

## 2016-07-01 NOTE — Telephone Encounter (Signed)
Patient aware of Dr.Smith's response. Pt sts that he will reduce Metoprolol to 12.5mg  daily and continue to ck his BP regularly. Pt will call back if BP continues to run low.

## 2016-07-22 DIAGNOSIS — E039 Hypothyroidism, unspecified: Secondary | ICD-10-CM | POA: Diagnosis not present

## 2016-07-23 ENCOUNTER — Encounter (HOSPITAL_COMMUNITY): Payer: Self-pay | Admitting: *Deleted

## 2016-07-23 ENCOUNTER — Inpatient Hospital Stay (HOSPITAL_COMMUNITY): Payer: Medicare Other

## 2016-07-23 ENCOUNTER — Inpatient Hospital Stay (HOSPITAL_COMMUNITY)
Admission: EM | Admit: 2016-07-23 | Discharge: 2016-07-25 | DRG: 645 | Disposition: A | Discharge status: Home / Self Care | Payer: Medicare Other | Attending: Family Medicine | Admitting: Family Medicine

## 2016-07-23 DIAGNOSIS — E222 Syndrome of inappropriate secretion of antidiuretic hormone: Secondary | ICD-10-CM | POA: Diagnosis present

## 2016-07-23 DIAGNOSIS — Z953 Presence of xenogenic heart valve: Secondary | ICD-10-CM | POA: Diagnosis not present

## 2016-07-23 DIAGNOSIS — N400 Enlarged prostate without lower urinary tract symptoms: Secondary | ICD-10-CM | POA: Diagnosis present

## 2016-07-23 DIAGNOSIS — I10 Essential (primary) hypertension: Secondary | ICD-10-CM | POA: Diagnosis not present

## 2016-07-23 DIAGNOSIS — Z8249 Family history of ischemic heart disease and other diseases of the circulatory system: Secondary | ICD-10-CM

## 2016-07-23 DIAGNOSIS — I4891 Unspecified atrial fibrillation: Secondary | ICD-10-CM | POA: Diagnosis present

## 2016-07-23 DIAGNOSIS — R7303 Prediabetes: Secondary | ICD-10-CM | POA: Diagnosis present

## 2016-07-23 DIAGNOSIS — I2510 Atherosclerotic heart disease of native coronary artery without angina pectoris: Secondary | ICD-10-CM | POA: Diagnosis present

## 2016-07-23 DIAGNOSIS — J90 Pleural effusion, not elsewhere classified: Secondary | ICD-10-CM | POA: Diagnosis not present

## 2016-07-23 DIAGNOSIS — Z951 Presence of aortocoronary bypass graft: Secondary | ICD-10-CM | POA: Diagnosis not present

## 2016-07-23 DIAGNOSIS — E039 Hypothyroidism, unspecified: Secondary | ICD-10-CM | POA: Diagnosis not present

## 2016-07-23 DIAGNOSIS — E871 Hypo-osmolality and hyponatremia: Secondary | ICD-10-CM | POA: Diagnosis not present

## 2016-07-23 DIAGNOSIS — R5383 Other fatigue: Secondary | ICD-10-CM | POA: Diagnosis not present

## 2016-07-23 DIAGNOSIS — Z7901 Long term (current) use of anticoagulants: Secondary | ICD-10-CM | POA: Diagnosis not present

## 2016-07-23 DIAGNOSIS — I480 Paroxysmal atrial fibrillation: Secondary | ICD-10-CM | POA: Diagnosis not present

## 2016-07-23 DIAGNOSIS — E7800 Pure hypercholesterolemia, unspecified: Secondary | ICD-10-CM | POA: Diagnosis present

## 2016-07-23 HISTORY — DX: Hypothyroidism, unspecified: E03.9

## 2016-07-23 HISTORY — DX: Syndrome of inappropriate secretion of antidiuretic hormone: E22.2

## 2016-07-23 HISTORY — DX: Hypo-osmolality and hyponatremia: E87.1

## 2016-07-23 LAB — CBC
HEMATOCRIT: 34.1 % — AB (ref 39.0–52.0)
Hemoglobin: 12.1 g/dL — ABNORMAL LOW (ref 13.0–17.0)
MCH: 30.3 pg (ref 26.0–34.0)
MCHC: 35.5 g/dL (ref 30.0–36.0)
MCV: 85.5 fL (ref 78.0–100.0)
PLATELETS: 189 10*3/uL (ref 150–400)
RBC: 3.99 MIL/uL — AB (ref 4.22–5.81)
RDW: 13.4 % (ref 11.5–15.5)
WBC: 8.5 10*3/uL (ref 4.0–10.5)

## 2016-07-23 LAB — I-STAT TROPONIN, ED: Troponin i, poc: 0.01 ng/mL (ref 0.00–0.08)

## 2016-07-23 LAB — BASIC METABOLIC PANEL
Anion gap: 7 (ref 5–15)
BUN: 11 mg/dL (ref 6–20)
CHLORIDE: 92 mmol/L — AB (ref 101–111)
CO2: 25 mmol/L (ref 22–32)
Calcium: 10.1 mg/dL (ref 8.9–10.3)
Creatinine, Ser: 1.05 mg/dL (ref 0.61–1.24)
GFR calc Af Amer: >60 mL/min (ref >60)
GFR calc non Af Amer: >60 mL/min (ref >60)
Glucose, Bld: 162 mg/dL — ABNORMAL HIGH (ref 65–99)
POTASSIUM: 4.4 mmol/L (ref 3.5–5.1)
SODIUM: 124 mmol/L — AB (ref 135–145)

## 2016-07-23 LAB — URINALYSIS, ROUTINE W REFLEX MICROSCOPIC
Bilirubin Urine: NEGATIVE
Glucose, UA: NEGATIVE mg/dL
HGB URINE DIPSTICK: NEGATIVE
Ketones, ur: NEGATIVE mg/dL
Leukocytes, UA: NEGATIVE
Nitrite: NEGATIVE
PROTEIN: NEGATIVE mg/dL
Specific Gravity, Urine: 1.012 (ref 1.005–1.030)
pH: 7 (ref 5.0–8.0)

## 2016-07-23 LAB — T4, FREE: FREE T4: 0.98 ng/dL (ref 0.61–1.12)

## 2016-07-23 LAB — OSMOLALITY, URINE: OSMOLALITY UR: 425 mosm/kg (ref 300–900)

## 2016-07-23 LAB — CORTISOL-AM, BLOOD: Cortisol - AM: 13 ug/dL (ref 6.7–22.6)

## 2016-07-23 LAB — TSH: TSH: 10.89 u[IU]/mL — AB (ref 0.350–4.500)

## 2016-07-23 LAB — SODIUM, URINE, RANDOM: Sodium, Ur: 80 mmol/L

## 2016-07-23 LAB — OSMOLALITY: Osmolality: 265 mOsm/kg — ABNORMAL LOW (ref 275–295)

## 2016-07-23 MED ORDER — SERTRALINE HCL 50 MG PO TABS: 50.0000 mg | ORAL_TABLET | Freq: Every day | ORAL | Status: DC

## 2016-07-23 MED ORDER — METOPROLOL SUCCINATE ER 25 MG PO TB24: 12.5000 mg | ORAL_TABLET | Freq: Every day | ORAL | Status: DC

## 2016-07-23 MED ORDER — APIXABAN 2.5 MG PO TABS
2.5000 mg | ORAL_TABLET | Freq: Two times a day (BID) | ORAL | Status: DC
  Administered 2016-07-24 – 2016-07-25 (×4): 2.5 mg via ORAL
  Filled 2016-07-23 (×4): qty 1

## 2016-07-23 MED ORDER — METOPROLOL SUCCINATE ER 25 MG PO TB24
12.5000 mg | ORAL_TABLET | Freq: Every day | ORAL | Status: DC
Start: 2016-07-24 — End: 2016-07-25
  Administered 2016-07-24 – 2016-07-25 (×2): 12.5 mg via ORAL
  Filled 2016-07-23 (×2): qty 1

## 2016-07-23 MED ORDER — LISINOPRIL 10 MG PO TABS
10.0000 mg | ORAL_TABLET | Freq: Every day | ORAL | Status: DC
  Administered 2016-07-24 – 2016-07-25 (×2): 10 mg via ORAL
  Filled 2016-07-23 (×2): qty 1

## 2016-07-23 MED ORDER — ADULT MULTIVITAMIN W/MINERALS CH
1.0000 | ORAL_TABLET | Freq: Every day | ORAL | Status: DC
  Administered 2016-07-24 – 2016-07-25 (×2): 1 via ORAL
  Filled 2016-07-23 (×2): qty 1

## 2016-07-23 MED ORDER — LEVOTHYROXINE SODIUM 75 MCG PO TABS
37.5000 ug | ORAL_TABLET | Freq: Every day | ORAL | Status: DC
  Administered 2016-07-24 – 2016-07-25 (×2): 37.5 ug via ORAL
  Filled 2016-07-23 (×2): qty 1

## 2016-07-23 MED ORDER — SIMVASTATIN 10 MG PO TABS: 10.0000 mg | ORAL_TABLET | Freq: Every day | ORAL | Status: DC

## 2016-07-23 MED ORDER — SODIUM CHLORIDE 0.9 % IV BOLUS (SEPSIS)
500.0000 mL | Freq: Once | INTRAVENOUS | Status: AC
  Administered 2016-07-23: 500 mL via INTRAVENOUS

## 2016-07-23 MED ORDER — SIMVASTATIN 10 MG PO TABS
10.0000 mg | ORAL_TABLET | Freq: Every day | ORAL | Status: DC
  Administered 2016-07-24 – 2016-07-25 (×2): 10 mg via ORAL
  Filled 2016-07-23 (×2): qty 1

## 2016-07-23 NOTE — H&P (Signed)
History and Physical    [name] [id number] DOB: Oct 09, 1923 DOA: 07/23/2016   PCP: Mayra Neer, MD Chief Complaint:  Chief Complaint  Patient presents with  . Abnormal Lab    HPI: [name] is a 82 y.o. male with medical history significant of bioprosthetic aortic valve, BPH, CAD s/p CABG, HT, atrial fibrillation, hypothyroidism on eliquis presents to ED for abnormal lab values. Patient reports he had abnormal thyroid function test approximately 6 weeks ago and adjustments were made to his synthroid dose. He was seen by his PCP today for follow up/recheck and noted to have a sodium level of 125 and TSH 12.35. Patient was instructed to come to the ED for further evaluation. Patient does state he has had increasing fatigue over the last month as well as cold intolerance and polyuria. He has experienced intermittent muscle cramps in his lower extremities b/l. He also states he has intermittent lightheadedness typically brought on by positional changes. He states he has been eating as normal, but maybe not drinking as much water. He was recently started on Monday on Zoloft for anxiety. He denies fever, trouble swallowing, changes in vision, shortness of breath, cough, chest pain, abdominal pain, N/V, dysuria, hematuria, rash, LOC, numbness, weakness.  ED Course: In the ED patients TSH was 10.8; however, he actually is euthyroid with a fT4 of 0.98.  Sodium 124, serum OSM 265, urine OSM is 425 however.  Creatinine and BUN are WNL.  Hospitalist is asked to consult on hyponatremia.  Review of Systems: As per HPI otherwise 10 point review of systems negative.    Past Medical History:  Diagnosis Date  . Aortic stenosis 05/27/12   Severe to critical  . Arthritis    OSTEO OF KNEE  . Borderline diabetes   . BPH (benign prostatic hypertrophy)   . Coronary artery disease   . GI bleed   . Heart murmur   . Hypercholesteremia   . Hypertension   . Ulcer ([facility])    PEPTIC ULCER  DZ...DR. Watt Climes    Past Surgical History:  Procedure Laterality Date  . AORTIC VALVE REPLACEMENT  09/17/2012   Procedure: AORTIC VALVE REPLACEMENT (AVR);  Surgeon: Grace Isaac, MD;  Location: [facility];  Service: Open Heart Surgery;  Laterality: N/A;  . APPENDECTOMY    . CARDIAC VALVE REPLACEMENT  09-17-2012  . CORONARY ARTERY BYPASS GRAFT  09-17-2012   CABG x 3/ AVR  . CORONARY ARTERY BYPASS GRAFT  09/17/2012   Procedure: CORONARY ARTERY BYPASS GRAFTING (CABG);  Surgeon: Grace Isaac, MD;  Location: [facility];  Service: Open Heart Surgery;  Laterality: N/A;  CABG x three,  using left internal mammary artery and right leg greater saphenous vein harvested endoscopically  . INTRAOPERATIVE TRANSESOPHAGEAL ECHOCARDIOGRAM  09/17/2012   Procedure: INTRAOPERATIVE TRANSESOPHAGEAL ECHOCARDIOGRAM;  Surgeon: Grace Isaac, MD;  Location: [facility];  Service: Open Heart Surgery;  Laterality: N/A;     reports that he has never smoked. He has never used smokeless tobacco. He reports that he does not drink alcohol or use drugs.  No Known Allergies  Family History  Problem Relation Age of Onset  . Hypertension Father   . Dementia Mother       Prior to Admission medications   Medication Sig Start Date End Date Taking? Authorizing Provider  ELIQUIS 2.5 MG TABS tablet TAKE 1 TABLET (2.5 MG TOTAL) BY MOUTH 2 (TWO) TIMES DAILY. 05/09/16  Yes Belva Crome, MD  famotidine (PEPCID) 20 MG tablet  Take 20 mg by mouth daily.   Yes Historical Provider, MD  levothyroxine (SYNTHROID, LEVOTHROID) 25 MCG tablet Take 37.5 mcg by mouth daily before breakfast.   Yes Historical Provider, MD  lisinopril (PRINIVIL,ZESTRIL) 10 MG tablet Take 1 tablet by mouth daily. 03/08/15  Yes Historical Provider, MD  metoprolol succinate (TOPROL XL) 25 MG 24 hr tablet Take 0.5 tablets (12.5 mg total) by mouth daily. 07/01/16  Yes Belva Crome, MD  Multiple Vitamin (MULTIVITAMIN WITH MINERALS) TABS tablet Take 1 tablet by mouth daily.    Yes Historical Provider, MD  sertraline (ZOLOFT) 50 MG tablet Take 50 mg by mouth daily.   Yes Historical Provider, MD  simvastatin (ZOCOR) 10 MG tablet Take 10 mg by mouth daily. 12/01/14  Yes Historical Provider, MD    Physical Exam: Vitals:   07/23/16 1800 07/23/16 1830 07/23/16 1900 07/23/16 2000  BP: 159/59 158/68 175/74 164/71  Pulse: 68 64 73 67  Resp: 16 18 15 11   Temp:      TempSrc:      SpO2: 100% 100% 99% 99%      Constitutional: NAD, calm, comfortable Eyes: PERRL, lids and conjunctivae normal ENMT: Mucous membranes are moist. Posterior pharynx clear of any exudate or lesions.Normal dentition.  Neck: normal, supple, no masses, no thyromegaly Respiratory: clear to auscultation bilaterally, no wheezing, no crackles. Normal respiratory effort. No accessory muscle use.  Cardiovascular: Regular rate and rhythm, no murmurs / rubs / gallops. No extremity edema. 2+ pedal pulses. No carotid bruits.  Abdomen: no tenderness, no masses palpated. No hepatosplenomegaly. Bowel sounds positive.  Musculoskeletal: no clubbing / cyanosis. No joint deformity upper and lower extremities. Good ROM, no contractures. Normal muscle tone.  Skin: no rashes, lesions, ulcers. No induration Neurologic: CN 2-12 grossly intact. Sensation intact, DTR normal. Strength 5/5 in all 4.  Psychiatric: Normal judgment and insight. Alert and oriented x 3. Normal mood.    Labs on Admission: I have personally reviewed following labs and imaging studies  CBC:  Recent Labs Lab 07/23/16 1522  WBC 8.5  HGB 12.1*  HCT 34.1*  MCV 85.5  PLT 99991111   Basic Metabolic Panel:  Recent Labs Lab 07/23/16 1522  NA 124*  K 4.4  CL 92*  CO2 25  GLUCOSE 162*  BUN 11  CREATININE 1.05  CALCIUM 10.1   GFR: CrCl cannot be calculated (Unknown ideal weight.). Liver Function Tests: No results for input(s): AST, ALT, ALKPHOS, BILITOT, PROT, ALBUMIN in the last 168 hours. No results for input(s): LIPASE, AMYLASE in  the last 168 hours. No results for input(s): AMMONIA in the last 168 hours. Coagulation Profile: No results for input(s): INR, PROTIME in the last 168 hours. Cardiac Enzymes: No results for input(s): CKTOTAL, CKMB, CKMBINDEX, TROPONINI in the last 168 hours. BNP (last 3 results) No results for input(s): PROBNP in the last 8760 hours. HbA1C: No results for input(s): HGBA1C in the last 72 hours. CBG: No results for input(s): GLUCAP in the last 168 hours. Lipid Profile: No results for input(s): CHOL, HDL, LDLCALC, TRIG, CHOLHDL, LDLDIRECT in the last 72 hours. Thyroid Function Tests:  Recent Labs  07/23/16 1649  TSH 10.890*  FREET4 0.98   Anemia Panel: No results for input(s): VITAMINB12, FOLATE, FERRITIN, TIBC, IRON, RETICCTPCT in the last 72 hours. Urine analysis:    Component Value Date/Time   COLORURINE AMBER (A) 07/23/2016 1702   APPEARANCEUR CLEAR 07/23/2016 1702   LABSPEC 1.012 07/23/2016 1702   PHURINE 7.0 07/23/2016 1702  GLUCOSEU NEGATIVE 07/23/2016 1702   HGBUR NEGATIVE 07/23/2016 1702   BILIRUBINUR NEGATIVE 07/23/2016 1702   KETONESUR NEGATIVE 07/23/2016 1702   PROTEINUR NEGATIVE 07/23/2016 1702   UROBILINOGEN 0.2 09/17/2012 0358   NITRITE NEGATIVE 07/23/2016 1702   LEUKOCYTESUR NEGATIVE 07/23/2016 1702   Sepsis Labs: @LABRCNTIP (procalcitonin:4,lacticidven:4) )No results found for this or any previous visit (from the past 240 hour(s)).   Radiological Exams on Admission: No results found.  EKG: Independently reviewed.  Assessment/Plan Principal Problem:   Hyponatremia Active Problems:   Atrial fibrillation (HCC)   Essential hypertension   SIADH (syndrome of inappropriate ADH production) (HCC)   Hypothyroidism    1. Hyponatremia - overall picture is suspicious for SIADH: his T4 is normal today, despite this he has hyponatremia, low serum osmolality but inappropriately high urine osmolality.  This suggests to me that there may be something else  driving other than hypothyroidism. 1. Overall suspicious for SIADH in this patient 2. Checking urine sodium 3. Checking AM cortisol 4. CXR 5. Will trial fluid restriction to 1.2L / day 6. BMP in AM 2. Hypothyroidism - 1. had been on 12.70mcg synthroid daily previously.  Plan per PCP was to do 37.5 mcg / day through Sunday then go down to 64mcg per day.  Will go ahead and follow this plan. 2. TSH is 10, but fT4 is actually WNL.  This is c/w diagnosis of "subclinical hypothyroidism". 3. Again will correct with increased synthroid doses, but again Im not sure this "subclinical hypothyroidism" is severe enough to be causing such a profound drop in sodium and serum osm by itself.  Of course as I told family, if im wrong about this, hyponatremia will resolve as we treat this anyhow. 3. A.Fib - continue rate control and eliquis 4. HTN - continue ACEi, beta blocker   DVT prophylaxis: Eliquis Code Status: full Family Communication: Family at bedside Consults called: None Admission status: Admit to inpatient   Etta Quill DO [facility] Pager [phone] from 7PM-7AM  If 7AM-7PM, please contact the day physician for the patient [email or web address] Password [facility]  07/23/2016, 8:33 PM

## 2016-07-23 NOTE — ED Provider Notes (Signed)
[place] [facility] DEPT Provider Note   CSN: [place]:[id number] Arrival date & time: 07/23/16  1501     History   Chief Complaint Chief Complaint  Patient presents with  . Abnormal Lab    HPI [name] is a 82 y.o. male.  [name] is a 82 y.o. male with h/o bioprosthetic aortic valve, BPH, CAD s/p CABG, HT, atrial fibrillation, hypothyroidism on eliquis presents to ED for abnormal lab values. Patient reports he had abnormal thyroid function test approximately 6 weeks ago and adjustments were made to his synthroid dose. He was seen by his PCP today for follow up/recheck and noted to have a sodium level of 125 and TSH 12.35. Patient was instructed to come to the ED for further evaluation. Patient does state he has had increasing fatigue over the last month as well as cold intolerance and polyuria. He has experienced intermittent muscle cramps in his lower extremities b/l. He also states he has intermittent lightheadedness typically brought on by positional changes. He states he has been eating as normal, but maybe not drinking as much water. He was recently started on Monday on Zoloft for anxiety. He denies fever, trouble swallowing, changes in vision, shortness of breath, cough, chest pain, abdominal pain, N/V, dysuria, hematuria, rash, LOC, numbness, weakness.       Past Medical History:  Diagnosis Date  . Aortic stenosis 05/27/12   Severe to critical  . Arthritis    OSTEO OF KNEE  . Borderline diabetes   . BPH (benign prostatic hypertrophy)   . Coronary artery disease   . GI bleed   . Heart murmur   . Hypercholesteremia   . Hypertension   . Ulcer ([place])    PEPTIC ULCER DZ...DR. Watt Climes    Patient Active Problem List   Diagnosis Date Noted  . SIADH (syndrome of inappropriate ADH production) ([place]) 07/23/2016  . Hyponatremia 07/23/2016  . Hypothyroidism 07/23/2016  . Atrial flutter, unspecified 01/16/2015  . Essential hypertension 01/16/2014  . Atrial fibrillation ([place])  09/21/2012  . S/P CABG (coronary artery bypass graft) 09/16/2012    Class: Chronic  . Ulcer ([place])   . BPH (benign prostatic hypertrophy)   . Arthritis   . S/P aortic valve replacement with bioprosthetic valve     Past Surgical History:  Procedure Laterality Date  . AORTIC VALVE REPLACEMENT  09/17/2012   Procedure: AORTIC VALVE REPLACEMENT (AVR);  Surgeon: Grace Isaac, MD;  Location: [facility];  Service: Open Heart Surgery;  Laterality: N/A;  . APPENDECTOMY    . CARDIAC VALVE REPLACEMENT  09-17-2012  . CORONARY ARTERY BYPASS GRAFT  09-17-2012   CABG x 3/ AVR  . CORONARY ARTERY BYPASS GRAFT  09/17/2012   Procedure: CORONARY ARTERY BYPASS GRAFTING (CABG);  Surgeon: Grace Isaac, MD;  Location: [facility];  Service: Open Heart Surgery;  Laterality: N/A;  CABG x three,  using left internal mammary artery and right leg greater saphenous vein harvested endoscopically  . INTRAOPERATIVE TRANSESOPHAGEAL ECHOCARDIOGRAM  09/17/2012   Procedure: INTRAOPERATIVE TRANSESOPHAGEAL ECHOCARDIOGRAM;  Surgeon: Grace Isaac, MD;  Location: [facility];  Service: Open Heart Surgery;  Laterality: N/A;       Home Medications    Prior to Admission medications   Medication Sig Start Date End Date Taking? Authorizing Provider  ELIQUIS 2.5 MG TABS tablet TAKE 1 TABLET (2.5 MG TOTAL) BY MOUTH 2 (TWO) TIMES DAILY. 05/09/16  Yes Belva Crome, MD  famotidine (PEPCID) 20 MG tablet Take 20 mg by mouth  daily.   Yes Historical Provider, MD  levothyroxine (SYNTHROID, LEVOTHROID) 25 MCG tablet Take 37.5 mcg by mouth daily before breakfast.   Yes Historical Provider, MD  lisinopril (PRINIVIL,ZESTRIL) 10 MG tablet Take 1 tablet by mouth daily. 03/08/15  Yes Historical Provider, MD  metoprolol succinate (TOPROL XL) 25 MG 24 hr tablet Take 0.5 tablets (12.5 mg total) by mouth daily. 07/01/16  Yes Belva Crome, MD  Multiple Vitamin (MULTIVITAMIN WITH MINERALS) TABS tablet Take 1 tablet by mouth daily.   Yes Historical Provider,  MD  sertraline (ZOLOFT) 50 MG tablet Take 50 mg by mouth daily.   Yes Historical Provider, MD  simvastatin (ZOCOR) 10 MG tablet Take 10 mg by mouth daily. 12/01/14  Yes Historical Provider, MD    Family History Family History  Problem Relation Age of Onset  . Hypertension Father   . Dementia Mother     Social History Social History  Substance Use Topics  . Smoking status: Never Smoker  . Smokeless tobacco: Never Used  . Alcohol use No     Allergies   Review of patient's allergies indicates no known allergies.   Review of Systems Review of Systems  Constitutional: Positive for fatigue. Negative for fever.  HENT: Negative for trouble swallowing.   Eyes: Negative for visual disturbance.  Respiratory: Negative for cough and shortness of breath.   Cardiovascular: Negative for chest pain, palpitations and leg swelling.  Gastrointestinal: Negative for abdominal pain, blood in stool, constipation, diarrhea, nausea and vomiting.  Endocrine: Positive for cold intolerance and polyuria.  Genitourinary: Negative for dysuria and hematuria.  Musculoskeletal: Positive for myalgias.  Skin: Negative for rash.  Neurological: Negative for dizziness, syncope, facial asymmetry, speech difficulty, weakness and numbness.     Physical Exam Updated Vital Signs BP 155/63 (BP Location: Left Arm)   Pulse 67   Temp 97.6 F (36.4 C) (Oral)   Resp 16   SpO2 100%   Physical Exam  Constitutional: He appears well-developed and well-nourished. No distress.  HENT:  Head: Normocephalic and atraumatic.  Mouth/Throat: Oropharynx is clear and moist. No oropharyngeal exudate.  Eyes: Conjunctivae and EOM are normal. Pupils are equal, round, and reactive to light. Right eye exhibits no discharge. Left eye exhibits no discharge. No scleral icterus.  Neck: Normal range of motion and phonation normal. Neck supple. No neck rigidity. Normal range of motion present.  Cardiovascular: Normal rate, regular  rhythm, normal heart sounds and intact distal pulses.   No murmur heard. Pulmonary/Chest: Effort normal and breath sounds normal. No stridor. No respiratory distress. He has no wheezes. He has no rales.  Abdominal: Soft. Bowel sounds are normal. He exhibits no distension. There is no tenderness. There is no rigidity, no rebound, no guarding and no CVA tenderness.  Musculoskeletal: Normal range of motion.  No lower extremity swelling or tenderness to palpation of posterior calf.   Lymphadenopathy:    He has no cervical adenopathy.  Neurological: He is alert. He is not disoriented. Coordination and gait normal. GCS eye subscore is 4. GCS verbal subscore is 5. GCS motor subscore is 6.  Skin: Skin is dry. He is not diaphoretic.  Skin is cool to touch.   Psychiatric: He has a normal mood and affect. His behavior is normal.     ED Treatments / Results  Labs (all labs ordered are listed, but only abnormal results are displayed) Labs Reviewed  CBC - Abnormal; Notable for the following:       Result Value  RBC 3.99 (*)    Hemoglobin 12.1 (*)    HCT 34.1 (*)    All other components within normal limits  BASIC METABOLIC PANEL - Abnormal; Notable for the following:    Sodium 124 (*)    Chloride 92 (*)    Glucose, Bld 162 (*)    All other components within normal limits  TSH - Abnormal; Notable for the following:    TSH 10.890 (*)    All other components within normal limits  URINALYSIS, ROUTINE W REFLEX MICROSCOPIC (NOT AT [facility]) - Abnormal; Notable for the following:    Color, Urine AMBER (*)    All other components within normal limits  OSMOLALITY - Abnormal; Notable for the following:    Osmolality 265 (*)    All other components within normal limits  T4, FREE  OSMOLALITY, URINE  SODIUM, URINE, RANDOM  CORTISOL-AM, BLOOD  BASIC METABOLIC PANEL  I-STAT TROPOININ, ED    EKG  EKG Interpretation  Date/Time:  Wednesday July 23 2016 17:04:49 EDT Ventricular Rate:  69 PR  Interval:    QRS Duration: 111 QT Interval:  409 QTC Calculation: 439 R Axis:   103 Text Interpretation:  Sinus rhythm Atrial premature complex Prolonged PR interval Right axis deviation LBBB noted on prior ECG not present on current ECG Confirmed by KNAPP  MD-J, JON [id number]) on 07/23/2016 5:23:30 PM       Radiology  Procedures Procedures (including critical care time)  Medications Ordered in ED Medications  levothyroxine (SYNTHROID, LEVOTHROID) tablet 37.5 mcg (not administered)  apixaban (ELIQUIS) tablet 2.5 mg (not administered)  multivitamin with minerals tablet 1 tablet (not administered)  metoprolol succinate (TOPROL-XL) 24 hr tablet 12.5 mg (not administered)  simvastatin (ZOCOR) tablet 10 mg (not administered)  lisinopril (PRINIVIL,ZESTRIL) tablet 10 mg (not administered)  sodium chloride 0.9 % bolus 500 mL (0 mLs Intravenous Stopped 07/23/16 1924)     Initial Impression / Assessment and Plan / ED Course  I have reviewed the triage vital signs and the nursing notes.  Pertinent labs & imaging results that were available during my care of the patient were reviewed by me and considered in my medical decision making (see chart for details).  Clinical Course  Value Comment By Time   Pt was seen and examined.  Pt presented to the ED with complaints of low sodium and low thyroid.  Pt was seen in the office today to follow up on his thyroid.  Pt had lab tests that revealed a low sodium.  Family was called at home and was told to bring him to the ED.  He has had fatigue.   He has been cold. Dorie Rank, MD 10/18 1630   On re-evaluation patient endorses improvement following fluids. Roxanna Mew, PA-C 10/18 2000  RBC: (!) 3.99 (Reviewed) Dorie Rank, MD 10/18 2022    Patient presents to ED from PCP with abnormal lab values. Patient is afebrile and non-toxic appearing in NAD. VSS. Na remarkable at 125 and TSH 12.35. Patient endorses general fatigue, cold intolerance, polyuria,  and myalgias. Physical exam is re-assuring. Will repeat labs, given general fatigue will check EKG, troponin, will also check serum and urine osmolality.   Troponin negative. EKG shows sinus rhythm with prolonged PR interval and RAD. Repeat labs remarkable for hyponatremia at 124; small bolus of IVF given. Elevated TSH at 10.89, free t4 nml - subclinical hypothyroidism, patient has had recent changes to medications. Serum osmolality low and urine osmolality normal comparatively. No evidence  of diabetes insipidus.  Hyperglycemia without AG. Anemia stable at 12.1. No leukocytosis. U/A negative for UTI. Will consult to hospitalist for further guidance regarding management of hyponatremia. Discussed patient with Dr. Tomi Bamberger who also evaluated patient, agrees with plan.   8:25 PM: Spoke with Dr. Gloris Ham of [facility], greatly appreciate his time and input. Given low serum osmolality with elevated urine osmolality concern for SIADH. Patient did recently start taking zoloft - ?could be possible cause. Recommends admission for further evaluation and management of possible SIADH.    Final Clinical Impressions(s) / ED Diagnoses   Final diagnoses:  SIADH (syndrome of inappropriate ADH production) [facility])    New Prescriptions Current Discharge Medication List       Roxanna Mew, [place] 07/23/16 2247    Dorie Rank, MD 07/24/16 2312

## 2016-07-23 NOTE — ED Notes (Signed)
Assisted pt in using the urinal.

## 2016-07-23 NOTE — ED Notes (Signed)
Phlebotomy at the bedside  

## 2016-07-23 NOTE — ED Triage Notes (Signed)
Pt reports generally not feeling well. Pt was seen at his PCP today and told that his sodium was 125 and that his thyroid level was abnormal as well.

## 2016-07-23 NOTE — ED Notes (Signed)
Caryl Pina, PA at bedside at this time.

## 2016-07-23 NOTE — ED Notes (Signed)
Patient transported to X-ray 

## 2016-07-23 NOTE — ED Notes (Signed)
Assisted patient to standing position beside stretcher so patient could urinate in urinal.

## 2016-07-23 NOTE — ED Notes (Signed)
Dr. Alcario Drought at bedside with patient/family at this time.

## 2016-07-23 NOTE — Care Management (Signed)
CM reviewed [name]'s record [name] presented to [facility] ED from PCP office today for abnormal labs. CM met with [name] and [name] at bedside. [name] lives with [name] at home, [name] states they have a very supportive  daughter [name] [phone]) who checks on them frequently, ED evaluation still in progress.

## 2016-07-23 NOTE — ED Notes (Signed)
Family made aware of pts bed assignment 

## 2016-07-23 NOTE — ED Notes (Signed)
Attempted report x1. 

## 2016-07-24 DIAGNOSIS — E039 Hypothyroidism, unspecified: Secondary | ICD-10-CM

## 2016-07-24 DIAGNOSIS — E871 Hypo-osmolality and hyponatremia: Secondary | ICD-10-CM

## 2016-07-24 DIAGNOSIS — I10 Essential (primary) hypertension: Secondary | ICD-10-CM

## 2016-07-24 DIAGNOSIS — I480 Paroxysmal atrial fibrillation: Secondary | ICD-10-CM

## 2016-07-24 LAB — BASIC METABOLIC PANEL
ANION GAP: 7 (ref 5–15)
BUN: 9 mg/dL (ref 6–20)
CHLORIDE: 94 mmol/L — AB (ref 101–111)
CO2: 24 mmol/L (ref 22–32)
Calcium: 9.7 mg/dL (ref 8.9–10.3)
Creatinine, Ser: 0.98 mg/dL (ref 0.61–1.24)
GFR calc Af Amer: >60 mL/min (ref >60)
GLUCOSE: 99 mg/dL (ref 65–99)
POTASSIUM: 4 mmol/L (ref 3.5–5.1)
Sodium: 125 mmol/L — ABNORMAL LOW (ref 135–145)

## 2016-07-24 MED ORDER — ONDANSETRON HCL 4 MG PO TABS: 4.0000 mg | ORAL_TABLET | Freq: Three times a day (TID) | ORAL | Status: DC | PRN

## 2016-07-24 MED ORDER — DIPHENHYDRAMINE HCL 12.5 MG/5ML PO ELIX
12.5000 mg | ORAL_SOLUTION | Freq: Once | ORAL | Status: AC
  Administered 2016-07-24: 12.5 mg via ORAL
  Filled 2016-07-24: qty 10

## 2016-07-24 NOTE — Progress Notes (Signed)
PROGRESS NOTE    [name]  [id number] DOB: Sep 28, 1924 DOA: 07/23/2016 PCP: Mayra Neer, MD   Brief Narrative: [name] is a 82 y.o. male with a history of bioprosthetic aortic valve, BPH, CAD s/p CABG, HT, hypothyroidism, atrial fibrillation on Eliquis. Patient was admitted for hyponatremia.   Assessment & Plan:   Principal Problem:   Hyponatremia Active Problems:   Atrial fibrillation ([facility])   Essential hypertension   SIADH (syndrome of inappropriate ADH production) ([facility])   Hypothyroidism   Hyponatremia, mild Asymptomatic. Sodium improved slightly today. Likely secondary to recent SSRI initiation. Discontinued on admission. AM cortisol normal -continue fluid restriction -BMP in AM  Hypothyroidism Subclinical -continue synthroid  Atrial fibrillation Rate controlled. -continue Eliquis -continue metoprolol  Hypertension -continue metoprolol -continue lisinopril    DVT prophylaxis: Eliquis Code Status: Full code Family Communication: None at bedside Disposition Plan: Discharge home tomorrow   Consultants:   None  Procedures:  None  Antimicrobials:   None    Subjective: Patient reports being weak. Otherwise, no complaints.  Objective: Vitals:   07/23/16 2000 07/23/16 2215 07/24/16 0640 07/24/16 1336  BP: 164/71 (!) 160/67 (!) 161/55 131/64  Pulse: 67  69 67  Resp: 11 16 18 17   Temp:  97.9 F (36.6 C) 98.2 F (36.8 C) 98.2 F (36.8 C)  TempSrc:  Oral Oral Oral  SpO2: 99%  98% 99%    Intake/Output Summary (Last 24 hours) at 07/24/16 1541 Last data filed at 07/24/16 0600  Gross per 24 hour  Intake             1240 ml  Output              440 ml  Net              800 ml   There were no vitals filed for this visit.  Examination:  General exam: Appears calm and comfortable  Respiratory system: Clear to auscultation. Respiratory effort normal. Cardiovascular system: S1 & S2 heard, RRR. No murmurs, rubs, gallops or  clicks. Gastrointestinal system: Abdomen is nondistended, soft and nontender. Normal bowel sounds heard. Central nervous system: Alert and oriented. No focal neurological deficits. Extremities: No edema. No calf tenderness Skin: No cyanosis. No rashes Psychiatry: Judgement and insight appear normal. Mood & affect appropriate.     Data Reviewed: I have personally reviewed following labs and imaging studies  CBC:  Recent Labs Lab 07/23/16 1522  WBC 8.5  HGB 12.1*  HCT 34.1*  MCV 85.5  PLT 99991111   Basic Metabolic Panel:  Recent Labs Lab 07/23/16 1522 07/24/16 0535  NA 124* 125*  K 4.4 4.0  CL 92* 94*  CO2 25 24  GLUCOSE 162* 99  BUN 11 9  CREATININE 1.05 0.98  CALCIUM 10.1 9.7   GFR: CrCl cannot be calculated (Unknown ideal weight.). Liver Function Tests: No results for input(s): AST, ALT, ALKPHOS, BILITOT, PROT, ALBUMIN in the last 168 hours. No results for input(s): LIPASE, AMYLASE in the last 168 hours. No results for input(s): AMMONIA in the last 168 hours. Coagulation Profile: No results for input(s): INR, PROTIME in the last 168 hours. Cardiac Enzymes: No results for input(s): CKTOTAL, CKMB, CKMBINDEX, TROPONINI in the last 168 hours. BNP (last 3 results) No results for input(s): PROBNP in the last 8760 hours. HbA1C: No results for input(s): HGBA1C in the last 72 hours. CBG: No results for input(s): GLUCAP in the last 168 hours. Lipid Profile: No results for input(s): CHOL, HDL,  LDLCALC, TRIG, CHOLHDL, LDLDIRECT in the last 72 hours. Thyroid Function Tests:  Recent Labs  07/23/16 1649  TSH 10.890*  FREET4 0.98   Anemia Panel: No results for input(s): VITAMINB12, FOLATE, FERRITIN, TIBC, IRON, RETICCTPCT in the last 72 hours. Sepsis Labs: No results for input(s): PROCALCITON, LATICACIDVEN in the last 168 hours.  No results found for this or any previous visit (from the past 240 hour(s)).       Radiology Studies: Dg Chest 2 View  Result Date:  07/23/2016 CLINICAL DATA:  Chronic extreme fatigue.  Initial encounter. EXAM: CHEST  2 VIEW COMPARISON:  Chest radiograph performed 02/15/2013 FINDINGS: A small left pleural effusion is noted. Left basilar airspace opacity raises concern for pneumonia. No pneumothorax is seen. The lungs are hyperexpanded, with flattening of the hemidiaphragms, compatible with COPD. The cardiomediastinal silhouette is borderline normal in size. The patient is status post median sternotomy, with evidence of prior CABG. An aortic valve replacement is noted. No acute osseous abnormalities are seen. IMPRESSION: 1. Small left pleural effusion noted. Left basilar airspace opacity raises concern for pneumonia. Followup PA and lateral chest X-ray is recommended in 3-4 weeks following trial of antibiotic therapy to ensure resolution and exclude underlying malignancy. 2. Findings of COPD. Electronically Signed   By: Garald Balding M.D.   On: 07/23/2016 21:13        Scheduled Meds: . apixaban  2.5 mg Oral BID  . levothyroxine  37.5 mcg Oral QAC breakfast  . lisinopril  10 mg Oral Daily  . metoprolol succinate  12.5 mg Oral Daily  . multivitamin with minerals  1 tablet Oral Daily  . simvastatin  10 mg Oral Daily   Continuous Infusions:    LOS: 1 day     Cordelia Poche [facility] 07/24/2016, 3:41 PM Pager: [phone]  If 7PM-7AM, please contact night-coverage [email or web address] Password [email or web address] 07/24/2016, 3:41 PM

## 2016-07-24 NOTE — Progress Notes (Signed)
Courtesy visit family friend.  Says he slept some.  Still feels weak.  Appreciate current care.  Ruta Hinds, MD [facility] Office: [phone] Pager: [phone]

## 2016-07-25 DIAGNOSIS — E222 Syndrome of inappropriate secretion of antidiuretic hormone: Principal | ICD-10-CM

## 2016-07-25 LAB — BASIC METABOLIC PANEL
ANION GAP: 11 (ref 5–15)
BUN: 9 mg/dL (ref 6–20)
CHLORIDE: 92 mmol/L — AB (ref 101–111)
CO2: 21 mmol/L — AB (ref 22–32)
Calcium: 10 mg/dL (ref 8.9–10.3)
Creatinine, Ser: 0.93 mg/dL (ref 0.61–1.24)
GFR calc non Af Amer: >60 mL/min (ref >60)
Glucose, Bld: 108 mg/dL — ABNORMAL HIGH (ref 65–99)
POTASSIUM: 4.3 mmol/L (ref 3.5–5.1)
SODIUM: 124 mmol/L — AB (ref 135–145)

## 2016-07-25 MED ORDER — FUROSEMIDE 20 MG PO TABS
20.0000 mg | ORAL_TABLET | Freq: Every day | ORAL | Status: DC
  Administered 2016-07-25: 20 mg via ORAL
  Filled 2016-07-25: qty 1

## 2016-07-25 MED ORDER — FUROSEMIDE 20 MG PO TABS: 20.0000 mg | ORAL_TABLET | Freq: Every day | ORAL | 0 refills | Status: DC

## 2016-07-25 MED ORDER — SODIUM CHLORIDE 1 G PO TABS
2.0000 g | ORAL_TABLET | Freq: Once | ORAL | Status: AC
  Administered 2016-07-25: 2 g via ORAL
  Filled 2016-07-25: qty 2

## 2016-07-25 NOTE — Discharge Instructions (Addendum)
Mr. Cifarelli, you were admitted for low sodium. To treat this, you will need to restrict your water intake to no more than 860mL. Please follow-up with your primary care physician on Monday to recheck your sodium. I am prescribing a water pill to help improve your sodium. This is likely secondary to a combination of recently starting Zoloft and your hypothyroidism. Please continue Synthroid as your primary care physician prescribed [Synthroid 37.62mcg (1.5 tablets) tomorrow, 10/21, followed by Synthroid 10mcg (1tab) on Sunday, 10/22.  Syndrome of Inappropriate Antidiuretic Hormone Syndrome of inappropriate antidiuretic hormone (SIADH) is when excess antidiuretic hormone (ADH) is produced in the body. This hormone is produced in a part of the brain called the hypothalamus. ADH controls water levels in the body. When too much of the hormone is produced, this can cause problems such as low salt (sodium) levels in your blood and reduced urine secretion. The body retains too much water, which can cause various symptoms. Symptoms can range from mild to severe. SIADH is most common in people who are hospitalized. CAUSES  Several conditions can cause SIADH, including:  Heart failure.  Injured or diseased hypothalamus.  Infection or inflammation in the brain, such as meningitis or encephalitis.  Lung disease or cancer.  Tumor.  Psychosis.  Major surgery.  Stroke.  Guillain-Barre syndrome.  Medicines, including chemotherapy drugs, NSAIDs, and pain killers.  Abnormalities in thyroid or parathyroid hormones.  Human immunodeficiency virus (HIV).  Hereditary causes. RISK FACTORS  Increasing age.  Recent major surgery.  Hospitalization. SIGNS AND SYMPTOMS  Irritability.  Cramps.  Nausea or vomiting.  Mental changes, such as memory problems, aggressiveness, confusion, or hallucinations.  Seizure.  Coma. DIAGNOSIS  Diagnosis may include:  Medical history and physical exam.  Blood  tests.  Urine tests.  Chest X-ray or brain scan. TREATMENT  Treatments can help reduce the symptoms of SIADH. Treatment may include the following:  Restriction of your fluid intake.  Saline given through an IV tube or oral salt tablets to raise sodium levels to normal.  Medicines to help eliminate water from the body (diuretics).  Medicines to help control sodium levels. Various treatments may also be done to manage the underlying cause of your SIADH. HOME CARE INSTRUCTIONS  Follow your health care provider's instructions on restricting your intake of fluids.  Take medicines only as directed by your health care provider.  Keep all follow-up visits as directed by your health care provider. This is important. SEEK MEDICAL CARE IF:  You have cramping.  You are nauseous or vomiting.  You have mental changes, such as memory problems, aggressiveness, confusion, or hallucinations. SEEK IMMEDIATE MEDICAL CARE IF: You have a seizure or loss of consciousness.    This information is not intended to replace advice given to you by your health care provider. Make sure you discuss any questions you have with your health care provider.   Document Released: 04/19/2014 Document Reviewed: 04/19/2014 Elsevier Interactive Patient Education [employer].

## 2016-07-25 NOTE — Discharge Summary (Addendum)
Physician Discharge Summary  [name] [id number] DOB: 09/22/1924 DOA: 07/23/2016  PCP: Mayra Neer, MD  Admit date: 07/23/2016 Discharge date: 07/25/2016  Admitted From: Home Disposition:  Home  Recommendations for Outpatient Follow-up:  1. Follow up with PCP on 07/28/2016 2. Please obtain BMP to recheck sodium on 07/28/2016 3. Repeat CXR in 2-3 weeks. If develops clinical symptoms of pneumonia, would treat for community acquired pneumonia   Discharge Condition: Stable CODE STATUS: Full code Diet recommendation: Salt liberalized diet   Brief/Interim Summary: [name] is a 82 y.o. male with a history of bioprosthetic aortic valve, BPH, CAD s/p CABG, HT, hypothyroidism, atrial fibrillation on Eliquis. Patient was admitted for hyponatremia with a sodium of 124.   Hospital course:  Mild hyponatremia Asymptomatic. Secondary to SIADH. Patient was fluid restricted. Sodium remained stable at around 124. Zoloft discontinued. Urine osmolality elevated with high urine sodium and low serum osmolality. Curbside with nephrologist, Dr. Justin Mend, who recommended patient be followed closely outpatient and started on diuretics.   Hypothyroidism TSH elevated to just under 11 with normal T4. Subclinical hypothyroidism with symptoms of fatigue. Continued PCP's plan of synthroid 37.52mcg followed by 54mcg daily in two days (10/22)  Atrial fibrillation Rate controlled. Continued Eliquis and metoprolol  Hypertension Continued metoprolol and lisinopril  Lung effusion, left This has been present for years. Patient is asymptomatic. No evidence of pneumonia. No antibiotics started. Can consider fluid analysis as an outpatient.   Discharge Diagnoses:  Principal Problem:   Hyponatremia Active Problems:   Atrial fibrillation (HCC)   Essential hypertension   SIADH (syndrome of inappropriate ADH production) ([place])   Hypothyroidism    Discharge Instructions     Medication List     STOP taking these medications   sertraline 50 MG tablet Commonly known as:  ZOLOFT     TAKE these medications   ELIQUIS 2.5 MG Tabs tablet Generic drug:  apixaban TAKE 1 TABLET (2.5 MG TOTAL) BY MOUTH 2 (TWO) TIMES DAILY.   famotidine 20 MG tablet Commonly known as:  PEPCID Take 20 mg by mouth daily.   furosemide 20 MG tablet Commonly known as:  LASIX Take 1 tablet (20 mg total) by mouth daily. Start taking on:  07/26/2016   levothyroxine 25 MCG tablet Commonly known as:  SYNTHROID, LEVOTHROID Take 37.5 mcg by mouth daily before breakfast.   lisinopril 10 MG tablet Commonly known as:  PRINIVIL,ZESTRIL Take 1 tablet by mouth daily.   metoprolol succinate 25 MG 24 hr tablet Commonly known as:  TOPROL XL Take 0.5 tablets (12.5 mg total) by mouth daily.   multivitamin with minerals Tabs tablet Take 1 tablet by mouth daily.   simvastatin 10 MG tablet Commonly known as:  ZOCOR Take 10 mg by mouth daily.       No Known Allergies  Consultations:  Nephrology (telephone conversation)   Procedures/Studies: Dg Chest 2 View  Result Date: 07/23/2016 CLINICAL DATA:  Chronic extreme fatigue.  Initial encounter. EXAM: CHEST  2 VIEW COMPARISON:  Chest radiograph performed 02/15/2013 FINDINGS: A small left pleural effusion is noted. Left basilar airspace opacity raises concern for pneumonia. No pneumothorax is seen. The lungs are hyperexpanded, with flattening of the hemidiaphragms, compatible with COPD. The cardiomediastinal silhouette is borderline normal in size. The patient is status post median sternotomy, with evidence of prior CABG. An aortic valve replacement is noted. No acute osseous abnormalities are seen. IMPRESSION: 1. Small left pleural effusion noted. Left basilar airspace opacity raises concern for pneumonia. Followup PA  and lateral chest X-ray is recommended in 3-4 weeks following trial of antibiotic therapy to ensure resolution and exclude underlying malignancy.  2. Findings of COPD. Electronically Signed   By: Garald Balding M.D.   On: 07/23/2016 21:13      Subjective: Patient reports some weakness. Otherwise, no complaints.  Discharge Exam: Vitals:   07/25/16 0551 07/25/16 0757  BP: (!) 147/63 (!) 143/66  Pulse: 75 72  Resp: 18   Temp: 97.6 F (36.4 C)    Vitals:   07/24/16 1336 07/24/16 2222 07/25/16 0551 07/25/16 0757  BP: 131/64 (!) 155/47 (!) 147/63 (!) 143/66  Pulse: 67 69 75 72  Resp: 17 18 18    Temp: 98.2 F (36.8 C) 98 F (36.7 C) 97.6 F (36.4 C)   TempSrc: Oral Oral Oral   SpO2: 99% 100% 99%   Weight:   70.6 kg (155 lb 9.6 oz)   Height:   5\' 8"  (1.727 m)     General exam: Appears calm and comfortable  Respiratory system: Clear to auscultation. Respiratory effort normal. Cardiovascular system: S1 & S2 heard, RRR. No murmurs, rubs, gallops or clicks. Gastrointestinal system: Abdomen is nondistended, soft and nontender. Normal bowel sounds heard. Central nervous system: Alert and oriented. No focal neurological deficits. Extremities: No edema. No calf tenderness Skin: No cyanosis. No rashes Psychiatry: Judgement and insight appear normal. Mood & affect appropriate.   The results of significant diagnostics from this hospitalization (including imaging, microbiology, ancillary and laboratory) are listed below for reference.     Microbiology: No results found for this or any previous visit (from the past 240 hour(s)).   Labs: BNP (last 3 results) No results for input(s): BNP in the last 8760 hours. Basic Metabolic Panel:  Recent Labs Lab 07/23/16 1522 07/24/16 0535 07/25/16 0731  NA 124* 125* 124*  K 4.4 4.0 4.3  CL 92* 94* 92*  CO2 25 24 21*  GLUCOSE 162* 99 108*  BUN 11 9 9   CREATININE 1.05 0.98 0.93  CALCIUM 10.1 9.7 10.0   Liver Function Tests: No results for input(s): AST, ALT, ALKPHOS, BILITOT, PROT, ALBUMIN in the last 168 hours. No results for input(s): LIPASE, AMYLASE in the last 168 hours. No  results for input(s): AMMONIA in the last 168 hours. CBC:  Recent Labs Lab 07/23/16 1522  WBC 8.5  HGB 12.1*  HCT 34.1*  MCV 85.5  PLT 189   Thyroid function studies  Recent Labs  07/23/16 1649  TSH 10.890*   Anemia work up No results for input(s): VITAMINB12, FOLATE, FERRITIN, TIBC, IRON, RETICCTPCT in the last 72 hours. Urinalysis    Component Value Date/Time   COLORURINE AMBER (A) 07/23/2016 1702   APPEARANCEUR CLEAR 07/23/2016 1702   LABSPEC 1.012 07/23/2016 1702   PHURINE 7.0 07/23/2016 1702   GLUCOSEU NEGATIVE 07/23/2016 1702   HGBUR NEGATIVE 07/23/2016 1702   BILIRUBINUR NEGATIVE 07/23/2016 1702   KETONESUR NEGATIVE 07/23/2016 1702   PROTEINUR NEGATIVE 07/23/2016 1702   UROBILINOGEN 0.2 09/17/2012 0358   NITRITE NEGATIVE 07/23/2016 1702   LEUKOCYTESUR NEGATIVE 07/23/2016 1702   Sepsis Labs Invalid input(s): PROCALCITONIN,  WBC,  LACTICIDVEN Microbiology No results found for this or any previous visit (from the past 240 hour(s)).   Time coordinating discharge: Over 30 minutes  SIGNED:   Cordelia Poche, MD [facility] 07/25/2016, 3:21 PM Pager [phone]  If 7PM-7AM, please contact night-coverage [email or web address] Password [email or web address]

## 2016-07-25 NOTE — Progress Notes (Signed)
[name] Lie to be D/C'd to home per MD order.  Discussed with the patient and all questions fully answered.  VSS, Skin clean, dry and intact without evidence of skin break down, no evidence of skin tears noted. IV catheter discontinued intact. Site without signs and symptoms of complications. Dressing and pressure applied.  An After Visit Summary was printed and given to the patient. Patient received prescription.  D/c education completed with patient/family including follow up instructions, medication list, d/c activities limitations if indicated, with other d/c instructions as indicated by MD - patient able to verbalize understanding, all questions fully answered.   Patient instructed to return to ED, call 911, or call MD for any changes in condition.   Patient escorted via [place], and D/C home via private auto.  [name] 07/25/2016 5:21 PM

## 2016-07-25 NOTE — Consult Note (Signed)
[facility] CM Primary Care Navigator  07/25/2016  [name] [birth year] [id number]  Met with patient and wife [name]) at the bedside to identify possible discharge needs. Patient shared that he had been feeling lousy and weak for few weeks ("washed out" and "no energy"); went to see primary care provider and found out that sodium and thyroid levels were low which led to this admission.  Patient and wife endorsed Dr. Mayra Neer from [place] [facility] as the primary care provider.    Patient shared using [facility] for regular prescriptions and locally uses [facility] at  [place] to obtain medications without difficulty. Wife is assisting patient in managing his medications using "pill box" system as stated.  Patient's wife had been providing transportation to his doctors' appointments. Daughter [name]) also assists with transportation as needed. Wife serves as the primary caregiver at home but daughter is also very supportive of their needs as stated by wife.  Patient and wife voiced understanding to call primary care provider's office once discharged, for a post discharge follow-up appointment within a week or sooner if needs arise.  Wife's concern regarding medication (salt tablet) that patient is suppose to be taking as mentioned by MD this am was addressed with RN. Both wife and patient denied any further needs or concerns at this time.    For additional questions please contact:  Edwena Felty A. Alif Petrak, BSN, RN-BC [facility] PRIMARY CARE Navigator Cell: [phone]

## 2016-07-28 ENCOUNTER — Other Ambulatory Visit: Payer: Self-pay | Admitting: Family Medicine

## 2016-07-28 ENCOUNTER — Ambulatory Visit
Admission: RE | Admit: 2016-07-28 | Discharge: 2016-07-28 | Disposition: A | Discharge status: Home / Self Care | Payer: Medicare Other | Source: Ambulatory Visit | Attending: Family Medicine | Admitting: Family Medicine

## 2016-07-28 DIAGNOSIS — R410 Disorientation, unspecified: Secondary | ICD-10-CM | POA: Diagnosis not present

## 2016-07-28 DIAGNOSIS — E871 Hypo-osmolality and hyponatremia: Secondary | ICD-10-CM | POA: Diagnosis not present

## 2016-07-28 DIAGNOSIS — R918 Other nonspecific abnormal finding of lung field: Secondary | ICD-10-CM | POA: Diagnosis not present

## 2016-07-30 DIAGNOSIS — E871 Hypo-osmolality and hyponatremia: Secondary | ICD-10-CM | POA: Diagnosis not present

## 2016-08-04 DIAGNOSIS — I110 Hypertensive heart disease with heart failure: Secondary | ICD-10-CM | POA: Diagnosis not present

## 2016-08-04 DIAGNOSIS — E871 Hypo-osmolality and hyponatremia: Secondary | ICD-10-CM | POA: Diagnosis not present

## 2016-08-04 DIAGNOSIS — E039 Hypothyroidism, unspecified: Secondary | ICD-10-CM | POA: Diagnosis not present

## 2016-08-04 DIAGNOSIS — R918 Other nonspecific abnormal finding of lung field: Secondary | ICD-10-CM | POA: Diagnosis not present

## 2016-08-04 DIAGNOSIS — R410 Disorientation, unspecified: Secondary | ICD-10-CM | POA: Diagnosis not present

## 2016-08-21 ENCOUNTER — Ambulatory Visit
Admission: RE | Admit: 2016-08-21 | Discharge: 2016-08-21 | Disposition: A | Discharge status: Home / Self Care | Payer: Medicare Other | Source: Ambulatory Visit | Attending: Family Medicine | Admitting: Family Medicine

## 2016-08-21 ENCOUNTER — Other Ambulatory Visit: Payer: Self-pay | Admitting: Family Medicine

## 2016-08-21 DIAGNOSIS — R918 Other nonspecific abnormal finding of lung field: Secondary | ICD-10-CM

## 2016-08-21 DIAGNOSIS — J189 Pneumonia, unspecified organism: Secondary | ICD-10-CM

## 2016-08-21 DIAGNOSIS — J90 Pleural effusion, not elsewhere classified: Secondary | ICD-10-CM | POA: Diagnosis not present

## 2016-08-25 DIAGNOSIS — E039 Hypothyroidism, unspecified: Secondary | ICD-10-CM | POA: Diagnosis not present

## 2016-08-26 ENCOUNTER — Other Ambulatory Visit: Payer: Self-pay | Admitting: Family Medicine

## 2016-08-26 DIAGNOSIS — R918 Other nonspecific abnormal finding of lung field: Secondary | ICD-10-CM

## 2016-08-29 ENCOUNTER — Emergency Department (HOSPITAL_COMMUNITY): Payer: Medicare Other

## 2016-08-29 ENCOUNTER — Encounter (HOSPITAL_COMMUNITY): Payer: Self-pay | Admitting: Emergency Medicine

## 2016-08-29 ENCOUNTER — Emergency Department (HOSPITAL_COMMUNITY)
Admission: EM | Admit: 2016-08-29 | Discharge: 2016-08-29 | Disposition: A | Discharge status: Home / Self Care | Payer: Medicare Other | Attending: Emergency Medicine | Admitting: Emergency Medicine

## 2016-08-29 DIAGNOSIS — Y929 Unspecified place or not applicable: Secondary | ICD-10-CM | POA: Insufficient documentation

## 2016-08-29 DIAGNOSIS — I10 Essential (primary) hypertension: Secondary | ICD-10-CM | POA: Diagnosis not present

## 2016-08-29 DIAGNOSIS — Z23 Encounter for immunization: Secondary | ICD-10-CM | POA: Insufficient documentation

## 2016-08-29 DIAGNOSIS — Y939 Activity, unspecified: Secondary | ICD-10-CM | POA: Insufficient documentation

## 2016-08-29 DIAGNOSIS — E039 Hypothyroidism, unspecified: Secondary | ICD-10-CM | POA: Diagnosis not present

## 2016-08-29 DIAGNOSIS — S4992XA Unspecified injury of left shoulder and upper arm, initial encounter: Secondary | ICD-10-CM | POA: Diagnosis present

## 2016-08-29 DIAGNOSIS — S42202A Unspecified fracture of upper end of left humerus, initial encounter for closed fracture: Secondary | ICD-10-CM | POA: Diagnosis not present

## 2016-08-29 DIAGNOSIS — S7002XA Contusion of left hip, initial encounter: Secondary | ICD-10-CM | POA: Diagnosis not present

## 2016-08-29 DIAGNOSIS — W109XXA Fall (on) (from) unspecified stairs and steps, initial encounter: Secondary | ICD-10-CM | POA: Diagnosis not present

## 2016-08-29 DIAGNOSIS — S42252A Displaced fracture of greater tuberosity of left humerus, initial encounter for closed fracture: Secondary | ICD-10-CM | POA: Insufficient documentation

## 2016-08-29 DIAGNOSIS — S79912A Unspecified injury of left hip, initial encounter: Secondary | ICD-10-CM | POA: Diagnosis not present

## 2016-08-29 DIAGNOSIS — S80212A Abrasion, left knee, initial encounter: Secondary | ICD-10-CM | POA: Diagnosis not present

## 2016-08-29 DIAGNOSIS — Z79899 Other long term (current) drug therapy: Secondary | ICD-10-CM | POA: Diagnosis not present

## 2016-08-29 DIAGNOSIS — Z951 Presence of aortocoronary bypass graft: Secondary | ICD-10-CM | POA: Insufficient documentation

## 2016-08-29 DIAGNOSIS — Z7901 Long term (current) use of anticoagulants: Secondary | ICD-10-CM | POA: Diagnosis not present

## 2016-08-29 DIAGNOSIS — Y999 Unspecified external cause status: Secondary | ICD-10-CM | POA: Diagnosis not present

## 2016-08-29 DIAGNOSIS — I2510 Atherosclerotic heart disease of native coronary artery without angina pectoris: Secondary | ICD-10-CM | POA: Insufficient documentation

## 2016-08-29 DIAGNOSIS — M25551 Pain in right hip: Secondary | ICD-10-CM | POA: Diagnosis not present

## 2016-08-29 DIAGNOSIS — T148XXA Other injury of unspecified body region, initial encounter: Secondary | ICD-10-CM | POA: Diagnosis not present

## 2016-08-29 DIAGNOSIS — S42292A Other displaced fracture of upper end of left humerus, initial encounter for closed fracture: Secondary | ICD-10-CM | POA: Diagnosis not present

## 2016-08-29 DIAGNOSIS — M25512 Pain in left shoulder: Secondary | ICD-10-CM | POA: Diagnosis not present

## 2016-08-29 MED ORDER — BACITRACIN ZINC 500 UNIT/GM EX OINT
TOPICAL_OINTMENT | Freq: Two times a day (BID) | CUTANEOUS | Status: DC
  Administered 2016-08-29: 1 via TOPICAL
  Filled 2016-08-29: qty 0.9

## 2016-08-29 MED ORDER — HYDROCODONE-ACETAMINOPHEN 5-325 MG PO TABS
1.0000 | ORAL_TABLET | Freq: Once | ORAL | Status: AC
  Administered 2016-08-29: 1 via ORAL
  Filled 2016-08-29: qty 1

## 2016-08-29 MED ORDER — TETANUS-DIPHTH-ACELL PERTUSSIS 5-2.5-18.5 LF-MCG/0.5 IM SUSP
0.5000 mL | Freq: Once | INTRAMUSCULAR | Status: AC
  Administered 2016-08-29: 0.5 mL via INTRAMUSCULAR
  Filled 2016-08-29: qty 0.5

## 2016-08-29 MED ORDER — HYDROCODONE-ACETAMINOPHEN 5-325 MG PO TABS: 1.0000 | ORAL_TABLET | Freq: Four times a day (QID) | ORAL | 0 refills | Status: DC | PRN

## 2016-08-29 NOTE — ED Notes (Signed)
Bed: [id number] Expected date: 08/29/16 Expected time: 9:38 AM Means of arrival:  Comments: Fall, left arm pain

## 2016-08-29 NOTE — Discharge Instructions (Signed)
Wear the sling for comfort. Hold an ice pack over your left shoulder 4 times daily for 30 minutes at a time. Call Dr.Gioffree's on Monday, 09/01/2016 to arrange to be seen next week. Take the pain medicine prescribed as needed for bad pain or Tylenol for mild pain. Don't take the pain medication prescribed together with Tylenol as the combination can be dangerous to your liver

## 2016-08-29 NOTE — ED Notes (Signed)
ED Provider at bedside. 

## 2016-08-29 NOTE — ED Triage Notes (Signed)
Patient here via EMS from home with complaints of fall today while going down the stairs. Reports losing balance. Left should injury. Pain 10/10. Denies LOC, hitting head.

## 2016-08-29 NOTE — ED Provider Notes (Signed)
[facility] DEPT Provider Note   CSN: [id number] Arrival date & time: 08/29/16  0946     History   Chief Complaint Chief Complaint  Patient presents with  . Fall  . Shoulder Injury    HPI [name] is a 82 y.o. male.  HPI patient was feeling well until he lost his balance and fell this morning falling down one step injuring left shoulder. Also complains of mild hip pain since arrival here to the emergency department. He was brought by EMS treated with sling in the field. Sharp pain is worse with movement and improved with remaining still. No other complaint. No other associated symptoms.  Past Medical History:  Diagnosis Date  . Aortic stenosis 05/27/12   Severe to critical  . Arthritis    OSTEO OF KNEE  . Borderline diabetes   . BPH (benign prostatic hypertrophy)   . Coronary artery disease   . GI bleed   . Heart murmur   . Hypercholesteremia   . Hypertension   . Ulcer ([place])    PEPTIC ULCER DZ...DR. Watt Climes    Patient Active Problem List   Diagnosis Date Noted  . SIADH (syndrome of inappropriate ADH production) ([place]) 07/23/2016  . Hyponatremia 07/23/2016  . Hypothyroidism 07/23/2016  . Atrial flutter, unspecified 01/16/2015  . Essential hypertension 01/16/2014  . Atrial fibrillation ([place]) 09/21/2012  . S/P CABG (coronary artery bypass graft) 09/16/2012    Class: Chronic  . Ulcer ([place])   . BPH (benign prostatic hypertrophy)   . Arthritis   . S/P aortic valve replacement with bioprosthetic valve     Past Surgical History:  Procedure Laterality Date  . AORTIC VALVE REPLACEMENT  09/17/2012   Procedure: AORTIC VALVE REPLACEMENT (AVR);  Surgeon: Grace Isaac, MD;  Location: [facility];  Service: Open Heart Surgery;  Laterality: N/A;  . APPENDECTOMY    . CARDIAC VALVE REPLACEMENT  09-17-2012  . CORONARY ARTERY BYPASS GRAFT  09-17-2012   CABG x 3/ AVR  . CORONARY ARTERY BYPASS GRAFT  09/17/2012   Procedure: CORONARY ARTERY BYPASS GRAFTING (CABG);   Surgeon: Grace Isaac, MD;  Location: [facility];  Service: Open Heart Surgery;  Laterality: N/A;  CABG x three,  using left internal mammary artery and right leg greater saphenous vein harvested endoscopically  . INTRAOPERATIVE TRANSESOPHAGEAL ECHOCARDIOGRAM  09/17/2012   Procedure: INTRAOPERATIVE TRANSESOPHAGEAL ECHOCARDIOGRAM;  Surgeon: Grace Isaac, MD;  Location: [facility];  Service: Open Heart Surgery;  Laterality: N/A;       Home Medications    Prior to Admission medications   Medication Sig Start Date End Date Taking? Authorizing Provider  ELIQUIS 2.5 MG TABS tablet TAKE 1 TABLET (2.5 MG TOTAL) BY MOUTH 2 (TWO) TIMES DAILY. 05/09/16   Belva Crome, MD  famotidine (PEPCID) 20 MG tablet Take 20 mg by mouth daily.    Historical Provider, MD  furosemide (LASIX) 20 MG tablet Take 1 tablet (20 mg total) by mouth daily. 07/26/16   Mariel Aloe, MD  levothyroxine (SYNTHROID, LEVOTHROID) 25 MCG tablet Take 37.5 mcg by mouth daily before breakfast.    Historical Provider, MD  lisinopril (PRINIVIL,ZESTRIL) 10 MG tablet Take 1 tablet by mouth daily. 03/08/15   Historical Provider, MD  metoprolol succinate (TOPROL XL) 25 MG 24 hr tablet Take 0.5 tablets (12.5 mg total) by mouth daily. 07/01/16   Belva Crome, MD  Multiple Vitamin (MULTIVITAMIN WITH MINERALS) TABS tablet Take 1 tablet by mouth daily.    Historical Provider,  MD  simvastatin (ZOCOR) 10 MG tablet Take 10 mg by mouth daily. 12/01/14   Historical Provider, MD    Family History Family History  Problem Relation Age of Onset  . Hypertension Father   . Dementia Mother     Social History Social History  Substance Use Topics  . Smoking status: Never Smoker  . Smokeless tobacco: Never Used  . Alcohol use No     Allergies   Patient has no known allergies.   Review of Systems Review of Systems  Constitutional: Negative.   HENT: Negative.   Respiratory: Negative.   Cardiovascular: Negative.   Gastrointestinal: Negative.     Musculoskeletal: Positive for arthralgias.  Skin: Negative.   Neurological: Negative.   Psychiatric/Behavioral: Negative.   All other systems reviewed and are negative.    Physical Exam Updated Vital Signs BP 136/82 (BP Location: Right Arm)   Pulse 60   Temp 98.3 F (36.8 C) (Oral)   Resp 17   SpO2 99%   Physical Exam  Constitutional: He appears well-developed and well-nourished. No distress.  HENT:  Head: Normocephalic and atraumatic.  Eyes: Conjunctivae are normal. Pupils are equal, round, and reactive to light.  Neck: Neck supple. No tracheal deviation present. No thyromegaly present.  Cardiovascular: Normal rate and regular rhythm.   No murmur heard. Pulmonary/Chest: Effort normal and breath sounds normal.  Abdominal: Soft. Bowel sounds are normal. He exhibits no distension. There is no tenderness.  Musculoskeletal: Normal range of motion. He exhibits no edema or tenderness.  Entire spine is nontender. Pelvis stable.. Left lower extremity minimally tender at hip. No pain on internal or external rotation of thigh. There is a dime-sized abrasion over the anterior knee without swelling or tenderness. DP pulse 2+. Left upper extremity tender and swollen overlying shoulder with pain on active motion. Radial pulse 2+. All other extremities or contusion abrasion or tenderness, neurovascularly intact  Neurological: He is alert. Coordination normal.  Skin: Skin is warm and dry. No rash noted.  Psychiatric: He has a normal mood and affect.  Nursing note and vitals reviewed.    ED Treatments / Results  Labs (all labs ordered are listed, but only abnormal results are displayed) Labs Reviewed - No data to display  EKG  EKG Interpretation None       Radiology Dg Shoulder Left  Result Date: 08/29/2016 CLINICAL DATA:  Fall.  Injury. EXAM: LEFT SHOULDER - 2+ VIEW COMPARISON:  No recent prior. FINDINGS: Subcapital fracture noted of the left humerus. Fracture of the greater  tuberosity noted. Fractures are displaced. IMPRESSION: Left humeral subcapital fracture with fracture of the greater tuberosity. Electronically Signed   By: Marcello Moores  Register   On: 08/29/2016 10:35    Procedures Procedures (including critical care time)  Medications Ordered in ED Medications  HYDROcodone-acetaminophen (NORCO/VICODIN) 5-325 MG per tablet 1 tablet (not administered)    X-rays viewed by me. Case discussed with Dr. supple. Plan sling, ice, prescription Norco. Follow-up in office 5-7 days Initial Impression / Assessment and Plan / ED Course  I have reviewed the triage vital signs and the nursing notes.  Pertinent labs & imaging results that were available during my care of the patient were reviewed by me and considered in my medical decision making (see chart for details). Received  Tdap Clinical Course    12:45 PM pain improved after treatment with hydrocodone-acetaminophen he is alert and  not lightheaded on standing feels ready to go home. Abrasion on right knee dressed  Final  Clinical Impressions(s) / ED Diagnoses  Diagnosis #1 fall #2 closed fracture of left proximal humerus #3 contusion left hip #4 abrasion left knee Final diagnoses:  None    New Prescriptions New Prescriptions   No medications on file     Orlie Dakin, MD 08/29/16 1251

## 2016-09-03 ENCOUNTER — Other Ambulatory Visit: Payer: Medicare Other

## 2016-09-04 DIAGNOSIS — S42292A Other displaced fracture of upper end of left humerus, initial encounter for closed fracture: Secondary | ICD-10-CM | POA: Diagnosis not present

## 2016-09-04 DIAGNOSIS — M25512 Pain in left shoulder: Secondary | ICD-10-CM | POA: Diagnosis not present

## 2016-09-22 DIAGNOSIS — D649 Anemia, unspecified: Secondary | ICD-10-CM | POA: Diagnosis not present

## 2016-09-22 DIAGNOSIS — E210 Primary hyperparathyroidism: Secondary | ICD-10-CM | POA: Diagnosis not present

## 2016-09-22 DIAGNOSIS — E119 Type 2 diabetes mellitus without complications: Secondary | ICD-10-CM | POA: Diagnosis not present

## 2016-09-22 DIAGNOSIS — Z0000 Encounter for general adult medical examination without abnormal findings: Secondary | ICD-10-CM | POA: Diagnosis not present

## 2016-09-22 DIAGNOSIS — I110 Hypertensive heart disease with heart failure: Secondary | ICD-10-CM | POA: Diagnosis not present

## 2016-09-22 DIAGNOSIS — E039 Hypothyroidism, unspecified: Secondary | ICD-10-CM | POA: Diagnosis not present

## 2016-09-22 DIAGNOSIS — I5030 Unspecified diastolic (congestive) heart failure: Secondary | ICD-10-CM | POA: Diagnosis not present

## 2016-09-22 DIAGNOSIS — Z952 Presence of prosthetic heart valve: Secondary | ICD-10-CM | POA: Diagnosis not present

## 2016-09-22 DIAGNOSIS — K219 Gastro-esophageal reflux disease without esophagitis: Secondary | ICD-10-CM | POA: Diagnosis not present

## 2016-09-22 DIAGNOSIS — I359 Nonrheumatic aortic valve disorder, unspecified: Secondary | ICD-10-CM | POA: Diagnosis not present

## 2016-09-22 DIAGNOSIS — E782 Mixed hyperlipidemia: Secondary | ICD-10-CM | POA: Diagnosis not present

## 2016-09-22 DIAGNOSIS — I482 Chronic atrial fibrillation: Secondary | ICD-10-CM | POA: Diagnosis not present

## 2016-09-23 DIAGNOSIS — S42292D Other displaced fracture of upper end of left humerus, subsequent encounter for fracture with routine healing: Secondary | ICD-10-CM | POA: Diagnosis not present

## 2016-10-01 DIAGNOSIS — E871 Hypo-osmolality and hyponatremia: Secondary | ICD-10-CM | POA: Diagnosis not present

## 2016-10-21 DIAGNOSIS — S42292D Other displaced fracture of upper end of left humerus, subsequent encounter for fracture with routine healing: Secondary | ICD-10-CM | POA: Diagnosis not present

## 2016-10-28 ENCOUNTER — Other Ambulatory Visit: Payer: Self-pay | Admitting: Interventional Cardiology

## 2016-10-28 DIAGNOSIS — I4892 Unspecified atrial flutter: Secondary | ICD-10-CM

## 2016-10-28 DIAGNOSIS — I480 Paroxysmal atrial fibrillation: Secondary | ICD-10-CM

## 2016-11-04 DIAGNOSIS — E039 Hypothyroidism, unspecified: Secondary | ICD-10-CM | POA: Diagnosis not present

## 2016-11-18 ENCOUNTER — Ambulatory Visit (INDEPENDENT_AMBULATORY_CARE_PROVIDER_SITE_OTHER): Payer: Medicare Other | Admitting: Surgery

## 2016-11-18 ENCOUNTER — Ambulatory Visit (INDEPENDENT_AMBULATORY_CARE_PROVIDER_SITE_OTHER): Payer: Medicare Other

## 2016-11-18 ENCOUNTER — Encounter (INDEPENDENT_AMBULATORY_CARE_PROVIDER_SITE_OTHER): Payer: Self-pay | Admitting: Surgery

## 2016-11-18 DIAGNOSIS — M25532 Pain in left wrist: Secondary | ICD-10-CM | POA: Diagnosis not present

## 2016-11-18 DIAGNOSIS — S52572A Other intraarticular fracture of lower end of left radius, initial encounter for closed fracture: Secondary | ICD-10-CM | POA: Diagnosis not present

## 2016-11-18 NOTE — Progress Notes (Signed)
Office Visit Note   Patient: [name]           Date of Birth: July 20, 1924           MRN: [id number] Visit Date: 11/18/2016              Requested by: Mayra Neer, MD [address] [place], [place] [address] PCP: Mayra Neer, MD   Assessment & Plan: Visit Diagnoses:  1. Pain in left wrist   2. Other closed intra-articular fracture of distal end of left radius, initial encounter     Plan: Patient seen with Dr. Hinton Dyer today. He was put in a stockinette and removable wrist splint. We'll wear this at all times except when showering. Follow-up in 1 week for recheck with repeat x-ray. Patient also complained of a bump on the right posterior lateral hip. States that he noticed this a couple months ago. This is not painful. This was seen after he suffered a fall hurting the left hip. We will get x-ray right hip when he returns for follow-up visit. Follow-Up Instructions: Return in about 1 week (around 11/25/2016) for Dr. Lorin Mercy for recheck of left wrist.   Orders:  Orders Placed This Encounter  Procedures  . XR Wrist Complete Left   No orders of the defined types were placed in this encounter.     Procedures: No procedures performed   Clinical Data: No additional findings.   Subjective: Chief Complaint  Patient presents with  . Left Wrist - Injury, Pain, Edema    Mr. [name] is here today after a fall around noon today. He states he was coming back into the house from getting the mail and tripped off the porch, he landed on left side. He is having a lot of left wrist pain and swelling.    Injury    Patient states that he does not recall losing consciousness. He is having ongoing issues with balance. No other injury.  Review of Systems  Constitutional: Negative.   HENT: Negative.   Respiratory: Negative.   Cardiovascular: Negative.   Genitourinary: Negative.   Musculoskeletal: Positive for joint swelling.  Skin: Negative.     Psychiatric/Behavioral: Negative.      Objective: Vital Signs: There were no vitals taken for this visit.  Physical Exam  Constitutional: He is oriented to person, place, and time. No distress.  HENT:  Head: Normocephalic and atraumatic.  Pulmonary/Chest: No respiratory distress.  Abdominal: He exhibits no distension.  Musculoskeletal:  Left wrist is limited range of motion to stiffness and pain. Distal radius is mild to moderately tender. He does have some swelling although not extreme. No bruising. Neurovascular intact.  Neurological: He is alert and oriented to person, place, and time.  Skin: Skin is warm and dry.  Psychiatric: He has a normal mood and affect.    Ortho Exam  Specialty Comments:  No specialty comments available.  Imaging: Xr Wrist Complete Left  Result Date: 11/18/2016 X-ray left wrist shows a comminuted distal radius fracture. Minimally displaced. Extends into the joint.    PMFS History: Patient Active Problem List   Diagnosis Date Noted  . SIADH (syndrome of inappropriate ADH production) ([place]) 07/23/2016  . Hyponatremia 07/23/2016  . Hypothyroidism 07/23/2016  . Atrial flutter, unspecified 01/16/2015  . Essential hypertension 01/16/2014  . Atrial fibrillation ([place]) 09/21/2012  . S/P CABG (coronary artery bypass graft) 09/16/2012    Class: Chronic  . Ulcer ([place])   . BPH (benign prostatic hypertrophy)   .  Arthritis   . S/P aortic valve replacement with bioprosthetic valve    Past Medical History:  Diagnosis Date  . Aortic stenosis 05/27/12   Severe to critical  . Arthritis    OSTEO OF KNEE  . Borderline diabetes   . BPH (benign prostatic hypertrophy)   . Coronary artery disease   . GI bleed   . Heart murmur   . Hypercholesteremia   . Hypertension   . Ulcer ([place])    PEPTIC ULCER DZ...DR. Watt Climes    Family History  Problem Relation Age of Onset  . Hypertension Father   . Dementia Mother     Past Surgical History:  Procedure  Laterality Date  . AORTIC VALVE REPLACEMENT  09/17/2012   Procedure: AORTIC VALVE REPLACEMENT (AVR);  Surgeon: Grace Isaac, MD;  Location: [facility];  Service: Open Heart Surgery;  Laterality: N/A;  . APPENDECTOMY    . CARDIAC VALVE REPLACEMENT  09-17-2012  . CORONARY ARTERY BYPASS GRAFT  09-17-2012   CABG x 3/ AVR  . CORONARY ARTERY BYPASS GRAFT  09/17/2012   Procedure: CORONARY ARTERY BYPASS GRAFTING (CABG);  Surgeon: Grace Isaac, MD;  Location: [facility];  Service: Open Heart Surgery;  Laterality: N/A;  CABG x three,  using left internal mammary artery and right leg greater saphenous vein harvested endoscopically  . INTRAOPERATIVE TRANSESOPHAGEAL ECHOCARDIOGRAM  09/17/2012   Procedure: INTRAOPERATIVE TRANSESOPHAGEAL ECHOCARDIOGRAM;  Surgeon: Grace Isaac, MD;  Location: [facility];  Service: Open Heart Surgery;  Laterality: N/A;   Social History   Occupational History  . Not on file.   Social History Main Topics  . Smoking status: Never Smoker  . Smokeless tobacco: Never Used  . Alcohol use No  . Drug use: No  . Sexual activity: Not on file

## 2016-11-18 NOTE — Patient Instructions (Addendum)
Must wear her splint at all times except for when showering. No lifting with left hand. Do not apply direct pressure to the hand.

## 2016-11-24 ENCOUNTER — Encounter: Payer: Self-pay | Admitting: Interventional Cardiology

## 2016-11-24 ENCOUNTER — Ambulatory Visit (INDEPENDENT_AMBULATORY_CARE_PROVIDER_SITE_OTHER): Payer: Medicare Other | Admitting: Interventional Cardiology

## 2016-11-24 VITALS — BP 160/70 | HR 87 | Ht 67.0 in | Wt 164.6 lb

## 2016-11-24 DIAGNOSIS — Z951 Presence of aortocoronary bypass graft: Secondary | ICD-10-CM

## 2016-11-24 DIAGNOSIS — R55 Syncope and collapse: Secondary | ICD-10-CM

## 2016-11-24 DIAGNOSIS — I10 Essential (primary) hypertension: Secondary | ICD-10-CM | POA: Diagnosis not present

## 2016-11-24 DIAGNOSIS — I480 Paroxysmal atrial fibrillation: Secondary | ICD-10-CM | POA: Diagnosis not present

## 2016-11-24 DIAGNOSIS — Z953 Presence of xenogenic heart valve: Secondary | ICD-10-CM | POA: Diagnosis not present

## 2016-11-24 NOTE — Progress Notes (Signed)
Cardiology Office Note    Date:  11/24/2016   ID:  [name], DOB 09-07-1924, MRN [id number]  PCP:  Eric Neer, MD  Cardiologist: Eric Grooms, MD   Chief Complaint  Patient presents with  . Cardiac Valve Problem    History of Present Illness:  [name] is a 83 y.o. male  who presents for aortic stenosis, coronary artery disease, bioprosthetic aortic valve, atrial fibrillation/flutter, and chronic anticoagulation therapy. Chronic left pleural effusion.  He has fallen at least twice with significant injury. He is not certain if he fell or fainted. His memory is poor. He fractured his shoulder at Thanksgiving. He fell off the porch more recently and fractured his left wrist.  He denies chest discomfort, palpitations, orthopnea, PND, and edema.  Past Medical History:  Diagnosis Date  . Aortic stenosis 05/27/12   Severe to critical  . Arthritis    OSTEO OF KNEE  . Borderline diabetes   . BPH (benign prostatic hypertrophy)   . Coronary artery disease   . GI bleed   . Heart murmur   . Hypercholesteremia   . Hypertension   . Ulcer ([name])    PEPTIC ULCER DZ...DR. Watt Climes    Past Surgical History:  Procedure Laterality Date  . AORTIC VALVE REPLACEMENT  09/17/2012   Procedure: AORTIC VALVE REPLACEMENT (AVR);  Surgeon: Grace Isaac, MD;  Location: [facility];  Service: Open Heart Surgery;  Laterality: N/A;  . APPENDECTOMY    . CARDIAC VALVE REPLACEMENT  09-17-2012  . CORONARY ARTERY BYPASS GRAFT  09-17-2012   CABG x 3/ AVR  . CORONARY ARTERY BYPASS GRAFT  09/17/2012   Procedure: CORONARY ARTERY BYPASS GRAFTING (CABG);  Surgeon: Grace Isaac, MD;  Location: [facility];  Service: Open Heart Surgery;  Laterality: N/A;  CABG x three,  using left internal mammary artery and right leg greater saphenous vein harvested endoscopically  . INTRAOPERATIVE TRANSESOPHAGEAL ECHOCARDIOGRAM  09/17/2012   Procedure: INTRAOPERATIVE TRANSESOPHAGEAL ECHOCARDIOGRAM;  Surgeon:  Grace Isaac, MD;  Location: [facility];  Service: Open Heart Surgery;  Laterality: N/A;    Current Medications: Outpatient Medications Prior to Visit  Medication Sig Dispense Refill  . ELIQUIS 2.5 MG TABS tablet TAKE 1 TABLET (2.5 MG TOTAL) BY MOUTH 2 (TWO) TIMES DAILY. 180 tablet 2  . famotidine (PEPCID) 20 MG tablet Take 20 mg by mouth daily.    . furosemide (LASIX) 20 MG tablet Take 1 tablet (20 mg total) by mouth daily. 10 tablet 0  . HYDROcodone-acetaminophen (NORCO) 5-325 MG tablet Take 1 tablet by mouth every 6 (six) hours as needed for severe pain. 20 tablet 0  . Multiple Vitamin (MULTIVITAMIN WITH MINERALS) TABS tablet Take 1 tablet by mouth daily.    . simvastatin (ZOCOR) 10 MG tablet Take 10 mg by mouth every evening.     . vitamin C (ASCORBIC ACID) 500 MG tablet Take 500 mg by mouth daily.    Marland Kitchen levothyroxine (SYNTHROID, LEVOTHROID) 50 MCG tablet Take 50 mcg by mouth daily before breakfast.    . metoprolol succinate (TOPROL-XL) 25 MG 24 hr tablet Take 0.5 tablets (12.5 mg total) by mouth daily. 45 tablet 0   No facility-administered medications prior to visit.      Allergies:   Patient has no known allergies.   Social History   Social History  . Marital status: Married    Spouse name: N/A  . Number of children: N/A  . Years of education: N/A  Social History Main Topics  . Smoking status: Never Smoker  . Smokeless tobacco: Never Used  . Alcohol use No  . Drug use: No  . Sexual activity: Not Asked   Other Topics Concern  . None   Social History Narrative  . None     Family History:  The patient's family history includes Dementia in his mother; Hypertension in his father.   ROS:   Please see the history of present illness.    Decreased memory, unsteady gait, difficulty with balance, decreasing appetite, and no palpitations or frank syncope that has been witnessed.  All other systems reviewed and are negative.   PHYSICAL EXAM:   VS:  BP (!) 160/70 (BP  Location: Right Arm)   Pulse 87   Ht 5\' 7"  (1.702 m)   Wt 164 lb 9.6 oz (74.7 kg)   BMI 25.78 kg/m    GEN: Well nourished, well developed, in no acute distress  HEENT: normal  Neck: no JVD, carotid bruits, or masses Cardiac: RRR; There is a 2/6 systolic murmur. There are no rubs, or gallops,no edema . Respiratory:  clear to auscultation bilaterally, normal work of breathing GI: soft, nontender, nondistended, + BS MS: no deformity or atrophy  Skin: warm and dry, no rash Neuro:  Alert and Oriented x 3, Strength and sensation are intact Psych: euthymic mood, full affect  Wt Readings from Last 3 Encounters:  11/24/16 164 lb 9.6 oz (74.7 kg)  07/25/16 155 lb 9.6 oz (70.6 kg)  11/22/15 176 lb 12.8 oz (80.2 kg)      Studies/Labs Reviewed:   EKG:  EKG  none. When last performed in October 2017 that was sinus rhythm with first-degree AV block.  Recent Labs: 07/23/2016: Hemoglobin 12.1; Platelets 189; TSH 10.890 07/25/2016: BUN 9; Creatinine, Ser 0.93; Potassium 4.3; Sodium 124   Lipid Panel No results found for: CHOL, TRIG, HDL, CHOLHDL, VLDL, LDLCALC, LDLDIRECT  Additional studies/ records that were reviewed today include:  No new functional data    ASSESSMENT:    1. Paroxysmal atrial fibrillation (HCC)   2. Essential hypertension   3. S/P aortic valve replacement with bioprosthetic valve   4. S/P CABG (coronary artery bypass graft)   5. Syncope, unspecified syncope type      PLAN:  In order of problems listed above:  1. No recent recurrences of atrial fibrillation. With a recent falls and question of possible syncope, a 48 hour Holter monitor is requested. He may need additional monitoring depending upon findings. 2. Adequate control. 2 g sodium diet. 3. Auscultation does not demonstrate evidence of valve dysfunction. 4. Asymptomatic with reference to angina.    Medication Adjustments/Labs and Tests Ordered: Current medicines are reviewed at length with the  patient today.  Concerns regarding medicines are outlined above.  Medication changes, Labs and Tests ordered today are listed in the Patient Instructions below. Patient Instructions  Medication Instructions:  None  Labwork: None  Testing/Procedures: Your physician has recommended that you wear a 48 hour holter monitor. Holter monitors are medical devices that record the heart's electrical activity. Doctors most often use these monitors to diagnose arrhythmias. Arrhythmias are problems with the speed or rhythm of the heartbeat. The monitor is a small, portable device. You can wear one while you do your normal daily activities. This is usually used to diagnose what is causing palpitations/syncope (passing out).    Follow-Up: Your physician wants you to follow-up in: 1 year with Dr. Tamala Julian.  You will receive a  reminder letter in the mail two months in advance. If you don't receive a letter, please call our office to schedule the follow-up appointment.   Any Other Special Instructions Will Be Listed Below (If Applicable).     If you need a refill on your cardiac medications before your next appointment, please call your pharmacy.  `1    Signed, Eric Grooms, MD  11/24/2016 1:35 PM    [facility] [place], [place], [place]  [address] Phone: [phone]; Fax: [phone]

## 2016-11-24 NOTE — Patient Instructions (Addendum)
Medication Instructions:  None  Labwork: None  Testing/Procedures: Your physician has recommended that you wear a 48 hour holter monitor. Holter monitors are medical devices that record the heart's electrical activity. Doctors most often use these monitors to diagnose arrhythmias. Arrhythmias are problems with the speed or rhythm of the heartbeat. The monitor is a small, portable device. You can wear one while you do your normal daily activities. This is usually used to diagnose what is causing palpitations/syncope (passing out).    Follow-Up: Your physician wants you to follow-up in: 1 year with Dr. Tamala Julian.  You will receive a reminder letter in the mail two months in advance. If you don't receive a letter, please call our office to schedule the follow-up appointment.   Any Other Special Instructions Will Be Listed Below (If Applicable).     If you need a refill on your cardiac medications before your next appointment, please call your pharmacy.  `1

## 2016-11-25 ENCOUNTER — Ambulatory Visit (INDEPENDENT_AMBULATORY_CARE_PROVIDER_SITE_OTHER): Payer: Medicare Other | Admitting: Orthopaedic Surgery

## 2016-11-25 ENCOUNTER — Encounter (INDEPENDENT_AMBULATORY_CARE_PROVIDER_SITE_OTHER): Payer: Self-pay | Admitting: Orthopaedic Surgery

## 2016-11-25 ENCOUNTER — Ambulatory Visit (INDEPENDENT_AMBULATORY_CARE_PROVIDER_SITE_OTHER): Payer: Medicare Other

## 2016-11-25 VITALS — BP 175/83 | HR 86 | Ht 67.0 in | Wt 164.5 lb

## 2016-11-25 DIAGNOSIS — M25532 Pain in left wrist: Secondary | ICD-10-CM

## 2016-11-25 DIAGNOSIS — M25551 Pain in right hip: Secondary | ICD-10-CM | POA: Diagnosis not present

## 2016-11-25 DIAGNOSIS — M7989 Other specified soft tissue disorders: Secondary | ICD-10-CM

## 2016-11-25 DIAGNOSIS — M799 Soft tissue disorder, unspecified: Secondary | ICD-10-CM

## 2016-11-25 NOTE — Progress Notes (Signed)
Office Visit Note   Patient: [name]           Date of Birth: 03-25-1924           MRN: [id number] Visit Date: 11/25/2016              Requested by: Mayra Neer, MD [address] [place], [place] [address] PCP: Mayra Neer, MD   Assessment & Plan: Visit Diagnoses:  1. Pain in left wrist   2. Pain in right hip   3. Soft tissue mass   3.     Left distal radius fracture  Plan: Continue wearing the splint. No lifting with left hand. We'll schedule MRI right hip to better evaluate the mass posterior hip.  Follow-up in 3 weeks for recheck and we'll repeat left wrist x-ray and Dr. Lorin Mercy will review hip MRI with him.  Follow-Up Instructions: Return in about 3 weeks (around 12/16/2016) for To review right hip MRI and repeat left wrist x-ray.   Orders:  Orders Placed This Encounter  Procedures  . XR HIP UNILAT W OR W/O PELVIS 2-3 VIEWS RIGHT  . XR Wrist Complete Left  . MR Hip Right w/o contrast   No orders of the defined types were placed in this encounter.     Procedures: No procedures performed   Clinical Data: No additional findings.   Subjective: Chief Complaint  Patient presents with  . Left Wrist - Fracture, Follow-up  . Right Hip - Pain    Patient returns for one week follow up left wrist fracture and right hip pain.  He states that the wrist feels pretty good. He is in the splint today but it is not applied appropriately. He also has a knot on the right hip that he would like looked at.   We have previously discussed the soft tissue mass right posterior hip at his last office visit. Discussed doing x-rays today. Mass continues to be bothersome when he is sitting or lying on his right side. States that he noticed this about a year ago.  Review of Systems  Constitutional: Negative.   Gastrointestinal: Negative.   Musculoskeletal: Positive for gait problem.     Objective: Vital Signs: BP (!) 175/83   Pulse 86   Ht 5\' 7"  (1.702 m)    Wt 164 lb 8 oz (74.6 kg)   BMI 25.76 kg/m   Physical Exam  Constitutional: He is oriented to person, place, and time. No distress.  HENT:  Head: Normocephalic.  Eyes: EOM are normal. Pupils are equal, round, and reactive to light.  Pulmonary/Chest: No respiratory distress.  Musculoskeletal: He exhibits tenderness (Left distal radius).  Neurological: He is alert and oriented to person, place, and time.    Ortho Exam  Specialty Comments:  No specialty comments available.  Imaging: Xr Hip Unilat W Or W/o Pelvis 2-3 Views Right  Result Date: 11/25/2016 X-ray right hip shows some degenerative changes. No signs of fracture. We did place a BB to localize soft tissue mass but this was not seen on x-ray.  Xr Wrist Complete Left  Result Date: 11/25/2016 X-ray left wrist does not show any further displacement of distal radius fracture. Maintains satisfactory    PMFS History: Patient Active Problem List   Diagnosis Date Noted  . SIADH (syndrome of inappropriate ADH production) ([place]) 07/23/2016  . Hyponatremia 07/23/2016  . Hypothyroidism 07/23/2016  . Atrial flutter, unspecified 01/16/2015  . Essential hypertension 01/16/2014  . Atrial fibrillation ([place]) 09/21/2012  .  S/P CABG (coronary artery bypass graft) 09/16/2012    Class: Chronic  . Ulcer ([place])   . BPH (benign prostatic hypertrophy)   . Arthritis   . S/P aortic valve replacement with bioprosthetic valve    Past Medical History:  Diagnosis Date  . Aortic stenosis 05/27/12   Severe to critical  . Arthritis    OSTEO OF KNEE  . Borderline diabetes   . BPH (benign prostatic hypertrophy)   . Coronary artery disease   . GI bleed   . Heart murmur   . Hypercholesteremia   . Hypertension   . Ulcer ([place])    PEPTIC ULCER DZ...DR. Watt Climes    Family History  Problem Relation Age of Onset  . Hypertension Father   . Dementia Mother     Past Surgical History:  Procedure Laterality Date  . AORTIC VALVE REPLACEMENT   09/17/2012   Procedure: AORTIC VALVE REPLACEMENT (AVR);  Surgeon: Grace Isaac, MD;  Location: [facility];  Service: Open Heart Surgery;  Laterality: N/A;  . APPENDECTOMY    . CARDIAC VALVE REPLACEMENT  09-17-2012  . CORONARY ARTERY BYPASS GRAFT  09-17-2012   CABG x 3/ AVR  . CORONARY ARTERY BYPASS GRAFT  09/17/2012   Procedure: CORONARY ARTERY BYPASS GRAFTING (CABG);  Surgeon: Grace Isaac, MD;  Location: [facility];  Service: Open Heart Surgery;  Laterality: N/A;  CABG x three,  using left internal mammary artery and right leg greater saphenous vein harvested endoscopically  . INTRAOPERATIVE TRANSESOPHAGEAL ECHOCARDIOGRAM  09/17/2012   Procedure: INTRAOPERATIVE TRANSESOPHAGEAL ECHOCARDIOGRAM;  Surgeon: Grace Isaac, MD;  Location: [facility];  Service: Open Heart Surgery;  Laterality: N/A;   Social History   Occupational History  . Not on file.   Social History Main Topics  . Smoking status: Never Smoker  . Smokeless tobacco: Never Used  . Alcohol use No  . Drug use: No  . Sexual activity: Not on file

## 2016-11-27 ENCOUNTER — Ambulatory Visit (INDEPENDENT_AMBULATORY_CARE_PROVIDER_SITE_OTHER): Payer: Medicare Other

## 2016-11-27 DIAGNOSIS — R55 Syncope and collapse: Secondary | ICD-10-CM | POA: Diagnosis not present

## 2016-11-27 NOTE — Addendum Note (Signed)
Addended by: Daylene Posey T on: 11/27/2016 11:09 AM   Modules accepted: Orders

## 2016-12-12 ENCOUNTER — Ambulatory Visit
Admission: RE | Admit: 2016-12-12 | Discharge: 2016-12-12 | Disposition: A | Discharge status: Home / Self Care | Payer: Medicare Other | Source: Ambulatory Visit | Attending: Surgery | Admitting: Surgery

## 2016-12-12 DIAGNOSIS — M25551 Pain in right hip: Secondary | ICD-10-CM | POA: Diagnosis not present

## 2016-12-12 MED ORDER — GADOBENATE DIMEGLUMINE 529 MG/ML IV SOLN
15.0000 mL | Freq: Once | INTRAVENOUS | Status: AC | PRN
  Administered 2016-12-12: 15 mL via INTRAVENOUS

## 2016-12-15 ENCOUNTER — Ambulatory Visit (INDEPENDENT_AMBULATORY_CARE_PROVIDER_SITE_OTHER): Payer: Medicare Other | Admitting: Orthopaedic Surgery

## 2016-12-15 ENCOUNTER — Encounter (INDEPENDENT_AMBULATORY_CARE_PROVIDER_SITE_OTHER): Payer: Self-pay | Admitting: Orthopaedic Surgery

## 2016-12-15 ENCOUNTER — Ambulatory Visit (INDEPENDENT_AMBULATORY_CARE_PROVIDER_SITE_OTHER): Payer: Medicare Other

## 2016-12-15 VITALS — BP 161/77 | HR 81 | Ht 67.0 in | Wt 164.0 lb

## 2016-12-15 DIAGNOSIS — S52502D Unspecified fracture of the lower end of left radius, subsequent encounter for closed fracture with routine healing: Secondary | ICD-10-CM | POA: Insufficient documentation

## 2016-12-15 NOTE — Progress Notes (Signed)
Office Visit Note   Patient: [name]           Date of Birth: [birth year]           MRN: [id number] Visit Date: 12/15/2016              Requested by: Mayra Neer, MD [address] [place], [place] [address] PCP: Mayra Neer, MD   Assessment & Plan: Visit Diagnoses:  1. Closed fracture of distal end of left radius with routine healing, unspecified fracture morphology, subsequent encounter    2. Subcutaneous lesion adjacent to the right greater trochanter consistent with nodular fasciitis versus possible atypical more aggressive lesion. It is not changed in size in the last 3-5 years.  Plan: X-rays show satisfactory healing. He can discontinue the wrist splint on 12/29/2015 and work on gentle wrist range of motion. MRI of his hip shows subcutaneous lesion which actually hasn't changed in the last 3 years as far size. We discussed options including fine-needle aspirate. Patient is here with his daughter and they're not really extending further sting. If it increases in size she can return or call.  Follow-Up Instructions: Return if symptoms worsen or fail to improve.   Orders:  Orders Placed This Encounter  Procedures  . XR Wrist 2 Views Right   No orders of the defined types were placed in this encounter.     Procedures: No procedures performed   Clinical Data: No additional findings.   Subjective: Chief Complaint  Patient presents with  . Right Hip - Pain  . Left Wrist - Fracture, Follow-up    Patient returns for follow up left wrist fracture and MRI review right hip. He states that his wrist is much better. He denies pain. He continues to wear the wrist splint. Continued swelling lateral right hip.     Review of Systems use systems updated 14 point and is unchanged from previous visit. He still wearing his wrist splint uses a cane for ambulation.   Objective: Vital Signs: BP (!) 161/77   Pulse 81   Ht 5\' 7"  (1.702 m)   Wt 164 lb  (74.4 kg)   BMI 25.69 kg/m   Physical Exam skin of the wrist is normal. He has improved range of motion without pain left shoulder present for using a cane in his right hand. Sensation of the fingertips right and left is normal.  Ortho Exam  Specialty Comments:  No specialty comments available.  Imaging: No results found.   PMFS History: Patient Active Problem List   Diagnosis Date Noted  . Closed fracture of lower end of left radius with routine healing 12/15/2016  . SIADH (syndrome of inappropriate ADH production) ([place]) 07/23/2016  . Hyponatremia 07/23/2016  . Hypothyroidism 07/23/2016  . Atrial flutter, unspecified 01/16/2015  . Essential hypertension 01/16/2014  . Atrial fibrillation ([place]) 09/21/2012  . S/P CABG (coronary artery bypass graft) 09/16/2012    Class: Chronic  . Ulcer ([place])   . BPH (benign prostatic hypertrophy)   . Arthritis   . S/P aortic valve replacement with bioprosthetic valve    Past Medical History:  Diagnosis Date  . Aortic stenosis 05/27/12   Severe to critical  . Arthritis    OSTEO OF KNEE  . Borderline diabetes   . BPH (benign prostatic hypertrophy)   . Coronary artery disease   . GI bleed   . Heart murmur   . Hypercholesteremia   . Hypertension   . Ulcer ([employer])  PEPTIC ULCER DZ...DR. Watt Climes    Family History  Problem Relation Age of Onset  . Hypertension Father   . Dementia Mother     Past Surgical History:  Procedure Laterality Date  . AORTIC VALVE REPLACEMENT  09/17/2012   Procedure: AORTIC VALVE REPLACEMENT (AVR);  Surgeon: Grace Isaac, MD;  Location: [facility];  Service: Open Heart Surgery;  Laterality: N/A;  . APPENDECTOMY    . CARDIAC VALVE REPLACEMENT  09-17-2012  . CORONARY ARTERY BYPASS GRAFT  09-17-2012   CABG x 3/ AVR  . CORONARY ARTERY BYPASS GRAFT  09/17/2012   Procedure: CORONARY ARTERY BYPASS GRAFTING (CABG);  Surgeon: Grace Isaac, MD;  Location: [facility];  Service: Open Heart Surgery;  Laterality: N/A;   CABG x three,  using left internal mammary artery and right leg greater saphenous vein harvested endoscopically  . INTRAOPERATIVE TRANSESOPHAGEAL ECHOCARDIOGRAM  09/17/2012   Procedure: INTRAOPERATIVE TRANSESOPHAGEAL ECHOCARDIOGRAM;  Surgeon: Grace Isaac, MD;  Location: [facility];  Service: Open Heart Surgery;  Laterality: N/A;   Social History   Occupational History  . Not on file.   Social History Main Topics  . Smoking status: Never Smoker  . Smokeless tobacco: Never Used  . Alcohol use No  . Drug use: No  . Sexual activity: Not on file

## 2016-12-16 ENCOUNTER — Ambulatory Visit (INDEPENDENT_AMBULATORY_CARE_PROVIDER_SITE_OTHER): Payer: Medicare Other | Admitting: Orthopaedic Surgery

## 2017-01-21 DIAGNOSIS — E782 Mixed hyperlipidemia: Secondary | ICD-10-CM | POA: Diagnosis not present

## 2017-01-21 DIAGNOSIS — I110 Hypertensive heart disease with heart failure: Secondary | ICD-10-CM | POA: Diagnosis not present

## 2017-01-21 DIAGNOSIS — E039 Hypothyroidism, unspecified: Secondary | ICD-10-CM | POA: Diagnosis not present

## 2017-01-21 DIAGNOSIS — Z952 Presence of prosthetic heart valve: Secondary | ICD-10-CM | POA: Diagnosis not present

## 2017-01-21 DIAGNOSIS — R600 Localized edema: Secondary | ICD-10-CM | POA: Diagnosis not present

## 2017-01-21 DIAGNOSIS — I482 Chronic atrial fibrillation: Secondary | ICD-10-CM | POA: Diagnosis not present

## 2017-01-21 DIAGNOSIS — R918 Other nonspecific abnormal finding of lung field: Secondary | ICD-10-CM | POA: Diagnosis not present

## 2017-01-21 DIAGNOSIS — E119 Type 2 diabetes mellitus without complications: Secondary | ICD-10-CM | POA: Diagnosis not present

## 2017-01-21 DIAGNOSIS — R270 Ataxia, unspecified: Secondary | ICD-10-CM | POA: Diagnosis not present

## 2017-01-23 ENCOUNTER — Other Ambulatory Visit: Payer: Self-pay | Admitting: Interventional Cardiology

## 2017-01-27 ENCOUNTER — Telehealth: Payer: Self-pay | Admitting: *Deleted

## 2017-01-27 DIAGNOSIS — I509 Heart failure, unspecified: Secondary | ICD-10-CM | POA: Diagnosis not present

## 2017-01-27 DIAGNOSIS — M19141 Post-traumatic osteoarthritis, right hand: Secondary | ICD-10-CM | POA: Diagnosis not present

## 2017-01-27 DIAGNOSIS — M19142 Post-traumatic osteoarthritis, left hand: Secondary | ICD-10-CM | POA: Diagnosis not present

## 2017-01-27 DIAGNOSIS — E119 Type 2 diabetes mellitus without complications: Secondary | ICD-10-CM | POA: Diagnosis not present

## 2017-01-27 DIAGNOSIS — I110 Hypertensive heart disease with heart failure: Secondary | ICD-10-CM | POA: Diagnosis not present

## 2017-01-27 DIAGNOSIS — M170 Bilateral primary osteoarthritis of knee: Secondary | ICD-10-CM | POA: Diagnosis not present

## 2017-01-27 NOTE — Telephone Encounter (Signed)
-----   Message from Belva Crome, MD sent at 01/23/2017  4:32 PM EDT ----- Regarding: RE: Eliquis dosage Remain at 2.5 mg twice a day ----- Message ----- From: Zenovia Jarred, RN Sent: 01/23/2017   4:15 PM To: Belva Crome, MD Subject: Eliquis dosage                                 Pt was started on Eliquis 2.5mg s in April 2016 after he wore an event monitor. Serum creatine at that time was 1.66, thus with his ago and Weight he was started on Eliquis 2.5mg . Now his last  serum creatine is 1.01 from 01/21/17 scan lab and all other serum creatines since 2016 have been less than 1.5. Thus, are we going to keep him on 2.5mg s or increase to 5mg  BID.  Per an office note on 03/23/2015 you did mention he has a Hx of peptic ulcer disease with bleeding. Please advise as I have to send in refill. Thank you.   Yaneth Fairbairn

## 2017-01-30 DIAGNOSIS — M19141 Post-traumatic osteoarthritis, right hand: Secondary | ICD-10-CM | POA: Diagnosis not present

## 2017-01-30 DIAGNOSIS — M170 Bilateral primary osteoarthritis of knee: Secondary | ICD-10-CM | POA: Diagnosis not present

## 2017-01-30 DIAGNOSIS — E119 Type 2 diabetes mellitus without complications: Secondary | ICD-10-CM | POA: Diagnosis not present

## 2017-01-30 DIAGNOSIS — M19142 Post-traumatic osteoarthritis, left hand: Secondary | ICD-10-CM | POA: Diagnosis not present

## 2017-01-30 DIAGNOSIS — I509 Heart failure, unspecified: Secondary | ICD-10-CM | POA: Diagnosis not present

## 2017-01-30 DIAGNOSIS — I110 Hypertensive heart disease with heart failure: Secondary | ICD-10-CM | POA: Diagnosis not present

## 2017-02-03 DIAGNOSIS — M170 Bilateral primary osteoarthritis of knee: Secondary | ICD-10-CM | POA: Diagnosis not present

## 2017-02-03 DIAGNOSIS — M19141 Post-traumatic osteoarthritis, right hand: Secondary | ICD-10-CM | POA: Diagnosis not present

## 2017-02-03 DIAGNOSIS — E119 Type 2 diabetes mellitus without complications: Secondary | ICD-10-CM | POA: Diagnosis not present

## 2017-02-03 DIAGNOSIS — I509 Heart failure, unspecified: Secondary | ICD-10-CM | POA: Diagnosis not present

## 2017-02-03 DIAGNOSIS — M19142 Post-traumatic osteoarthritis, left hand: Secondary | ICD-10-CM | POA: Diagnosis not present

## 2017-02-03 DIAGNOSIS — I110 Hypertensive heart disease with heart failure: Secondary | ICD-10-CM | POA: Diagnosis not present

## 2017-02-06 DIAGNOSIS — M170 Bilateral primary osteoarthritis of knee: Secondary | ICD-10-CM | POA: Diagnosis not present

## 2017-02-06 DIAGNOSIS — I110 Hypertensive heart disease with heart failure: Secondary | ICD-10-CM | POA: Diagnosis not present

## 2017-02-06 DIAGNOSIS — E119 Type 2 diabetes mellitus without complications: Secondary | ICD-10-CM | POA: Diagnosis not present

## 2017-02-06 DIAGNOSIS — I509 Heart failure, unspecified: Secondary | ICD-10-CM | POA: Diagnosis not present

## 2017-02-06 DIAGNOSIS — M19141 Post-traumatic osteoarthritis, right hand: Secondary | ICD-10-CM | POA: Diagnosis not present

## 2017-02-06 DIAGNOSIS — M19142 Post-traumatic osteoarthritis, left hand: Secondary | ICD-10-CM | POA: Diagnosis not present

## 2017-02-10 DIAGNOSIS — I509 Heart failure, unspecified: Secondary | ICD-10-CM | POA: Diagnosis not present

## 2017-02-10 DIAGNOSIS — M19141 Post-traumatic osteoarthritis, right hand: Secondary | ICD-10-CM | POA: Diagnosis not present

## 2017-02-10 DIAGNOSIS — I110 Hypertensive heart disease with heart failure: Secondary | ICD-10-CM | POA: Diagnosis not present

## 2017-02-10 DIAGNOSIS — E119 Type 2 diabetes mellitus without complications: Secondary | ICD-10-CM | POA: Diagnosis not present

## 2017-02-10 DIAGNOSIS — M170 Bilateral primary osteoarthritis of knee: Secondary | ICD-10-CM | POA: Diagnosis not present

## 2017-02-10 DIAGNOSIS — M19142 Post-traumatic osteoarthritis, left hand: Secondary | ICD-10-CM | POA: Diagnosis not present

## 2017-02-12 DIAGNOSIS — M19141 Post-traumatic osteoarthritis, right hand: Secondary | ICD-10-CM | POA: Diagnosis not present

## 2017-02-12 DIAGNOSIS — I509 Heart failure, unspecified: Secondary | ICD-10-CM | POA: Diagnosis not present

## 2017-02-12 DIAGNOSIS — E119 Type 2 diabetes mellitus without complications: Secondary | ICD-10-CM | POA: Diagnosis not present

## 2017-02-12 DIAGNOSIS — I110 Hypertensive heart disease with heart failure: Secondary | ICD-10-CM | POA: Diagnosis not present

## 2017-02-12 DIAGNOSIS — M19142 Post-traumatic osteoarthritis, left hand: Secondary | ICD-10-CM | POA: Diagnosis not present

## 2017-02-12 DIAGNOSIS — M170 Bilateral primary osteoarthritis of knee: Secondary | ICD-10-CM | POA: Diagnosis not present

## 2017-02-16 DIAGNOSIS — Z961 Presence of intraocular lens: Secondary | ICD-10-CM | POA: Diagnosis not present

## 2017-05-25 DIAGNOSIS — E210 Primary hyperparathyroidism: Secondary | ICD-10-CM | POA: Diagnosis not present

## 2017-05-25 DIAGNOSIS — D649 Anemia, unspecified: Secondary | ICD-10-CM | POA: Diagnosis not present

## 2017-05-25 DIAGNOSIS — I482 Chronic atrial fibrillation: Secondary | ICD-10-CM | POA: Diagnosis not present

## 2017-05-25 DIAGNOSIS — E039 Hypothyroidism, unspecified: Secondary | ICD-10-CM | POA: Diagnosis not present

## 2017-05-25 DIAGNOSIS — R911 Solitary pulmonary nodule: Secondary | ICD-10-CM | POA: Diagnosis not present

## 2017-05-25 DIAGNOSIS — Z952 Presence of prosthetic heart valve: Secondary | ICD-10-CM | POA: Diagnosis not present

## 2017-05-25 DIAGNOSIS — E1165 Type 2 diabetes mellitus with hyperglycemia: Secondary | ICD-10-CM | POA: Diagnosis not present

## 2017-05-25 DIAGNOSIS — E782 Mixed hyperlipidemia: Secondary | ICD-10-CM | POA: Diagnosis not present

## 2017-05-25 DIAGNOSIS — I5030 Unspecified diastolic (congestive) heart failure: Secondary | ICD-10-CM | POA: Diagnosis not present

## 2017-05-25 DIAGNOSIS — I110 Hypertensive heart disease with heart failure: Secondary | ICD-10-CM | POA: Diagnosis not present

## 2017-06-30 DIAGNOSIS — Z23 Encounter for immunization: Secondary | ICD-10-CM | POA: Diagnosis not present

## 2017-08-29 DIAGNOSIS — S42211A Unspecified displaced fracture of surgical neck of right humerus, initial encounter for closed fracture: Secondary | ICD-10-CM | POA: Diagnosis not present

## 2017-08-29 DIAGNOSIS — M25511 Pain in right shoulder: Secondary | ICD-10-CM | POA: Diagnosis not present

## 2017-08-29 DIAGNOSIS — W182XXA Fall in (into) shower or empty bathtub, initial encounter: Secondary | ICD-10-CM | POA: Diagnosis not present

## 2017-08-29 DIAGNOSIS — Z952 Presence of prosthetic heart valve: Secondary | ICD-10-CM | POA: Diagnosis not present

## 2017-08-29 DIAGNOSIS — I10 Essential (primary) hypertension: Secondary | ICD-10-CM | POA: Diagnosis not present

## 2017-08-29 DIAGNOSIS — S42224A 2-part nondisplaced fracture of surgical neck of right humerus, initial encounter for closed fracture: Secondary | ICD-10-CM | POA: Diagnosis not present

## 2017-08-29 DIAGNOSIS — S4991XA Unspecified injury of right shoulder and upper arm, initial encounter: Secondary | ICD-10-CM | POA: Diagnosis not present

## 2017-08-29 DIAGNOSIS — S2242XA Multiple fractures of ribs, left side, initial encounter for closed fracture: Secondary | ICD-10-CM | POA: Diagnosis not present

## 2017-08-29 DIAGNOSIS — S2232XA Fracture of one rib, left side, initial encounter for closed fracture: Secondary | ICD-10-CM | POA: Diagnosis not present

## 2017-09-04 ENCOUNTER — Encounter (INDEPENDENT_AMBULATORY_CARE_PROVIDER_SITE_OTHER): Payer: Self-pay | Admitting: Orthopaedic Surgery

## 2017-09-04 ENCOUNTER — Ambulatory Visit (INDEPENDENT_AMBULATORY_CARE_PROVIDER_SITE_OTHER): Payer: Medicare Other | Admitting: Orthopaedic Surgery

## 2017-09-04 DIAGNOSIS — S42201A Unspecified fracture of upper end of right humerus, initial encounter for closed fracture: Secondary | ICD-10-CM | POA: Insufficient documentation

## 2017-09-04 DIAGNOSIS — S42294A Other nondisplaced fracture of upper end of right humerus, initial encounter for closed fracture: Secondary | ICD-10-CM | POA: Diagnosis not present

## 2017-09-04 NOTE — Progress Notes (Signed)
Office Visit Note   Patient: [name]           Date of Birth: 02-28-1924           MRN: [id number] Visit Date: 09/04/2017              Requested by: Mayra Neer, MD [address] [place], [place] [address] PCP: Mayra Neer, MD   Assessment & Plan: Visit Diagnoses:  1. Other closed nondisplaced fracture of proximal end of right humerus, initial encounter     Plan: We will plan on treating proximal hip fracture nonoperatively.  Follow-up in 2 weeks with 2 view x-rays of the right shoulder.  Follow-Up Instructions: Return in about 2 weeks (around 09/18/2017).   Orders:  No orders of the defined types were placed in this encounter.  No orders of the defined types were placed in this encounter.     Procedures: No procedures performed   Clinical Data: No additional findings.   Subjective: Chief Complaint  Patient presents with  . Right Shoulder - Pain    Patient is a 83 year old gentleman who comes in with a non-right proximal humerus fracture from mechanical fall a week ago.  He is currently wearing a sling.  He denies any numbness and tingling.  His pain is well controlled.    Review of Systems  Constitutional: Negative.   All other systems reviewed and are negative.    Objective: Vital Signs: There were no vitals taken for this visit.  Physical Exam  Constitutional: He is oriented to person, place, and time. He appears well-developed and well-nourished.  Pulmonary/Chest: Effort normal.  Abdominal: Soft.  Neurological: He is alert and oriented to person, place, and time.  Skin: Skin is warm.  Psychiatric: He has a normal mood and affect. His behavior is normal. Judgment and thought content normal.  Nursing note and vitals reviewed.   Ortho Exam Right upper extremity shows no neurovascular compromise.  Significant bruising. Specialty Comments:  No specialty comments available.  Imaging: No results found.   PMFS  History: Patient Active Problem List   Diagnosis Date Noted  . Closed fracture of right proximal humerus 09/04/2017  . Closed fracture of lower end of left radius with routine healing 12/15/2016  . SIADH (syndrome of inappropriate ADH production) ([place]) 07/23/2016  . Hyponatremia 07/23/2016  . Hypothyroidism 07/23/2016  . Atrial flutter, unspecified 01/16/2015  . Essential hypertension 01/16/2014  . Atrial fibrillation ([place]) 09/21/2012  . S/P CABG (coronary artery bypass graft) 09/16/2012    Class: Chronic  . Ulcer   . BPH (benign prostatic hypertrophy)   . Arthritis   . S/P aortic valve replacement with bioprosthetic valve    Past Medical History:  Diagnosis Date  . Aortic stenosis 05/27/12   Severe to critical  . Arthritis    OSTEO OF KNEE  . Borderline diabetes   . BPH (benign prostatic hypertrophy)   . Coronary artery disease   . GI bleed   . Heart murmur   . Hypercholesteremia   . Hypertension   . Ulcer    PEPTIC ULCER DZ...DR. Watt Climes    Family History  Problem Relation Age of Onset  . Hypertension Father   . Dementia Mother     Past Surgical History:  Procedure Laterality Date  . AORTIC VALVE REPLACEMENT  09/17/2012   Procedure: AORTIC VALVE REPLACEMENT (AVR);  Surgeon: Grace Isaac, MD;  Location: [facility];  Service: Open Heart Surgery;  Laterality: N/A;  .  APPENDECTOMY    . CARDIAC VALVE REPLACEMENT  09-17-2012  . CORONARY ARTERY BYPASS GRAFT  09-17-2012   CABG x 3/ AVR  . CORONARY ARTERY BYPASS GRAFT  09/17/2012   Procedure: CORONARY ARTERY BYPASS GRAFTING (CABG);  Surgeon: Grace Isaac, MD;  Location: [facility];  Service: Open Heart Surgery;  Laterality: N/A;  CABG x three,  using left internal mammary artery and right leg greater saphenous vein harvested endoscopically  . INTRAOPERATIVE TRANSESOPHAGEAL ECHOCARDIOGRAM  09/17/2012   Procedure: INTRAOPERATIVE TRANSESOPHAGEAL ECHOCARDIOGRAM;  Surgeon: Grace Isaac, MD;  Location: [facility];  Service: Open  Heart Surgery;  Laterality: N/A;   Social History   Occupational History  . Not on file  Tobacco Use  . Smoking status: Never Smoker  . Smokeless tobacco: Never Used  Substance and Sexual Activity  . Alcohol use: No  . Drug use: No  . Sexual activity: Not on file

## 2017-09-09 DIAGNOSIS — R410 Disorientation, unspecified: Secondary | ICD-10-CM | POA: Diagnosis not present

## 2017-09-18 ENCOUNTER — Ambulatory Visit (INDEPENDENT_AMBULATORY_CARE_PROVIDER_SITE_OTHER): Payer: Medicare Other | Admitting: Orthopaedic Surgery

## 2017-09-18 ENCOUNTER — Encounter (INDEPENDENT_AMBULATORY_CARE_PROVIDER_SITE_OTHER): Payer: Self-pay | Admitting: Orthopaedic Surgery

## 2017-09-18 ENCOUNTER — Ambulatory Visit (INDEPENDENT_AMBULATORY_CARE_PROVIDER_SITE_OTHER): Payer: Medicare Other

## 2017-09-18 DIAGNOSIS — S42294D Other nondisplaced fracture of upper end of right humerus, subsequent encounter for fracture with routine healing: Secondary | ICD-10-CM

## 2017-09-18 NOTE — Progress Notes (Signed)
Patient is two-week status post nondisplaced proximal humerus fracture.  He is overall doing better.  He is wearing a sling today.  X-rays show stable alignment with evidence of early callus formation.  At this point may begin pendulum exercises for the next 3 weeks.  Follow-up at that time for repeat 2 view x-rays of the right shoulder and initiation of physical therapy.

## 2017-10-08 DIAGNOSIS — E210 Primary hyperparathyroidism: Secondary | ICD-10-CM | POA: Diagnosis not present

## 2017-10-08 DIAGNOSIS — E782 Mixed hyperlipidemia: Secondary | ICD-10-CM | POA: Diagnosis not present

## 2017-10-08 DIAGNOSIS — I482 Chronic atrial fibrillation: Secondary | ICD-10-CM | POA: Diagnosis not present

## 2017-10-08 DIAGNOSIS — I5030 Unspecified diastolic (congestive) heart failure: Secondary | ICD-10-CM | POA: Diagnosis not present

## 2017-10-08 DIAGNOSIS — I110 Hypertensive heart disease with heart failure: Secondary | ICD-10-CM | POA: Diagnosis not present

## 2017-10-08 DIAGNOSIS — E1122 Type 2 diabetes mellitus with diabetic chronic kidney disease: Secondary | ICD-10-CM | POA: Diagnosis not present

## 2017-10-08 DIAGNOSIS — K219 Gastro-esophageal reflux disease without esophagitis: Secondary | ICD-10-CM | POA: Diagnosis not present

## 2017-10-08 DIAGNOSIS — Z0000 Encounter for general adult medical examination without abnormal findings: Secondary | ICD-10-CM | POA: Diagnosis not present

## 2017-10-08 DIAGNOSIS — D649 Anemia, unspecified: Secondary | ICD-10-CM | POA: Diagnosis not present

## 2017-10-08 DIAGNOSIS — R918 Other nonspecific abnormal finding of lung field: Secondary | ICD-10-CM | POA: Diagnosis not present

## 2017-10-08 DIAGNOSIS — E039 Hypothyroidism, unspecified: Secondary | ICD-10-CM | POA: Diagnosis not present

## 2017-10-08 DIAGNOSIS — N182 Chronic kidney disease, stage 2 (mild): Secondary | ICD-10-CM | POA: Diagnosis not present

## 2017-10-13 ENCOUNTER — Encounter (INDEPENDENT_AMBULATORY_CARE_PROVIDER_SITE_OTHER): Payer: Self-pay | Admitting: Orthopaedic Surgery

## 2017-10-13 ENCOUNTER — Ambulatory Visit (INDEPENDENT_AMBULATORY_CARE_PROVIDER_SITE_OTHER): Payer: Medicare Other

## 2017-10-13 ENCOUNTER — Ambulatory Visit (INDEPENDENT_AMBULATORY_CARE_PROVIDER_SITE_OTHER): Payer: Medicare Other | Admitting: Orthopaedic Surgery

## 2017-10-13 DIAGNOSIS — S42294D Other nondisplaced fracture of upper end of right humerus, subsequent encounter for fracture with routine healing: Secondary | ICD-10-CM | POA: Diagnosis not present

## 2017-10-13 NOTE — Progress Notes (Signed)
Patient is 6 weeks status post nondisplaced proximal humerus fracture.  He is doing much better today.  His range of motion is excellent and without any pain.  He is able to actively elevate his arm to above his head without any significant pain.  X-rays show evidence of healing of the fracture with acceptable alignment.  At this point we will set him up with home health physical therapy for range of motion and strengthening exercises.  Discontinue sling.  Follow-up as needed.  Questions encouraged and answered.

## 2017-10-15 DIAGNOSIS — Z7901 Long term (current) use of anticoagulants: Secondary | ICD-10-CM | POA: Diagnosis not present

## 2017-10-15 DIAGNOSIS — S42201D Unspecified fracture of upper end of right humerus, subsequent encounter for fracture with routine healing: Secondary | ICD-10-CM | POA: Diagnosis not present

## 2017-10-16 DIAGNOSIS — E876 Hypokalemia: Secondary | ICD-10-CM | POA: Diagnosis not present

## 2017-10-20 DIAGNOSIS — S42201D Unspecified fracture of upper end of right humerus, subsequent encounter for fracture with routine healing: Secondary | ICD-10-CM | POA: Diagnosis not present

## 2017-10-20 DIAGNOSIS — Z7901 Long term (current) use of anticoagulants: Secondary | ICD-10-CM | POA: Diagnosis not present

## 2017-10-21 ENCOUNTER — Telehealth (INDEPENDENT_AMBULATORY_CARE_PROVIDER_SITE_OTHER): Payer: Self-pay | Admitting: Orthopaedic Surgery

## 2017-10-21 NOTE — Telephone Encounter (Signed)
See message below °

## 2017-10-21 NOTE — Telephone Encounter (Signed)
Harrell Gave called from [facility] requesting occupational therapy evaluation. CB # [phone]

## 2017-10-21 NOTE — Telephone Encounter (Signed)
Called [name]  Back to advise on message, [facility]  Ok on order per Dr Erlinda Hong

## 2017-10-21 NOTE — Telephone Encounter (Signed)
yes

## 2017-10-22 DIAGNOSIS — S42201D Unspecified fracture of upper end of right humerus, subsequent encounter for fracture with routine healing: Secondary | ICD-10-CM | POA: Diagnosis not present

## 2017-10-22 DIAGNOSIS — Z7901 Long term (current) use of anticoagulants: Secondary | ICD-10-CM | POA: Diagnosis not present

## 2017-10-27 DIAGNOSIS — S42201D Unspecified fracture of upper end of right humerus, subsequent encounter for fracture with routine healing: Secondary | ICD-10-CM | POA: Diagnosis not present

## 2017-10-27 DIAGNOSIS — Z7901 Long term (current) use of anticoagulants: Secondary | ICD-10-CM | POA: Diagnosis not present

## 2017-10-29 DIAGNOSIS — Z7901 Long term (current) use of anticoagulants: Secondary | ICD-10-CM | POA: Diagnosis not present

## 2017-10-29 DIAGNOSIS — S42201D Unspecified fracture of upper end of right humerus, subsequent encounter for fracture with routine healing: Secondary | ICD-10-CM | POA: Diagnosis not present

## 2017-11-03 DIAGNOSIS — Z7901 Long term (current) use of anticoagulants: Secondary | ICD-10-CM | POA: Diagnosis not present

## 2017-11-03 DIAGNOSIS — S42201D Unspecified fracture of upper end of right humerus, subsequent encounter for fracture with routine healing: Secondary | ICD-10-CM | POA: Diagnosis not present

## 2017-11-05 DIAGNOSIS — S42201D Unspecified fracture of upper end of right humerus, subsequent encounter for fracture with routine healing: Secondary | ICD-10-CM | POA: Diagnosis not present

## 2017-11-05 DIAGNOSIS — Z7901 Long term (current) use of anticoagulants: Secondary | ICD-10-CM | POA: Diagnosis not present

## 2017-11-10 DIAGNOSIS — Z7901 Long term (current) use of anticoagulants: Secondary | ICD-10-CM | POA: Diagnosis not present

## 2017-11-10 DIAGNOSIS — S42201D Unspecified fracture of upper end of right humerus, subsequent encounter for fracture with routine healing: Secondary | ICD-10-CM | POA: Diagnosis not present

## 2017-11-12 DIAGNOSIS — Z7901 Long term (current) use of anticoagulants: Secondary | ICD-10-CM | POA: Diagnosis not present

## 2017-11-12 DIAGNOSIS — S42201D Unspecified fracture of upper end of right humerus, subsequent encounter for fracture with routine healing: Secondary | ICD-10-CM | POA: Diagnosis not present

## 2017-11-30 ENCOUNTER — Ambulatory Visit: Payer: Medicare Other | Admitting: Interventional Cardiology

## 2018-02-04 ENCOUNTER — Other Ambulatory Visit: Payer: Self-pay | Admitting: Interventional Cardiology

## 2018-02-05 DIAGNOSIS — E782 Mixed hyperlipidemia: Secondary | ICD-10-CM | POA: Diagnosis not present

## 2018-02-05 DIAGNOSIS — R5383 Other fatigue: Secondary | ICD-10-CM | POA: Diagnosis not present

## 2018-02-05 DIAGNOSIS — D649 Anemia, unspecified: Secondary | ICD-10-CM | POA: Diagnosis not present

## 2018-02-05 DIAGNOSIS — I5030 Unspecified diastolic (congestive) heart failure: Secondary | ICD-10-CM | POA: Diagnosis not present

## 2018-02-05 DIAGNOSIS — I110 Hypertensive heart disease with heart failure: Secondary | ICD-10-CM | POA: Diagnosis not present

## 2018-02-05 DIAGNOSIS — E1122 Type 2 diabetes mellitus with diabetic chronic kidney disease: Secondary | ICD-10-CM | POA: Diagnosis not present

## 2018-02-05 DIAGNOSIS — N182 Chronic kidney disease, stage 2 (mild): Secondary | ICD-10-CM | POA: Diagnosis not present

## 2018-02-05 DIAGNOSIS — I482 Chronic atrial fibrillation: Secondary | ICD-10-CM | POA: Diagnosis not present

## 2018-02-05 DIAGNOSIS — E039 Hypothyroidism, unspecified: Secondary | ICD-10-CM | POA: Diagnosis not present

## 2018-02-05 NOTE — Telephone Encounter (Signed)
Eliquis refill request received; pt is 84 yrs old, wt- 74.4kg, Crea-0.96 via [facility] at PCP office on 10/08/17, last seen by Dr. Tamala Julian on 11/24/16 and has an app scheduled on 02/09/18 with Dr. Tamala Julian; per med management message in the chart on 01/27/17 between [place], [place] and Dr. Tamala Julian pt will remain on Eliquis 2.5mg  twice a day. Will send in a a refill to mail order pharmacy as requested at his time as pt has an appt on 02/09/18.

## 2018-02-08 NOTE — Progress Notes (Signed)
Cardiology Office Note    Date:  02/09/2018   ID:  [name], DOB 04-28-1924, MRN [id number]  PCP:  Mayra Neer, MD  Cardiologist: Sinclair Grooms, MD   Chief Complaint  Patient presents with  . Cardiac Valve Problem  . Coronary Artery Disease  . Chest Pain    History of Present Illness:  [name] is a 84 y.o. male  who presents for aortic stenosis, coronary artery disease, bioprosthetic aortic valve, atrial fibrillation/flutter, and chronic anticoagulation therapy. Chronic left pleural effusion.  He overall notices increasing fatigue.  He fell in November and fractured the right arm and shoulder.  A year prior [place] the [place] and fractured his left shoulder.  He sleeps a lot.  He gets up multiple times at night to urinate.  He denies orthopnea.  He has had no episodes of syncope.  He denies palpitations.  He denies angina.  He denies peripheral edema.  No specific medication side effects are noted.   Past Medical History:  Diagnosis Date  . Aortic stenosis 05/27/12   Severe to critical  . Arthritis    OSTEO OF KNEE  . Borderline diabetes   . BPH (benign prostatic hypertrophy)   . Coronary artery disease   . GI bleed   . Heart murmur   . Hypercholesteremia   . Hypertension   . Ulcer    PEPTIC ULCER DZ...DR. Watt Climes    Past Surgical History:  Procedure Laterality Date  . AORTIC VALVE REPLACEMENT  09/17/2012   Procedure: AORTIC VALVE REPLACEMENT (AVR);  Surgeon: Grace Isaac, MD;  Location: [facility];  Service: Open Heart Surgery;  Laterality: N/A;  . APPENDECTOMY    . CARDIAC VALVE REPLACEMENT  09-17-2012  . CORONARY ARTERY BYPASS GRAFT  09-17-2012   CABG x 3/ AVR  . CORONARY ARTERY BYPASS GRAFT  09/17/2012   Procedure: CORONARY ARTERY BYPASS GRAFTING (CABG);  Surgeon: Grace Isaac, MD;  Location: [facility];  Service: Open Heart Surgery;  Laterality: N/A;  CABG x three,  using left internal mammary artery and right leg  greater saphenous vein harvested endoscopically  . INTRAOPERATIVE TRANSESOPHAGEAL ECHOCARDIOGRAM  09/17/2012   Procedure: INTRAOPERATIVE TRANSESOPHAGEAL ECHOCARDIOGRAM;  Surgeon: Grace Isaac, MD;  Location: [facility];  Service: Open Heart Surgery;  Laterality: N/A;    Current Medications: Outpatient Medications Prior to Visit  Medication Sig Dispense Refill  . ELIQUIS 2.5 MG TABS tablet TAKE 1 TABLET TWICE DAILY 180 tablet 0  . famotidine (PEPCID) 20 MG tablet Take 20 mg by mouth daily.    Marland Kitchen HYDROcodone-acetaminophen (NORCO) 5-325 MG tablet Take 1 tablet by mouth every 6 (six) hours as needed for severe pain. 20 tablet 0  . levothyroxine (SYNTHROID, LEVOTHROID) 100 MCG tablet Take 1,000 mcg by mouth daily before breakfast.    . metoprolol succinate (TOPROL-XL) 25 MG 24 hr tablet Take 25 mg by mouth daily.    . Multiple Vitamin (MULTIVITAMIN WITH MINERALS) TABS tablet Take 1 tablet by mouth daily.    . simvastatin (ZOCOR) 10 MG tablet Take 10 mg by mouth every evening.     . vitamin C (ASCORBIC ACID) 500 MG tablet Take 500 mg by mouth daily.    . furosemide (LASIX) 20 MG tablet Take 1 tablet (20 mg total) by mouth daily. (Patient not taking: Reported on 02/09/2018) 10 tablet 0   No facility-administered medications prior to visit.      Allergies:  Patient has no known allergies.   Social History   Socioeconomic History  . Marital status: Married    Spouse name: Not on file  . Number of children: Not on file  . Years of education: Not on file  . Highest education level: Not on file  Occupational History  . Not on file  Social Needs  . Financial resource strain: Not on file  . Food insecurity:    Worry: Not on file    Inability: Not on file  . Transportation needs:    Medical: Not on file    Non-medical: Not on file  Tobacco Use  . Smoking status: Never Smoker  . Smokeless tobacco: Never Used  Substance and Sexual Activity  . Alcohol use: No  . Drug use: No  . Sexual  activity: Not on file  Lifestyle  . Physical activity:    Days per week: Not on file    Minutes per session: Not on file  . Stress: Not on file  Relationships  . Social connections:    Talks on phone: Not on file    Gets together: Not on file    Attends religious service: Not on file    Active member of club or organization: Not on file    Attends meetings of clubs or organizations: Not on file    Relationship status: Not on file  Other Topics Concern  . Not on file  Social History Narrative  . Not on file     Family History:  The patient's family history includes Dementia in his mother; Hypertension in his father.   ROS:   Please see the history of present illness.    Hearing loss, cough, difficulty with balance, snoring, and tingling in the fingers on the right hand after the fracture in November 2018. All other systems reviewed and are negative.   PHYSICAL EXAM:   VS:  BP (!) 174/82   Pulse 82   Ht 5' 7.5" (1.715 m)   Wt 164 lb 3.2 oz (74.5 kg)   BMI 25.34 kg/m    GEN: Well nourished, well developed, in no acute distress  HEENT: normal  Neck: no JVD, carotid bruits, or masses Cardiac: IRR; no murmurs, rubs, or gallops,no edema  Respiratory:  clear to auscultation bilaterally, normal work of breathing GI: soft, nontender, nondistended, + BS MS: no deformity or atrophy  Skin: warm and dry, no rash Neuro:  Alert and Oriented x 3, Strength and sensation are intact Psych: euthymic mood, full affect  Wt Readings from Last 3 Encounters:  02/09/18 164 lb 3.2 oz (74.5 kg)  12/15/16 164 lb (74.4 kg)  11/25/16 164 lb 8 oz (74.6 kg)      Studies/Labs Reviewed:   EKG:  EKG sinus rhythm, first-degree AV block, right bundle branch block, right axis deviation with possible left posterior hemiblock.  When compared to the prior most recent tracing from 2017, the right bundle branch block is new.  Recent Labs: No results found for requested labs within last 8760 hours.    Lipid Panel No results found for: CHOL, TRIG, HDL, CHOLHDL, VLDL, LDLCALC, LDLDIRECT  Additional studies/ records that were reviewed today include:  No recent imaging.  48-hour Holter monitor February 2018: Study Highlights     NSR with first degree AV block  Occasional Mobitz 1 second degree AV block   NSR with 1 decree AV block No atrial fibrillation.      ASSESSMENT:    1. S/P CABG (coronary  artery bypass graft)   2. S/P aortic valve replacement with bioprosthetic valve   3. Essential hypertension   4. Atypical atrial flutter (HCC)      PLAN:  In order of problems listed above:  1. Stable without angina. 2. Clinically normal function of the prosthetic aortic tissue valve. 3. Borderline blood pressure elevation. 4. No recurrence of atrial flutter. 5. Progression of conduction abnormality on EKG now with bifascicular block with new diagnosis of right bundle  You conservative clinical follow-up in this 84 year old gentleman who is doing relatively well but has noticeable reduction in exertional tolerance and increased daytime somnolence.  Possible his sleep apnea is contributing.  Plan at this point is to follow-up in 1 year.  No change in therapy.  Medication Adjustments/Labs and Tests Ordered: Current medicines are reviewed at length with the patient today.  Concerns regarding medicines are outlined above.  Medication changes, Labs and Tests ordered today are listed in the Patient Instructions below. There are no Patient Instructions on file for this visit.   Signed, Sinclair Grooms, MD  02/09/2018 11:28 AM    [facility] [place] [address], [place], [place]  [address] Phone: [phone]; Fax: [phone]

## 2018-02-09 ENCOUNTER — Ambulatory Visit (INDEPENDENT_AMBULATORY_CARE_PROVIDER_SITE_OTHER): Payer: Medicare Other | Admitting: Interventional Cardiology

## 2018-02-09 ENCOUNTER — Encounter: Payer: Self-pay | Admitting: Interventional Cardiology

## 2018-02-09 VITALS — BP 174/82 | HR 82 | Ht 67.5 in | Wt 164.2 lb

## 2018-02-09 DIAGNOSIS — I484 Atypical atrial flutter: Secondary | ICD-10-CM

## 2018-02-09 DIAGNOSIS — I452 Bifascicular block: Secondary | ICD-10-CM | POA: Insufficient documentation

## 2018-02-09 DIAGNOSIS — Z951 Presence of aortocoronary bypass graft: Secondary | ICD-10-CM | POA: Diagnosis not present

## 2018-02-09 DIAGNOSIS — Z953 Presence of xenogenic heart valve: Secondary | ICD-10-CM

## 2018-02-09 DIAGNOSIS — I10 Essential (primary) hypertension: Secondary | ICD-10-CM

## 2018-02-09 HISTORY — DX: Bifascicular block: I45.2

## 2018-02-09 NOTE — Patient Instructions (Signed)

## 2018-02-18 DIAGNOSIS — Z961 Presence of intraocular lens: Secondary | ICD-10-CM | POA: Diagnosis not present

## 2018-04-28 ENCOUNTER — Other Ambulatory Visit: Payer: Self-pay | Admitting: Interventional Cardiology

## 2018-07-19 DIAGNOSIS — Z23 Encounter for immunization: Secondary | ICD-10-CM | POA: Diagnosis not present

## 2018-08-03 ENCOUNTER — Other Ambulatory Visit: Payer: Self-pay | Admitting: Interventional Cardiology

## 2018-08-03 NOTE — Telephone Encounter (Signed)
Eliquis 2.5mg  refill request received; pt is 84 yrs old, wt-74.5kg, Crea-0.96 on 10/08/2017 via PCP Dr. Brigitte Pulse and labs are scanned in system. Per dosing criteria pt is on incorrect dose, however, a message was sent to Dr. Tamala Julian on 01/27/17 regarding this and included the pt's hx of peptic ulcer disease with bleeding on 01/27/17 and he stated that the pt should remain on Eliquis 2.5mg ; therefore, will send in refill to requested pharmacy.

## 2018-08-21 IMAGING — CR DG CHEST 2V
2 series · 2 of 2 positions shown · non-contrast
Comparison: Chest radiograph performed 02/15/2013

CLINICAL DATA: Chronic extreme fatigue.  Initial encounter.

EXAM:
CHEST  2 VIEW

[chest lat]
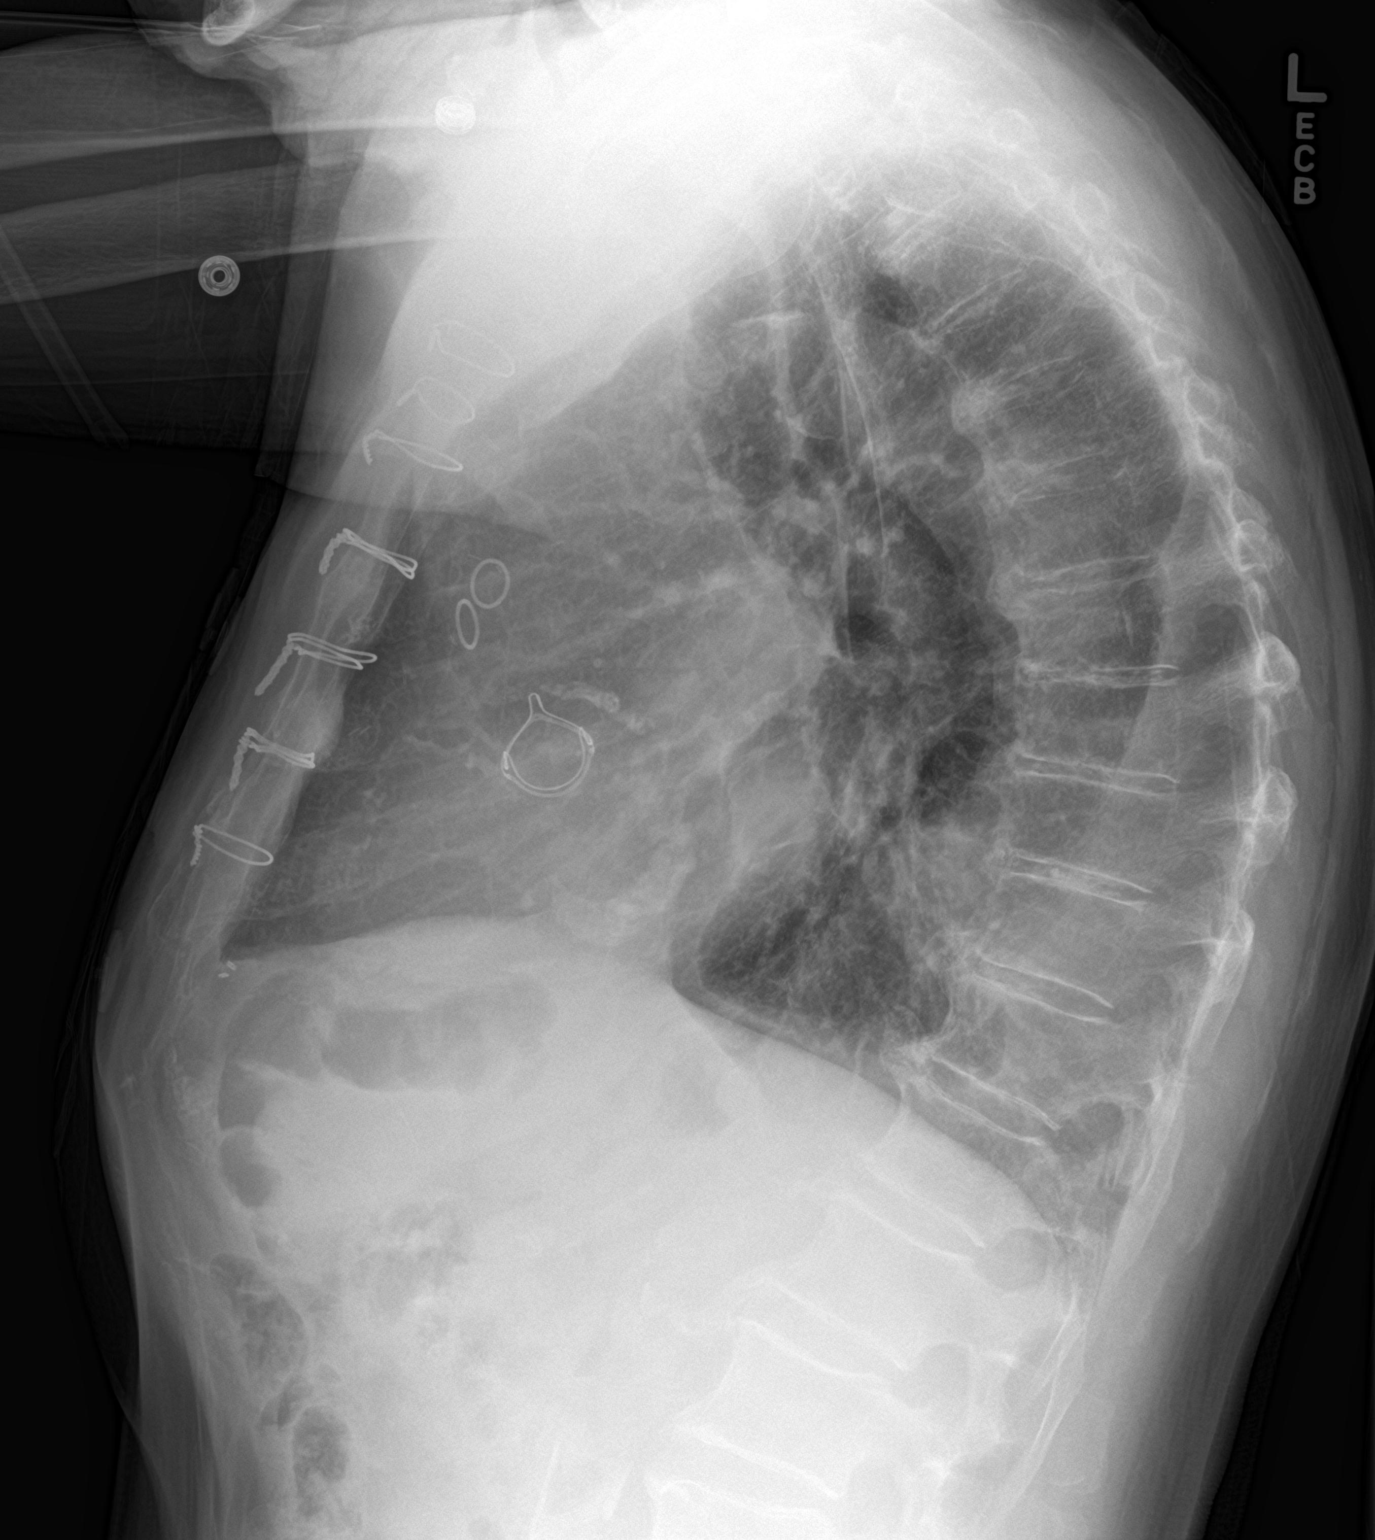

[chest ap]
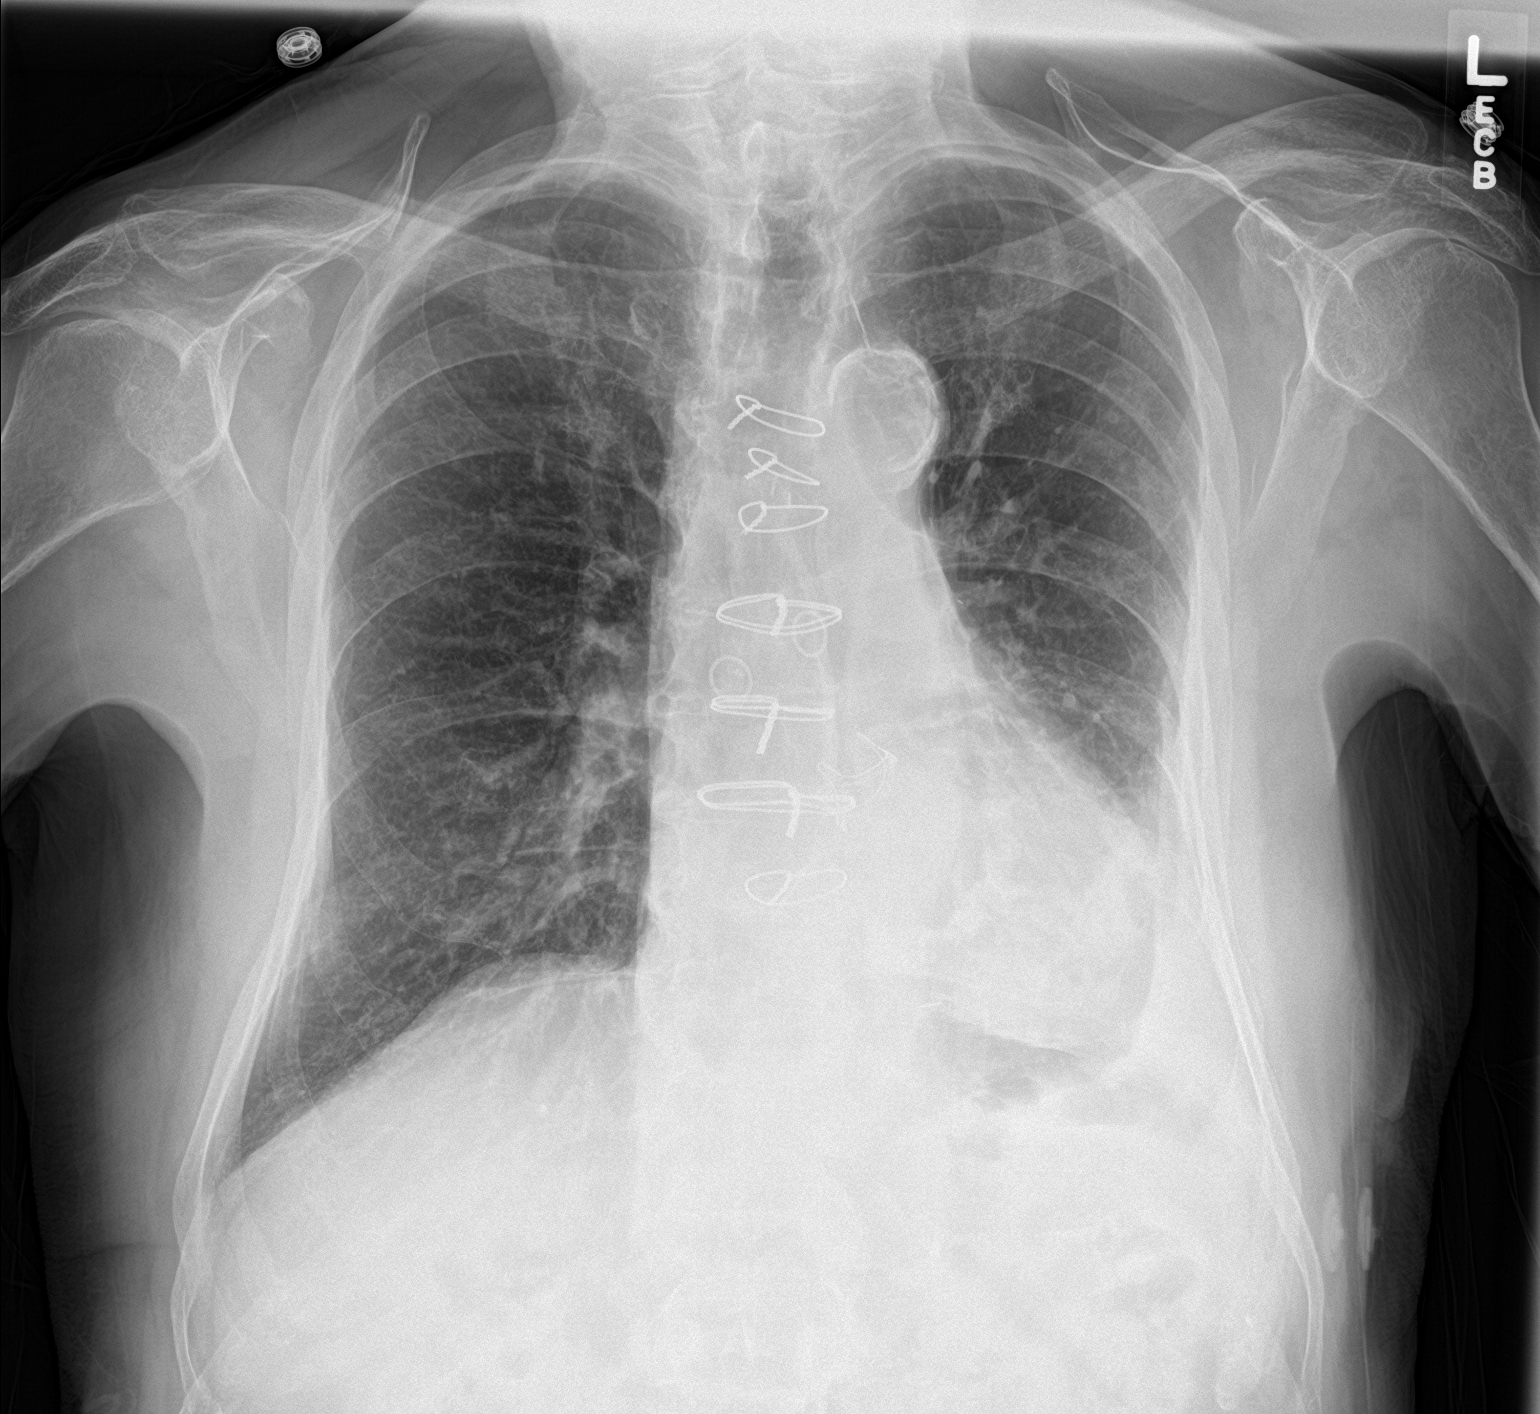

[2 of 2 positions shown; findings below may reference images not displayed]

FINDINGS: A small left pleural effusion is noted. Left basilar airspace
opacity raises concern for pneumonia. No pneumothorax is seen.

The lungs are hyperexpanded, with flattening of the hemidiaphragms,
compatible with COPD.

The cardiomediastinal silhouette is borderline normal in size. The
patient is status post median sternotomy, with evidence of prior
CABG. An aortic valve replacement is noted. No acute osseous
abnormalities are seen.
IMPRESSION: 1. Small left pleural effusion noted. Left basilar airspace opacity
raises concern for pneumonia. Followup PA and lateral chest X-ray is
recommended in 3-4 weeks following trial of antibiotic therapy to
ensure resolution and exclude underlying malignancy.
2. Findings of COPD.

## 2018-10-12 DIAGNOSIS — N182 Chronic kidney disease, stage 2 (mild): Secondary | ICD-10-CM | POA: Diagnosis not present

## 2018-10-12 DIAGNOSIS — Z0000 Encounter for general adult medical examination without abnormal findings: Secondary | ICD-10-CM | POA: Diagnosis not present

## 2018-10-12 DIAGNOSIS — J069 Acute upper respiratory infection, unspecified: Secondary | ICD-10-CM | POA: Diagnosis not present

## 2018-10-12 DIAGNOSIS — E039 Hypothyroidism, unspecified: Secondary | ICD-10-CM | POA: Diagnosis not present

## 2018-10-12 DIAGNOSIS — I482 Chronic atrial fibrillation, unspecified: Secondary | ICD-10-CM | POA: Diagnosis not present

## 2018-10-12 DIAGNOSIS — I5030 Unspecified diastolic (congestive) heart failure: Secondary | ICD-10-CM | POA: Diagnosis not present

## 2018-10-12 DIAGNOSIS — E1122 Type 2 diabetes mellitus with diabetic chronic kidney disease: Secondary | ICD-10-CM | POA: Diagnosis not present

## 2018-10-12 DIAGNOSIS — K219 Gastro-esophageal reflux disease without esophagitis: Secondary | ICD-10-CM | POA: Diagnosis not present

## 2018-10-12 DIAGNOSIS — I110 Hypertensive heart disease with heart failure: Secondary | ICD-10-CM | POA: Diagnosis not present

## 2018-10-12 DIAGNOSIS — E210 Primary hyperparathyroidism: Secondary | ICD-10-CM | POA: Diagnosis not present

## 2018-10-12 DIAGNOSIS — R918 Other nonspecific abnormal finding of lung field: Secondary | ICD-10-CM | POA: Diagnosis not present

## 2018-10-12 DIAGNOSIS — E782 Mixed hyperlipidemia: Secondary | ICD-10-CM | POA: Diagnosis not present

## 2018-11-03 ENCOUNTER — Other Ambulatory Visit: Payer: Self-pay | Admitting: Interventional Cardiology

## 2018-11-03 MED ORDER — APIXABAN 2.5 MG PO TABS: 2.5000 mg | ORAL_TABLET | Freq: Two times a day (BID) | ORAL | 1 refills | Status: DC

## 2018-11-03 NOTE — Telephone Encounter (Signed)
Eliquis 2.5mg  refill request received; pt is 85 yrs old, wt-74.5kg, SCr 1.02 on 10/12/18 in [place].  Per dosing criteria pt is on incorrect dose, however, a message was sent to Dr. Tamala Julian on 01/27/17 regarding this and included the pt's hx of peptic ulcer disease with bleeding on 01/27/17 and he stated that the pt should remain on Eliquis 2.5mg ; therefore, will send in refill to requested pharmacy.

## 2018-11-03 NOTE — Telephone Encounter (Signed)
 *  STAT* If patient is at the pharmacy, call can be transferred to refill team.   1. Which medications need to be refilled? (please list name of each medication and dose if known) ELIQUIS 2.5 MG TABS tablet  2. Which pharmacy/location (including street and city if local pharmacy) is medication to be sent to? [facility] Order Phone  [phone]  3. Do they need a 30 day or 90 day supply? 90  Patient has 10 day supply left

## 2019-02-04 ENCOUNTER — Telehealth: Payer: Self-pay | Admitting: Interventional Cardiology

## 2019-02-04 NOTE — Telephone Encounter (Signed)
Patient set up for MyChart?  YES BUT GAVE VERBAL CONSENT INSTEAD  Is patient using Smartphone/computer/tablet? YES  Did audio/video work?  Does patient need telephone visit? YES   Best phone number to use? [phone]  Special Instructions?  PATIENT WIFE SAID DAUGHTER GETS PTS VITALS , SHE WILL DO OVER THE WEEKEND AS SHE CAN NOT BE THERE THE DAY OF APPT     Virtual Visit Pre-Appointment Phone Call  "(Name), I am calling you today to discuss your upcoming appointment. We are currently trying to limit exposure to the virus that causes COVID-19 by seeing patients at home rather than in the office."  1. "What is the BEST phone number to call the day of the visit?" - include this in appointment notes  2. Do you have or have access to (through a family member/friend) a smartphone with video capability that we can use for your visit?" a. If yes - list this number in appt notes as cell (if different from BEST phone #) and list the appointment type as a VIDEO visit in appointment notes b. If no - list the appointment type as a PHONE visit in appointment notes  3. Confirm consent - "In the setting of the current Covid19 crisis, you are scheduled for a (phone or video) visit with your provider on (date) at (time).  Just as we do with many in-office visits, in order for you to participate in this visit, we must obtain consent.  If you'd like, I can send this to your mychart (if signed up) or email for you to review.  Otherwise, I can obtain your verbal consent now.  All virtual visits are billed to your insurance company just like a normal visit would be.  By agreeing to a virtual visit, we'd like you to understand that the technology does not allow for your provider to perform an examination, and thus may limit your provider's ability to fully assess your condition. If your provider identifies any concerns that need to be evaluated in person, we will make arrangements to do so.  Finally, though the  technology is pretty good, we cannot assure that it will always work on either your or our end, and in the setting of a video visit, we may have to convert it to a phone-only visit.  In either situation, we cannot ensure that we have a secure connection.  Are you willing to proceed?" STAFF: Did the patient verbally acknowledge consent to telehealth visit? Document YES/NO here: YES PER WIFE  4. Advise patient to be prepared - "Two hours prior to your appointment, go ahead and check your blood pressure, pulse, oxygen saturation, and your weight (if you have the equipment to check those) and write them all down. When your visit starts, your provider will ask you for this information. If you have an Apple Watch or Kardia device, please plan to have heart rate information ready on the day of your appointment. Please have a pen and paper handy nearby the day of the visit as well."  5. Give patient instructions for MyChart download to smartphone OR Doximity/Doxy.me as below if video visit (depending on what platform provider is using)  6. Inform patient they will receive a phone call 15 minutes prior to their appointment time (may be from unknown caller ID) so they should be prepared to answer    TELEPHONE CALL NOTE  [name] has been deemed a candidate for a follow-up tele-health visit to limit community exposure during the  Covid-19 pandemic. I spoke with the patient via phone to ensure availability of phone/video source, confirm preferred email & phone number, and discuss instructions and expectations.  I reminded [name] to be prepared with any vital sign and/or heart rhythm information that could potentially be obtained via home monitoring, at the time of his visit. I reminded [name] to expect a phone call prior to his visit.  [name] 02/04/2019 11:19 AM   INSTRUCTIONS FOR DOWNLOADING THE MYCHART APP TO SMARTPHONE  - The patient must first make sure to have activated  MyChart and know their login information - If Apple, go to [employer] and type in MyChart in the search bar and download the app. If Android, ask patient to go to [employer] and type in [place] in the search bar and download the app. The app is free but as with any other app downloads, their phone may require them to verify saved payment information or Apple/Android password.  - The patient will need to then log into the app with their MyChart username and password, and select [facility] as their healthcare provider to link the account. When it is time for your visit, go to the MyChart app, find appointments, and click Begin Video Visit. Be sure to Select Allow for your device to access the Microphone and Camera for your visit. You will then be connected, and your provider will be with you shortly.  **If they have any issues connecting, or need assistance please contact MyChart service desk [phone])**  **If using a computer, in order to ensure the best quality for their visit they will need to use either of the following Internet Browsers: [employer], or Google Chrome**  IF USING DOXIMITY or DOXY.ME - The patient will receive a link just prior to their visit by text.     FULL LENGTH CONSENT FOR TELE-HEALTH VISIT   I hereby voluntarily request, consent and authorize [facility] and its employed or contracted physicians, physician assistants, nurse practitioners or other licensed health care professionals (the Practitioner), to provide me with telemedicine health care services (the Services") as deemed necessary by the treating Practitioner. I acknowledge and consent to receive the Services by the Practitioner via telemedicine. I understand that the telemedicine visit will involve communicating with the Practitioner through live audiovisual communication technology and the disclosure of certain medical information by electronic transmission. I acknowledge that I have  been given the opportunity to request an in-person assessment or other available alternative prior to the telemedicine visit and am voluntarily participating in the telemedicine visit.  I understand that I have the right to withhold or withdraw my consent to the use of telemedicine in the course of my care at any time, without affecting my right to future care or treatment, and that the Practitioner or I may terminate the telemedicine visit at any time. I understand that I have the right to inspect all information obtained and/or recorded in the course of the telemedicine visit and may receive copies of available information for a reasonable fee.  I understand that some of the potential risks of receiving the Services via telemedicine include:   Delay or interruption in medical evaluation due to technological equipment failure or disruption;  Information transmitted may not be sufficient (e.g. poor resolution of images) to allow for appropriate medical decision making by the Practitioner; and/or   In rare instances, security protocols could fail, causing a breach of personal health information.  Furthermore, I acknowledge that it is my responsibility to provide information about my medical history, conditions and care that is complete and accurate to the best of my ability. I acknowledge that Practitioner's advice, recommendations, and/or decision may be based on factors not within their control, such as incomplete or inaccurate data provided by me or distortions of diagnostic images or specimens that may result from electronic transmissions. I understand that the practice of medicine is not an exact science and that Practitioner makes no warranties or guarantees regarding treatment outcomes. I acknowledge that I will receive a copy of this consent concurrently upon execution via email to the email address I last provided but may also request a printed copy by calling the office of [facility].    I  understand that my insurance will be billed for this visit.   I have read or had this consent read to me.  I understand the contents of this consent, which adequately explains the benefits and risks of the Services being provided via telemedicine.   I have been provided ample opportunity to ask questions regarding this consent and the Services and have had my questions answered to my satisfaction.  I give my informed consent for the services to be provided through the use of telemedicine in my medical care  By participating in this telemedicine visit I agree to the above.

## 2019-02-07 NOTE — Progress Notes (Signed)
Virtual Visit via Video Note   This visit type was conducted due to national recommendations for restrictions regarding the COVID-19 Pandemic (e.g. social distancing) in an effort to limit this patient's exposure and mitigate transmission in our community.  Due to his co-morbid illnesses, this patient is at least at moderate risk for complications without adequate follow up.  This format is felt to be most appropriate for this patient at this time.  All issues noted in this document were discussed and addressed.  A limited physical exam was performed with this format.  Please refer to the patient's chart for his consent to telehealth for [facility].   Date:  02/08/2019   ID:  [name], DOB [birth year], MRN [id number]  Patient Location: Home Provider Location: Office  PCP:  Mayra Neer, MD  Cardiologist:  No primary care provider on file.  Electrophysiologist:  None   Evaluation Performed:  Follow-Up Visit  Chief Complaint:  CAD/AVR/Anticoagulation  History of Present Illness:    [name] is a 85 y.o. male with aortic stenosis, coronary artery disease, bioprosthetic aortic valve, atrial fibrillation/flutter, and chronic anticoagulation therapy. Chronic left pleural effusion.  The patient is doing well.  He is isolating himself at home.  He has had no chills fever or cough.  There is no peripheral edema, chest pain, orthopnea, PND, or syncope.  When last seen a year ago, he had fallen multiple times.  He suffered a fracture of his arm prior to last year's visit.  He has had no significant fall events in the interval.  The patient does not have symptoms concerning for COVID-19 infection (fever, chills, cough, or new shortness of breath).    Past Medical History:  Diagnosis Date  . Aortic stenosis 05/27/12   Severe to critical  . Arthritis    OSTEO OF KNEE  . Borderline diabetes   . BPH (benign prostatic hypertrophy)   . Coronary artery disease   . GI bleed   .  Heart murmur   . Hypercholesteremia   . Hypertension   . Ulcer    PEPTIC ULCER DZ...DR. Watt Climes   Past Surgical History:  Procedure Laterality Date  . AORTIC VALVE REPLACEMENT  09/17/2012   Procedure: AORTIC VALVE REPLACEMENT (AVR);  Surgeon: Grace Isaac, MD;  Location: [place];  Service: Open Heart Surgery;  Laterality: N/A;  . APPENDECTOMY    . CARDIAC VALVE REPLACEMENT  09-17-2012  . CORONARY ARTERY BYPASS GRAFT  09-17-2012   CABG x 3/ AVR  . CORONARY ARTERY BYPASS GRAFT  09/17/2012   Procedure: CORONARY ARTERY BYPASS GRAFTING (CABG);  Surgeon: Grace Isaac, MD;  Location: [place];  Service: Open Heart Surgery;  Laterality: N/A;  CABG x three,  using left internal mammary artery and right leg greater saphenous vein harvested endoscopically  . INTRAOPERATIVE TRANSESOPHAGEAL ECHOCARDIOGRAM  09/17/2012   Procedure: INTRAOPERATIVE TRANSESOPHAGEAL ECHOCARDIOGRAM;  Surgeon: Grace Isaac, MD;  Location: [place];  Service: Open Heart Surgery;  Laterality: N/A;     Current Meds  Medication Sig  . apixaban (ELIQUIS) 2.5 MG TABS tablet Take 1 tablet (2.5 mg total) by mouth 2 (two) times daily.  . famotidine (PEPCID) 20 MG tablet Take 20 mg by mouth daily.  Marland Kitchen HYDROcodone-acetaminophen (NORCO) 5-325 MG tablet Take 1 tablet by mouth every 6 (six) hours as needed for severe pain.  Marland Kitchen levothyroxine (SYNTHROID, LEVOTHROID) 100 MCG tablet Take 100 mcg by mouth daily before breakfast.   . metoprolol succinate (TOPROL-XL) 25  MG 24 hr tablet Take 25 mg by mouth daily.  . Multiple Vitamin (MULTIVITAMIN WITH MINERALS) TABS tablet Take 1 tablet by mouth daily.  . simvastatin (ZOCOR) 10 MG tablet Take 10 mg by mouth every evening.   . vitamin C (ASCORBIC ACID) 500 MG tablet Take 500 mg by mouth daily.     Allergies:   Patient has no known allergies.   Social History   Tobacco Use  . Smoking status: Never Smoker  . Smokeless tobacco: Never Used  Substance Use Topics  . Alcohol use: No  .  Drug use: No     Family Hx: The patient's family history includes Dementia in his mother; Hypertension in his father.  ROS:   Please see the history of present illness.    Feels fatigued, has excessive daytime sleepiness, and feels cold all the time.  Thyroid has been evaluated and is within normal range. All other systems reviewed and are negative.   Prior CV studies:   The following studies were reviewed today:  No new or recent cardiac graphic data.  Labs/Other Tests and Data Reviewed:    EKG:  No ECG reviewed.  Recent Labs: No results found for requested labs within last 8760 hours.   Recent Lipid Panel No results found for: CHOL, TRIG, HDL, CHOLHDL, LDLCALC, LDLDIRECT  Wt Readings from Last 3 Encounters:  02/08/19 160 lb 6.4 oz (72.8 kg)  02/09/18 164 lb 3.2 oz (74.5 kg)  12/15/16 164 lb (74.4 kg)     Objective:    Vital Signs:  BP 130/82   Ht 5' 7.5" (1.715 m)   Wt 160 lb 6.4 oz (72.8 kg)   BMI 24.75 kg/m    VITAL SIGNS:  reviewed  ASSESSMENT & PLAN:    1. S/P CABG (coronary artery bypass graft)   2. S/P aortic valve replacement with bioprosthetic valve   3. Essential hypertension   4. Atypical atrial flutter ([place])   5. Bifascicular bundle branch block   6. H/O hypokalemia   7. 2019 novel coronavirus disease (COVID-19)    PLAN:  1. He is stable from the standpoint of coronary disease. 2. Valve auscultation will be performed when he is able to make it in for an office visit over the next 6 months.  Based upon history, he is stable. 3. Blood pressure is stable. 4. Unable to evaluate, but will have an EKG when safe to come into the office. 5. Not assessed 6. This problem was treated with potassium a year ago according to the wife  Clinical follow-up with me in 6 to 12 months depending upon safety in the course of the pandemic.  COVID-19 Education: The signs and symptoms of COVID-19 were discussed with the patient and how to seek care for testing  (follow up with PCP or arrange E-visit).  The importance of social distancing was discussed today.  Time:   Today, I have spent 15 minutes with the patient with telehealth technology discussing the above problems.     Medication Adjustments/Labs and Tests Ordered: Current medicines are reviewed at length with the patient today.  Concerns regarding medicines are outlined above.   Tests Ordered: No orders of the defined types were placed in this encounter.   Medication Changes: No orders of the defined types were placed in this encounter.   Disposition:  Follow up in 6 month(s)  Signed, Sinclair Grooms, MD  02/08/2019 11:27 AM    [place]

## 2019-02-08 ENCOUNTER — Encounter: Payer: Self-pay | Admitting: Interventional Cardiology

## 2019-02-08 ENCOUNTER — Other Ambulatory Visit: Payer: Self-pay

## 2019-02-08 ENCOUNTER — Telehealth (INDEPENDENT_AMBULATORY_CARE_PROVIDER_SITE_OTHER): Payer: Medicare Other | Admitting: Interventional Cardiology

## 2019-02-08 VITALS — BP 130/82 | Ht 67.5 in | Wt 160.4 lb

## 2019-02-08 DIAGNOSIS — Z951 Presence of aortocoronary bypass graft: Secondary | ICD-10-CM

## 2019-02-08 DIAGNOSIS — Z953 Presence of xenogenic heart valve: Secondary | ICD-10-CM

## 2019-02-08 DIAGNOSIS — I452 Bifascicular block: Secondary | ICD-10-CM

## 2019-02-08 DIAGNOSIS — I10 Essential (primary) hypertension: Secondary | ICD-10-CM

## 2019-02-08 DIAGNOSIS — Z8639 Personal history of other endocrine, nutritional and metabolic disease: Secondary | ICD-10-CM

## 2019-02-08 DIAGNOSIS — U071 COVID-19: Secondary | ICD-10-CM

## 2019-02-08 DIAGNOSIS — I484 Atypical atrial flutter: Secondary | ICD-10-CM

## 2019-02-08 NOTE — Patient Instructions (Signed)
Medication Instructions:  Your physician recommends that you continue on your current medications as directed. Please refer to the Current Medication list given to you today.  If you need a refill on your cardiac medications before your next appointment, please call your pharmacy.   Lab work: None If you have labs (blood work) drawn today and your tests are completely normal, you will receive your results only by: . MyChart Message (if you have MyChart) OR . A paper copy in the mail If you have any lab test that is abnormal or we need to change your treatment, we will call you to review the results.  Testing/Procedures: None  Follow-Up: At [facility], you and your health needs are our priority.  As part of our continuing mission to provide you with exceptional heart care, we have created designated Provider Care Teams.  These Care Teams include your primary Cardiologist (physician) and Advanced Practice Providers (APPs -  Physician Assistants and Nurse Practitioners) who all work together to provide you with the care you need, when you need it. You will need a follow up appointment in 6-9 months.  Please call our office 2 months in advance to schedule this appointment.  You may see Dr. Smith or one of the following Advanced Practice Providers on your designated Care Team:   Lori Gerhardt, NP Laura Ingold, NP . Jill McDaniel, NP  Any Other Special Instructions Will Be Listed Below (If Applicable).    

## 2019-02-10 ENCOUNTER — Ambulatory Visit: Payer: Medicare Other | Admitting: Interventional Cardiology

## 2019-02-16 DIAGNOSIS — I110 Hypertensive heart disease with heart failure: Secondary | ICD-10-CM | POA: Diagnosis not present

## 2019-02-16 DIAGNOSIS — E039 Hypothyroidism, unspecified: Secondary | ICD-10-CM | POA: Diagnosis not present

## 2019-02-16 DIAGNOSIS — E1122 Type 2 diabetes mellitus with diabetic chronic kidney disease: Secondary | ICD-10-CM | POA: Diagnosis not present

## 2019-02-16 DIAGNOSIS — I5030 Unspecified diastolic (congestive) heart failure: Secondary | ICD-10-CM | POA: Diagnosis not present

## 2019-02-16 DIAGNOSIS — E782 Mixed hyperlipidemia: Secondary | ICD-10-CM | POA: Diagnosis not present

## 2019-02-16 DIAGNOSIS — N182 Chronic kidney disease, stage 2 (mild): Secondary | ICD-10-CM | POA: Diagnosis not present

## 2019-02-16 DIAGNOSIS — I482 Chronic atrial fibrillation, unspecified: Secondary | ICD-10-CM | POA: Diagnosis not present

## 2019-03-03 DIAGNOSIS — Z961 Presence of intraocular lens: Secondary | ICD-10-CM | POA: Diagnosis not present

## 2019-03-03 DIAGNOSIS — Z0100 Encounter for examination of eyes and vision without abnormal findings: Secondary | ICD-10-CM | POA: Diagnosis not present

## 2019-04-28 ENCOUNTER — Other Ambulatory Visit: Payer: Self-pay | Admitting: Interventional Cardiology

## 2019-04-28 NOTE — Telephone Encounter (Signed)
Eliquis 2.5mg  refill request received; pt is 85 yrs old, wt-74.5kg, Crea-1.02 via [facility] at [place] on 10/12/2018, last Telemedicine Visit by Dr. Tamala Julian on 02/08/2019, Diagnosis Afib.   Per dosing criteria pt is on incorrect dose. Per the last refill note from [place] on 11/03/2018:a message was sent to Dr. Tamala Julian on 01/27/17 regarding this and included the pt's hx of peptic ulcer disease with bleeding on 01/27/17 and he stated that the pt should remain on Eliquis 2.5mg .  Will send in refill to requested pharmacy.

## 2019-05-30 DIAGNOSIS — R410 Disorientation, unspecified: Secondary | ICD-10-CM | POA: Diagnosis not present

## 2019-06-06 ENCOUNTER — Other Ambulatory Visit (HOSPITAL_COMMUNITY): Payer: Self-pay | Admitting: *Deleted

## 2019-06-07 ENCOUNTER — Ambulatory Visit (HOSPITAL_COMMUNITY)
Admission: RE | Admit: 2019-06-07 | Discharge: 2019-06-07 | Disposition: A | Discharge status: Home / Self Care | Payer: Medicare Other | Source: Ambulatory Visit | Attending: Family Medicine | Admitting: Family Medicine

## 2019-06-07 ENCOUNTER — Other Ambulatory Visit: Payer: Self-pay

## 2019-06-07 DIAGNOSIS — D649 Anemia, unspecified: Secondary | ICD-10-CM | POA: Insufficient documentation

## 2019-06-07 LAB — PREPARE RBC (CROSSMATCH)

## 2019-06-08 ENCOUNTER — Encounter (HOSPITAL_COMMUNITY)
Admission: RE | Admit: 2019-06-08 | Discharge: 2019-06-08 | Disposition: A | Discharge status: Home / Self Care | Payer: Medicare Other | Source: Ambulatory Visit | Attending: Family Medicine | Admitting: Family Medicine

## 2019-06-08 ENCOUNTER — Other Ambulatory Visit: Payer: Self-pay

## 2019-06-08 DIAGNOSIS — D649 Anemia, unspecified: Secondary | ICD-10-CM | POA: Insufficient documentation

## 2019-06-08 LAB — PREPARE RBC (CROSSMATCH)

## 2019-06-08 LAB — HEMOGLOBIN AND HEMATOCRIT, BLOOD
HCT: 34.1 % — ABNORMAL LOW (ref 39.0–52.0)
Hemoglobin: 10.9 g/dL — ABNORMAL LOW (ref 13.0–17.0)

## 2019-06-08 MED ORDER — FUROSEMIDE 10 MG/ML IJ SOLN
INTRAMUSCULAR | Status: AC
  Filled 2019-06-08: qty 2

## 2019-06-08 MED ORDER — SODIUM CHLORIDE 0.9% IV SOLUTION: Freq: Once | INTRAVENOUS | Status: DC

## 2019-06-08 MED ORDER — FUROSEMIDE 10 MG/ML IJ SOLN
20.0000 mg | Freq: Once | INTRAMUSCULAR | Status: AC
  Administered 2019-06-08: 12:00:00 20 mg via INTRAVENOUS

## 2019-06-08 MED ORDER — FUROSEMIDE 10 MG/ML IJ SOLN
INTRAMUSCULAR | Status: AC
  Administered 2019-06-08: 20 mg via INTRAVENOUS
  Filled 2019-06-08: qty 2

## 2019-06-09 LAB — BPAM RBC
Blood Product Expiration Date: 202010062359
Blood Product Expiration Date: 202010062359
ISSUE DATE / TIME: 202009020829
ISSUE DATE / TIME: 202009021138
Unit Type and Rh: 5100
Unit Type and Rh: 5100

## 2019-06-09 LAB — TYPE AND SCREEN
ABO/RH(D): O POS
Antibody Screen: NEGATIVE
Unit division: 0
Unit division: 0

## 2019-06-22 ENCOUNTER — Other Ambulatory Visit: Payer: Self-pay | Admitting: Family Medicine

## 2019-06-22 ENCOUNTER — Ambulatory Visit
Admission: RE | Admit: 2019-06-22 | Discharge: 2019-06-22 | Disposition: A | Discharge status: Home / Self Care | Payer: Medicare Other | Source: Ambulatory Visit | Attending: Family Medicine | Admitting: Family Medicine

## 2019-06-22 DIAGNOSIS — R059 Cough, unspecified: Secondary | ICD-10-CM

## 2019-06-22 DIAGNOSIS — R05 Cough: Secondary | ICD-10-CM

## 2019-06-22 DIAGNOSIS — Z23 Encounter for immunization: Secondary | ICD-10-CM | POA: Diagnosis not present

## 2019-06-22 DIAGNOSIS — R5383 Other fatigue: Secondary | ICD-10-CM | POA: Diagnosis not present

## 2019-06-22 DIAGNOSIS — D649 Anemia, unspecified: Secondary | ICD-10-CM | POA: Diagnosis not present

## 2019-06-27 ENCOUNTER — Telehealth: Payer: Self-pay

## 2019-06-27 DIAGNOSIS — I10 Essential (primary) hypertension: Secondary | ICD-10-CM

## 2019-06-27 NOTE — Telephone Encounter (Signed)
-----   Message from Belva Crome, MD sent at 06/23/2019 12:09 PM EDT ----- Let the patient know find out if he is short of breath.  Chest x-ray suggests some fluid buildup.  Is anybody doing blood work?  If so let us get a copy of most recent basic metabolic panel.  If not, he needs a basic metabolic panel and BNP.  May need to be on diuretic therapy. A copy will be sent to Mayra Neer, MD

## 2019-06-27 NOTE — Telephone Encounter (Signed)
Spoke with the pts wife and she says the pt has not been SOB and has been breathing well.. Dr. Brigitte Pulse started him on Lasix 20 mg qd for 5 days starting last Friday and ending tomorrow for on and off ankle edema.. she reports he is not scheduled to have more labs with Dr. Brigitte Pulse but to call if his symptoms are not better or worsen. She says he is doing well today and feels the Lasix has made an improvement.   Will forward to Dr. Tamala Julian for review and advice when to get BNP and BMET with this added information.   Requested 05/2019 BMET form Dr. Brigitte Pulse to be faxed to our office.

## 2019-06-28 NOTE — Telephone Encounter (Signed)
I spoke to Dr Raul Del office and they will fax BMET to [place].

## 2019-06-28 NOTE — Telephone Encounter (Signed)
BMET from 8/24 has been scanned in.  Please advise on BNP on when to draw.  Thank you.

## 2019-06-29 NOTE — Telephone Encounter (Signed)
I spoke to the patient and his wife and they will come Monday for BNP/BMP labs.

## 2019-06-29 NOTE — Telephone Encounter (Signed)
BNP and BMET later this week or early next week. Will consider whether to resume lasix or use prn based on data.

## 2019-07-04 ENCOUNTER — Other Ambulatory Visit: Payer: Medicare Other

## 2019-07-08 ENCOUNTER — Telehealth: Payer: Self-pay | Admitting: Vascular Surgery

## 2019-07-08 ENCOUNTER — Ambulatory Visit
Admission: RE | Admit: 2019-07-08 | Discharge: 2019-07-08 | Disposition: A | Discharge status: Home / Self Care | Payer: Medicare Other | Source: Ambulatory Visit | Attending: Vascular Surgery | Admitting: Vascular Surgery

## 2019-07-08 DIAGNOSIS — I6782 Cerebral ischemia: Secondary | ICD-10-CM | POA: Diagnosis not present

## 2019-07-08 DIAGNOSIS — F05 Delirium due to known physiological condition: Secondary | ICD-10-CM

## 2019-07-08 NOTE — Telephone Encounter (Signed)
Contacted pt daughter about head CT result.  No evidence of bleed.  Diffuse atrophy.  He has labwork scheduled for early next week  Ruta Hinds, MD [facility] [place] Office: [phone] Pager: [phone]

## 2019-07-08 NOTE — Telephone Encounter (Signed)
Patient is a 85 year old male who has had fairly sudden deterioration of his mental status over the last 36 hours.  He has some baseline mild confusion.  Yesterday he was noted to have some hallucinations of animals and insects crawling.  He is on Eliquis for anticoagulation.  He also has a prior history of hyponatremia.  Neurologic exam he is alert and oriented to self but not time and place upper extremity and lower extremity motor strength is 5/5 and nonfocal.  He has no facial droop.  Assessment: Acute deterioration in mental status  Plan: We will order a noncontrast head CT to rule out intracranial bleed as source of confusion.  Ruta Hinds, MD [facility] [place] Office: [phone] Pager: [phone]

## 2019-07-11 ENCOUNTER — Other Ambulatory Visit: Payer: Medicare Other | Admitting: *Deleted

## 2019-07-11 ENCOUNTER — Other Ambulatory Visit: Payer: Self-pay

## 2019-07-11 DIAGNOSIS — I10 Essential (primary) hypertension: Secondary | ICD-10-CM | POA: Diagnosis not present

## 2019-07-11 DIAGNOSIS — I509 Heart failure, unspecified: Secondary | ICD-10-CM | POA: Diagnosis not present

## 2019-07-12 LAB — BASIC METABOLIC PANEL
BUN/Creatinine Ratio: 10 (ref 10–24)
BUN: 11 mg/dL (ref 10–36)
CO2: 25 mmol/L (ref 20–29)
Calcium: 9.9 mg/dL (ref 8.6–10.2)
Chloride: 92 mmol/L — ABNORMAL LOW (ref 96–106)
Creatinine, Ser: 1.13 mg/dL (ref 0.76–1.27)
GFR calc Af Amer: 64 mL/min/{1.73_m2} (ref >59)
GFR calc non Af Amer: 56 mL/min/{1.73_m2} — ABNORMAL LOW (ref >59)
Glucose: 121 mg/dL — ABNORMAL HIGH (ref 65–99)
Potassium: 4.5 mmol/L (ref 3.5–5.2)
Sodium: 129 mmol/L — ABNORMAL LOW (ref 134–144)

## 2019-07-12 LAB — PRO B NATRIURETIC PEPTIDE: NT-Pro BNP: 3451 pg/mL — ABNORMAL HIGH (ref 0–486)

## 2019-07-14 ENCOUNTER — Telehealth: Payer: Self-pay | Admitting: *Deleted

## 2019-07-14 DIAGNOSIS — I10 Essential (primary) hypertension: Secondary | ICD-10-CM

## 2019-07-14 DIAGNOSIS — I509 Heart failure, unspecified: Secondary | ICD-10-CM

## 2019-07-14 MED ORDER — POTASSIUM CHLORIDE ER 10 MEQ PO TBCR: 10.0000 meq | EXTENDED_RELEASE_TABLET | Freq: Every day | ORAL | 3 refills | Status: DC

## 2019-07-14 MED ORDER — FUROSEMIDE 20 MG PO TABS: 20.0000 mg | ORAL_TABLET | Freq: Every day | ORAL | 3 refills | Status: DC

## 2019-07-14 NOTE — Telephone Encounter (Signed)
Spoke with daughter, DPR on file.  Made her aware of results and recommendations.  Will send prescriptions to [facility].  Pt will come for labs on 10/19.  Advised daughter if any issues prior to labs, give [place] call.  Daughter verbalized understanding and was in agreement with this plan.

## 2019-07-14 NOTE — Telephone Encounter (Signed)
-----   Message from Belva Crome, MD sent at 07/14/2019  7:49 AM EDT ----- Let the patient know the blood work suggests CHF. Should take furosemide 20 mg daily. Will need BMET and BNP 10 days. Also K Dur 10 meq daily. A copy will be sent to Mayra Neer, MD

## 2019-07-25 ENCOUNTER — Other Ambulatory Visit: Payer: Self-pay

## 2019-07-25 ENCOUNTER — Other Ambulatory Visit: Payer: Medicare Other | Admitting: *Deleted

## 2019-07-25 DIAGNOSIS — I10 Essential (primary) hypertension: Secondary | ICD-10-CM | POA: Diagnosis not present

## 2019-07-25 DIAGNOSIS — I509 Heart failure, unspecified: Secondary | ICD-10-CM | POA: Diagnosis not present

## 2019-07-26 LAB — BASIC METABOLIC PANEL
BUN/Creatinine Ratio: 11 (ref 10–24)
BUN: 12 mg/dL (ref 10–36)
CO2: 22 mmol/L (ref 20–29)
Calcium: 10 mg/dL (ref 8.6–10.2)
Chloride: 89 mmol/L — ABNORMAL LOW (ref 96–106)
Creatinine, Ser: 1.06 mg/dL (ref 0.76–1.27)
GFR calc Af Amer: 70 mL/min/{1.73_m2} (ref >59)
GFR calc non Af Amer: 60 mL/min/{1.73_m2} (ref >59)
Glucose: 118 mg/dL — ABNORMAL HIGH (ref 65–99)
Potassium: 3.9 mmol/L (ref 3.5–5.2)
Sodium: 126 mmol/L — ABNORMAL LOW (ref 134–144)

## 2019-07-26 LAB — PRO B NATRIURETIC PEPTIDE: NT-Pro BNP: 3330 pg/mL — ABNORMAL HIGH (ref 0–486)

## 2019-07-28 ENCOUNTER — Telehealth: Payer: Self-pay | Admitting: Interventional Cardiology

## 2019-07-28 NOTE — Telephone Encounter (Signed)
See lab results for notes  

## 2019-07-28 NOTE — Telephone Encounter (Signed)
New Message     Patient returning phone call for lab results.

## 2019-08-09 NOTE — Progress Notes (Signed)
Cardiology Office Note:    Date:  08/10/2019   ID:  [name], DOB 1924-04-16, MRN [id number]  PCP:  Mayra Neer, MD  Cardiologist:  Sinclair Grooms, MD   Referring MD: Mayra Neer, MD   Chief Complaint  Patient presents with  . Congestive Heart Failure  . Atrial Fibrillation  . Coronary Artery Disease    History of Present Illness:    [name] is a 85 y.o. male with a hx of aortic stenosis, coronary artery disease, bioprosthetic aortic valve, atrial fibrillation/flutter, and chronic anticoagulation therapy. Chronic left pleural effusion.  Mr. [name] was having progressive shortness of breath over the past 2 weeks.  He denies chest pain.  There is some orthopnea.  He has had some lower extremity swelling.  Appetite has somewhat decreased.  No anginal pain.  No syncope.  No palpitations.  Past Medical History:  Diagnosis Date  . Aortic stenosis 05/27/12   Severe to critical  . Arthritis    OSTEO OF KNEE  . Borderline diabetes   . BPH (benign prostatic hypertrophy)   . Coronary artery disease   . GI bleed   . Heart murmur   . Hypercholesteremia   . Hypertension   . Ulcer    PEPTIC ULCER DZ...DR. Watt Climes    Past Surgical History:  Procedure Laterality Date  . AORTIC VALVE REPLACEMENT  09/17/2012   Procedure: AORTIC VALVE REPLACEMENT (AVR);  Surgeon: Grace Isaac, MD;  Location: [place];  Service: Open Heart Surgery;  Laterality: N/A;  . APPENDECTOMY    . CARDIAC VALVE REPLACEMENT  09-17-2012  . CORONARY ARTERY BYPASS GRAFT  09-17-2012   CABG x 3/ AVR  . CORONARY ARTERY BYPASS GRAFT  09/17/2012   Procedure: CORONARY ARTERY BYPASS GRAFTING (CABG);  Surgeon: Grace Isaac, MD;  Location: [place];  Service: Open Heart Surgery;  Laterality: N/A;  CABG x three,  using left internal mammary artery and right leg greater saphenous vein harvested endoscopically  . INTRAOPERATIVE TRANSESOPHAGEAL ECHOCARDIOGRAM  09/17/2012   Procedure: INTRAOPERATIVE  TRANSESOPHAGEAL ECHOCARDIOGRAM;  Surgeon: Grace Isaac, MD;  Location: [place];  Service: Open Heart Surgery;  Laterality: N/A;    Current Medications: Current Meds  Medication Sig  . ELIQUIS 2.5 MG TABS tablet TAKE 1 TABLET TWICE A DAY  . famotidine (PEPCID) 20 MG tablet Take 20 mg by mouth daily.  Marland Kitchen HYDROcodone-acetaminophen (NORCO) 5-325 MG tablet Take 1 tablet by mouth every 6 (six) hours as needed for severe pain.  Marland Kitchen levothyroxine (SYNTHROID, LEVOTHROID) 100 MCG tablet Take 100 mcg by mouth daily before breakfast.   . metoprolol succinate (TOPROL-XL) 25 MG 24 hr tablet Take 25 mg by mouth daily.  . Multiple Vitamin (MULTIVITAMIN WITH MINERALS) TABS tablet Take 1 tablet by mouth daily.  . potassium chloride (KLOR-CON) 10 MEQ tablet Take 1 tablet (10 mEq total) by mouth daily.  . traZODone (DESYREL) 50 MG tablet Take 25-50 mg by mouth at bedtime as needed.  . vitamin C (ASCORBIC ACID) 500 MG tablet Take 500 mg by mouth daily.  . [DISCONTINUED] furosemide (LASIX) 20 MG tablet Take 1 tablet (20 mg total) by mouth daily.     Allergies:   Patient has no known allergies.   Social History   Socioeconomic History  . Marital status: Married    Spouse name: Not on file  . Number of children: Not on file  . Years of education: Not on file  . Highest education level: Not on  file  Occupational History  . Not on file  Social Needs  . Financial resource strain: Not on file  . Food insecurity    Worry: Not on file    Inability: Not on file  . Transportation needs    Medical: Not on file    Non-medical: Not on file  Tobacco Use  . Smoking status: Never Smoker  . Smokeless tobacco: Never Used  Substance and Sexual Activity  . Alcohol use: No  . Drug use: No  . Sexual activity: Not on file  Lifestyle  . Physical activity    Days per week: Not on file    Minutes per session: Not on file  . Stress: Not on file  Relationships  . Social Herbalist on phone: Not on file     Gets together: Not on file    Attends religious service: Not on file    Active member of club or organization: Not on file    Attends meetings of clubs or organizations: Not on file    Relationship status: Not on file  Other Topics Concern  . Not on file  Social History Narrative  . Not on file     Family History: The patient's family history includes Dementia in his mother; Hypertension in his father.  ROS:   Please see the history of present illness.    Cannot do much without giving out.  His wife says that he sits most of the time and does not do much activity.  All other systems reviewed and are negative.  EKGs/Labs/Other Studies Reviewed:    The following studies were reviewed today: 2D Doppler echocardiogram 03/2015: Study Conclusions  - Left ventricle: The cavity size was normal. Wall thickness was   increased in a pattern of moderate LVH. There was focal basal   hypertrophy. Systolic function was normal. The estimated ejection   fraction was in the range of 50% to 55%. Wall motion was normal;   there were no regional wall motion abnormalities. - Aortic valve: A bioprosthesis was present. There was mild   regurgitation. - Mitral valve: Severely calcified annulus. There was mild   regurgitation. - Left atrium: The atrium was moderately dilated.  EKG:  EKG normal sinus rhythm at 98 bpm, right bundle branch block, left posterior hemiblock, and when compared to prior tracing from May 2019, the heart rate is faster.  Recent Labs: 06/08/2019: Hemoglobin 10.9 07/25/2019: BUN 12; Creatinine, Ser 1.06; NT-Pro BNP 3,330; Potassium 3.9; Sodium 126  Recent Lipid Panel No results found for: CHOL, TRIG, HDL, CHOLHDL, VLDL, LDLCALC, LDLDIRECT  Physical Exam:    VS:  BP (!) 156/84   Pulse 98   Ht 5' 8.5" (1.74 m)   Wt 157 lb 12.8 oz (71.6 kg)   SpO2 91%   BMI 23.64 kg/m     Wt Readings from Last 3 Encounters:  08/10/19 157 lb 12.8 oz (71.6 kg)  06/08/19 175 lb (79.4 kg)   02/08/19 160 lb 6.4 oz (72.8 kg)     GEN: Elderly and age-appropriate.  Frail appearing.. No acute distress HEENT: Normal NECK: No JVD. LYMPHATICS: No lymphadenopathy CARDIAC:  RRR without murmur, gallop, or edema. VASCULAR:  Normal Pulses. No bruits. RESPIRATORY:  Clear to auscultation without rales, wheezing or rhonchi  ABDOMEN: Soft, non-tender, non-distended, No pulsatile mass, MUSCULOSKELETAL: No deformity  SKIN: Warm and dry NEUROLOGIC:  Alert and oriented x 3 PSYCHIATRIC:  Normal affect   ASSESSMENT:    1. S/P  CABG (coronary artery bypass graft)   2. S/P aortic valve replacement with bioprosthetic valve   3. Essential hypertension   4. Atypical atrial flutter ([place])   5. Bifascicular bundle branch block   6. Acute diastolic heart failure ([place])   7. Educated about COVID-19 virus infection   8. SOB (shortness of breath)    PLAN:    In order of problems listed above:  1. Not having angina.  No EKG changes to suggest ischemia. 2. A soft systolic murmur is heard but is not compatible with aortic stenosis or significant AR. 3. Blood pressure is moderately elevated with systolic pressure greater than 160. 4. He may be back in atrial flutter as his heart rate is fixed at approximately 98 bpm.  Will not go down the rhythm track at this point. 5. Unchanged. 6. He has acute diastolic on chronic diastolic heart failure.  Plan increase furosemide dose to 40 mg daily after he takes 40 mg p.o. this afternoon and then 40 mg p.o. twice daily on Thursday.  Friday will be the first day of 40 mg daily.  A C-met, BNP, and CBC is obtained today.  He will need to have a 1 week basic metabolic panel. 7. The 3W's were discussed and are endorsed by the patient and wife.  He is elderly, frail, and in heart failure.  He does not want to be in the hospital.  I will plan to see him back again.   Medication Adjustments/Labs and Tests Ordered: Current medicines are reviewed at length with the  patient today.  Concerns regarding medicines are outlined above.  Orders Placed This Encounter  Procedures  . Basic metabolic panel  . Hepatic function panel  . CBC  . Pro b natriuretic peptide  . Basic metabolic panel  . EKG 12-Lead   Meds ordered this encounter  Medications  . furosemide (LASIX) 40 MG tablet    Sig: Take 1 tablet (40 mg total) by mouth daily.    Dispense:  90 tablet    Refill:  3    Dose change    Patient Instructions  Medication Instructions:  1) TAKE Furosemide 53m as soon as you get home 2) TAKE Furosemide 47mtwice tomorrow (once in the morning and once in the evening) 3) Starting Friday, take Furosemide 4053mnce daily in the morning.  *If you need a refill on your cardiac medications before your next appointment, please call your pharmacy*  Lab Work: BMET, Liver, Pro BNP and CBC today  If you have labs (blood work) drawn today and your tests are completely normal, you will receive your results only by: . MMarland KitchenChart Message (if you have MyChart) OR . A paper copy in the mail If you have any lab test that is abnormal or we need to change your treatment, we will call you to review the results.  Testing/Procedures: None  Follow-Up: [facility] and your health needs are our priority.  As part of our continuing mission to provide you with exceptional heart care, we have created designated Provider Care Teams.  These Care Teams include your primary Cardiologist (physician) and Advanced Practice Providers (APPs -  Physician Assistants and Nurse Practitioners) who all work together to provide you with the care you need, when you need it.  Your next appointment:   1 week  The format for your next appointment:   In Person  Provider:   You may see HenSinclair GroomsD or one of the  following Advanced Practice Providers on your designated Care Team:    Truitt Merle, NP  Cecilie Kicks, NP  Kathyrn Drown, NP   Other Instructions       Signed, Sinclair Grooms, MD  08/10/2019 2:11 PM    [place]

## 2019-08-10 ENCOUNTER — Encounter: Payer: Self-pay | Admitting: Interventional Cardiology

## 2019-08-10 ENCOUNTER — Other Ambulatory Visit: Payer: Self-pay

## 2019-08-10 ENCOUNTER — Ambulatory Visit (INDEPENDENT_AMBULATORY_CARE_PROVIDER_SITE_OTHER): Payer: Medicare Other | Admitting: Interventional Cardiology

## 2019-08-10 VITALS — BP 156/84 | HR 98 | Ht 68.5 in | Wt 157.8 lb

## 2019-08-10 DIAGNOSIS — Z7189 Other specified counseling: Secondary | ICD-10-CM | POA: Diagnosis not present

## 2019-08-10 DIAGNOSIS — Z953 Presence of xenogenic heart valve: Secondary | ICD-10-CM

## 2019-08-10 DIAGNOSIS — I10 Essential (primary) hypertension: Secondary | ICD-10-CM

## 2019-08-10 DIAGNOSIS — I484 Atypical atrial flutter: Secondary | ICD-10-CM | POA: Diagnosis not present

## 2019-08-10 DIAGNOSIS — Z951 Presence of aortocoronary bypass graft: Secondary | ICD-10-CM

## 2019-08-10 DIAGNOSIS — I5031 Acute diastolic (congestive) heart failure: Secondary | ICD-10-CM | POA: Diagnosis not present

## 2019-08-10 DIAGNOSIS — R0602 Shortness of breath: Secondary | ICD-10-CM

## 2019-08-10 DIAGNOSIS — I452 Bifascicular block: Secondary | ICD-10-CM

## 2019-08-10 MED ORDER — FUROSEMIDE 40 MG PO TABS: 40.0000 mg | ORAL_TABLET | Freq: Every day | ORAL | 3 refills | Status: AC

## 2019-08-10 NOTE — Patient Instructions (Signed)
Medication Instructions:  1) TAKE Furosemide 40mg  as soon as you get home 2) TAKE Furosemide 40mg  twice tomorrow (once in the morning and once in the evening) 3) Starting Friday, take Furosemide 40mg  once daily in the morning.  *If you need a refill on your cardiac medications before your next appointment, please call your pharmacy*  Lab Work: BMET, Liver, Pro BNP and CBC today  If you have labs (blood work) drawn today and your tests are completely normal, you will receive your results only by: Marland Kitchen MyChart Message (if you have MyChart) OR . A paper copy in the mail If you have any lab test that is abnormal or we need to change your treatment, we will call you to review the results.  Testing/Procedures: None  Follow-Up: At [facility], you and your health needs are our priority.  As part of our continuing mission to provide you with exceptional heart care, we have created designated Provider Care Teams.  These Care Teams include your primary Cardiologist (physician) and Advanced Practice Providers (APPs -  Physician Assistants and Nurse Practitioners) who all work together to provide you with the care you need, when you need it.  Your next appointment:   1 week  The format for your next appointment:   In Person  Provider:   You may see Sinclair Grooms, MD or one of the following Advanced Practice Providers on your designated Care Team:    Truitt Merle, NP  Cecilie Kicks, NP  Kathyrn Drown, NP   Other Instructions

## 2019-08-11 LAB — HEPATIC FUNCTION PANEL
ALT: 14 IU/L (ref 0–44)
AST: 29 IU/L (ref 0–40)
Albumin: 3.9 g/dL (ref 3.5–4.6)
Alkaline Phosphatase: 90 IU/L (ref 39–117)
Bilirubin Total: 0.7 mg/dL (ref 0.0–1.2)
Bilirubin, Direct: 0.3 mg/dL (ref 0.00–0.40)
Total Protein: 7.8 g/dL (ref 6.0–8.5)

## 2019-08-11 LAB — BASIC METABOLIC PANEL
BUN/Creatinine Ratio: 25 — ABNORMAL HIGH (ref 10–24)
BUN: 29 mg/dL (ref 10–36)
CO2: 23 mmol/L (ref 20–29)
Calcium: 10.4 mg/dL — ABNORMAL HIGH (ref 8.6–10.2)
Chloride: 100 mmol/L (ref 96–106)
Creatinine, Ser: 1.14 mg/dL (ref 0.76–1.27)
GFR calc Af Amer: 64 mL/min/{1.73_m2} (ref >59)
GFR calc non Af Amer: 55 mL/min/{1.73_m2} — ABNORMAL LOW (ref >59)
Glucose: 113 mg/dL — ABNORMAL HIGH (ref 65–99)
Potassium: 4.2 mmol/L (ref 3.5–5.2)
Sodium: 137 mmol/L (ref 134–144)

## 2019-08-11 LAB — CBC
Hematocrit: 35 % — ABNORMAL LOW (ref 37.5–51.0)
Hemoglobin: 11.1 g/dL — ABNORMAL LOW (ref 13.0–17.7)
MCH: 28.2 pg (ref 26.6–33.0)
MCHC: 31.7 g/dL (ref 31.5–35.7)
MCV: 89 fL (ref 79–97)
Platelets: 215 10*3/uL (ref 150–450)
RBC: 3.93 x10E6/uL — ABNORMAL LOW (ref 4.14–5.80)
RDW: 15.8 % — ABNORMAL HIGH (ref 11.6–15.4)
WBC: 8.6 10*3/uL (ref 3.4–10.8)

## 2019-08-11 LAB — PRO B NATRIURETIC PEPTIDE: NT-Pro BNP: 6202 pg/mL — ABNORMAL HIGH (ref 0–486)

## 2019-08-17 ENCOUNTER — Telehealth: Payer: Self-pay | Admitting: Interventional Cardiology

## 2019-08-17 NOTE — Progress Notes (Signed)
Cardiology Office Note:    Date:  08/19/2019   ID:  [name], DOB August 25, 1924, MRN [id number]  PCP:  Mayra Neer, MD  Cardiologist:  Sinclair Grooms, MD   Referring MD: Mayra Neer, MD   Chief Complaint  Patient presents with  . Congestive Heart Failure    History of Present Illness:    [name] is a 85 y.o. male with a hx of aortic stenosis, coronary artery disease, bioprosthetic aortic valve, atrial fibrillation/flutter, and chronic anticoagulation therapy. Chronic left pleural effusion.  He was having progressive shortness of breath when I saw him 1 week ago.  We intensify diuretic therapy.  He says he feels better and cannot actually recall how he felt 2 weeks ago or last week.  He denies orthopnea.  Lower extremity swelling is improved.  No side effects to the current medical regimen.  Since increasing the diuretic intensity, weight has decreased from 157 pounds August 09, 2021 146 pounds today.  Past Medical History:  Diagnosis Date  . Aortic stenosis 05/27/12   Severe to critical  . Arthritis    OSTEO OF KNEE  . Borderline diabetes   . BPH (benign prostatic hypertrophy)   . Coronary artery disease   . GI bleed   . Heart murmur   . Hypercholesteremia   . Hypertension   . Ulcer    PEPTIC ULCER DZ...DR. Watt Climes    Past Surgical History:  Procedure Laterality Date  . AORTIC VALVE REPLACEMENT  09/17/2012   Procedure: AORTIC VALVE REPLACEMENT (AVR);  Surgeon: Grace Isaac, MD;  Location: [facility];  Service: Open Heart Surgery;  Laterality: N/A;  . APPENDECTOMY    . CARDIAC VALVE REPLACEMENT  09-17-2012  . CORONARY ARTERY BYPASS GRAFT  09-17-2012   CABG x 3/ AVR  . CORONARY ARTERY BYPASS GRAFT  09/17/2012   Procedure: CORONARY ARTERY BYPASS GRAFTING (CABG);  Surgeon: Grace Isaac, MD;  Location: [facility];  Service: Open Heart Surgery;  Laterality: N/A;  CABG x three,  using left internal mammary artery and right leg greater saphenous vein  harvested endoscopically  . INTRAOPERATIVE TRANSESOPHAGEAL ECHOCARDIOGRAM  09/17/2012   Procedure: INTRAOPERATIVE TRANSESOPHAGEAL ECHOCARDIOGRAM;  Surgeon: Grace Isaac, MD;  Location: [facility];  Service: Open Heart Surgery;  Laterality: N/A;    Current Medications: Current Meds  Medication Sig  . ELIQUIS 2.5 MG TABS tablet TAKE 1 TABLET TWICE A DAY  . famotidine (PEPCID) 20 MG tablet Take 20 mg by mouth daily.  . furosemide (LASIX) 40 MG tablet Take 1 tablet (40 mg total) by mouth daily.  Marland Kitchen HYDROcodone-acetaminophen (NORCO) 5-325 MG tablet Take 1 tablet by mouth every 6 (six) hours as needed for severe pain.  Marland Kitchen levothyroxine (SYNTHROID, LEVOTHROID) 100 MCG tablet Take 100 mcg by mouth daily before breakfast.   . metoprolol succinate (TOPROL-XL) 25 MG 24 hr tablet Take 25 mg by mouth daily.  . Multiple Vitamin (MULTIVITAMIN WITH MINERALS) TABS tablet Take 1 tablet by mouth daily.  . potassium chloride (KLOR-CON) 10 MEQ tablet Take 1 tablet (10 mEq total) by mouth daily.  . traZODone (DESYREL) 50 MG tablet Take 25-50 mg by mouth at bedtime as needed.  . vitamin C (ASCORBIC ACID) 500 MG tablet Take 500 mg by mouth daily.     Allergies:   Patient has no known allergies.   Social History   Socioeconomic History  . Marital status: Married    Spouse name: Not on file  . Number of  children: Not on file  . Years of education: Not on file  . Highest education level: Not on file  Occupational History  . Not on file  Social Needs  . Financial resource strain: Not on file  . Food insecurity    Worry: Not on file    Inability: Not on file  . Transportation needs    Medical: Not on file    Non-medical: Not on file  Tobacco Use  . Smoking status: Never Smoker  . Smokeless tobacco: Never Used  Substance and Sexual Activity  . Alcohol use: No  . Drug use: No  . Sexual activity: Not on file  Lifestyle  . Physical activity    Days per week: Not on file    Minutes per session: Not on  file  . Stress: Not on file  Relationships  . Social Herbalist on phone: Not on file    Gets together: Not on file    Attends religious service: Not on file    Active member of club or organization: Not on file    Attends meetings of clubs or organizations: Not on file    Relationship status: Not on file  Other Topics Concern  . Not on file  Social History Narrative  . Not on file     Family History: The patient's family history includes Dementia in his mother; Hypertension in his father.  ROS:   Please see the history of present illness.    Decreased memory.  Very sedentary according to his sitter.  He sleeps a lot.  All other systems reviewed and are negative.  EKGs/Labs/Other Studies Reviewed:    The following studies were reviewed today: No new data  EKG:  EKG not repeated  Recent Labs: 08/10/2019: ALT 14; BUN 29; Creatinine, Ser 1.14; Hemoglobin 11.1; NT-Pro BNP 6,202; Platelets 215; Potassium 4.2; Sodium 137  Recent Lipid Panel No results found for: CHOL, TRIG, HDL, CHOLHDL, VLDL, LDLCALC, LDLDIRECT  Physical Exam:    VS:  BP 128/70   Pulse 71   Ht 5' 8.5" (1.74 m)   Wt 146 lb 1.9 oz (66.3 kg)   SpO2 95%   BMI 21.89 kg/m     Wt Readings from Last 3 Encounters:  08/19/19 146 lb 1.9 oz (66.3 kg)  08/10/19 157 lb 12.8 oz (71.6 kg)  06/08/19 175 lb (79.4 kg)     GEN: Compatible with age.  He is frail.. No acute distress HEENT: Normal NECK: AC V wave is noted at the top of the clavicle. LYMPHATICS: No lymphadenopathy CARDIAC:  RRR with a 2/6 right upper sternal systolic murmur.  Significant decrease in shin and ankle edema. VASCULAR:  Normal Pulses. No bruits. RESPIRATORY:  Clear to auscultation without rales, wheezing or rhonchi  ABDOMEN: Soft, non-tender, non-distended, No pulsatile mass, MUSCULOSKELETAL: No deformity  SKIN: Warm and dry NEUROLOGIC:  Alert and oriented x 3 PSYCHIATRIC:  Normal affect   ASSESSMENT:    1. Acute diastolic  heart failure ([place])   2. S/P CABG (coronary artery bypass graft)   3. S/P aortic valve replacement with bioprosthetic valve   4. Essential hypertension   5. Atypical atrial flutter ([facility])   6. Educated about COVID-19 virus infection    PLAN:    In order of problems listed above:  1. Volume status appears improved.  We will continue 40 mg of furosemide.  Being that there is obtained today to ensure that we are not producing azotemia with this  dose. 2. No symptoms of angina and no ischemic evaluation has indicated. 3. No significant evidence of bioprosthetic valve dysfunction. 4. With diuresis, blood pressure is now near normal.  Systolic when seen last week was 170. 5. Not assessed 6. 3 W's are explained and being observed.  43-monthfollow-up with APP.  Reassess kidney function at that time.  Be met is being obtained today.   Medication Adjustments/Labs and Tests Ordered: Current medicines are reviewed at length with the patient today.  Concerns regarding medicines are outlined above.  Orders Placed This Encounter  Procedures  . Basic metabolic panel   No orders of the defined types were placed in this encounter.   Patient Instructions  Medication Instructions:  Your physician recommends that you continue on your current medications as directed. Please refer to the Current Medication list given to you today.  *If you need a refill on your cardiac medications before your next appointment, please call your pharmacy*  Lab Work: BMET today  If you have labs (blood work) drawn today and your tests are completely normal, you will receive your results only by: .Marland KitchenMyChart Message (if you have MyChart) OR . A paper copy in the mail If you have any lab test that is abnormal or we need to change your treatment, we will call you to review the results.  Testing/Procedures: None  Follow-Up: [facility].  As part of our continuing  mission to provide you with exceptional heart care, we have created designated Provider Care Teams.  These Care Teams include your primary Cardiologist (physician) and Advanced Practice Providers (APPs -  Physician Assistants and Nurse Practitioners) who all work together to provide you with the care you need, when you need it.  Your next appointment:   3 months  The format for your next appointment:   In Person  Provider:   You may see HSinclair Grooms MD or one of the following Advanced Practice Providers on your designated Care Team:    LTruitt Merle NP  LCecilie Kicks NP  JKathyrn Drown NP   Other Instructions      Signed, HSinclair Grooms MD  08/19/2019 9:42 AM    [place]

## 2019-08-17 NOTE — Telephone Encounter (Signed)
Note placed on appt that daughter can come up as pt does require assistance.

## 2019-08-17 NOTE — Telephone Encounter (Signed)
New Message     Pts wife is calling and says the pts daughter will be assisting the pt to his appt to guide him and help answer questions

## 2019-08-19 ENCOUNTER — Encounter: Payer: Self-pay | Admitting: Interventional Cardiology

## 2019-08-19 ENCOUNTER — Other Ambulatory Visit: Payer: Self-pay

## 2019-08-19 ENCOUNTER — Ambulatory Visit (INDEPENDENT_AMBULATORY_CARE_PROVIDER_SITE_OTHER): Payer: Medicare Other | Admitting: Interventional Cardiology

## 2019-08-19 VITALS — BP 128/70 | HR 71 | Ht 68.5 in | Wt 146.1 lb

## 2019-08-19 DIAGNOSIS — Z7189 Other specified counseling: Secondary | ICD-10-CM

## 2019-08-19 DIAGNOSIS — Z951 Presence of aortocoronary bypass graft: Secondary | ICD-10-CM

## 2019-08-19 DIAGNOSIS — I484 Atypical atrial flutter: Secondary | ICD-10-CM

## 2019-08-19 DIAGNOSIS — Z953 Presence of xenogenic heart valve: Secondary | ICD-10-CM | POA: Diagnosis not present

## 2019-08-19 DIAGNOSIS — I10 Essential (primary) hypertension: Secondary | ICD-10-CM

## 2019-08-19 DIAGNOSIS — I110 Hypertensive heart disease with heart failure: Secondary | ICD-10-CM | POA: Diagnosis not present

## 2019-08-19 DIAGNOSIS — I5031 Acute diastolic (congestive) heart failure: Secondary | ICD-10-CM | POA: Diagnosis not present

## 2019-08-19 LAB — BASIC METABOLIC PANEL
BUN/Creatinine Ratio: 16 (ref 10–24)
BUN: 17 mg/dL (ref 10–36)
CO2: 27 mmol/L (ref 20–29)
Calcium: 10.4 mg/dL — ABNORMAL HIGH (ref 8.6–10.2)
Chloride: 99 mmol/L (ref 96–106)
Creatinine, Ser: 1.06 mg/dL (ref 0.76–1.27)
GFR calc Af Amer: 70 mL/min/{1.73_m2} (ref >59)
GFR calc non Af Amer: 60 mL/min/{1.73_m2} (ref >59)
Glucose: 110 mg/dL — ABNORMAL HIGH (ref 65–99)
Potassium: 4.2 mmol/L (ref 3.5–5.2)
Sodium: 138 mmol/L (ref 134–144)

## 2019-08-19 NOTE — Patient Instructions (Signed)
Medication Instructions:  Your physician recommends that you continue on your current medications as directed. Please refer to the Current Medication list given to you today.  *If you need a refill on your cardiac medications before your next appointment, please call your pharmacy*  Lab Work: BMET today  If you have labs (blood work) drawn today and your tests are completely normal, you will receive your results only by: Marland Kitchen MyChart Message (if you have MyChart) OR . A paper copy in the mail If you have any lab test that is abnormal or we need to change your treatment, we will call you to review the results.  Testing/Procedures: None  Follow-Up: At [facility], you and your health needs are our priority.  As part of our continuing mission to provide you with exceptional heart care, we have created designated Provider Care Teams.  These Care Teams include your primary Cardiologist (physician) and Advanced Practice Providers (APPs -  Physician Assistants and Nurse Practitioners) who all work together to provide you with the care you need, when you need it.  Your next appointment:   3 months  The format for your next appointment:   In Person  Provider:   You may see Sinclair Grooms, MD or one of the following Advanced Practice Providers on your designated Care Team:    Truitt Merle, NP  Cecilie Kicks, NP  Kathyrn Drown, NP   Other Instructions

## 2019-10-04 ENCOUNTER — Encounter: Payer: Self-pay | Admitting: *Deleted

## 2019-10-14 ENCOUNTER — Ambulatory Visit: Payer: Medicare Other | Admitting: Cardiology

## 2019-10-28 DIAGNOSIS — Z952 Presence of prosthetic heart valve: Secondary | ICD-10-CM | POA: Diagnosis not present

## 2019-10-28 DIAGNOSIS — R4189 Other symptoms and signs involving cognitive functions and awareness: Secondary | ICD-10-CM | POA: Diagnosis not present

## 2019-10-28 DIAGNOSIS — Z0000 Encounter for general adult medical examination without abnormal findings: Secondary | ICD-10-CM | POA: Diagnosis not present

## 2019-10-28 DIAGNOSIS — E1122 Type 2 diabetes mellitus with diabetic chronic kidney disease: Secondary | ICD-10-CM | POA: Diagnosis not present

## 2019-10-28 DIAGNOSIS — K219 Gastro-esophageal reflux disease without esophagitis: Secondary | ICD-10-CM | POA: Diagnosis not present

## 2019-10-28 DIAGNOSIS — I5030 Unspecified diastolic (congestive) heart failure: Secondary | ICD-10-CM | POA: Diagnosis not present

## 2019-10-28 DIAGNOSIS — E210 Primary hyperparathyroidism: Secondary | ICD-10-CM | POA: Diagnosis not present

## 2019-10-28 DIAGNOSIS — N182 Chronic kidney disease, stage 2 (mild): Secondary | ICD-10-CM | POA: Diagnosis not present

## 2019-10-28 DIAGNOSIS — E039 Hypothyroidism, unspecified: Secondary | ICD-10-CM | POA: Diagnosis not present

## 2019-10-28 DIAGNOSIS — I482 Chronic atrial fibrillation, unspecified: Secondary | ICD-10-CM | POA: Diagnosis not present

## 2019-10-28 DIAGNOSIS — I110 Hypertensive heart disease with heart failure: Secondary | ICD-10-CM | POA: Diagnosis not present

## 2019-10-28 DIAGNOSIS — E782 Mixed hyperlipidemia: Secondary | ICD-10-CM | POA: Diagnosis not present

## 2019-11-01 ENCOUNTER — Telehealth: Payer: Self-pay | Admitting: Cardiology

## 2019-11-01 NOTE — Telephone Encounter (Signed)
Returned call back to daughter, [name].  Pt has memory loss and isn't able to get to appt without assistance.  She has been given permission to assist pt at appt 11/04/2019.  I have edited appt notes and documented.

## 2019-11-01 NOTE — Telephone Encounter (Signed)
New Message:      Pt's Daughter will need to come in with him for his appt on Friday with Cecilie Kicks. Pt have Dementia.

## 2019-11-03 ENCOUNTER — Other Ambulatory Visit: Payer: Self-pay | Admitting: *Deleted

## 2019-11-03 MED ORDER — APIXABAN 2.5 MG PO TABS: 2.5000 mg | ORAL_TABLET | Freq: Two times a day (BID) | ORAL | 1 refills | Status: DC

## 2019-11-03 NOTE — Telephone Encounter (Signed)
Prescription refill request for Eliquis received.  Last office visit: [name] 08/10/2019 Scr: 1.06, 08/19/2019 Age: 86 y.o. Weight: 66.3 kg  Per refill note on 01/27/2017 pt is to remain on Eliquis 2.5 mg BID.

## 2019-11-04 ENCOUNTER — Ambulatory Visit (INDEPENDENT_AMBULATORY_CARE_PROVIDER_SITE_OTHER): Payer: Medicare Other | Admitting: Cardiology

## 2019-11-04 ENCOUNTER — Encounter: Payer: Self-pay | Admitting: Cardiology

## 2019-11-04 ENCOUNTER — Other Ambulatory Visit: Payer: Self-pay

## 2019-11-04 VITALS — BP 122/78 | HR 62 | Ht 68.5 in | Wt 152.8 lb

## 2019-11-04 DIAGNOSIS — I5031 Acute diastolic (congestive) heart failure: Secondary | ICD-10-CM | POA: Diagnosis not present

## 2019-11-04 DIAGNOSIS — I10 Essential (primary) hypertension: Secondary | ICD-10-CM

## 2019-11-04 DIAGNOSIS — E039 Hypothyroidism, unspecified: Secondary | ICD-10-CM | POA: Diagnosis not present

## 2019-11-04 DIAGNOSIS — Z79899 Other long term (current) drug therapy: Secondary | ICD-10-CM | POA: Diagnosis not present

## 2019-11-04 DIAGNOSIS — I4892 Unspecified atrial flutter: Secondary | ICD-10-CM

## 2019-11-04 DIAGNOSIS — Z951 Presence of aortocoronary bypass graft: Secondary | ICD-10-CM

## 2019-11-04 DIAGNOSIS — Z953 Presence of xenogenic heart valve: Secondary | ICD-10-CM

## 2019-11-04 NOTE — Patient Instructions (Signed)
Medication Instructions:  Your physician recommends that you continue on your current medications as directed. Please refer to the Current Medication list given to you today.  *If you need a refill on your cardiac medications before your next appointment, please call your pharmacy*  Lab Work: TODAY:  BMET  If you have labs (blood work) drawn today and your tests are completely normal, you will receive your results only by: Marland Kitchen MyChart Message (if you have MyChart) OR . A paper copy in the mail If you have any lab test that is abnormal or we need to change your treatment, we will call you to review the results.  Testing/Procedures: None ordered  Follow-Up: At [facility], you and your health needs are our priority.  As part of our continuing mission to provide you with exceptional heart care, we have created designated Provider Care Teams.  These Care Teams include your primary Cardiologist (physician) and Advanced Practice Providers (APPs -  Physician Assistants and Nurse Practitioners) who all work together to provide you with the care you need, when you need it.  Your next appointment:   3 month(s)  The format for your next appointment:   In Person  Provider:   You may see Sinclair Grooms, MD or one of the following Advanced Practice Providers on your designated Care Team:    Truitt Merle, NP  Cecilie Kicks, NP  Kathyrn Drown, NP   Other Instructions

## 2019-11-04 NOTE — Progress Notes (Signed)
Cardiology Office Note   Date:  11/04/2019   ID:  RAVINDRA ALTSCHULER, DOB September 10, 1924, MRN [id number]  PCP:  Mayra Neer, MD  Cardiologist:  Dr. Tamala Julian    Chief Complaint  Patient presents with  . Congestive Heart Failure      History of Present Illness: [name] is a 86 y.o. male who presents for CHF  He has a hx of aortic stenosis, coronary artery disease, bioprosthetic aortic valve, atrial fibrillation/flutter, and chronic anticoagulation therapy. Chronic left pleural effusion.  He was having progressive shortness of breath when I saw him 1 week ago.  We intensify diuretic therapy.  He says he feels better and cannot actually recall how he felt 2 weeks ago or last week.  He denies orthopnea.  Lower extremity swelling is improved.  No side effects to the current medical regimen.  Since increasing the diuretic intensity, weight has decreased from 157 pounds August 09, 2021 146 pounds today. last visit 08/19/19 his acute diastolic HF had improved.  Kept on 40 mg lasix dialy  No agnina  BP stable improved once fluid decreased.   Today labs per Dr. Brigitte Pulse LDL of 60, HDL of 38 and TG 105  His A1C was 5.5   Pt feels well, no chest pain or SOB.  Sleeps on his usual 1 pillow and small pillow. Last visit he was in a wheelchair but not walking to appt.  He walks to get paper in AM which he could not do before.  He practices 3 Ws for covid, does not believe he will get vaccine.  Pt's daughter is with him.   Past Medical History:  Diagnosis Date  . Aortic stenosis 05/27/12   Severe to critical  . Arthritis    OSTEO OF KNEE  . Atrial fibrillation ([place]) 09/21/2012   Post operative after bypass surgery converting on amiodarone   . Bifascicular bundle branch block 02/09/2018  . Borderline diabetes   . BPH (benign prostatic hypertrophy)   . Coronary artery disease   . GI bleed   . Heart murmur   . Hypercholesteremia   . Hypertension   . Hyponatremia 07/23/2016  . Hypothyroidism  07/23/2016  . S/P aortic valve replacement with bioprosthetic valve    #23 bioprosthetic aortic valve December 2013   . S/P CABG (coronary artery bypass graft) 09/16/2012   Aortic valve replacement with a pericardial tissue  valve, [employer] 23 mm [id number] and coronary artery bypass grafting x3  with the left internal mammary to the left anterior descending coronary  artery, reverse saphenous vein graft to the circumflex coronary artery,  and reverse saphenous vein graft to the distal right coronary artery  with right leg endo vein harvesting.  09/17/2012   . SIADH (syndrome of inappropriate ADH production) ([facility] [place]) 07/23/2016  . Ulcer    PEPTIC ULCER DZ...DR. Watt Climes    Past Surgical History:  Procedure Laterality Date  . AORTIC VALVE REPLACEMENT  09/17/2012   Procedure: AORTIC VALVE REPLACEMENT (AVR);  Surgeon: Grace Isaac, MD;  Location: [facility];  Service: Open Heart Surgery;  Laterality: N/A;  . APPENDECTOMY    . CARDIAC VALVE REPLACEMENT  09-17-2012  . CORONARY ARTERY BYPASS GRAFT  09-17-2012   CABG x 3/ AVR  . CORONARY ARTERY BYPASS GRAFT  09/17/2012   Procedure: CORONARY ARTERY BYPASS GRAFTING (CABG);  Surgeon: Grace Isaac, MD;  Location: [facility];  Service: Open Heart Surgery;  Laterality: N/A;  CABG x three,  using left  internal mammary artery and right leg greater saphenous vein harvested endoscopically  . INTRAOPERATIVE TRANSESOPHAGEAL ECHOCARDIOGRAM  09/17/2012   Procedure: INTRAOPERATIVE TRANSESOPHAGEAL ECHOCARDIOGRAM;  Surgeon: Grace Isaac, MD;  Location: [facility];  Service: Open Heart Surgery;  Laterality: N/A;     Current Outpatient Medications  Medication Sig Dispense Refill  . apixaban (ELIQUIS) 2.5 MG TABS tablet Take 1 tablet (2.5 mg total) by mouth 2 (two) times daily. 180 tablet 1  . famotidine (PEPCID) 20 MG tablet Take 20 mg by mouth daily.    . furosemide (LASIX) 40 MG tablet Take 1 tablet (40 mg total) by mouth daily. 90 tablet 3  .  levothyroxine (SYNTHROID, LEVOTHROID) 100 MCG tablet Take 100 mcg by mouth daily before breakfast.     . metoprolol succinate (TOPROL-XL) 25 MG 24 hr tablet Take 25 mg by mouth daily.    . Multiple Vitamin (MULTIVITAMIN WITH MINERALS) TABS tablet Take 1 tablet by mouth daily.    . potassium chloride (KLOR-CON) 10 MEQ tablet Take 1 tablet (10 mEq total) by mouth daily. 90 tablet 3  . traZODone (DESYREL) 50 MG tablet Take 25-50 mg by mouth at bedtime as needed.    . vitamin C (ASCORBIC ACID) 500 MG tablet Take 500 mg by mouth daily.    . Vitamins/Minerals TABS Take by mouth.     No current facility-administered medications for this visit.    Allergies:   Patient has no known allergies.    Social History:  The patient  reports that he has never smoked. He has never used smokeless tobacco. He reports that he does not drink alcohol or use drugs.   Family History:  The patient's family history includes Dementia in his mother; Hypertension in his father.    ROS:  General:no colds or fevers, no weight changes Skin:no rashes or ulcers HEENT:no blurred vision, no congestion CV:see HPI PUL:see HPI GI:no diarrhea constipation or melena, no indigestion GU:no hematuria, no dysuria MS:no joint pain, no claudication Neuro:no syncope, no lightheadedness Endo:no diabetes, no thyroid disease Wt Readings from Last 3 Encounters:  11/04/19 152 lb 12.8 oz (69.3 kg)  08/19/19 146 lb 1.9 oz (66.3 kg)  08/10/19 157 lb 12.8 oz (71.6 kg)     PHYSICAL EXAM: VS:  BP 122/78   Pulse 62   Ht 5' 8.5" (1.74 m)   Wt 152 lb 12.8 oz (69.3 kg)   SpO2 99%   BMI 22.90 kg/m  , BMI Body mass index is 22.9 kg/m. General:Pleasant affect, NAD Skin:Warm and dry, brisk capillary refill HEENT:normocephalic, sclera clear, mucus membranes moist Neck:supple, no JVD, no bruits  Heart:S1S2 RRR rapid at times with soft systolic murmur 2/6, no gallup, rub or click Lungs:clear without rales, rhonchi, or wheezes [id number],  non tender, + BS, do not palpate liver spleen or masses Ext:no lower ext edema, 2+ pedal pulses, 2+ radial pulses Neuro:alert and oriented X 3, MAE, follows commands, + facial symmetry    EKG:  EKG is no  ordered today. The ekg last done 08/09/20  Recent Labs: 08/10/2019: ALT 14; Hemoglobin 11.1; NT-Pro BNP 6,202; Platelets 215 08/19/2019: BUN 17; Creatinine, Ser 1.06; Potassium 4.2; Sodium 138    Lipid Panel No results found for: CHOL, TRIG, HDL, CHOLHDL, VLDL, LDLCALC, LDLDIRECT     Other studies Reviewed: Additional studies/ records that were reviewed today include: . Echo 03/30/15 Study Conclusions   - Left ventricle: The cavity size was normal. Wall thickness was  increased in a pattern of moderate LVH.  There was focal basal  hypertrophy. Systolic function was normal. The estimated ejection  fraction was in the range of 50% to 55%. Wall motion was normal;  there were no regional wall motion abnormalities.  - Aortic valve: A bioprosthesis was present. There was mild  regurgitation.  - Mitral valve: Severely calcified annulus. There was mild  regurgitation.  - Left atrium: The atrium was moderately dilated.    48 hr Holter 11/2016  NSR with first degree AV block  Occasional Mobitz 1 second degree AV block   NSR with 1 decree AV block No atrial fibrillation.   ASSESSMENT AND PLAN:  1.  Acute diastolic HF resolved on lasix 40 mg daily will check BMP to eval Cr. And K+ - he is able to walk in for appt. fels much better, if labs abnormal may need to decrease lasix but would not stop.  He does eat some salt though tries not to do so.  2.  CAD with CABG no angina stable on BB  3.  S/p AVR with bioprosthetic valve last echo stable.   4.  HTN controlled now volume decreased on above meds  5.  Atrial flutter occ last EKG may have been flutter, pt is on eliquis.  He has no awareness of HR.  Today on exam, regular initially then faster.   6.   anticoagulation stable on eliquis with no bleeding.   7.  Hypothyroid will check TSH today - prevent 2 sticks and send results to PCP.  Follow up with Dr. Tamala Julian in 3 months.        Current medicines are reviewed with the patient today.  The patient Has no concerns regarding medicines.  The following changes have been made:  See above Labs/ tests ordered today include:see above  Disposition:   FU:  see above  Signed, Cecilie Kicks, NP  11/04/2019 3:08 PM    [facility] [place], [place], [place], [place] Phone: [phone]; Fax: [phone]

## 2019-11-05 LAB — BASIC METABOLIC PANEL
BUN/Creatinine Ratio: 22 (ref 10–24)
BUN: 26 mg/dL (ref 10–36)
CO2: 23 mmol/L (ref 20–29)
Calcium: 10.3 mg/dL — ABNORMAL HIGH (ref 8.6–10.2)
Chloride: 105 mmol/L (ref 96–106)
Creatinine, Ser: 1.18 mg/dL (ref 0.76–1.27)
GFR calc Af Amer: 61 mL/min/{1.73_m2} (ref >59)
GFR calc non Af Amer: 53 mL/min/{1.73_m2} — ABNORMAL LOW (ref >59)
Glucose: 142 mg/dL — ABNORMAL HIGH (ref 65–99)
Potassium: 5.3 mmol/L — ABNORMAL HIGH (ref 3.5–5.2)
Sodium: 144 mmol/L (ref 134–144)

## 2019-11-05 LAB — TSH: TSH: 2.44 u[IU]/mL (ref 0.450–4.500)

## 2019-11-07 ENCOUNTER — Telehealth: Payer: Self-pay | Admitting: *Deleted

## 2019-11-07 DIAGNOSIS — Z79899 Other long term (current) drug therapy: Secondary | ICD-10-CM

## 2019-11-07 NOTE — Telephone Encounter (Signed)
-----   Message from Belva Crome, MD sent at 11/05/2019  6:34 PM EST ----- Let the patient know the potassium is a little high. Stop K dur. BMET 1 -2 weeks A copy will be sent to Mayra Neer, MD

## 2019-11-21 ENCOUNTER — Other Ambulatory Visit: Payer: Medicare Other

## 2019-11-21 ENCOUNTER — Other Ambulatory Visit: Payer: Self-pay

## 2019-11-21 DIAGNOSIS — Z79899 Other long term (current) drug therapy: Secondary | ICD-10-CM | POA: Diagnosis not present

## 2019-11-22 LAB — BASIC METABOLIC PANEL
BUN/Creatinine Ratio: 20 (ref 10–24)
BUN: 23 mg/dL (ref 10–36)
CO2: 26 mmol/L (ref 20–29)
Calcium: 9.9 mg/dL (ref 8.6–10.2)
Chloride: 104 mmol/L (ref 96–106)
Creatinine, Ser: 1.13 mg/dL (ref 0.76–1.27)
GFR calc Af Amer: 64 mL/min/{1.73_m2} (ref >59)
GFR calc non Af Amer: 56 mL/min/{1.73_m2} — ABNORMAL LOW (ref >59)
Glucose: 118 mg/dL — ABNORMAL HIGH (ref 65–99)
Potassium: 4.3 mmol/L (ref 3.5–5.2)
Sodium: 142 mmol/L (ref 134–144)

## 2020-01-16 ENCOUNTER — Telehealth: Payer: Self-pay | Admitting: Interventional Cardiology

## 2020-01-16 NOTE — Telephone Encounter (Signed)
New Message    Pts wife is calling and says the pts daughter will need to assist him to his appt  Because he has Dementia     Please advise

## 2020-01-16 NOTE — Telephone Encounter (Signed)
Spoke with wife and made her aware ok for daughter to come up.

## 2020-01-19 NOTE — Progress Notes (Signed)
Cardiology Office Note:    Date:  01/20/2020   ID:  [name], DOB 1924-06-22, MRN [id number]  PCP:  Mayra Neer, MD  Cardiologist:  Sinclair Grooms, MD   Referring MD: Mayra Neer, MD   Chief Complaint  Patient presents with  . Congestive Heart Failure    History of Present Illness:    [name] is a 86 y.o. male with a hx of aortic stenosis, coronary artery disease, bioprosthetic aortic valve, atrial fibrillation/flutter, and chronic anticoagulation therapy. Chronic left pleural effusion.  Feels okay. Breath  Sleeping well. No orthopnea.  Breathing is better. No chest pain, orthopnea, or PND.  He does not note lower extremity swelling.  He has not had angina.  Past Medical History:  Diagnosis Date  . Aortic stenosis 05/27/12   Severe to critical  . Arthritis    OSTEO OF KNEE  . Atrial fibrillation ([place]) 09/21/2012   Post operative after bypass surgery converting on amiodarone   . Bifascicular bundle branch block 02/09/2018  . Borderline diabetes   . BPH (benign prostatic hypertrophy)   . Coronary artery disease   . GI bleed   . Heart murmur   . Hypercholesteremia   . Hypertension   . Hyponatremia 07/23/2016  . Hypothyroidism 07/23/2016  . S/P aortic valve replacement with bioprosthetic valve    #23 bioprosthetic aortic valve December 2013   . S/P CABG (coronary artery bypass graft) 09/16/2012   Aortic valve replacement with a pericardial tissue  valve, [employer] 23 mm [id number] and coronary artery bypass grafting x3  with the left internal mammary to the left anterior descending coronary  artery, reverse saphenous vein graft to the circumflex coronary artery,  and reverse saphenous vein graft to the distal right coronary artery  with right leg endo vein harvesting.  09/17/2012   . SIADH (syndrome of inappropriate ADH production) ([place]) 07/23/2016  . Ulcer    PEPTIC ULCER DZ...DR. Watt Climes    Past Surgical History:  Procedure Laterality  Date  . AORTIC VALVE REPLACEMENT  09/17/2012   Procedure: AORTIC VALVE REPLACEMENT (AVR);  Surgeon: Grace Isaac, MD;  Location: [facility];  Service: Open Heart Surgery;  Laterality: N/A;  . APPENDECTOMY    . CARDIAC VALVE REPLACEMENT  09-17-2012  . CORONARY ARTERY BYPASS GRAFT  09-17-2012   CABG x 3/ AVR  . CORONARY ARTERY BYPASS GRAFT  09/17/2012   Procedure: CORONARY ARTERY BYPASS GRAFTING (CABG);  Surgeon: Grace Isaac, MD;  Location: [facility];  Service: Open Heart Surgery;  Laterality: N/A;  CABG x three,  using left internal mammary artery and right leg greater saphenous vein harvested endoscopically  . INTRAOPERATIVE TRANSESOPHAGEAL ECHOCARDIOGRAM  09/17/2012   Procedure: INTRAOPERATIVE TRANSESOPHAGEAL ECHOCARDIOGRAM;  Surgeon: Grace Isaac, MD;  Location: [facility];  Service: Open Heart Surgery;  Laterality: N/A;    Current Medications: Current Meds  Medication Sig  . apixaban (ELIQUIS) 2.5 MG TABS tablet Take 1 tablet (2.5 mg total) by mouth 2 (two) times daily.  . famotidine (PEPCID) 20 MG tablet Take 20 mg by mouth daily.  . furosemide (LASIX) 40 MG tablet Take 1 tablet (40 mg total) by mouth daily.  Marland Kitchen levothyroxine (SYNTHROID, LEVOTHROID) 100 MCG tablet Take 100 mcg by mouth daily before breakfast.   . metoprolol succinate (TOPROL-XL) 25 MG 24 hr tablet Take 25 mg by mouth daily.  . Multiple Vitamin (MULTIVITAMIN WITH MINERALS) TABS tablet Take 1 tablet by mouth daily.  . vitamin C (  ASCORBIC ACID) 500 MG tablet Take 500 mg by mouth daily.  . Vitamins/Minerals TABS Take by mouth.     Allergies:   Patient has no known allergies.   Social History   Socioeconomic History  . Marital status: Married    Spouse name: Not on file  . Number of children: Not on file  . Years of education: Not on file  . Highest education level: Not on file  Occupational History  . Not on file  Tobacco Use  . Smoking status: Never Smoker  . Smokeless tobacco: Never Used  Substance and  Sexual Activity  . Alcohol use: No  . Drug use: No  . Sexual activity: Not on file  Other Topics Concern  . Not on file  Social History Narrative  . Not on file   Social Determinants of Health   Financial Resource Strain:   . Difficulty of Paying Living Expenses:   Food Insecurity:   . Worried About Charity fundraiser in the Last Year:   . Arboriculturist in the Last Year:   Transportation Needs:   . Film/video editor (Medical):   Marland Kitchen Lack of Transportation (Non-Medical):   Physical Activity:   . Days of Exercise per Week:   . Minutes of Exercise per Session:   Stress:   . Feeling of Stress :   Social Connections:   . Frequency of Communication with Friends and Family:   . Frequency of Social Gatherings with Friends and Family:   . Attends Religious Services:   . Active Member of Clubs or Organizations:   . Attends Archivist Meetings:   Marland Kitchen Marital Status:      Family History: The patient's family history includes Dementia in his mother; Hypertension in his father.  ROS:   Please see the history of present illness.    Appetite is stable.  Daughter states he sleeps an awful lot.  He has no complaints.  Appetite has been good.  Weight is been stable.  He is able to take his garbage to the road.  He is stable with reference to mobility.  They wonder if his thyroid is under control since he has not seen primary care in over a year.  All other systems reviewed and are negative.  EKGs/Labs/Other Studies Reviewed:    The following studies were reviewed today: No new imaging data  EKG:  EKG not repeated for today's visit  Recent Labs: 08/10/2019: ALT 14; Hemoglobin 11.1; NT-Pro BNP 6,202; Platelets 215 11/04/2019: TSH 2.440 11/21/2019: BUN 23; Creatinine, Ser 1.13; Potassium 4.3; Sodium 142  Recent Lipid Panel No results found for: CHOL, TRIG, HDL, CHOLHDL, VLDL, LDLCALC, LDLDIRECT  Physical Exam:    VS:  BP (!) 158/72   Pulse 85   Ht 5' 8.5" (1.74 m)   Wt  154 lb 3.2 oz (69.9 kg)   SpO2 95%   BMI 23.11 kg/m     Wt Readings from Last 3 Encounters:  01/20/20 154 lb 3.2 oz (69.9 kg)  11/04/19 152 lb 12.8 oz (69.3 kg)  08/19/19 146 lb 1.9 oz (66.3 kg)     GEN: Compatible with age. No acute distress HEENT: Normal NECK: No JVD. LYMPHATICS: No lymphadenopathy CARDIAC: Irregular RR without murmur, gallop, or edema. VASCULAR:  Normal Pulses. No bruits. RESPIRATORY:  Clear to auscultation without rales, wheezing or rhonchi  ABDOMEN: Soft, non-tender, non-distended, No pulsatile mass, MUSCULOSKELETAL: No deformity  SKIN: Warm and dry NEUROLOGIC:  Alert and oriented  x 3 PSYCHIATRIC:  Normal affect   ASSESSMENT:    1. Acute diastolic heart failure ([place])   2. Hypothyroidism, unspecified type   3. S/P CABG (coronary artery bypass graft)   4. Atrial flutter, unspecified type ([place])   5. S/P aortic valve replacement with bioprosthetic valve   6. Essential hypertension   7. HX: anticoagulation   8. Educated about COVID-19 virus infection    PLAN:    In order of problems listed above:  1. Volume status is improved.  He is able to lie flat.  Ambulating without significant dyspnea.  Continue current diuretic regimen which includes furosemide 40 mg daily. 2. TSH will be checked today. 3. No symptoms of angina.  We did not discuss secondary prevention. 4. No palpitations.  Continue anticoagulation. 5. Normally functioning bioprosthetic valve based upon exam. 6. Blood pressure is okay for his age.  He is 89 and a systolic pressure less than 160 is acceptable. 7. Continue Eliquis 2.5 mg twice daily. 8. I strongly encouraged COVID-19 vaccination.     Medication Adjustments/Labs and Tests Ordered: Current medicines are reviewed at length with the patient today.  Concerns regarding medicines are outlined above.  Orders Placed This Encounter  Procedures  . TSH  . Pro b natriuretic peptide  . CBC  . Basic metabolic panel   No orders of the  defined types were placed in this encounter.   Patient Instructions  Medication Instructions:  Your physician recommends that you continue on your current medications as directed. Please refer to the Current Medication list given to you today.  *If you need a refill on your cardiac medications before your next appointment, please call your pharmacy*   Lab Work: TSH, Pro BNP, CBC and BMET today  If you have labs (blood work) drawn today and your tests are completely normal, you will receive your results only by: Marland Kitchen MyChart Message (if you have MyChart) OR . A paper copy in the mail If you have any lab test that is abnormal or we need to change your treatment, we will call you to review the results.   Testing/Procedures: None   Follow-Up: At [facility], you and your health needs are our priority.  As part of our continuing mission to provide you with exceptional heart care, we have created designated Provider Care Teams.  These Care Teams include your primary Cardiologist (physician) and Advanced Practice Providers (APPs -  Physician Assistants and Nurse Practitioners) who all work together to provide you with the care you need, when you need it.  We recommend signing up for the patient portal called "MyChart".  Sign up information is provided on this After Visit Summary.  MyChart is used to connect with patients for Virtual Visits (Telemedicine).  Patients are able to view lab/test results, encounter notes, upcoming appointments, etc.  Non-urgent messages can be sent to your provider as well.   To learn more about what you can do with MyChart, go to [email or web address].    Your next appointment:   6 month(s)  The format for your next appointment:   In Person  Provider:   You may see Sinclair Grooms, MD or one of the following Advanced Practice Providers on your designated Care Team:    Truitt Merle, NP  Cecilie Kicks, NP  Kathyrn Drown, NP    Other  Instructions      Signed, Sinclair Grooms, MD  01/20/2020 1:13 PM    [place]

## 2020-01-20 ENCOUNTER — Encounter: Payer: Self-pay | Admitting: Interventional Cardiology

## 2020-01-20 ENCOUNTER — Other Ambulatory Visit: Payer: Self-pay

## 2020-01-20 ENCOUNTER — Ambulatory Visit (INDEPENDENT_AMBULATORY_CARE_PROVIDER_SITE_OTHER): Payer: Medicare Other | Admitting: Interventional Cardiology

## 2020-01-20 VITALS — BP 158/72 | HR 85 | Ht 68.5 in | Wt 154.2 lb

## 2020-01-20 DIAGNOSIS — Z951 Presence of aortocoronary bypass graft: Secondary | ICD-10-CM | POA: Diagnosis not present

## 2020-01-20 DIAGNOSIS — E039 Hypothyroidism, unspecified: Secondary | ICD-10-CM | POA: Diagnosis not present

## 2020-01-20 DIAGNOSIS — I10 Essential (primary) hypertension: Secondary | ICD-10-CM

## 2020-01-20 DIAGNOSIS — Z953 Presence of xenogenic heart valve: Secondary | ICD-10-CM | POA: Diagnosis not present

## 2020-01-20 DIAGNOSIS — Z7189 Other specified counseling: Secondary | ICD-10-CM

## 2020-01-20 DIAGNOSIS — Z9229 Personal history of other drug therapy: Secondary | ICD-10-CM

## 2020-01-20 DIAGNOSIS — I4892 Unspecified atrial flutter: Secondary | ICD-10-CM | POA: Diagnosis not present

## 2020-01-20 DIAGNOSIS — I5031 Acute diastolic (congestive) heart failure: Secondary | ICD-10-CM

## 2020-01-20 NOTE — Patient Instructions (Signed)
Medication Instructions:  Your physician recommends that you continue on your current medications as directed. Please refer to the Current Medication list given to you today.  *If you need a refill on your cardiac medications before your next appointment, please call your pharmacy*   Lab Work: TSH, Pro BNP, CBC and BMET today  If you have labs (blood work) drawn today and your tests are completely normal, you will receive your results only by: Marland Kitchen MyChart Message (if you have MyChart) OR . A paper copy in the mail If you have any lab test that is abnormal or we need to change your treatment, we will call you to review the results.   Testing/Procedures: None   Follow-Up: At [facility], you and your health needs are our priority.  As part of our continuing mission to provide you with exceptional heart care, we have created designated Provider Care Teams.  These Care Teams include your primary Cardiologist (physician) and Advanced Practice Providers (APPs -  Physician Assistants and Nurse Practitioners) who all work together to provide you with the care you need, when you need it.  We recommend signing up for the patient portal called "MyChart".  Sign up information is provided on this After Visit Summary.  MyChart is used to connect with patients for Virtual Visits (Telemedicine).  Patients are able to view lab/test results, encounter notes, upcoming appointments, etc.  Non-urgent messages can be sent to your provider as well.   To learn more about what you can do with MyChart, go to [email or web address].    Your next appointment:   6 month(s)  The format for your next appointment:   In Person  Provider:   You may see Sinclair Grooms, MD or one of the following Advanced Practice Providers on your designated Care Team:    Truitt Merle, NP  Cecilie Kicks, NP  Kathyrn Drown, NP    Other Instructions

## 2020-01-21 LAB — CBC
Hematocrit: 33.6 % — ABNORMAL LOW (ref 37.5–51.0)
Hemoglobin: 10.9 g/dL — ABNORMAL LOW (ref 13.0–17.7)
MCH: 30 pg (ref 26.6–33.0)
MCHC: 32.4 g/dL (ref 31.5–35.7)
MCV: 93 fL (ref 79–97)
Platelets: 180 10*3/uL (ref 150–450)
RBC: 3.63 x10E6/uL — ABNORMAL LOW (ref 4.14–5.80)
RDW: 13.8 % (ref 11.6–15.4)
WBC: 9.8 10*3/uL (ref 3.4–10.8)

## 2020-01-21 LAB — BASIC METABOLIC PANEL
BUN/Creatinine Ratio: 19 (ref 10–24)
BUN: 22 mg/dL (ref 10–36)
CO2: 25 mmol/L (ref 20–29)
Calcium: 10.2 mg/dL (ref 8.6–10.2)
Chloride: 104 mmol/L (ref 96–106)
Creatinine, Ser: 1.16 mg/dL (ref 0.76–1.27)
GFR calc Af Amer: 62 mL/min/{1.73_m2} (ref >59)
GFR calc non Af Amer: 54 mL/min/{1.73_m2} — ABNORMAL LOW (ref >59)
Glucose: 99 mg/dL (ref 65–99)
Potassium: 4.5 mmol/L (ref 3.5–5.2)
Sodium: 141 mmol/L (ref 134–144)

## 2020-01-21 LAB — TSH: TSH: 2.3 u[IU]/mL (ref 0.450–4.500)

## 2020-01-21 LAB — PRO B NATRIURETIC PEPTIDE: NT-Pro BNP: 2330 pg/mL — ABNORMAL HIGH (ref 0–486)

## 2020-02-04 ENCOUNTER — Emergency Department (HOSPITAL_COMMUNITY): Payer: Medicare Other

## 2020-02-04 ENCOUNTER — Inpatient Hospital Stay (HOSPITAL_COMMUNITY)
Admission: EM | Admit: 2020-02-04 | Discharge: 2020-02-15 | DRG: 871 | Disposition: A | Discharge status: Skilled Nursing Facility | Payer: Medicare Other | Attending: Internal Medicine | Admitting: Internal Medicine

## 2020-02-04 DIAGNOSIS — K56609 Unspecified intestinal obstruction, unspecified as to partial versus complete obstruction: Secondary | ICD-10-CM | POA: Diagnosis not present

## 2020-02-04 DIAGNOSIS — K5669 Other partial intestinal obstruction: Secondary | ICD-10-CM | POA: Diagnosis not present

## 2020-02-04 DIAGNOSIS — R6521 Severe sepsis with septic shock: Secondary | ICD-10-CM | POA: Diagnosis present

## 2020-02-04 DIAGNOSIS — L89151 Pressure ulcer of sacral region, stage 1: Secondary | ICD-10-CM | POA: Diagnosis present

## 2020-02-04 DIAGNOSIS — J13 Pneumonia due to Streptococcus pneumoniae: Secondary | ICD-10-CM | POA: Diagnosis present

## 2020-02-04 DIAGNOSIS — J9601 Acute respiratory failure with hypoxia: Secondary | ICD-10-CM | POA: Diagnosis present

## 2020-02-04 DIAGNOSIS — I10 Essential (primary) hypertension: Secondary | ICD-10-CM | POA: Diagnosis present

## 2020-02-04 DIAGNOSIS — Z7901 Long term (current) use of anticoagulants: Secondary | ICD-10-CM | POA: Diagnosis not present

## 2020-02-04 DIAGNOSIS — E039 Hypothyroidism, unspecified: Secondary | ICD-10-CM | POA: Diagnosis present

## 2020-02-04 DIAGNOSIS — I509 Heart failure, unspecified: Secondary | ICD-10-CM

## 2020-02-04 DIAGNOSIS — R059 Cough, unspecified: Secondary | ICD-10-CM

## 2020-02-04 DIAGNOSIS — M2550 Pain in unspecified joint: Secondary | ICD-10-CM | POA: Diagnosis not present

## 2020-02-04 DIAGNOSIS — M6281 Muscle weakness (generalized): Secondary | ICD-10-CM | POA: Diagnosis not present

## 2020-02-04 DIAGNOSIS — R918 Other nonspecific abnormal finding of lung field: Secondary | ICD-10-CM | POA: Diagnosis not present

## 2020-02-04 DIAGNOSIS — I4892 Unspecified atrial flutter: Secondary | ICD-10-CM | POA: Diagnosis present

## 2020-02-04 DIAGNOSIS — K219 Gastro-esophageal reflux disease without esophagitis: Secondary | ICD-10-CM | POA: Diagnosis not present

## 2020-02-04 DIAGNOSIS — J90 Pleural effusion, not elsewhere classified: Secondary | ICD-10-CM

## 2020-02-04 DIAGNOSIS — J690 Pneumonitis due to inhalation of food and vomit: Secondary | ICD-10-CM | POA: Diagnosis not present

## 2020-02-04 DIAGNOSIS — N1831 Chronic kidney disease, stage 3a: Secondary | ICD-10-CM | POA: Diagnosis present

## 2020-02-04 DIAGNOSIS — N281 Cyst of kidney, acquired: Secondary | ICD-10-CM | POA: Diagnosis present

## 2020-02-04 DIAGNOSIS — Z953 Presence of xenogenic heart valve: Secondary | ICD-10-CM

## 2020-02-04 DIAGNOSIS — Z818 Family history of other mental and behavioral disorders: Secondary | ICD-10-CM

## 2020-02-04 DIAGNOSIS — K8020 Calculus of gallbladder without cholecystitis without obstruction: Secondary | ICD-10-CM | POA: Diagnosis not present

## 2020-02-04 DIAGNOSIS — J189 Pneumonia, unspecified organism: Secondary | ICD-10-CM | POA: Diagnosis not present

## 2020-02-04 DIAGNOSIS — Z0189 Encounter for other specified special examinations: Secondary | ICD-10-CM

## 2020-02-04 DIAGNOSIS — K56600 Partial intestinal obstruction, unspecified as to cause: Secondary | ICD-10-CM | POA: Diagnosis present

## 2020-02-04 DIAGNOSIS — R112 Nausea with vomiting, unspecified: Secondary | ICD-10-CM

## 2020-02-04 DIAGNOSIS — I130 Hypertensive heart and chronic kidney disease with heart failure and stage 1 through stage 4 chronic kidney disease, or unspecified chronic kidney disease: Secondary | ICD-10-CM | POA: Diagnosis present

## 2020-02-04 DIAGNOSIS — R846 Abnormal cytological findings in specimens from respiratory organs and thorax: Secondary | ICD-10-CM | POA: Diagnosis not present

## 2020-02-04 DIAGNOSIS — Z951 Presence of aortocoronary bypass graft: Secondary | ICD-10-CM | POA: Diagnosis not present

## 2020-02-04 DIAGNOSIS — I2510 Atherosclerotic heart disease of native coronary artery without angina pectoris: Secondary | ICD-10-CM | POA: Diagnosis present

## 2020-02-04 DIAGNOSIS — N182 Chronic kidney disease, stage 2 (mild): Secondary | ICD-10-CM | POA: Diagnosis not present

## 2020-02-04 DIAGNOSIS — R1312 Dysphagia, oropharyngeal phase: Secondary | ICD-10-CM | POA: Diagnosis not present

## 2020-02-04 DIAGNOSIS — Z8249 Family history of ischemic heart disease and other diseases of the circulatory system: Secondary | ICD-10-CM

## 2020-02-04 DIAGNOSIS — Z20822 Contact with and (suspected) exposure to covid-19: Secondary | ICD-10-CM | POA: Diagnosis present

## 2020-02-04 DIAGNOSIS — R402421 Glasgow coma scale score 9-12, in the field [EMT or ambulance]: Secondary | ICD-10-CM | POA: Diagnosis not present

## 2020-02-04 DIAGNOSIS — Z79890 Hormone replacement therapy (postmenopausal): Secondary | ICD-10-CM

## 2020-02-04 DIAGNOSIS — R41841 Cognitive communication deficit: Secondary | ICD-10-CM | POA: Diagnosis not present

## 2020-02-04 DIAGNOSIS — R197 Diarrhea, unspecified: Secondary | ICD-10-CM

## 2020-02-04 DIAGNOSIS — E162 Hypoglycemia, unspecified: Secondary | ICD-10-CM | POA: Diagnosis not present

## 2020-02-04 DIAGNOSIS — A419 Sepsis, unspecified organism: Principal | ICD-10-CM | POA: Diagnosis present

## 2020-02-04 DIAGNOSIS — J320 Chronic maxillary sinusitis: Secondary | ICD-10-CM | POA: Diagnosis present

## 2020-02-04 DIAGNOSIS — K5730 Diverticulosis of large intestine without perforation or abscess without bleeding: Secondary | ICD-10-CM | POA: Diagnosis present

## 2020-02-04 DIAGNOSIS — I482 Chronic atrial fibrillation, unspecified: Secondary | ICD-10-CM | POA: Diagnosis present

## 2020-02-04 DIAGNOSIS — R2681 Unsteadiness on feet: Secondary | ICD-10-CM | POA: Diagnosis not present

## 2020-02-04 DIAGNOSIS — I4891 Unspecified atrial fibrillation: Secondary | ICD-10-CM | POA: Diagnosis present

## 2020-02-04 DIAGNOSIS — N179 Acute kidney failure, unspecified: Secondary | ICD-10-CM

## 2020-02-04 DIAGNOSIS — R05 Cough: Secondary | ICD-10-CM

## 2020-02-04 DIAGNOSIS — I340 Nonrheumatic mitral (valve) insufficiency: Secondary | ICD-10-CM | POA: Diagnosis not present

## 2020-02-04 DIAGNOSIS — K8080 Other cholelithiasis without obstruction: Secondary | ICD-10-CM | POA: Diagnosis not present

## 2020-02-04 DIAGNOSIS — I4510 Unspecified right bundle-branch block: Secondary | ICD-10-CM | POA: Diagnosis present

## 2020-02-04 DIAGNOSIS — I5031 Acute diastolic (congestive) heart failure: Secondary | ICD-10-CM | POA: Diagnosis not present

## 2020-02-04 DIAGNOSIS — Z8711 Personal history of peptic ulcer disease: Secondary | ICD-10-CM

## 2020-02-04 DIAGNOSIS — Z952 Presence of prosthetic heart valve: Secondary | ICD-10-CM

## 2020-02-04 DIAGNOSIS — R5383 Other fatigue: Secondary | ICD-10-CM

## 2020-02-04 DIAGNOSIS — R278 Other lack of coordination: Secondary | ICD-10-CM | POA: Diagnosis not present

## 2020-02-04 DIAGNOSIS — I5033 Acute on chronic diastolic (congestive) heart failure: Secondary | ICD-10-CM | POA: Diagnosis present

## 2020-02-04 DIAGNOSIS — I491 Atrial premature depolarization: Secondary | ICD-10-CM | POA: Diagnosis not present

## 2020-02-04 DIAGNOSIS — Z4682 Encounter for fitting and adjustment of non-vascular catheter: Secondary | ICD-10-CM | POA: Diagnosis not present

## 2020-02-04 DIAGNOSIS — R404 Transient alteration of awareness: Secondary | ICD-10-CM | POA: Diagnosis not present

## 2020-02-04 DIAGNOSIS — E876 Hypokalemia: Secondary | ICD-10-CM | POA: Diagnosis present

## 2020-02-04 DIAGNOSIS — Z66 Do not resuscitate: Secondary | ICD-10-CM | POA: Diagnosis present

## 2020-02-04 DIAGNOSIS — D649 Anemia, unspecified: Secondary | ICD-10-CM | POA: Diagnosis not present

## 2020-02-04 DIAGNOSIS — I480 Paroxysmal atrial fibrillation: Secondary | ICD-10-CM | POA: Diagnosis not present

## 2020-02-04 DIAGNOSIS — K4090 Unilateral inguinal hernia, without obstruction or gangrene, not specified as recurrent: Secondary | ICD-10-CM | POA: Diagnosis present

## 2020-02-04 DIAGNOSIS — Z9049 Acquired absence of other specified parts of digestive tract: Secondary | ICD-10-CM

## 2020-02-04 DIAGNOSIS — Z79899 Other long term (current) drug therapy: Secondary | ICD-10-CM

## 2020-02-04 DIAGNOSIS — F0390 Unspecified dementia without behavioral disturbance: Secondary | ICD-10-CM | POA: Diagnosis present

## 2020-02-04 DIAGNOSIS — Z7401 Bed confinement status: Secondary | ICD-10-CM | POA: Diagnosis not present

## 2020-02-04 DIAGNOSIS — J181 Lobar pneumonia, unspecified organism: Secondary | ICD-10-CM | POA: Diagnosis not present

## 2020-02-04 DIAGNOSIS — K6389 Other specified diseases of intestine: Secondary | ICD-10-CM | POA: Diagnosis not present

## 2020-02-04 DIAGNOSIS — E43 Unspecified severe protein-calorie malnutrition: Secondary | ICD-10-CM | POA: Diagnosis not present

## 2020-02-04 DIAGNOSIS — R0602 Shortness of breath: Secondary | ICD-10-CM | POA: Diagnosis not present

## 2020-02-04 DIAGNOSIS — I361 Nonrheumatic tricuspid (valve) insufficiency: Secondary | ICD-10-CM | POA: Diagnosis not present

## 2020-02-04 DIAGNOSIS — R109 Unspecified abdominal pain: Secondary | ICD-10-CM | POA: Diagnosis not present

## 2020-02-04 DIAGNOSIS — R0902 Hypoxemia: Secondary | ICD-10-CM

## 2020-02-04 DIAGNOSIS — I5030 Unspecified diastolic (congestive) heart failure: Secondary | ICD-10-CM | POA: Diagnosis not present

## 2020-02-04 DIAGNOSIS — L8990 Pressure ulcer of unspecified site, unspecified stage: Secondary | ICD-10-CM | POA: Insufficient documentation

## 2020-02-04 DIAGNOSIS — Z9889 Other specified postprocedural states: Secondary | ICD-10-CM

## 2020-02-04 DIAGNOSIS — R4182 Altered mental status, unspecified: Secondary | ICD-10-CM | POA: Diagnosis not present

## 2020-02-04 LAB — CBC WITH DIFFERENTIAL/PLATELET
Abs Immature Granulocytes: 0.05 10*3/uL (ref 0.00–0.07)
Basophils Absolute: 0.1 10*3/uL (ref 0.0–0.1)
Basophils Relative: 1 %
Eosinophils Absolute: 0 10*3/uL (ref 0.0–0.5)
Eosinophils Relative: 0 %
HCT: 34.5 % — ABNORMAL LOW (ref 39.0–52.0)
Hemoglobin: 11.3 g/dL — ABNORMAL LOW (ref 13.0–17.0)
Immature Granulocytes: 1 %
Lymphocytes Relative: 4 %
Lymphs Abs: 0.5 10*3/uL — ABNORMAL LOW (ref 0.7–4.0)
MCH: 29.8 pg (ref 26.0–34.0)
MCHC: 32.8 g/dL (ref 30.0–36.0)
MCV: 91 fL (ref 80.0–100.0)
Monocytes Absolute: 0.8 10*3/uL (ref 0.1–1.0)
Monocytes Relative: 8 %
Neutro Abs: 8.8 10*3/uL — ABNORMAL HIGH (ref 1.7–7.7)
Neutrophils Relative %: 86 %
Platelets: 255 10*3/uL (ref 150–400)
RBC: 3.79 MIL/uL — ABNORMAL LOW (ref 4.22–5.81)
RDW: 14.5 % (ref 11.5–15.5)
WBC: 10.2 10*3/uL (ref 4.0–10.5)
nRBC: 0 % (ref 0.0–0.2)

## 2020-02-04 LAB — URINALYSIS, ROUTINE W REFLEX MICROSCOPIC
Bilirubin Urine: NEGATIVE
Glucose, UA: NEGATIVE mg/dL
Hgb urine dipstick: NEGATIVE
Ketones, ur: 5 mg/dL — AB
Leukocytes,Ua: NEGATIVE
Nitrite: NEGATIVE
Protein, ur: 100 mg/dL — AB
Specific Gravity, Urine: 1.021 (ref 1.005–1.030)
pH: 5 (ref 5.0–8.0)

## 2020-02-04 LAB — RESPIRATORY PANEL BY RT PCR (FLU A&B, COVID)
Influenza A by PCR: NEGATIVE
Influenza B by PCR: NEGATIVE
SARS Coronavirus 2 by RT PCR: NEGATIVE

## 2020-02-04 LAB — LACTIC ACID, PLASMA
Lactic Acid, Venous: 2.3 mmol/L (ref 0.5–1.9)
Lactic Acid, Venous: 2.8 mmol/L (ref 0.5–1.9)
Lactic Acid, Venous: 3.6 mmol/L (ref 0.5–1.9)
Lactic Acid, Venous: 4.3 mmol/L (ref 0.5–1.9)

## 2020-02-04 LAB — MAGNESIUM: Magnesium: 2.2 mg/dL (ref 1.7–2.4)

## 2020-02-04 LAB — COMPREHENSIVE METABOLIC PANEL
ALT: 14 U/L (ref 0–44)
AST: 20 U/L (ref 15–41)
Albumin: 3.4 g/dL — ABNORMAL LOW (ref 3.5–5.0)
Alkaline Phosphatase: 53 U/L (ref 38–126)
Anion gap: 15 (ref 5–15)
BUN: 59 mg/dL — ABNORMAL HIGH (ref 8–23)
CO2: 28 mmol/L (ref 22–32)
Calcium: 10.1 mg/dL (ref 8.9–10.3)
Chloride: 92 mmol/L — ABNORMAL LOW (ref 98–111)
Creatinine, Ser: 2.24 mg/dL — ABNORMAL HIGH (ref 0.61–1.24)
GFR calc Af Amer: 28 mL/min — ABNORMAL LOW (ref >60)
GFR calc non Af Amer: 24 mL/min — ABNORMAL LOW (ref >60)
Glucose, Bld: 104 mg/dL — ABNORMAL HIGH (ref 70–99)
Potassium: 3.1 mmol/L — ABNORMAL LOW (ref 3.5–5.1)
Sodium: 135 mmol/L (ref 135–145)
Total Bilirubin: 1.7 mg/dL — ABNORMAL HIGH (ref 0.3–1.2)
Total Protein: 7.9 g/dL (ref 6.5–8.1)

## 2020-02-04 LAB — APTT: aPTT: 38 seconds — ABNORMAL HIGH (ref 24–36)

## 2020-02-04 LAB — BRAIN NATRIURETIC PEPTIDE: B Natriuretic Peptide: 315.3 pg/mL — ABNORMAL HIGH (ref 0.0–100.0)

## 2020-02-04 LAB — PROTIME-INR
INR: 1.6 — ABNORMAL HIGH (ref 0.8–1.2)
Prothrombin Time: 18.3 seconds — ABNORMAL HIGH (ref 11.4–15.2)

## 2020-02-04 MED ORDER — LEVOTHYROXINE SODIUM 100 MCG/5ML IV SOLN
100.0000 ug | Freq: Every day | INTRAVENOUS | Status: DC
  Administered 2020-02-05 – 2020-02-06 (×2): 100 ug via INTRAVENOUS
  Filled 2020-02-04 (×2): qty 5

## 2020-02-04 MED ORDER — LACTATED RINGERS IV BOLUS
1000.0000 mL | Freq: Once | INTRAVENOUS | Status: AC
  Administered 2020-02-04: 1000 mL via INTRAVENOUS

## 2020-02-04 MED ORDER — SODIUM CHLORIDE 0.9 % IV BOLUS (SEPSIS)
250.0000 mL | Freq: Once | INTRAVENOUS | Status: AC
  Administered 2020-02-04: 250 mL via INTRAVENOUS

## 2020-02-04 MED ORDER — INSULIN ASPART 100 UNIT/ML ~~LOC~~ SOLN: 0.0000 [IU] | SUBCUTANEOUS | Status: DC

## 2020-02-04 MED ORDER — VANCOMYCIN HCL IN DEXTROSE 1-5 GM/200ML-% IV SOLN
1000.0000 mg | Freq: Once | INTRAVENOUS | Status: AC
  Administered 2020-02-04: 1000 mg via INTRAVENOUS
  Filled 2020-02-04: qty 200

## 2020-02-04 MED ORDER — VANCOMYCIN HCL IN DEXTROSE 1-5 GM/200ML-% IV SOLN: 1000.0000 mg | INTRAVENOUS | Status: DC

## 2020-02-04 MED ORDER — NOREPINEPHRINE 4 MG/250ML-% IV SOLN
2.0000 ug/min | INTRAVENOUS | Status: DC
  Administered 2020-02-04: 6 ug/min via INTRAVENOUS
  Administered 2020-02-05: 1 ug/min via INTRAVENOUS
  Filled 2020-02-04: qty 250

## 2020-02-04 MED ORDER — SODIUM CHLORIDE 0.9 % IV SOLN
250.0000 mL | INTRAVENOUS | Status: DC
  Administered 2020-02-04 – 2020-02-05 (×3): 250 mL via INTRAVENOUS

## 2020-02-04 MED ORDER — HEPARIN (PORCINE) 25000 UT/250ML-% IV SOLN
1000.0000 [IU]/h | INTRAVENOUS | Status: AC
  Administered 2020-02-04: 950 [IU]/h via INTRAVENOUS
  Administered 2020-02-07: 1000 [IU]/h via INTRAVENOUS
  Filled 2020-02-04 (×3): qty 250

## 2020-02-04 MED ORDER — PANTOPRAZOLE SODIUM 40 MG IV SOLR
40.0000 mg | INTRAVENOUS | Status: DC
  Administered 2020-02-04 – 2020-02-07 (×4): 40 mg via INTRAVENOUS
  Filled 2020-02-04 (×4): qty 40

## 2020-02-04 MED ORDER — ONDANSETRON HCL 4 MG/2ML IJ SOLN
4.0000 mg | Freq: Once | INTRAMUSCULAR | Status: AC
  Administered 2020-02-04: 4 mg via INTRAVENOUS
  Filled 2020-02-04: qty 2

## 2020-02-04 MED ORDER — METRONIDAZOLE IN NACL 5-0.79 MG/ML-% IV SOLN
500.0000 mg | Freq: Three times a day (TID) | INTRAVENOUS | Status: DC
  Administered 2020-02-04 – 2020-02-05 (×2): 500 mg via INTRAVENOUS
  Filled 2020-02-04 (×2): qty 100

## 2020-02-04 MED ORDER — METRONIDAZOLE IN NACL 5-0.79 MG/ML-% IV SOLN
500.0000 mg | Freq: Once | INTRAVENOUS | Status: AC
  Administered 2020-02-04: 500 mg via INTRAVENOUS
  Filled 2020-02-04: qty 100

## 2020-02-04 MED ORDER — CHLORHEXIDINE GLUCONATE CLOTH 2 % EX PADS
6.0000 | MEDICATED_PAD | Freq: Every day | CUTANEOUS | Status: DC
  Administered 2020-02-06 – 2020-02-10 (×5): 6 via TOPICAL

## 2020-02-04 MED ORDER — ONDANSETRON HCL 4 MG/2ML IJ SOLN: 4.0000 mg | Freq: Four times a day (QID) | INTRAMUSCULAR | Status: DC | PRN

## 2020-02-04 MED ORDER — POLYETHYLENE GLYCOL 3350 17 G PO PACK: 17.0000 g | PACK | Freq: Every day | ORAL | Status: DC | PRN

## 2020-02-04 MED ORDER — POTASSIUM CHLORIDE IN NACL 40-0.9 MEQ/L-% IV SOLN
INTRAVENOUS | Status: DC
  Administered 2020-02-04: 100 mL/h via INTRAVENOUS
  Filled 2020-02-04: qty 1000

## 2020-02-04 MED ORDER — DOCUSATE SODIUM 100 MG PO CAPS: 100.0000 mg | ORAL_CAPSULE | Freq: Two times a day (BID) | ORAL | Status: DC | PRN

## 2020-02-04 MED ORDER — POTASSIUM CHLORIDE IN NACL 40-0.9 MEQ/L-% IV SOLN
INTRAVENOUS | Status: DC
  Administered 2020-02-04: 250 mL/h via INTRAVENOUS
  Filled 2020-02-04: qty 1000

## 2020-02-04 MED ORDER — SODIUM CHLORIDE 0.9 % IV SOLN
2.0000 g | Freq: Once | INTRAVENOUS | Status: AC
  Administered 2020-02-04: 2 g via INTRAVENOUS
  Filled 2020-02-04: qty 2

## 2020-02-04 MED ORDER — SODIUM CHLORIDE 0.9 % IV BOLUS (SEPSIS)
1000.0000 mL | Freq: Once | INTRAVENOUS | Status: AC
  Administered 2020-02-04: 1000 mL via INTRAVENOUS

## 2020-02-04 MED ORDER — SODIUM CHLORIDE 0.9 % IV SOLN
2.0000 g | INTRAVENOUS | Status: DC
  Filled 2020-02-04: qty 2

## 2020-02-04 NOTE — ED Notes (Signed)
Help get patient undress on the monitor patient is resting with call bell in reach 

## 2020-02-04 NOTE — Progress Notes (Signed)
Pharmacy Antibiotic Note  [name] is a 86 y.o. male admitted on 02/04/2020 with sepsis.  Pharmacy has been consulted for Vancomycin and Cefepime dosing. WBC wnl, LA 2.3. SCr 2.24 (BL ~ 1.16)   Plan: -Cefepime 2 gm IV Q 24 hours -Vancomycin 1 gm IV load followed by 1 gm IV Q 48 hours per traditional nomogram -Monitor CBC, renal fx, cultures and clinical progress -VT at SS      Temp (24hrs), Avg:102.8 F (39.3 C), Min:102.8 F (39.3 C), Max:102.8 F (39.3 C)  No results for input(s): WBC, CREATININE, LATICACIDVEN, VANCOTROUGH, VANCOPEAK, VANCORANDOM, GENTTROUGH, GENTPEAK, GENTRANDOM, TOBRATROUGH, TOBRAPEAK, TOBRARND, AMIKACINPEAK, AMIKACINTROU, AMIKACIN in the last 168 hours.  CrCl cannot be calculated (Unknown ideal weight.).    No Known Allergies  Antimicrobials this admission: Cefepime 5/1 >>  Vanc 5/1 >>   Dose adjustments this admission:  Microbiology results: 5/1 BCx:   Thank you for allowing pharmacy to be a part of this patient's care.  Albertina Parr, PharmD., BCPS, BCCCP Clinical Pharmacist Clinical phone for 02/04/20 until 10pm: [phone] If after 10pm, please refer to [facility] for unit-specific pharmacist

## 2020-02-04 NOTE — Progress Notes (Signed)
Notified provider of need to draw repeat lactic acid. 

## 2020-02-04 NOTE — Consult Note (Signed)
Reason for Consult:n/v Referring Physician: Dr Talbert Cage [name] is an 86 y.o. male.  HPI: 23 yom with mmp, is a dnr, on eliquis who has prior history of what sounds like ileocecectomy or right colectomy for appendicitis presents with n/v since Tuesday. He has not kept anything down. His family states his normal bms are 3-4 per day and they think he has still been having them. He is not sure. He has no abdominal pain right now.  He is having some sob.  He underwent evaluation in er and was found to have likely pna as well as possible sbo.  He came in today as nothing was helping this n/v.   Past Medical History:  Diagnosis Date  . Aortic stenosis 05/27/12   Severe to critical  . Arthritis    OSTEO OF KNEE  . Atrial fibrillation ([place]) 09/21/2012   Post operative after bypass surgery converting on amiodarone   . Bifascicular bundle branch block 02/09/2018  . Borderline diabetes   . BPH (benign prostatic hypertrophy)   . Coronary artery disease   . GI bleed   . Heart murmur   . Hypercholesteremia   . Hypertension   . Hyponatremia 07/23/2016  . Hypothyroidism 07/23/2016  . S/P aortic valve replacement with bioprosthetic valve    #23 bioprosthetic aortic valve December 2013   . S/P CABG (coronary artery bypass graft) 09/16/2012   Aortic valve replacement with a pericardial tissue  valve, [employer] 23 mm [id number] and coronary artery bypass grafting x3  with the left internal mammary to the left anterior descending coronary  artery, reverse saphenous vein graft to the circumflex coronary artery,  and reverse saphenous vein graft to the distal right coronary artery  with right leg endo vein harvesting.  09/17/2012   . SIADH (syndrome of inappropriate ADH production) ([place]) 07/23/2016  . Ulcer    PEPTIC ULCER DZ...DR. Watt Climes    Past Surgical History:  Procedure Laterality Date  . AORTIC VALVE REPLACEMENT  09/17/2012   Procedure: AORTIC VALVE REPLACEMENT (AVR);  Surgeon: Grace Isaac, MD;  Location: [facility];  Service: Open Heart Surgery;  Laterality: N/A;  . APPENDECTOMY    . CARDIAC VALVE REPLACEMENT  09-17-2012  . CORONARY ARTERY BYPASS GRAFT  09-17-2012   CABG x 3/ AVR  . CORONARY ARTERY BYPASS GRAFT  09/17/2012   Procedure: CORONARY ARTERY BYPASS GRAFTING (CABG);  Surgeon: Grace Isaac, MD;  Location: [facility];  Service: Open Heart Surgery;  Laterality: N/A;  CABG x three,  using left internal mammary artery and right leg greater saphenous vein harvested endoscopically  . INTRAOPERATIVE TRANSESOPHAGEAL ECHOCARDIOGRAM  09/17/2012   Procedure: INTRAOPERATIVE TRANSESOPHAGEAL ECHOCARDIOGRAM;  Surgeon: Grace Isaac, MD;  Location: [facility];  Service: Open Heart Surgery;  Laterality: N/A;    Family History  Problem Relation Age of Onset  . Hypertension Father   . Dementia Mother     Social History:  reports that he has never smoked. He has never used smokeless tobacco. He reports that he does not drink alcohol or use drugs.  Allergies: No Known Allergies  Medications: I have reviewed the patient's current medications. Includes eliquis- thinks he took today  Results for orders placed or performed during the hospital encounter of 02/04/20 (from the past 48 hour(s))  Comprehensive metabolic panel     Status: Abnormal   Collection Time: 02/04/20  1:25 PM  Result Value Ref Range   Sodium 135 135 - 145 mmol/L  Potassium 3.1 (L) 3.5 - 5.1 mmol/L   Chloride 92 (L) 98 - 111 mmol/L   CO2 28 22 - 32 mmol/L   Glucose, Bld 104 (H) 70 - 99 mg/dL    Comment: Glucose reference range applies only to samples taken after fasting for at least 8 hours.   BUN 59 (H) 8 - 23 mg/dL   Creatinine, Ser 2.24 (H) 0.61 - 1.24 mg/dL   Calcium 10.1 8.9 - 10.3 mg/dL   Total Protein 7.9 6.5 - 8.1 g/dL   Albumin 3.4 (L) 3.5 - 5.0 g/dL   AST 20 15 - 41 U/L   ALT 14 0 - 44 U/L   Alkaline Phosphatase 53 38 - 126 U/L   Total Bilirubin 1.7 (H) 0.3 - 1.2 mg/dL   GFR calc non Af Amer  24 (L) >60 mL/min   GFR calc Af Amer 28 (L) >60 mL/min   Anion gap 15 5 - 15    Comment: Performed at [facility] [address], [place], [place] [address]  Lactic acid, plasma     Status: Abnormal   Collection Time: 02/04/20  1:25 PM  Result Value Ref Range   Lactic Acid, Venous 2.3 (HH) 0.5 - 1.9 mmol/L    Comment: CRITICAL RESULT CALLED TO, READ BACK BY AND VERIFIED WITH: L.CHILTON RN @ [id number] 02/04/2020 BY C.EDENS Performed at [facility], [place] [address], [place], [place] [address]   CBC with Differential     Status: Abnormal   Collection Time: 02/04/20  1:25 PM  Result Value Ref Range   WBC 10.2 4.0 - 10.5 K/uL   RBC 3.79 (L) 4.22 - 5.81 MIL/uL   Hemoglobin 11.3 (L) 13.0 - 17.0 g/dL   HCT 34.5 (L) 39.0 - 52.0 %   MCV 91.0 80.0 - 100.0 fL   MCH 29.8 26.0 - 34.0 pg   MCHC 32.8 30.0 - 36.0 g/dL   RDW 14.5 11.5 - 15.5 %   Platelets 255 150 - 400 K/uL   nRBC 0.0 0.0 - 0.2 %   Neutrophils Relative % 86 %   Neutro Abs 8.8 (H) 1.7 - 7.7 K/uL   Lymphocytes Relative 4 %   Lymphs Abs 0.5 (L) 0.7 - 4.0 K/uL   Monocytes Relative 8 %   Monocytes Absolute 0.8 0.1 - 1.0 K/uL   Eosinophils Relative 0 %   Eosinophils Absolute 0.0 0.0 - 0.5 K/uL   Basophils Relative 1 %   Basophils Absolute 0.1 0.0 - 0.1 K/uL   Immature Granulocytes 1 %   Abs Immature Granulocytes 0.05 0.00 - 0.07 K/uL    Comment: Performed at [facility], [place] [address] [place], [place] [address]  Protime-INR     Status: Abnormal   Collection Time: 02/04/20  1:25 PM  Result Value Ref Range   Prothrombin Time 18.3 (H) 11.4 - 15.2 seconds   INR 1.6 (H) 0.8 - 1.2    Comment: (NOTE) INR goal varies based on device and disease states. Performed at [facility], [place] [address] [place], [place] [address]   Magnesium     Status: None   Collection Time: 02/04/20  1:25 PM  Result Value Ref Range   Magnesium 2.2 1.7 - 2.4 mg/dL    Comment: Performed at [facility] [address],  [place], [place] [address]  APTT     Status: Abnormal   Collection Time: 02/04/20  1:25 PM  Result Value Ref  Range   aPTT 38 (H) 24 - 36 seconds    Comment:        IF BASELINE aPTT IS ELEVATED, SUGGEST PATIENT RISK ASSESSMENT BE USED TO DETERMINE APPROPRIATE ANTICOAGULANT THERAPY. Performed at [facility], [place] [address], [place], [place] [address]   Lactic acid, plasma     Status: Abnormal   Collection Time: 02/04/20  3:31 PM  Result Value Ref Range   Lactic Acid, Venous 2.8 (HH) 0.5 - 1.9 mmol/L    Comment: CRITICAL VALUE NOTED.  VALUE IS CONSISTENT WITH PREVIOUSLY REPORTED AND CALLED VALUE. Performed at [facility], [place] [address] [place], [place] [address]   Respiratory Panel by RT PCR (Flu A&B, Covid) - Nasopharyngeal Swab     Status: None   Collection Time: 02/04/20  3:33 PM   Specimen: Nasopharyngeal Swab  Result Value Ref Range   SARS Coronavirus 2 by RT PCR NEGATIVE NEGATIVE    Comment: (NOTE) SARS-CoV-2 target nucleic acids are NOT DETECTED. The SARS-CoV-2 RNA is generally detectable in upper respiratoy specimens during the acute phase of infection. The lowest concentration of SARS-CoV-2 viral copies this assay can detect is 131 copies/mL. A negative result does not preclude SARS-Cov-2 infection and should not be used as the sole basis for treatment or other patient management decisions. A negative result may occur with  improper specimen collection/handling, submission of specimen other than nasopharyngeal swab, presence of viral mutation(s) within the areas targeted by this assay, and inadequate number of viral copies (<131 copies/mL). A negative result must be combined with clinical observations, patient history, and epidemiological information. The expected result is Negative. Fact Sheet for Patients:  PinkCheek.be Fact Sheet for Healthcare Providers:  GravelBags.it This test is not yet ap proved  or cleared by the [place] FDA and  has been authorized for detection and/or diagnosis of SARS-CoV-2 by FDA under an Emergency Use Authorization (EUA). This EUA will remain  in effect (meaning this test can be used) for the duration of the COVID-19 declaration under Section 564(b)(1) of the Act, 21 U.S.C. section 360bbb-3(b)(1), unless the authorization is terminated or revoked sooner.    Influenza A by PCR NEGATIVE NEGATIVE   Influenza B by PCR NEGATIVE NEGATIVE    Comment: (NOTE) The Xpert Xpress SARS-CoV-2/FLU/RSV assay is intended as an aid in  the diagnosis of influenza from Nasopharyngeal swab specimens and  should not be used as a sole basis for treatment. Nasal washings and  aspirates are unacceptable for Xpert Xpress SARS-CoV-2/FLU/RSV  testing. Fact Sheet for Patients: PinkCheek.be Fact Sheet for Healthcare Providers: GravelBags.it This test is not yet approved or cleared by the [place] FDA and  has been authorized for detection and/or diagnosis of SARS-CoV-2 by  FDA under an Emergency Use Authorization (EUA). This EUA will remain  in effect (meaning this test can be used) for the duration of the  Covid-19 declaration under Section 564(b)(1) of the Act, 21  U.S.C. section 360bbb-3(b)(1), unless the authorization is  terminated or revoked. Performed at [facility], [place] [address], [place], [place] [address]   Lactic acid, plasma     Status: Abnormal   Collection Time: 02/04/20  6:19 PM  Result Value Ref Range   Lactic Acid, Venous 3.6 (HH) 0.5 - 1.9 mmol/L    Comment: CRITICAL VALUE NOTED.  VALUE IS CONSISTENT WITH PREVIOUSLY REPORTED AND CALLED VALUE. Performed at [facility], [place] [address] [place], [place] [address]   Lactic  acid, plasma     Status: Abnormal   Collection Time: 02/04/20  8:00 PM  Result Value Ref Range   Lactic Acid, Venous 4.3 (HH) 0.5 - 1.9 mmol/L    Comment:  CRITICAL VALUE NOTED.  VALUE IS CONSISTENT WITH PREVIOUSLY REPORTED AND CALLED VALUE. Performed at [facility], [place] [address], [place], [place] [address]     CT ABDOMEN PELVIS WO CONTRAST  Result Date: 02/04/2020 CLINICAL DATA:  Suspect bowel obstruction. Sepsis. Previous bowel surgeries. Nausea, vomiting and diarrhea. Acute renal insufficiency. EXAM: CT ABDOMEN AND PELVIS WITHOUT CONTRAST TECHNIQUE: Multidetector CT imaging of the abdomen and pelvis was performed following the standard protocol without IV contrast. COMPARISON:  11/10/2011 FINDINGS: Lower chest: Sternotomy wires are present. Prosthetic material versus calcification over the mitral valve. Suggestion of previous aortic valve surgery. Moderate bibasilar opacification likely due to infection as well as associated basilar atelectasis. There is a small to moderate size left pleural effusion with enhancing pleura suggesting empyema. Calcification over the descending thoracic aorta. Hepatobiliary: Cholelithiasis. Liver and biliary tree are unremarkable. Pancreas: Normal. Spleen: Normal. Adrenals/Urinary Tract: Adrenal glands are normal. Kidneys normal in size without hydronephrosis or nephrolithiasis. Small cyst over the upper pole right kidney. Ureters and bladder are normal. Stomach/Bowel: Mild gastric distension. Fluid over the distal esophagus which may be due to reflux or dysmotility. Mild dilatation of the distal aspect of the duodenal C sweep. There are multiple air and fluid-filled mildly dilated jejunal loops measuring up to 3.5 cm in diameter. No definite transition point. Surgical sutures over the right colon with ileocolic anastomosis. Moderate diverticulosis of the distal descending and sigmoid colon without active inflammation. Colon is somewhat decompressed. Vascular/Lymphatic: Moderate calcified plaque over the abdominal aorta which is normal in caliber. No adenopathy. Reproductive: Normal. Other: Small right inguinal hernia  containing a short segment of small bowel. Small amount of free fluid over the right pericolic gutter. No significant focal inflammatory change. No free peritoneal air. Musculoskeletal: Degenerative changes of the spine and hips. IMPRESSION: 1. Multiple dilated jejunal loops with somewhat decompressed colon. No transition point identified. Small amount of free fluid over the right pericolic gutter. Findings may be due to early/partial small bowel obstruction versus ileus. 2. Moderate airspace opacification over the mid to lower right lung and left base likely due to infection with associated basilar atelectasis. Moderate size left effusion with enhancing pleura suggesting empyema. 3.  Cholelithiasis. 4.  Colonic diverticulosis. 5.  Small upper pole right renal cyst. 6.  Aortic Atherosclerosis (ICD10-I70.0). 7.  Right inguinal hernia containing a short segment of small bowel. Electronically Signed   By: Marin Olp M.D.   On: 02/04/2020 17:07   CT Head Wo Contrast  Result Date: 02/04/2020 CLINICAL DATA:  Altered mental status. EXAM: CT HEAD WITHOUT CONTRAST TECHNIQUE: Contiguous axial images were obtained from the base of the skull through the vertex without intravenous contrast. COMPARISON:  CT dated 07/08/2019. FINDINGS: Brain: No evidence of acute infarction, hemorrhage, hydrocephalus, extra-axial collection or mass lesion/mass effect. Atrophy and chronic microvascular ischemic changes are again noted. Vascular: No hyperdense vessel or unexpected calcification. Skull: Normal. Negative for fracture or focal lesion. Sinuses/Orbits: This complete opacification of the right maxillary sinus which is likely chronic. The remaining paranasal sinuses and mastoid air cells are essentially clear. Other: None. IMPRESSION: 1. No acute intracranial abnormality. 2. Atrophy and chronic microvascular ischemic changes. 3. Chronic right maxillary sinusitis. Electronically Signed   By: Constance Holster M.D.   On: 02/04/2020  16:55  DG Chest Portable 1 View  Result Date: 02/04/2020 CLINICAL DATA:  86 year old male with a history of tachycardia EXAM: PORTABLE CHEST 1 VIEW COMPARISON:  06/22/2019 FINDINGS: Cardiomediastinal silhouette unchanged in size and contour with surgical changes of median sternotomy, CABG, aortic valve repair. Similar appearance of mitral calcifications. Soft tissues of the chin overlying the upper chest limit evaluation, however, no evidence of pneumothorax. Interlobular septal thickening, similar to the prior. Blunting of the left costophrenic sulcus. Hazy opacities at the bilateral lung bases though improved from the prior. IMPRESSION: Although improved aeration compared to the plain film of 06/22/2019, there is evidence of acute CHF and bilateral small pleural effusions. Surgical changes of median sternotomy, CABG, aortic valve repair. Electronically Signed   By: Corrie Mckusick D.O.   On: 02/04/2020 14:44    Review of Systems  Unable to perform ROS: Age   Blood pressure (!) 83/43, pulse (!) 101, temperature 100.3 F (37.9 C), temperature source Axillary, resp. rate (!) 24, SpO2 98 %. Physical Exam  HENT:  Head: Normocephalic and atraumatic.  Right Ear: External ear normal.  Left Ear: External ear normal.  Nose: Nose normal.  Mouth/Throat: Oropharynx is clear and moist.  Eyes: Pupils are equal, round, and reactive to light. EOM are normal. No scleral icterus.  Neck: No tracheal deviation present. No thyromegaly present.  Cardiovascular: Normal rate, regular rhythm, normal heart sounds and intact distal pulses.  Respiratory: He has wheezes. He has rales.  GI: Soft. He exhibits no distension. Bowel sounds are decreased. There is no abdominal tenderness. A hernia is present. Hernia confirmed positive in the right inguinal area (nontender, mostly reduces ).  Well healed incision  Musculoskeletal:     Cervical back: Neck supple.  Skin: He is diaphoretic.    Assessment/Plan: SBO ?  Aspiration PNA -clinically may have psbo, there is nothing concerning on his exam and complains of no pain so I dont think needs elap at this point, his lactate is elevated and this is likely from deydration (n/v for days) as well as what appears to be an aspiration pna clinically as well -agree with admission, treat pna, ng tube for sbo , will hold on sbo protocol until am -discussed there is chance he might need operation -can have pharm dvt prophylaxis -fix hypokalemia Rolm Bookbinder 02/04/2020, 8:40 PM

## 2020-02-04 NOTE — ED Provider Notes (Signed)
[facility] EMERGENCY DEPARTMENT Provider Note   CSN: [id number] Arrival date & time: 02/04/20  1259     History Chief Complaint  Patient presents with  . Altered Mental Status    [name] is a 86 y.o. male.  The history is provided by the patient, the spouse and medical records. The history is limited by the condition of the patient.  Abdominal Pain Pain location:  Generalized Pain quality: aching   Pain radiates to:  Does not radiate Pain severity:  Moderate Onset quality:  Gradual Duration:  1 day Timing:  Constant Progression:  Waxing and waning Chronicity:  New Context: not trauma   Relieved by:  Nothing Worsened by:  Palpation Ineffective treatments:  None tried Associated symptoms: chills, cough, diarrhea, fatigue, fever, nausea, shortness of breath and vomiting   Associated symptoms: no chest pain, no constipation and no dysuria   Risk factors: being elderly        Past Medical History:  Diagnosis Date  . Aortic stenosis 05/27/12   Severe to critical  . Arthritis    OSTEO OF KNEE  . Atrial fibrillation ([place]) 09/21/2012   Post operative after bypass surgery converting on amiodarone   . Bifascicular bundle branch block 02/09/2018  . Borderline diabetes   . BPH (benign prostatic hypertrophy)   . Coronary artery disease   . GI bleed   . Heart murmur   . Hypercholesteremia   . Hypertension   . Hyponatremia 07/23/2016  . Hypothyroidism 07/23/2016  . S/P aortic valve replacement with bioprosthetic valve    #23 bioprosthetic aortic valve December 2013   . S/P CABG (coronary artery bypass graft) 09/16/2012   Aortic valve replacement with a pericardial tissue  valve, [employer] 23 mm [id number] and coronary artery bypass grafting x3  with the left internal mammary to the left anterior descending coronary  artery, reverse saphenous vein graft to the circumflex coronary artery,  and reverse saphenous vein graft to the distal right  coronary artery  with right leg endo vein harvesting.  09/17/2012   . SIADH (syndrome of inappropriate ADH production) ([place]) 07/23/2016  . Ulcer    PEPTIC ULCER DZ...DR. Watt Climes    Patient Active Problem List   Diagnosis Date Noted  . Bifascicular bundle branch block 02/09/2018  . Closed fracture of right proximal humerus 09/04/2017  . Closed fracture of lower end of left radius with routine healing 12/15/2016  . SIADH (syndrome of inappropriate ADH production) ([place]) 07/23/2016  . Hyponatremia 07/23/2016  . Hypothyroidism 07/23/2016  . Atrial flutter ([place]) 01/16/2015  . Essential hypertension 01/16/2014  . Atrial fibrillation ([place]) 09/21/2012  . S/P CABG (coronary artery bypass graft) 09/16/2012    Class: Chronic  . Ulcer   . BPH (benign prostatic hypertrophy)   . Arthritis   . S/P aortic valve replacement with bioprosthetic valve     Past Surgical History:  Procedure Laterality Date  . AORTIC VALVE REPLACEMENT  09/17/2012   Procedure: AORTIC VALVE REPLACEMENT (AVR);  Surgeon: Grace Isaac, MD;  Location: [facility];  Service: Open Heart Surgery;  Laterality: N/A;  . APPENDECTOMY    . CARDIAC VALVE REPLACEMENT  09-17-2012  . CORONARY ARTERY BYPASS GRAFT  09-17-2012   CABG x 3/ AVR  . CORONARY ARTERY BYPASS GRAFT  09/17/2012   Procedure: CORONARY ARTERY BYPASS GRAFTING (CABG);  Surgeon: Grace Isaac, MD;  Location: [facility];  Service: Open Heart Surgery;  Laterality: N/A;  CABG x three,  using left internal mammary artery and right leg greater saphenous vein harvested endoscopically  . INTRAOPERATIVE TRANSESOPHAGEAL ECHOCARDIOGRAM  09/17/2012   Procedure: INTRAOPERATIVE TRANSESOPHAGEAL ECHOCARDIOGRAM;  Surgeon: Grace Isaac, MD;  Location: [facility];  Service: Open Heart Surgery;  Laterality: N/A;       Family History  Problem Relation Age of Onset  . Hypertension Father   . Dementia Mother     Social History   Tobacco Use  . Smoking status: Never Smoker  .  Smokeless tobacco: Never Used  Substance Use Topics  . Alcohol use: No  . Drug use: No    Home Medications Prior to Admission medications   Medication Sig Start Date End Date Taking? Authorizing Provider  apixaban (ELIQUIS) 2.5 MG TABS tablet Take 1 tablet (2.5 mg total) by mouth 2 (two) times daily. 11/03/19   Belva Crome, MD  famotidine (PEPCID) 20 MG tablet Take 20 mg by mouth daily.    [provider]  furosemide (LASIX) 40 MG tablet Take 1 tablet (40 mg total) by mouth daily. 08/10/19 01/20/20  Belva Crome, MD  levothyroxine (SYNTHROID, LEVOTHROID) 100 MCG tablet Take 100 mcg by mouth daily before breakfast.  11/14/16   [provider]  metoprolol succinate (TOPROL-XL) 25 MG 24 hr tablet Take 25 mg by mouth daily. 10/31/16   [provider]  Multiple Vitamin (MULTIVITAMIN WITH MINERALS) TABS tablet Take 1 tablet by mouth daily.    [provider]  vitamin C (ASCORBIC ACID) 500 MG tablet Take 500 mg by mouth daily.    [provider]  Vitamins/Minerals TABS Take by mouth.    [provider]    Allergies    Patient has no known allergies.  Review of Systems   Review of Systems  Constitutional: Positive for appetite change, chills, fatigue and fever.  HENT: Negative for congestion.   Respiratory: Positive for cough and shortness of breath. Negative for chest tightness and wheezing.   Cardiovascular: Negative for chest pain.  Gastrointestinal: Positive for abdominal distention, abdominal pain, diarrhea, nausea and vomiting. Negative for constipation.  Genitourinary: Negative for dysuria.  Musculoskeletal: Negative for back pain.  Skin: Negative for rash and wound.  Neurological: Negative for headaches.  Psychiatric/Behavioral: Positive for confusion.  All other systems reviewed and are negative.   Physical Exam Updated Vital Signs BP (!) 83/43   Pulse (!) 101   Temp 100.3 F (37.9 C) (Axillary)   Resp (!) 24   SpO2 98%    Physical Exam Vitals and nursing note reviewed.  Constitutional:      General: He is not in acute distress.    Appearance: He is well-developed. He is ill-appearing. He is not toxic-appearing or diaphoretic.  HENT:     Head: Normocephalic and atraumatic.     Right Ear: External ear normal.     Left Ear: External ear normal.     Nose: Nose normal. No congestion or rhinorrhea.     Mouth/Throat:     Mouth: Mucous membranes are dry.     Pharynx: No oropharyngeal exudate or posterior oropharyngeal erythema.  Eyes:     Extraocular Movements: Extraocular movements intact.     Conjunctiva/sclera: Conjunctivae normal.     Pupils: Pupils are equal, round, and reactive to light.  Cardiovascular:     Rate and Rhythm: Tachycardia present. Rhythm irregular.     Pulses: Normal pulses.     Heart sounds: Murmur present.  Pulmonary:     Effort: Respiratory distress  present.     Breath sounds: No stridor. Rhonchi present. No wheezing or rales.  Chest:     Chest wall: No tenderness.  Abdominal:     General: Abdomen is flat. There is no distension.     Palpations: Abdomen is soft.     Tenderness: There is abdominal tenderness. There is no right CVA tenderness, left CVA tenderness, guarding or rebound.  Musculoskeletal:     Cervical back: Normal range of motion and neck supple. No tenderness.  Skin:    General: Skin is warm.     Findings: No erythema or rash.  Neurological:     Mental Status: He is alert. He is disoriented.     Cranial Nerves: No cranial nerve deficit.     Sensory: No sensory deficit.     Motor: No weakness or abnormal muscle tone.     Deep Tendon Reflexes: Reflexes normal.  Psychiatric:        Mood and Affect: Mood normal.     ED Results / Procedures / Treatments   Labs (all labs ordered are listed, but only abnormal results are displayed) Labs Reviewed  COMPREHENSIVE METABOLIC PANEL - Abnormal; Notable for the following components:      Result Value   Potassium  3.1 (*)    Chloride 92 (*)    Glucose, Bld 104 (*)    BUN 59 (*)    Creatinine, Ser 2.24 (*)    Albumin 3.4 (*)    Total Bilirubin 1.7 (*)    GFR calc non Af Amer 24 (*)    GFR calc Af Amer 28 (*)    All other components within normal limits  LACTIC ACID, PLASMA - Abnormal; Notable for the following components:   Lactic Acid, Venous 2.3 (*)    All other components within normal limits  CBC WITH DIFFERENTIAL/PLATELET - Abnormal; Notable for the following components:   RBC 3.79 (*)    Hemoglobin 11.3 (*)    HCT 34.5 (*)    Neutro Abs 8.8 (*)    Lymphs Abs 0.5 (*)    All other components within normal limits  PROTIME-INR - Abnormal; Notable for the following components:   Prothrombin Time 18.3 (*)    INR 1.6 (*)    All other components within normal limits  APTT - Abnormal; Notable for the following components:   aPTT 38 (*)    All other components within normal limits  CULTURE, BLOOD (ROUTINE X 2)  CULTURE, BLOOD (ROUTINE X 2)  URINE CULTURE  MAGNESIUM  LACTIC ACID, PLASMA  URINALYSIS, ROUTINE W REFLEX MICROSCOPIC    EKG EKG Interpretation  Date/Time:  Saturday Feb 04 2020 13:08:19 EDT Ventricular Rate:  126 PR Interval:    QRS Duration: 145 QT Interval:  346 QTC Calculation: 501 R Axis:   150 Text Interpretation: Atrial fibrillation Ventricular premature complex Right bundle branch block When compared to prior, new Afib with RVR and RBBB> No STEMI Confirmed by Antony Blackbird [phone]) on 02/04/2020 1:13:26 PM   Radiology DG Chest Portable 1 View  Result Date: 02/04/2020 CLINICAL DATA:  86 year old male with a history of tachycardia EXAM: PORTABLE CHEST 1 VIEW COMPARISON:  06/22/2019 FINDINGS: Cardiomediastinal silhouette unchanged in size and contour with surgical changes of median sternotomy, CABG, aortic valve repair. Similar appearance of mitral calcifications. Soft tissues of the chin overlying the upper chest limit evaluation, however, no evidence of pneumothorax.  Interlobular septal thickening, similar to the prior. Blunting of the left costophrenic sulcus. Hazy opacities  at the bilateral lung bases though improved from the prior. IMPRESSION: Although improved aeration compared to the plain film of 06/22/2019, there is evidence of acute CHF and bilateral small pleural effusions. Surgical changes of median sternotomy, CABG, aortic valve repair. Electronically Signed   By: Corrie Mckusick D.O.   On: 02/04/2020 14:44    Procedures Procedures (including critical care time)  CRITICAL CARE Performed by: Gwenyth Allegra Syla Devoss Total critical care time: 40 minutes Critical care time was exclusive of separately billable procedures and treating other patients. Critical care was necessary to treat or prevent imminent or life-threatening deterioration. Critical care was time spent personally by me on the following activities: development of treatment plan with patient and/or surrogate as well as nursing, discussions with consultants, evaluation of patient's response to treatment, examination of patient, obtaining history from patient or surrogate, ordering and performing treatments and interventions, ordering and review of laboratory studies, ordering and review of radiographic studies, pulse oximetry and re-evaluation of patient's condition.   Medications Ordered in ED Medications  ceFEPIme (MAXIPIME) 2 g in sodium chloride 0.9 % 100 mL IVPB (has no administration in time range)  metroNIDAZOLE (FLAGYL) IVPB 500 mg (500 mg Intravenous New Bag/Given 02/04/20 1407)  vancomycin (VANCOCIN) IVPB 1000 mg/200 mL premix (1,000 mg Intravenous New Bag/Given 02/04/20 1403)  sodium chloride 0.9 % bolus 1,000 mL (1,000 mLs Intravenous New Bag/Given 02/04/20 1358)    And  sodium chloride 0.9 % bolus 250 mL (has no administration in time range)    ED Course  I have reviewed the triage vital signs and the nursing notes.  Pertinent labs & imaging results that were available during my  care of the patient were reviewed by me and considered in my medical decision making (see chart for details).    MDM Rules/Calculators/A&P                      [name] is a 86 y.o. male with a past medical history significant for CAD status post CABG, aortic valve replacement, atrial fibrillation/flutter, SIADH, hypothyroidism, wife reporting significant abdominal surgeries in the past including removed bowel ,and recent diagnosis of acute diastolic heart failure who presents as a code sepsis for nausea, vomiting, abdominal pain, frequent stools, and altered mental status. According to wife, patient has been feeling bad for the last 5 days or so. He has had nausea and vomiting since then as well as some confusion and fatigue. Patient has had abdominal pain and distention per the wife although patient denies pain. She reports he has had frequent stools but denies full diarrhea. She reports that he has had abdominal surgeries in the past and thinks he has had obstructions and got removed. She reports he has not had any urinary changes and has not had any trauma. She reports he has had some cough with vomiting. On arrival, patient was found to be febrile, tachycardic, tachypneic, and hypoxic. Patient was made a code sepsis with unclear etiology source initially.  On exam, abdomen is diffusely tender. Lungs are coarse with no significant wheezing. Chest is nontender. Legs are slightly edematous but nontender. Patient moving all extremities. He is oriented to person and place. He denies significant headache or neck pain or trauma. No evidence of external trauma seen on exam.  Clinically I am concerned about intra-abdominal pathology given his vital signs with sepsis and the abdominal tenderness. Will get urinalysis as well as CT scan to look for mental abscess or bowel obstruction  or other abnormality such as diverticulitis or perforation. Will get chest x-ray. Although I suspect sepsis is causing the  confusion, will get head CT as he is unable to describe all the symptoms and wife still feels he is altered.  Patient will be admitted for sepsis work-up is completed. He is currently on nonrebreather to maintain his oxygen saturations in the 90s. Based on his breath sounds and reported cough and hypoxia, I do suspect he may have pneumonia or aspiration pneumonia with the altered mental status.  Wife reports that they have filled out paperwork and they will saying they are DO NOT RESUSCITATE. They have not done any paperwork in the emergency department or in the [facility] system in 2017 the last documentation shows that he is full code. Wife confirms that he is DNR.  Anticipate admission after work-up.  2:55 PM Work-up began to return showing patient does have acute kidney injury and a low GFR.  We will switch CT scan of the abdomen pelvis to without contrast.  Chest x-ray shows concern for acute CHF.  Will be gentle with fluids as he is hypoxic on room air.  Care transferred to Dr. Wilson Singer while awaiting results of CT abdomen pelvis before admission for further management of sepsis with hypoxia, nausea, vomiting, and abdominal pain.  Care transferred and anticipate admission after work-up.   Final Clinical Impression(s) / ED Diagnoses Final diagnoses:  Sepsis, due to unspecified organism, unspecified whether acute organ dysfunction present ([place])  AKI (acute kidney injury) ([place])  Fatigue, unspecified type  Nausea vomiting and diarrhea  Cough  Hypoxia     Clinical Impression: 1. Sepsis, due to unspecified organism, unspecified whether acute organ dysfunction present ([place])   2. AKI (acute kidney injury) ([place])   3. Fatigue, unspecified type   4. Nausea vomiting and diarrhea   5. Cough   6. Hypoxia     Disposition: Care transferred to Dr. Wilson Singer while awaiting results before admission.  Per wife, patient is DNR at this time.  This note was prepared with assistance of TEFL teacher. Occasional wrong-word or sound-a-like substitutions may have occurred due to the inherent limitations of voice recognition software.      Nissim Fleischer, Gwenyth Allegra, MD 02/04/20 2004

## 2020-02-04 NOTE — Progress Notes (Signed)
Notified bedside nurse of need to draw repeat lactic acid. 

## 2020-02-04 NOTE — ED Provider Notes (Signed)
Assumed care at change of shift.  Patient presented with a picture of sepsis.  Pneumonia appears to be the source.  Has an oxygen requirement and right-sided infiltrate on imaging.  Also question of partial/developing small bowel obstruction.  Has had some vomiting recently but none since he has been in the emergency room that I am aware of.  He may need an NG tube if this persists.  Antibiotics previously ordered.  Second lactic acid is a little bit increased from the initial.  Additional IV fluids ordered.  Discussed with wife and daughter at bedside.  They understand that he is very ill.  Patient himself is very tired but seems to have at least some grasp of the situation.  He is DNR.  Will discuss with hospitalist service for admission.   Virgel Manifold, MD 02/04/20 1740

## 2020-02-04 NOTE — Consult Note (Addendum)
NAME:  [name], MRN:  [id number], DOB:  05-13-1924, LOS: 0 ADMISSION DATE:  02/04/2020, CONSULTATION DATE:  02/04/20 REFERRING MD:  Dr. Marice Potter, CHIEF COMPLAINT:  sepsis   Brief History   86 year old male with past medical history of atrial fibrillation on Eliquis, aortic stenosis, CAD s/p CABG in 2013, hypertension, hypothyroidism and dementia who presented with n/v.  Found to have ileus vs SBO and PNA.  PCCM consulted as patient required peripheral vasopressors.  History of present illness   [name] is a 86 year old male 86 year old male with past medical history of atrial fibrillation on Eliquis, aortic stenosis, CAD s/p CABG in 2013, hypertension, hypothyroidism and dementia who was brought in by family for 2 to 3 days of poor appetite and nausea vomiting.  He denies any abdominal pain or diarrhea, they had not noted any constipation and thought he had a bowel movement the day before admission.  No fevers, chills, cough, URI or urinary symptoms at home.  Pt is stays mainly at home, no known ill contacts.   In the emergency department, pt was febrile and tachycardic, CT abdomen and pelvis significant for SBO versus ileus with left lower lung infiltrate.  Labs were significant for creatinine of 2.2, potassium 3.1, lactic acid 2.8->4.3.  he was treated with broad-spectrum antibiotics and 30 cc per kg IV fluids, seen by surgery and had NG tube placed.  Secondary to uptrending  lactic acid and worsening hypotension  PCCM consulted for admission  Past Medical History   has a past medical history of Aortic stenosis (05/27/12), Arthritis, Atrial fibrillation ([place]) (09/21/2012), Bifascicular bundle branch block (02/09/2018), Borderline diabetes, BPH (benign prostatic hypertrophy), Coronary artery disease, GI bleed, Heart murmur, Hypercholesteremia, Hypertension, Hyponatremia (07/23/2016), Hypothyroidism (07/23/2016), S/P aortic valve replacement with bioprosthetic valve, S/P CABG (coronary artery bypass  graft) (09/16/2012), SIADH (syndrome of inappropriate ADH production) ([place]) (07/23/2016), and Ulcer.   Significant Hospital Events   02/04/20 Admit to PCCM  Consults:  surgery  Procedures:    Significant Diagnostic Tests:  5/1 CT head>>no acute findings 5/1 CT abd/pelvis>>Multiple dilated jejunal loops with somewhat decompressed colon.. Findings may be due to early/partial small bowel obstruction versus ileus. Moderate airspace opacification over the mid to lower right lung and left base likely due to infection with associated basilar atelectasis. Moderate size left effusion with enhancing pleura suggesting empyema.  Micro Data:  5/1 RVP>>negative 5/1 BCx2>> 5/1 UC>> 5/1 Respiratory culture>>  Antimicrobials:  Cefepime 5/1- Flagyl 5/1- Vancomycin 5/1-  Interim history/subjective:  Patient remains awake and responsive, oriented to person  Objective   Blood pressure 111/67, pulse 97, temperature 100.3 F (37.9 C), temperature source Axillary, resp. rate (!) 34, SpO2 96 %.        Intake/Output Summary (Last 24 hours) at 02/04/2020 2244 Last data filed at 02/04/2020 2151 Gross per 24 hour  Intake 3414.5 ml  Output --  Net 3414.5 ml   There were no vitals filed for this visit.  General: Elderly, well-nourished male in no acute distress HEENT: MM pink/moist Neuro: Awake, conversational and following commands though oriented only to  person CV: s1s2 regular rate and rhythm, no m/r/g PULM: Bilateral lower lobe crackles oxygen saturations greater than 94% on 10 L nasal cannula GI: soft, bsx4 active  Extremities: warm/dry, 1+ edema  Skin: no rashes or lesions   Resolved Hospital Problem list     Assessment & Plan:   Septic shock and Acute hypoxic respiratory failure likely secondary to LLL pneumonia vs empyema -requiring 10L  [place], does not use home O2 -? Aspiration given recent n/v P: -Continue broad spectrum abx with Vanc Flagyl, Cefepime -follow blood and sputum  cultures -moderate effusion on bedside ultrasound, patient not tolerating sitting up, is stable on nasal cannula with minimal pressor requirement, decision made to monitor as he is clinically stable and ideally would give Eliquis 48hr wash out -incentive spirometer -oxygen supplementation to maintain O2 88-92% -Received almost 3 L IV fluids in the ED, continue with gentle maintenance hydration and trend lactic acid -Continue peripheral Levophed, MAP 85 and 6 mcg, if increasing requirements may need CVC -Last echo in 2016 with EF of 50 to 55% -Patient is confirmed DNR/DNI, but is comfortable with aggressive treatment up to that point including central line and noninvasive ventilation   Ileus vs. SBO -Seen by surgery, no plan for OR right now P: -NG tube to suction, n.p.o. and bowel rest  Chronic atrial fibrillation, CAD -Eliquis held in the event patient needs surgical intervention for SBO, on heparin drip -Last echo in 2016 with EF of 50 to 55% P: -Hold home metoprolol and Lasix in the setting of hypotension -Hold heparin drip later this morning in the event patient needs thoracentesis -Order echocardiogram   Acute Kidney Injury  -Likely prerenal secondary to volume depletion -Presenting creatinine 2.2, baseline 1.1 P: -Follow urine output, trend creatinine, avoid nephrotoxins -Urine creatinine and urea pending  Hypokalemia -Received 47 M EQ's and IV fluids in the ED P: -Repeat BMP in a.m., follow magnesium  Hypothyroidism -TSH 2 weeks ago within normal limits P: -Continue Synthroid, convert to IV dosing  Best practice:  Diet: N.p.o. Pain/Anxiety/Delirium protocol (if indicated): N/A VAP protocol (if indicated): N/A DVT prophylaxis: Heparin GI prophylaxis: N/A Glucose control: SSI Mobility: Bedrest Code Status: DNR/DNI Family Communication: Patient's wife and daughter updated at the bedside Disposition: ICU  Labs   CBC: Recent Labs  Lab 02/04/20 1325  WBC  10.2  NEUTROABS 8.8*  HGB 11.3*  HCT 34.5*  MCV 91.0  PLT 123456    Basic Metabolic Panel: Recent Labs  Lab 02/04/20 1325  NA 135  K 3.1*  CL 92*  CO2 28  GLUCOSE 104*  BUN 59*  CREATININE 2.24*  CALCIUM 10.1  MG 2.2   GFR: CrCl cannot be calculated (Unknown ideal weight.). Recent Labs  Lab 02/04/20 1325 02/04/20 1531 02/04/20 1819 02/04/20 2000  WBC 10.2  --   --   --   LATICACIDVEN 2.3* 2.8* 3.6* 4.3*    Liver Function Tests: Recent Labs  Lab 02/04/20 1325  AST 20  ALT 14  ALKPHOS 53  BILITOT 1.7*  PROT 7.9  ALBUMIN 3.4*   No results for input(s): LIPASE, AMYLASE in the last 168 hours. No results for input(s): AMMONIA in the last 168 hours.  ABG    Component Value Date/Time   PHART 7.374 09/17/2012 2153   PCO2ART 35.4 09/17/2012 2153   PO2ART 91.0 09/17/2012 2153   HCO3 20.8 09/17/2012 2153   TCO2 20 09/18/2012 1718   ACIDBASEDEF 4.0 (H) 09/17/2012 2153   O2SAT 97.0 09/17/2012 2153     Coagulation Profile: Recent Labs  Lab 02/04/20 1325  INR 1.6*    Cardiac Enzymes: No results for input(s): CKTOTAL, CKMB, CKMBINDEX, TROPONINI in the last 168 hours.  HbA1C: Hgb A1c MFr Bld  Date/Time Value Ref Range Status  09/16/2012 08:13 PM 6.3 (H) <5.7 % Final    Comment:    (NOTE)  According to the ADA Clinical Practice Recommendations for 2011, when HbA1c is used as a screening test:  >=6.5%   Diagnostic of Diabetes Mellitus           (if abnormal result is confirmed) 5.7-6.4%   Increased risk of developing Diabetes Mellitus References:Diagnosis and Classification of Diabetes Mellitus,Diabetes [id number] 1):S62-S69 and Standards of Medical Care in         Diabetes - 2011,Diabetes [id number] (Suppl 1):S11-S61.    CBG: No results for input(s): GLUCAP in the last 168 hours.  Review of Systems:   Negative except as noted in HPI  Past Medical History  He,  has a  past medical history of Aortic stenosis (05/27/12), Arthritis, Atrial fibrillation ([place]) (09/21/2012), Bifascicular bundle branch block (02/09/2018), Borderline diabetes, BPH (benign prostatic hypertrophy), Coronary artery disease, GI bleed, Heart murmur, Hypercholesteremia, Hypertension, Hyponatremia (07/23/2016), Hypothyroidism (07/23/2016), S/P aortic valve replacement with bioprosthetic valve, S/P CABG (coronary artery bypass graft) (09/16/2012), SIADH (syndrome of inappropriate ADH production) ([place]) (07/23/2016), and Ulcer.   Surgical History    Past Surgical History:  Procedure Laterality Date  . AORTIC VALVE REPLACEMENT  09/17/2012   Procedure: AORTIC VALVE REPLACEMENT (AVR);  Surgeon: Grace Isaac, MD;  Location: [place];  Service: Open Heart Surgery;  Laterality: N/A;  . APPENDECTOMY    . CARDIAC VALVE REPLACEMENT  09-17-2012  . CORONARY ARTERY BYPASS GRAFT  09-17-2012   CABG x 3/ AVR  . CORONARY ARTERY BYPASS GRAFT  09/17/2012   Procedure: CORONARY ARTERY BYPASS GRAFTING (CABG);  Surgeon: Grace Isaac, MD;  Location: [facility];  Service: Open Heart Surgery;  Laterality: N/A;  CABG x three,  using left internal mammary artery and right leg greater saphenous vein harvested endoscopically  . INTRAOPERATIVE TRANSESOPHAGEAL ECHOCARDIOGRAM  09/17/2012   Procedure: INTRAOPERATIVE TRANSESOPHAGEAL ECHOCARDIOGRAM;  Surgeon: Grace Isaac, MD;  Location: [place];  Service: Open Heart Surgery;  Laterality: N/A;     Social History   reports that he has never smoked. He has never used smokeless tobacco. He reports that he does not drink alcohol or use drugs.   Family History   His family history includes Dementia in his mother; Hypertension in his father.   Allergies No Known Allergies   Home Medications  Prior to Admission medications   Medication Sig Start Date End Date Taking? Authorizing Provider  apixaban (ELIQUIS) 2.5 MG TABS tablet Take 1 tablet (2.5 mg total) by mouth 2 (two)  times daily. 11/03/19  Yes Belva Crome, MD  famotidine (PEPCID) 20 MG tablet Take 20 mg by mouth daily.   Yes [provider]  ferrous sulfate 325 (65 FE) MG tablet Take 325 mg by mouth 2 (two) times daily with a meal.   Yes [provider]  furosemide (LASIX) 40 MG tablet Take 1 tablet (40 mg total) by mouth daily. 08/10/19 02/04/20 Yes Belva Crome, MD  levothyroxine (SYNTHROID, LEVOTHROID) 100 MCG tablet Take 100 mcg by mouth daily before breakfast.  11/14/16  Yes [provider]  metoprolol succinate (TOPROL-XL) 25 MG 24 hr tablet Take 25 mg by mouth daily. 10/31/16  Yes [provider]  Multiple Vitamin (MULTIVITAMIN WITH MINERALS) TABS tablet Take 1 tablet by mouth daily.   Yes [provider]  vitamin C (ASCORBIC ACID) 500 MG tablet Take 500 mg by mouth daily.   Yes [provider]     Critical care time: 62 minutes    CRITICAL CARE Performed by: Otilio Carpen Sheyann Sulton  Total critical care time: 62 minutes  Critical care time was exclusive of separately billable procedures and treating other patients.  Critical care was necessary to treat or prevent imminent or life-threatening deterioration.  Critical care was time spent personally by me on the following activities: development of treatment plan with patient and/or surrogate as well as nursing, discussions with consultants, evaluation of patient's response to treatment, examination of patient, obtaining history from patient or surrogate, ordering and performing treatments and interventions, ordering and review of laboratory studies, ordering and review of radiographic studies, pulse oximetry and re-evaluation of patient's condition.  Otilio Carpen Christin Moline, PA-C

## 2020-02-04 NOTE — ED Notes (Signed)
Taken to CT.

## 2020-02-04 NOTE — H&P (Addendum)
[facility] History and Physical  [name] [id number] DOB: 1924/01/07 DOA: 02/04/2020  Referring EDP: Wilson Singer PCP: Mayra Neer, MD   Chief Complaint: Vomiting  HPI: [name] is a 86 y.o. male with PMH aortic stenosis, CAD, bioprosthetic aortic valve, atrial fibrillation/flutter on Eliquis, HTN and chronic left pleural effusion presented to ER with vomiting and admitted for SBO and likely aspiration PNA.   History mostly provided by wife at bedside as patient has difficulty hearing and some baseline dementia. Wife reports that patient began vomiting Tuesday night and has not eaten anything since that time until small amount of chicken broth today. She thinks he had a bowel movement yesterday but is not sure. Patient has not complained of any pain or complaints. Does report some shortness of breath and mild cough. She was concerned about the continued vomiting and difficulty keeping food down and therefore brought him to the ER. Reports he sleeps a lot during the day at [place] and has maybe slept even more this week. At baseline, he is able to walk independently. Denies headache, dizziness, fever, chills, chest pain, abdominal pain, diarrhea, constipation, dysuria, hematuria, hematochezia, melena, difficulty moving arms/legs, speech difficulty or any other complaints.  In the ED: Febrile to 102.8, tachypneic and tacycardic. BP's low normal. Patient requring 10 L HFNC. Labs remarkable for: K 3.1, Cr 2.24. Lactate 2.3>2.8>3.6>4.3. WBC 10.2, INR 18.3, Mag 2.2, PTT 38. GI consulted for possible SBP and will follow. NGT placed. Cefepime, Vanc, Flagyl for possible PNA. Zofran given. S/p 3L IVF. CXR: Although improved aeration compared to the plain film of 06/22/2019, there is evidence of acute CHF and bilateral small pleural effusions.  CT Head:  1. No acute intracranial abnormality. 2. Atrophy and chronic microvascular ischemic changes. 3. Chronic right maxillary  sinusitis.  CT Abd/Pelv: 1. Multiple dilated jejunal loops with somewhat decompressed colon. No transition point identified. Small amount of free fluid over the right pericolic gutter. Findings may be due to early/partial small bowel obstruction versus ileus. 2. Moderate airspace opacification over the mid to lower right lung and left base likely due to infection with associated basilar atelectasis. Moderate size left effusion with enhancing pleura suggesting empyema. 3.  Cholelithiasis. 4.  Colonic diverticulosis. 5.  Small upper pole right renal cyst. 6.  Aortic Atherosclerosis (ICD10-I70.0). 7.  Right inguinal hernia containing a short segment of small bowel.  Review of Systems:  All other systems negative unless noted above in HPI.   Past Medical History:  Diagnosis Date  . Aortic stenosis 05/27/12   Severe to critical  . Arthritis    OSTEO OF KNEE  . Atrial fibrillation ([place]) 09/21/2012   Post operative after bypass surgery converting on amiodarone   . Bifascicular bundle branch block 02/09/2018  . Borderline diabetes   . BPH (benign prostatic hypertrophy)   . Coronary artery disease   . GI bleed   . Heart murmur   . Hypercholesteremia   . Hypertension   . Hyponatremia 07/23/2016  . Hypothyroidism 07/23/2016  . S/P aortic valve replacement with bioprosthetic valve    #23 bioprosthetic aortic valve December 2013   . S/P CABG (coronary artery bypass graft) 09/16/2012   Aortic valve replacement with a pericardial tissue  valve, [employer] 23 mm [id number] and coronary artery bypass grafting x3  with the left internal mammary to the left anterior descending coronary  artery, reverse saphenous vein graft to the circumflex coronary artery,  and reverse saphenous vein graft to the distal  right coronary artery  with right leg endo vein harvesting.  09/17/2012   . SIADH (syndrome of inappropriate ADH production) ([place]) 07/23/2016  . Ulcer    PEPTIC ULCER DZ...DR. Watt Climes    Past Surgical History:  Procedure Laterality Date  . AORTIC VALVE REPLACEMENT  09/17/2012   Procedure: AORTIC VALVE REPLACEMENT (AVR);  Surgeon: Grace Isaac, MD;  Location: [facility];  Service: Open Heart Surgery;  Laterality: N/A;  . APPENDECTOMY    . CARDIAC VALVE REPLACEMENT  09-17-2012  . CORONARY ARTERY BYPASS GRAFT  09-17-2012   CABG x 3/ AVR  . CORONARY ARTERY BYPASS GRAFT  09/17/2012   Procedure: CORONARY ARTERY BYPASS GRAFTING (CABG);  Surgeon: Grace Isaac, MD;  Location: [facility];  Service: Open Heart Surgery;  Laterality: N/A;  CABG x three,  using left internal mammary artery and right leg greater saphenous vein harvested endoscopically  . INTRAOPERATIVE TRANSESOPHAGEAL ECHOCARDIOGRAM  09/17/2012   Procedure: INTRAOPERATIVE TRANSESOPHAGEAL ECHOCARDIOGRAM;  Surgeon: Grace Isaac, MD;  Location: [facility];  Service: Open Heart Surgery;  Laterality: N/A;   Social History:  reports that he has never smoked. He has never used smokeless tobacco. He reports that he does not drink alcohol or use drugs.  No Known Allergies  Family History  Problem Relation Age of Onset  . Hypertension Father   . Dementia Mother     Prior to Admission medications   Medication Sig Start Date End Date Taking? Authorizing Provider  apixaban (ELIQUIS) 2.5 MG TABS tablet Take 1 tablet (2.5 mg total) by mouth 2 (two) times daily. 11/03/19  Yes Belva Crome, MD  famotidine (PEPCID) 20 MG tablet Take 20 mg by mouth daily.   Yes [provider]  ferrous sulfate 325 (65 FE) MG tablet Take 325 mg by mouth 2 (two) times daily with a meal.   Yes [provider]  furosemide (LASIX) 40 MG tablet Take 1 tablet (40 mg total) by mouth daily. 08/10/19 02/04/20 Yes Belva Crome, MD  levothyroxine (SYNTHROID, LEVOTHROID) 100 MCG tablet Take 100 mcg by mouth daily before breakfast.  11/14/16  Yes [provider]  metoprolol succinate (TOPROL-XL) 25 MG 24 hr tablet Take 25 mg by mouth  daily. 10/31/16  Yes [provider]  Multiple Vitamin (MULTIVITAMIN WITH MINERALS) TABS tablet Take 1 tablet by mouth daily.   Yes [provider]  vitamin C (ASCORBIC ACID) 500 MG tablet Take 500 mg by mouth daily.   Yes [provider]   Physical Exam: Vitals:   02/04/20 1845 02/04/20 1930 02/04/20 2015 02/04/20 2045  BP: 107/70 (!) 83/43 121/61 93/62  Pulse:  (!) 101 (!) 114 (!) 109  Resp: (!) 35 (!) 24 (!) 39 (!) 25  Temp:      TempSrc:      SpO2: 95% 98% 99% 98%    Wt Readings from Last 3 Encounters:  01/20/20 69.9 kg  11/04/19 69.3 kg  08/19/19 66.3 kg    . General:  Appears calm and comfortable, but frail. Oriented to person, time (month only) and situation. Not oriented to place. At baseline per wife. . Eyes: EOMI, normal lids, irises & conjunctiva . ENT: hard of hearing  . Neck: normal ROM . Cardiovascular: Irregularly irregular rhythm, no m/r/g. No LE edema. Marland Kitchen Respiratory: Diffuse bibasilar crackles, no w/r/r. Tachypneic. . Abdomen: soft, ntnd, no rebound or guarding, hypoactive bowel sounds . Skin: no rash or induration seen on limited exam . Musculoskeletal: grossly normal tone BUE/BLE .  Psychiatric: grossly normal mood and affect, speech fluent and appropriate . Neurologic: grossly non-focal.          Labs on Admission:  Basic Metabolic Panel: Recent Labs  Lab 02/04/20 1325  NA 135  K 3.1*  CL 92*  CO2 28  GLUCOSE 104*  BUN 59*  CREATININE 2.24*  CALCIUM 10.1  MG 2.2   Liver Function Tests: Recent Labs  Lab 02/04/20 1325  AST 20  ALT 14  ALKPHOS 53  BILITOT 1.7*  PROT 7.9  ALBUMIN 3.4*   No results for input(s): LIPASE, AMYLASE in the last 168 hours. No results for input(s): AMMONIA in the last 168 hours. CBC: Recent Labs  Lab 02/04/20 1325  WBC 10.2  NEUTROABS 8.8*  HGB 11.3*  HCT 34.5*  MCV 91.0  PLT 255   Cardiac Enzymes: No results for input(s): CKTOTAL, CKMB, CKMBINDEX, TROPONINI in the last 168  hours.  BNP (last 3 results) Recent Labs    02/04/20 1325  BNP 315.3*    ProBNP (last 3 results) Recent Labs    07/25/19 1033 08/10/19 1411 01/20/20 1217  PROBNP 3,330* 6,202* 2,330*    CBG: No results for input(s): GLUCAP in the last 168 hours.  Radiological Exams on Admission: CT ABDOMEN PELVIS WO CONTRAST  Result Date: 02/04/2020 CLINICAL DATA:  Suspect bowel obstruction. Sepsis. Previous bowel surgeries. Nausea, vomiting and diarrhea. Acute renal insufficiency. EXAM: CT ABDOMEN AND PELVIS WITHOUT CONTRAST TECHNIQUE: Multidetector CT imaging of the abdomen and pelvis was performed following the standard protocol without IV contrast. COMPARISON:  11/10/2011 FINDINGS: Lower chest: Sternotomy wires are present. Prosthetic material versus calcification over the mitral valve. Suggestion of previous aortic valve surgery. Moderate bibasilar opacification likely due to infection as well as associated basilar atelectasis. There is a small to moderate size left pleural effusion with enhancing pleura suggesting empyema. Calcification over the descending thoracic aorta. Hepatobiliary: Cholelithiasis. Liver and biliary tree are unremarkable. Pancreas: Normal. Spleen: Normal. Adrenals/Urinary Tract: Adrenal glands are normal. Kidneys normal in size without hydronephrosis or nephrolithiasis. Small cyst over the upper pole right kidney. Ureters and bladder are normal. Stomach/Bowel: Mild gastric distension. Fluid over the distal esophagus which may be due to reflux or dysmotility. Mild dilatation of the distal aspect of the duodenal C sweep. There are multiple air and fluid-filled mildly dilated jejunal loops measuring up to 3.5 cm in diameter. No definite transition point. Surgical sutures over the right colon with ileocolic anastomosis. Moderate diverticulosis of the distal descending and sigmoid colon without active inflammation. Colon is somewhat decompressed. Vascular/Lymphatic: Moderate calcified  plaque over the abdominal aorta which is normal in caliber. No adenopathy. Reproductive: Normal. Other: Small right inguinal hernia containing a short segment of small bowel. Small amount of free fluid over the right pericolic gutter. No significant focal inflammatory change. No free peritoneal air. Musculoskeletal: Degenerative changes of the spine and hips. IMPRESSION: 1. Multiple dilated jejunal loops with somewhat decompressed colon. No transition point identified. Small amount of free fluid over the right pericolic gutter. Findings may be due to early/partial small bowel obstruction versus ileus. 2. Moderate airspace opacification over the mid to lower right lung and left base likely due to infection with associated basilar atelectasis. Moderate size left effusion with enhancing pleura suggesting empyema. 3.  Cholelithiasis. 4.  Colonic diverticulosis. 5.  Small upper pole right renal cyst. 6.  Aortic Atherosclerosis (ICD10-I70.0). 7.  Right inguinal hernia containing a short segment of small bowel. Electronically Signed   By: Marin Olp  M.D.   On: 02/04/2020 17:07   CT Head Wo Contrast  Result Date: 02/04/2020 CLINICAL DATA:  Altered mental status. EXAM: CT HEAD WITHOUT CONTRAST TECHNIQUE: Contiguous axial images were obtained from the base of the skull through the vertex without intravenous contrast. COMPARISON:  CT dated 07/08/2019. FINDINGS: Brain: No evidence of acute infarction, hemorrhage, hydrocephalus, extra-axial collection or mass lesion/mass effect. Atrophy and chronic microvascular ischemic changes are again noted. Vascular: No hyperdense vessel or unexpected calcification. Skull: Normal. Negative for fracture or focal lesion. Sinuses/Orbits: This complete opacification of the right maxillary sinus which is likely chronic. The remaining paranasal sinuses and mastoid air cells are essentially clear. Other: None. IMPRESSION: 1. No acute intracranial abnormality. 2. Atrophy and chronic  microvascular ischemic changes. 3. Chronic right maxillary sinusitis. Electronically Signed   By: Constance Holster M.D.   On: 02/04/2020 16:55   DG Chest Portable 1 View  Result Date: 02/04/2020 CLINICAL DATA:  86 year old male with a history of tachycardia EXAM: PORTABLE CHEST 1 VIEW COMPARISON:  06/22/2019 FINDINGS: Cardiomediastinal silhouette unchanged in size and contour with surgical changes of median sternotomy, CABG, aortic valve repair. Similar appearance of mitral calcifications. Soft tissues of the chin overlying the upper chest limit evaluation, however, no evidence of pneumothorax. Interlobular septal thickening, similar to the prior. Blunting of the left costophrenic sulcus. Hazy opacities at the bilateral lung bases though improved from the prior. IMPRESSION: Although improved aeration compared to the plain film of 06/22/2019, there is evidence of acute CHF and bilateral small pleural effusions. Surgical changes of median sternotomy, CABG, aortic valve repair. Electronically Signed   By: Corrie Mckusick D.O.   On: 02/04/2020 14:44    EKG: Independently reviewed. HR 130. Atrial fibrillation with RVR which has not been present on previous EKG's in system but patient with known Afib. RBBB present and previously present. QTc 501. No STEMI.  Assessment/Plan Principal Problem:   SBO (small bowel obstruction) (HCC) Active Problems:   S/P aortic valve replacement with bioprosthetic valve   S/P CABG (coronary artery bypass graft)   Atrial fibrillation (HCC)   Essential hypertension   Hypothyroidism   Aspiration pneumonia (HCC)   Sepsis (HCC)   AKI (acute kidney injury) ([place])   CKD (chronic kidney disease) stage 2, GFR 60-89 ml/min  86 y.o. male with PMH aortic stenosis, CAD, bioprosthetic aortic valve, atrial fibrillation/flutter on Eliquis, HTN and chronic left pleural effusion presented to ER with vomiting and admitted for SBO and likely aspiration PNA.   SBO - Vomiting since evening  of 4/27 and unable to keep foods down since that time - CTA as above with SBO vs ileus - no abdominal pain present on exam; unknown if patient has been constipated as wife believes he had a BM yesterday - GI consulted in ED and will follow; possible operation if patient does not improve - cont NPO and NGT - IVF with K ordered on admission to replace K 3.1  Likely Aspiration PNA; sepsis POA - Febrile, tachycardic and tachypneic with 10 L HFNC requirement POA - hx of repeated vomiting over past several days more suspicious for aspiration PNA - Lactate elevated; s/p 3 L in ED and will give one more liter with K replacement on admission - Last Echo with EF 50-55% in June 2016; may need repeat - patient with chronic pleural effusions and takes Lasix which complicates need for IV rehydration  - Trend Lactate until normal; currently trending up - Cont Vanc/Cefepime/Flagyl for aspiration PNA coverage  AKI  on CKD Stage 2 - Cr 2.24 on admission; likely in setting of n/v poor PO intake for several days - Ur Cr and Ur Urea ordered for FeUrea due to lasix use - Cont IVF at this time - CrCl 20 on admission  Atrial Fibrillation CAD HTN - Afib with RVR present intermittently - PTA Metoprolol held due to low BP's; may need Dilt drip or other if Afib RVR persistent - Rate currently around 100 - Hold PTA Eliquis as patient NPO and may possibly need surgery - Heparin ordered - high-risk surgical candidate  - Mag WNL   Code Status: DNRDNI. Declines chest compressions and intubation. Okay with NIV, pressors, ICU-level care.  DVT Prophylaxis: Treatment Heparin as patient takes Eliquis and currently NPO/possibly pre-procedure and needs shorter acting agent; CrCl 20 on admission Family Communication: Patient's wife at bedside Disposition Plan: Admit to Progressive Care. Patient is at high risk for further decompensation and even death due to age and co-morbidities. Anticipate discharge likely to SNF in  6-8 days.  Time spent: 70 minutes  Addendum: Notified by RN that pressures continue to decrease. Discussed case with CCM and they will see patient.  9:42 PM   Addendum: Notified by RN of continued decreasing pressures. Levophed ordered and will await CCM consult but will need ICU admission.  9:58 PM  Chauncey Mann, MD [facility] Pager [phone]

## 2020-02-04 NOTE — Progress Notes (Signed)
Notified bedside nurse of need to draw repeat lactic acid @ 1730.

## 2020-02-04 NOTE — Progress Notes (Signed)
Notified bedside nurse of need to order repeat lactic acid since third one was even higher than second. Order for lactic is already placed.

## 2020-02-04 NOTE — Progress Notes (Signed)
ANTICOAGULATION CONSULT NOTE - Initial Consult  Pharmacy Consult for heparin Indication: atrial fibrillation  No Known Allergies  Patient Measurements:  Heparin Dosing Weight: 66 kg (patient reported)   Vital Signs: Temp: 100.3 F (37.9 C) (05/01 1700) Temp Source: Axillary (05/01 1700) BP: 93/62 (05/01 2045) Pulse Rate: 109 (05/01 2045)  Labs: Recent Labs    02/04/20 1325  HGB 11.3*  HCT 34.5*  PLT 255  APTT 38*  LABPROT 18.3*  INR 1.6*  CREATININE 2.24*    CrCl cannot be calculated (Unknown ideal weight.).   Medical History: Past Medical History:  Diagnosis Date  . Aortic stenosis 05/27/12   Severe to critical  . Arthritis    OSTEO OF KNEE  . Atrial fibrillation ([place]) 09/21/2012   Post operative after bypass surgery converting on amiodarone   . Bifascicular bundle branch block 02/09/2018  . Borderline diabetes   . BPH (benign prostatic hypertrophy)   . Coronary artery disease   . GI bleed   . Heart murmur   . Hypercholesteremia   . Hypertension   . Hyponatremia 07/23/2016  . Hypothyroidism 07/23/2016  . S/P aortic valve replacement with bioprosthetic valve    #23 bioprosthetic aortic valve December 2013   . S/P CABG (coronary artery bypass graft) 09/16/2012   Aortic valve replacement with a pericardial tissue  valve, [employer] 23 mm [id number] and coronary artery bypass grafting x3  with the left internal mammary to the left anterior descending coronary  artery, reverse saphenous vein graft to the circumflex coronary artery,  and reverse saphenous vein graft to the distal right coronary artery  with right leg endo vein harvesting.  09/17/2012   . SIADH (syndrome of inappropriate ADH production) ([place]) 07/23/2016  . Ulcer    PEPTIC ULCER DZ...DR. Watt Climes    Medications:  (Not in a hospital admission)   Assessment: 60 YOM on apixaban at home is now NPO with concern for SBO. Pharmacy consulted to transition patient to IV heparin. H/H slightly low.  Plt wnl. SCr elevated at 2.24. BL aPTT 38   Of note, last dose of apixaban was last night   Goal of Therapy:  Heparin level 0.3-0.7 units/ml aPTT 66-102s seconds Monitor platelets by anticoagulation protocol: Yes   Plan:  -Start IV heparin at 950 units/hr. No bolus  -F/u AM HL -Monitor daily HL, CBC and s/s of bleeding   Albertina Parr, PharmD., BCPS, BCCCP Clinical Pharmacist Clinical phone for 02/04/20 until 10pm: [phone] If after 10pm, please refer to [facility] for unit-specific pharmacist

## 2020-02-04 NOTE — ED Triage Notes (Signed)
Patient arrived via ems; c/o nausea and vomiting since Tuesday along w/ altered mental status. EMS endorsed tachycardia and low Sp02 on arrival on scene.

## 2020-02-05 ENCOUNTER — Inpatient Hospital Stay (HOSPITAL_COMMUNITY): Payer: Medicare Other

## 2020-02-05 DIAGNOSIS — A419 Sepsis, unspecified organism: Principal | ICD-10-CM

## 2020-02-05 DIAGNOSIS — K56609 Unspecified intestinal obstruction, unspecified as to partial versus complete obstruction: Secondary | ICD-10-CM

## 2020-02-05 DIAGNOSIS — Z953 Presence of xenogenic heart valve: Secondary | ICD-10-CM

## 2020-02-05 DIAGNOSIS — R6521 Severe sepsis with septic shock: Secondary | ICD-10-CM

## 2020-02-05 DIAGNOSIS — I10 Essential (primary) hypertension: Secondary | ICD-10-CM

## 2020-02-05 DIAGNOSIS — N179 Acute kidney failure, unspecified: Secondary | ICD-10-CM

## 2020-02-05 DIAGNOSIS — J690 Pneumonitis due to inhalation of food and vomit: Secondary | ICD-10-CM | POA: Diagnosis not present

## 2020-02-05 DIAGNOSIS — I340 Nonrheumatic mitral (valve) insufficiency: Secondary | ICD-10-CM | POA: Diagnosis not present

## 2020-02-05 DIAGNOSIS — I361 Nonrheumatic tricuspid (valve) insufficiency: Secondary | ICD-10-CM

## 2020-02-05 DIAGNOSIS — N182 Chronic kidney disease, stage 2 (mild): Secondary | ICD-10-CM

## 2020-02-05 DIAGNOSIS — I480 Paroxysmal atrial fibrillation: Secondary | ICD-10-CM

## 2020-02-05 DIAGNOSIS — Z951 Presence of aortocoronary bypass graft: Secondary | ICD-10-CM

## 2020-02-05 DIAGNOSIS — J189 Pneumonia, unspecified organism: Secondary | ICD-10-CM

## 2020-02-05 LAB — CBC
HCT: 30.3 % — ABNORMAL LOW (ref 39.0–52.0)
Hemoglobin: 10 g/dL — ABNORMAL LOW (ref 13.0–17.0)
MCH: 30.7 pg (ref 26.0–34.0)
MCHC: 33 g/dL (ref 30.0–36.0)
MCV: 92.9 fL (ref 80.0–100.0)
Platelets: 217 10*3/uL (ref 150–400)
RBC: 3.26 MIL/uL — ABNORMAL LOW (ref 4.22–5.81)
RDW: 14.7 % (ref 11.5–15.5)
WBC: 13.3 10*3/uL — ABNORMAL HIGH (ref 4.0–10.5)
nRBC: 0 % (ref 0.0–0.2)

## 2020-02-05 LAB — PHOSPHORUS: Phosphorus: 3.5 mg/dL (ref 2.5–4.6)

## 2020-02-05 LAB — BASIC METABOLIC PANEL
Anion gap: 13 (ref 5–15)
BUN: 62 mg/dL — ABNORMAL HIGH (ref 8–23)
CO2: 28 mmol/L (ref 22–32)
Calcium: 9.3 mg/dL (ref 8.9–10.3)
Chloride: 97 mmol/L — ABNORMAL LOW (ref 98–111)
Creatinine, Ser: 2.17 mg/dL — ABNORMAL HIGH (ref 0.61–1.24)
GFR calc Af Amer: 29 mL/min — ABNORMAL LOW (ref >60)
GFR calc non Af Amer: 25 mL/min — ABNORMAL LOW (ref >60)
Glucose, Bld: 86 mg/dL (ref 70–99)
Potassium: 3.5 mmol/L (ref 3.5–5.1)
Sodium: 138 mmol/L (ref 135–145)

## 2020-02-05 LAB — MRSA PCR SCREENING: MRSA by PCR: NEGATIVE

## 2020-02-05 LAB — GLUCOSE, CAPILLARY
Glucose-Capillary: 68 mg/dL — ABNORMAL LOW (ref 70–99)
Glucose-Capillary: 71 mg/dL (ref 70–99)
Glucose-Capillary: 76 mg/dL (ref 70–99)
Glucose-Capillary: 80 mg/dL (ref 70–99)
Glucose-Capillary: 82 mg/dL (ref 70–99)
Glucose-Capillary: 82 mg/dL (ref 70–99)

## 2020-02-05 LAB — HEPARIN LEVEL (UNFRACTIONATED): Heparin Unfractionated: 1.03 IU/mL — ABNORMAL HIGH (ref 0.30–0.70)

## 2020-02-05 LAB — ECHOCARDIOGRAM COMPLETE
Height: 68 in
Weight: 2476.21 oz

## 2020-02-05 LAB — APTT
aPTT: 52 seconds — ABNORMAL HIGH (ref 24–36)
aPTT: 44 seconds — ABNORMAL HIGH (ref 24–36)
aPTT: 61 seconds — ABNORMAL HIGH (ref 24–36)

## 2020-02-05 LAB — LACTIC ACID, PLASMA
Lactic Acid, Venous: 3.1 mmol/L (ref 0.5–1.9)
Lactic Acid, Venous: 2.4 mmol/L (ref 0.5–1.9)

## 2020-02-05 LAB — HEMOGLOBIN A1C
Hgb A1c MFr Bld: 5.4 % (ref 4.8–5.6)
Mean Plasma Glucose: 108.28 mg/dL

## 2020-02-05 LAB — MAGNESIUM: Magnesium: 2.1 mg/dL (ref 1.7–2.4)

## 2020-02-05 LAB — CREATININE, URINE, RANDOM: Creatinine, Urine: 204.62 mg/dL

## 2020-02-05 MED ORDER — POTASSIUM CHLORIDE 10 MEQ/100ML IV SOLN
10.0000 meq | Freq: Once | INTRAVENOUS | Status: AC
  Administered 2020-02-05: 10 meq via INTRAVENOUS
  Filled 2020-02-05: qty 100

## 2020-02-05 MED ORDER — PIPERACILLIN-TAZOBACTAM 3.375 G IVPB
3.3750 g | Freq: Three times a day (TID) | INTRAVENOUS | Status: AC
  Administered 2020-02-05 – 2020-02-11 (×20): 3.375 g via INTRAVENOUS
  Filled 2020-02-05 (×23): qty 50

## 2020-02-05 MED ORDER — DEXTROSE IN LACTATED RINGERS 5 % IV SOLN: INTRAVENOUS | Status: DC

## 2020-02-05 MED ORDER — LACTATED RINGERS IV SOLN: INTRAVENOUS | Status: AC

## 2020-02-05 MED ORDER — WHITE PETROLATUM EX OINT
TOPICAL_OINTMENT | CUTANEOUS | Status: AC
  Filled 2020-02-05: qty 28.35

## 2020-02-05 NOTE — Progress Notes (Signed)
ANTICOAGULATION CONSULT NOTE - Follow Up Consult  Pharmacy Consult for IV Heparin Indication: atrial fibrillation  No Known Allergies  Patient Measurements: Height: 5\' 8"  (172.7 cm) Weight: 70.2 kg (154 lb 12.2 oz) IBW/kg (Calculated) : 68.4 Heparin Dosing Weight: 70.2 kg  Vital Signs: Temp: 98.2 F (36.8 C) (05/02 0700) Temp Source: Axillary (05/02 0700) BP: 117/59 (05/02 1000) Pulse Rate: 89 (05/02 1000)  Labs: Recent Labs    02/04/20 1325 02/05/20 0048 02/05/20 0624  HGB 11.3* 10.0*  --   HCT 34.5* 30.3*  --   PLT 255 217  --   APTT 38* 52* 44*  LABPROT 18.3*  --   --   INR 1.6*  --   --   HEPARINUNFRC  --  1.03*  --   CREATININE 2.24* 2.17*  --     Estimated Creatinine Clearance: 20.6 mL/min (A) (by C-G formula based on SCr of 2.17 mg/dL (H)).   Assessment: 86 year old male on Eliquis prior to admission (last dose 4/30 at 2000) for atrial fibrillation which is on hold currently due to SBO. Pharmacy consulted for IV Heparin.   Using aPTT to monitor due to effect of recent Eliquis on heparin level.   APTT this AM 44 (prior 52 but only 3hrs after start of infusion) - but per RN heparin was turned off at 2AM for possible thoracentesis. Discussed with Dr. Shearon Stalls, no plan for thoracentesis today and ok to resume IV Heparin. Hgb 10, platelets stable. SCr trending down.   Goal of Therapy:  Heparin level 0.3-0.7 units/ml aPTT 66 -102 seconds Monitor platelets by anticoagulation protocol: Yes   Plan:  Restart IV Heparin at 950 units/hr.  Recheck aPTT in 8 hours.  Daily aPTT and HL while on therapy.   Sloan Leiter, PharmD, BCPS, BCCCP Clinical Pharmacist Please refer to [facility] for [place] numbers 02/05/2020,11:25 AM

## 2020-02-05 NOTE — Progress Notes (Signed)
eLink Physician-Brief Progress Note Patient Name: [name] DOB: [birth year] MRN: [id number]   Date of Service  02/05/2020  HPI/Events of Note  Notified of glucose 60s-70s. NPO due to bowel obstruction. Fluids on hold due to cxr findings of congestion but clear on bedside exam.  eICU Interventions  Ordered to start D5 LR to start at 20 cc/hr. If with signs of congestion then will plan to diurese     Intervention Category Major Interventions: Other:  Judd Lien 02/05/2020, 9:24 PM

## 2020-02-05 NOTE — Progress Notes (Signed)
NAME:  [name], MRN:  [id number], DOB:  December 10, 1923, LOS: 1 ADMISSION DATE:  02/04/2020, CONSULTATION DATE:  02/04/2020 REFERRING MD:  EDP, CHIEF COMPLAINT:  nausea  Brief History   The patient is a 86 year old gentleman with a history of coronary artery disease status post CABG, aortic valve replacement, atrial fibrillation on Eliquis and hypertension and hypothyroidism.  He presented with small bowel obstruction versus ileus on 5/1.  He was admitted to the ICU for septic shock.  Consults:  General surgery  Procedures:   Significant Diagnostic Tests:  5/2 KUB demonstrates nasogastric tube in the stomach, however the side-port is at the GE junction.  Micro Data:  5/1 blood cultures no growth to date 5/1 MRSA PCR negative  Antimicrobials:  5/1 vancomycin, metronidazole, cefepime  Interim history/subjective:  This morning presented to ICU.  Still on low-dose norepinephrine.  Feeling a little bit better than yesterday.  Tolerating ice chips.  No bowel movement or flatus.  Still distended stomach.  Objective   Blood pressure (!) 117/59, pulse 89, temperature 97.8 F (36.6 C), temperature source Axillary, resp. rate (!) 26, height 5\' 8"  (1.727 m), weight 70.2 kg, SpO2 100 %.        Intake/Output Summary (Last 24 hours) at 02/05/2020 1402 Last data filed at 02/05/2020 0700 Gross per 24 hour  Intake 4688.55 ml  Output 325 ml  Net 4363.55 ml   Filed Weights   02/04/20 2344 02/05/20 0500  Weight: 68.4 kg 70.2 kg    Examination: General: Elderly man, no distress HENT: Dry mucous membranes Lungs: Bilateral breath sounds diminished in the bases Cardiovascular: Regular rate and rhythm Abdomen: Distended, tender to palpation, no peritoneal signs Neuro: Oriented to self, follows commands Extremities: 1+ pitting edema   Resolved Hospital Problem list     Assessment & Plan:  Mr. [name] is a 86 year old gentleman with a history of atrial fibrillation on Eliquis, coronary  disease status post CABG who presents with:  Small bowel obstruction versus ileus -NG tube needs to be advanced, will advance and continue to low wall suction -Surgery following, appreciate recs.  We will continue bowel rest and IV fluids -Possible small bowel follow-through tomorrow if not improving  Circulatory versus septic shock, present on admission -Intra-abdominal versus pulmonary source - Initially received vancomycin, cefepime, metronidazole. - Will transition to Zosyn for intra-abdominal and aspiration - will come down on norepinephrine as tolerated  Acute hypoxemic respiratory failure  Aspiration pneumonia -Antibiotics as above -He has a small left-sided pleural effusion which we will monitor for now.  Atrial fibrillation  Coronary artery disease status post CABG Currently rate controlled.  Continue Eliquis  AKI on CKD -Likely prerenal etiology -Gentle IV fluids   Best practice:  Diet: N.p.o. except meds, ice chips Pain/Anxiety/Delirium protocol (if indicated): Avoid opioids VAP protocol (if indicated): N/A DVT prophylaxis: On Eliquis GI prophylaxis: N/A Glucose control: We will monitor Foley none Mobility: Out of bed with assist Code Status: DNR Family Communication: We will update Disposition: Needs ICU  Labs   CBC: Recent Labs  Lab 02/04/20 1325 02/05/20 0048  WBC 10.2 13.3*  NEUTROABS 8.8*  --   HGB 11.3* 10.0*  HCT 34.5* 30.3*  MCV 91.0 92.9  PLT 255 [id number]    Basic Metabolic Panel: Recent Labs  Lab 02/04/20 1325 02/05/20 0048  NA 135 138  K 3.1* 3.5  CL 92* 97*  CO2 28 28  GLUCOSE 104* 86  BUN 59* 62*  CREATININE 2.24* 2.17*  CALCIUM 10.1  9.3  MG 2.2 2.1  PHOS  --  3.5   GFR: Estimated Creatinine Clearance: 20.6 mL/min (A) (by C-G formula based on SCr of 2.17 mg/dL (H)). Recent Labs  Lab 02/04/20 1325 02/04/20 1531 02/04/20 1819 02/04/20 2000 02/05/20 0048 02/05/20 0450  WBC 10.2  --   --   --  13.3*  --   LATICACIDVEN  2.3*   < > 3.6* 4.3* 3.1* 2.4*   < > = values in this interval not displayed.    Liver Function Tests: Recent Labs  Lab 02/04/20 1325  AST 20  ALT 14  ALKPHOS 53  BILITOT 1.7*  PROT 7.9  ALBUMIN 3.4*   No results for input(s): LIPASE, AMYLASE in the last 168 hours. No results for input(s): AMMONIA in the last 168 hours.  ABG    Component Value Date/Time   PHART 7.374 09/17/2012 2153   PCO2ART 35.4 09/17/2012 2153   PO2ART 91.0 09/17/2012 2153   HCO3 20.8 09/17/2012 2153   TCO2 20 09/18/2012 1718   ACIDBASEDEF 4.0 (H) 09/17/2012 2153   O2SAT 97.0 09/17/2012 2153     Coagulation Profile: Recent Labs  Lab 02/04/20 1325  INR 1.6*    Cardiac Enzymes: No results for input(s): CKTOTAL, CKMB, CKMBINDEX, TROPONINI in the last 168 hours.  HbA1C: Hgb A1c MFr Bld  Date/Time Value Ref Range Status  02/05/2020 12:48 AM 5.4 4.8 - 5.6 % Final    Comment:    (NOTE) Pre diabetes:          5.7%-6.4% Diabetes:              >6.4% Glycemic control for   <7.0% adults with diabetes   09/16/2012 08:13 PM 6.3 (H) <5.7 % Final    Comment:    (NOTE)                                                                       According to the ADA Clinical Practice Recommendations for 2011, when HbA1c is used as a screening test:  >=6.5%   Diagnostic of Diabetes Mellitus           (if abnormal result is confirmed) 5.7-6.4%   Increased risk of developing Diabetes Mellitus References:Diagnosis and Classification of Diabetes Mellitus,Diabetes [id number] 1):S62-S69 and Standards of Medical Care in         Diabetes - 2011,Diabetes [id number] (Suppl 1):S11-S61.    CBG: Recent Labs  Lab 02/05/20 0017 02/05/20 0431 02/05/20 0742 02/05/20 1144  GLUCAP 71 80 82 82    Critical care time:   The patient is critically ill with multiple organ systems failure and requires high complexity decision making for assessment and support, frequent evaluation and titration of therapies,  application of advanced monitoring technologies and extensive interpretation of multiple databases.   Critical Care Time devoted to patient care services described in this note is 33 minutes. This time reflects the time of my personal involvement. This critical care time does not reflect separately billable procedures or procedure time, teaching time or supervisory time of PA/NP/Med student/Med Resident etc but could involve care discussion time.  [facility] 02/05/2020 2:02 PM  Pager: [phone] After hours pager: [phone]

## 2020-02-05 NOTE — Progress Notes (Signed)
ANTICOAGULATION CONSULT NOTE - Follow Up Consult  Pharmacy Consult for IV Heparin Indication: atrial fibrillation  No Known Allergies  Patient Measurements: Height: 5\' 8"  (172.7 cm) Weight: 70.2 kg (154 lb 12.2 oz) IBW/kg (Calculated) : 68.4 Heparin Dosing Weight: 70.2 kg  Vital Signs: Temp: 98.6 F (37 C) (05/02 2012) Temp Source: Oral (05/02 2012) BP: 108/55 (05/02 1500) Pulse Rate: 106 (05/02 1500)  Labs: Recent Labs    02/04/20 1325 02/04/20 1325 02/05/20 0048 02/05/20 0624 02/05/20 2014  HGB 11.3*  --  10.0*  --   --   HCT 34.5*  --  30.3*  --   --   PLT 255  --  217  --   --   APTT 38*   < > 52* 44* 61*  LABPROT 18.3*  --   --   --   --   INR 1.6*  --   --   --   --   HEPARINUNFRC  --   --  1.03*  --   --   CREATININE 2.24*  --  2.17*  --   --    < > = values in this interval not displayed.    Estimated Creatinine Clearance: 20.6 mL/min (A) (by C-G formula based on SCr of 2.17 mg/dL (H)).   Assessment: 86 year old male on Eliquis prior to admission (last dose 4/30 at 2000) for atrial fibrillation (CHADS2VASc = 4) which is on hold currently due to SBO. Pharmacy consulted for IV Heparin.   Using aPTT to monitor due to effect of recent Eliquis on heparin level.   APTT 61 just subtherapeutic. Will adjust conservatively given age, anemia, and hx GIB.   Goal of Therapy:  Heparin level 0.3-0.7 units/ml aPTT 66 -102 seconds Monitor platelets by anticoagulation protocol: Yes   Plan:  Increase IV Heparin to 1000 units/hr.  F/u aPTT until correlates with heparin level  Daily aPTT and HL, CBC/plt Monitor for signs/symptoms of bleeding    Benetta Spar, PharmD, BCPS, BCCP Clinical Pharmacist  Please check AMION for all [place] phone numbers After 10:00 PM, call [place]

## 2020-02-05 NOTE — Progress Notes (Signed)
  Echocardiogram 2D Echocardiogram has been performed.  Tiffaney Heimann A Alizabeth Antonio 02/05/2020, 2:08 PM

## 2020-02-05 NOTE — Progress Notes (Signed)
[facility] Progress Note     Subjective: CC-  NG tube in place but has had only scant bilious fluid out since placement. Patient denies any current abdominal pain, n/v. No flatus or BM.  Objective: Vital signs in last 24 hours: Temp:  [98.2 F (36.8 C)-102.8 F (39.3 C)] 98.2 F (36.8 C) (05/02 0700) Pulse Rate:  [62-118] 84 (05/02 0800) Resp:  [18-42] 21 (05/02 0800) BP: (71-134)/(43-102) 114/59 (05/02 0700) SpO2:  [80 %-100 %] 100 % (05/02 0700) Weight:  [68.4 kg-70.2 kg] 70.2 kg (05/02 0500)    Intake/Output from previous day: 05/01 0701 - 05/02 0700 In: 4688.6 [I.V.:2324.1; IV Piggyback:2364.5] Out: 325 [Urine:325] Intake/Output this shift: No intake/output data recorded.  PE: Gen:  Alert, NAD, pleasant Card:  RRR Pulm:  Trace wheezing bilaterally, no rhonchi, rate and effort normal on [place] Abd: Soft, NT/ND, +BS Ext:  calves soft and nontender Skin: no rashes noted, warm and dry  Lab Results:  Recent Labs    02/04/20 1325 02/05/20 0048  WBC 10.2 13.3*  HGB 11.3* 10.0*  HCT 34.5* 30.3*  PLT 255 217   BMET Recent Labs    02/04/20 1325 02/05/20 0048  NA 135 138  K 3.1* 3.5  CL 92* 97*  CO2 28 28  GLUCOSE 104* 86  BUN 59* 62*  CREATININE 2.24* 2.17*  CALCIUM 10.1 9.3   PT/INR Recent Labs    02/04/20 1325  LABPROT 18.3*  INR 1.6*   CMP     Component Value Date/Time   NA 138 02/05/2020 0048   NA 141 01/20/2020 1217   K 3.5 02/05/2020 0048   CL 97 (L) 02/05/2020 0048   CO2 28 02/05/2020 0048   GLUCOSE 86 02/05/2020 0048   BUN 62 (H) 02/05/2020 0048   BUN 22 01/20/2020 1217   CREATININE 2.17 (H) 02/05/2020 0048   CALCIUM 9.3 02/05/2020 0048   PROT 7.9 02/04/2020 1325   PROT 7.8 08/10/2019 1411   ALBUMIN 3.4 (L) 02/04/2020 1325   ALBUMIN 3.9 08/10/2019 1411   AST 20 02/04/2020 1325   ALT 14 02/04/2020 1325   ALKPHOS 53 02/04/2020 1325   BILITOT 1.7 (H) 02/04/2020 1325   BILITOT 0.7 08/10/2019 1411   GFRNONAA 25 (L)  02/05/2020 0048   GFRAA 29 (L) 02/05/2020 0048   Lipase     Component Value Date/Time   LIPASE 29 11/10/2011 0745       Studies/Results: CT ABDOMEN PELVIS WO CONTRAST  Result Date: 02/04/2020 CLINICAL DATA:  Suspect bowel obstruction. Sepsis. Previous bowel surgeries. Nausea, vomiting and diarrhea. Acute renal insufficiency. EXAM: CT ABDOMEN AND PELVIS WITHOUT CONTRAST TECHNIQUE: Multidetector CT imaging of the abdomen and pelvis was performed following the standard protocol without IV contrast. COMPARISON:  11/10/2011 FINDINGS: Lower chest: Sternotomy wires are present. Prosthetic material versus calcification over the mitral valve. Suggestion of previous aortic valve surgery. Moderate bibasilar opacification likely due to infection as well as associated basilar atelectasis. There is a small to moderate size left pleural effusion with enhancing pleura suggesting empyema. Calcification over the descending thoracic aorta. Hepatobiliary: Cholelithiasis. Liver and biliary tree are unremarkable. Pancreas: Normal. Spleen: Normal. Adrenals/Urinary Tract: Adrenal glands are normal. Kidneys normal in size without hydronephrosis or nephrolithiasis. Small cyst over the upper pole right kidney. Ureters and bladder are normal. Stomach/Bowel: Mild gastric distension. Fluid over the distal esophagus which may be due to reflux or dysmotility. Mild dilatation of the distal aspect of the duodenal C sweep. There are multiple air and  fluid-filled mildly dilated jejunal loops measuring up to 3.5 cm in diameter. No definite transition point. Surgical sutures over the right colon with ileocolic anastomosis. Moderate diverticulosis of the distal descending and sigmoid colon without active inflammation. Colon is somewhat decompressed. Vascular/Lymphatic: Moderate calcified plaque over the abdominal aorta which is normal in caliber. No adenopathy. Reproductive: Normal. Other: Small right inguinal hernia containing a short  segment of small bowel. Small amount of free fluid over the right pericolic gutter. No significant focal inflammatory change. No free peritoneal air. Musculoskeletal: Degenerative changes of the spine and hips. IMPRESSION: 1. Multiple dilated jejunal loops with somewhat decompressed colon. No transition point identified. Small amount of free fluid over the right pericolic gutter. Findings may be due to early/partial small bowel obstruction versus ileus. 2. Moderate airspace opacification over the mid to lower right lung and left base likely due to infection with associated basilar atelectasis. Moderate size left effusion with enhancing pleura suggesting empyema. 3.  Cholelithiasis. 4.  Colonic diverticulosis. 5.  Small upper pole right renal cyst. 6.  Aortic Atherosclerosis (ICD10-I70.0). 7.  Right inguinal hernia containing a short segment of small bowel. Electronically Signed   By: Marin Olp M.D.   On: 02/04/2020 17:07   CT Head Wo Contrast  Result Date: 02/04/2020 CLINICAL DATA:  Altered mental status. EXAM: CT HEAD WITHOUT CONTRAST TECHNIQUE: Contiguous axial images were obtained from the base of the skull through the vertex without intravenous contrast. COMPARISON:  CT dated 07/08/2019. FINDINGS: Brain: No evidence of acute infarction, hemorrhage, hydrocephalus, extra-axial collection or mass lesion/mass effect. Atrophy and chronic microvascular ischemic changes are again noted. Vascular: No hyperdense vessel or unexpected calcification. Skull: Normal. Negative for fracture or focal lesion. Sinuses/Orbits: This complete opacification of the right maxillary sinus which is likely chronic. The remaining paranasal sinuses and mastoid air cells are essentially clear. Other: None. IMPRESSION: 1. No acute intracranial abnormality. 2. Atrophy and chronic microvascular ischemic changes. 3. Chronic right maxillary sinusitis. Electronically Signed   By: Constance Holster M.D.   On: 02/04/2020 16:55   DG Chest  Portable 1 View  Result Date: 02/04/2020 CLINICAL DATA:  86 year old male with a history of tachycardia EXAM: PORTABLE CHEST 1 VIEW COMPARISON:  06/22/2019 FINDINGS: Cardiomediastinal silhouette unchanged in size and contour with surgical changes of median sternotomy, CABG, aortic valve repair. Similar appearance of mitral calcifications. Soft tissues of the chin overlying the upper chest limit evaluation, however, no evidence of pneumothorax. Interlobular septal thickening, similar to the prior. Blunting of the left costophrenic sulcus. Hazy opacities at the bilateral lung bases though improved from the prior. IMPRESSION: Although improved aeration compared to the plain film of 06/22/2019, there is evidence of acute CHF and bilateral small pleural effusions. Surgical changes of median sternotomy, CABG, aortic valve repair. Electronically Signed   By: Corrie Mckusick D.O.   On: 02/04/2020 14:44    Anti-infectives: Anti-infectives (From admission, onward)   Start     Dose/Rate Route Frequency Ordered Stop   02/06/20 1600  vancomycin (VANCOCIN) IVPB 1000 mg/200 mL premix     1,000 mg 200 mL/hr over 60 Minutes Intravenous Every 48 hours 02/04/20 1517     02/05/20 1600  ceFEPIme (MAXIPIME) 2 g in sodium chloride 0.9 % 100 mL IVPB     2 g 200 mL/hr over 30 Minutes Intravenous Every 24 hours 02/04/20 1517     02/04/20 2100  metroNIDAZOLE (FLAGYL) IVPB 500 mg     500 mg 100 mL/hr over 60 Minutes Intravenous Every  8 hours 02/04/20 2058     02/04/20 1345  ceFEPIme (MAXIPIME) 2 g in sodium chloride 0.9 % 100 mL IVPB     2 g 200 mL/hr over 30 Minutes Intravenous  Once 02/04/20 1334 02/04/20 1611   02/04/20 1345  metroNIDAZOLE (FLAGYL) IVPB 500 mg     500 mg 100 mL/hr over 60 Minutes Intravenous  Once 02/04/20 1334 02/04/20 1507   02/04/20 1345  vancomycin (VANCOCIN) IVPB 1000 mg/200 mL premix     1,000 mg 200 mL/hr over 60 Minutes Intravenous  Once 02/04/20 1334 02/04/20 1503        Assessment/Plan atrial fibrillation on Eliquis aortic stenosis CAD s/p CABG in 2013 HTN Hypothyroidism  AKI Lactic acidosis - LA trending down 2.4 Hypokalemia - improved K 3.5  Partial SBO vs ileus ? Aspiration PNA - per CCM - NG tube placed over night, I ordered a film to check placement. Continue NG tube to LIWS today. May consider small bowel protocol tomorrow  ID - mazipime/flagyl/vanco 5/1>> FEN - IVF, NPO/NGT to LIWS VTE - IV heparin Foley - in place Follow up - TBD   LOS: 1 day    Wellington [place], [facility] 02/05/2020, 8:23 AM Please see Amion for pager number during day hours 7:00am-4:30pm

## 2020-02-06 ENCOUNTER — Inpatient Hospital Stay (HOSPITAL_COMMUNITY): Payer: Medicare Other

## 2020-02-06 DIAGNOSIS — J189 Pneumonia, unspecified organism: Secondary | ICD-10-CM

## 2020-02-06 DIAGNOSIS — L8990 Pressure ulcer of unspecified site, unspecified stage: Secondary | ICD-10-CM | POA: Insufficient documentation

## 2020-02-06 DIAGNOSIS — E039 Hypothyroidism, unspecified: Secondary | ICD-10-CM

## 2020-02-06 DIAGNOSIS — I4891 Unspecified atrial fibrillation: Secondary | ICD-10-CM

## 2020-02-06 LAB — BASIC METABOLIC PANEL
Anion gap: 13 (ref 5–15)
BUN: 66 mg/dL — ABNORMAL HIGH (ref 8–23)
CO2: 28 mmol/L (ref 22–32)
Calcium: 9.3 mg/dL (ref 8.9–10.3)
Chloride: 99 mmol/L (ref 98–111)
Creatinine, Ser: 1.89 mg/dL — ABNORMAL HIGH (ref 0.61–1.24)
GFR calc Af Amer: 35 mL/min — ABNORMAL LOW (ref >60)
GFR calc non Af Amer: 30 mL/min — ABNORMAL LOW (ref >60)
Glucose, Bld: 77 mg/dL (ref 70–99)
Potassium: 3.1 mmol/L — ABNORMAL LOW (ref 3.5–5.1)
Sodium: 140 mmol/L (ref 135–145)

## 2020-02-06 LAB — URINE CULTURE: Culture: NO GROWTH

## 2020-02-06 LAB — CBC
HCT: 27.8 % — ABNORMAL LOW (ref 39.0–52.0)
Hemoglobin: 8.9 g/dL — ABNORMAL LOW (ref 13.0–17.0)
MCH: 29.4 pg (ref 26.0–34.0)
MCHC: 32 g/dL (ref 30.0–36.0)
MCV: 91.7 fL (ref 80.0–100.0)
Platelets: 147 10*3/uL — ABNORMAL LOW (ref 150–400)
RBC: 3.03 MIL/uL — ABNORMAL LOW (ref 4.22–5.81)
RDW: 14.6 % (ref 11.5–15.5)
WBC: 12.8 10*3/uL — ABNORMAL HIGH (ref 4.0–10.5)
nRBC: 0 % (ref 0.0–0.2)

## 2020-02-06 LAB — UREA NITROGEN, URINE: Urea Nitrogen, Ur: 518 mg/dL

## 2020-02-06 LAB — HEPARIN LEVEL (UNFRACTIONATED): Heparin Unfractionated: 0.69 IU/mL (ref 0.30–0.70)

## 2020-02-06 LAB — GLUCOSE, CAPILLARY
Glucose-Capillary: 63 mg/dL — ABNORMAL LOW (ref 70–99)
Glucose-Capillary: 64 mg/dL — ABNORMAL LOW (ref 70–99)
Glucose-Capillary: 64 mg/dL — ABNORMAL LOW (ref 70–99)
Glucose-Capillary: 64 mg/dL — ABNORMAL LOW (ref 70–99)
Glucose-Capillary: 65 mg/dL — ABNORMAL LOW (ref 70–99)
Glucose-Capillary: 70 mg/dL (ref 70–99)
Glucose-Capillary: 70 mg/dL (ref 70–99)

## 2020-02-06 LAB — APTT
aPTT: 99 seconds — ABNORMAL HIGH (ref 24–36)
aPTT: 84 seconds — ABNORMAL HIGH (ref 24–36)

## 2020-02-06 MED ORDER — KCL IN DEXTROSE-NACL 40-5-0.45 MEQ/L-%-% IV SOLN
INTRAVENOUS | Status: DC
  Filled 2020-02-06 (×2): qty 1000

## 2020-02-06 MED ORDER — DEXTROSE 50 % IV SOLN
INTRAVENOUS | Status: AC
  Filled 2020-02-06: qty 50

## 2020-02-06 MED ORDER — POTASSIUM CHLORIDE 10 MEQ/100ML IV SOLN
10.0000 meq | INTRAVENOUS | Status: DC
  Administered 2020-02-06 (×3): 10 meq via INTRAVENOUS
  Filled 2020-02-06 (×4): qty 100

## 2020-02-06 MED ORDER — DIATRIZOATE MEGLUMINE & SODIUM 66-10 % PO SOLN
90.0000 mL | Freq: Once | ORAL | Status: AC
  Administered 2020-02-06: 90 mL via NASOGASTRIC
  Filled 2020-02-06: qty 90

## 2020-02-06 MED ORDER — DEXTROSE 50 % IV SOLN
12.5000 g | Freq: Once | INTRAVENOUS | Status: AC
  Administered 2020-02-06: 12.5 g via INTRAVENOUS

## 2020-02-06 MED ORDER — LEVOTHYROXINE SODIUM 100 MCG/5ML IV SOLN
50.0000 ug | Freq: Every day | INTRAVENOUS | Status: DC
  Administered 2020-02-07: 50 ug via INTRAVENOUS
  Filled 2020-02-06: qty 5

## 2020-02-06 NOTE — Progress Notes (Signed)
PROGRESS NOTE  [name]  [id number] DOB: 1924-08-04 DOA: 02/04/2020 PCP: Mayra Neer, MD   Brief Narrative: [name] is a 86 y.o. male with a history of CAD s/p CABG and AS AVR 2013, AFib on eliquis, HTN, and hypothyroidism who presented on 5/1 with SBO versus ileus and suspected aspiration pneumonia with septic shock requiring pressor support, AKI, and AFib with RVR.   In the ED: Febrile to 102.8, tachypneic and tacycardic. BP's low normal. Patient requring 10 L HFNC. Labs remarkable for: K 3.1, Cr 2.24. Lactate 2.3>2.8>3.6>4.3. WBC 10.2, INR 18.3, Mag 2.2, PTT 38. GI consulted for possible SBP and will follow. NGT placed. Cefepime, Vanc, Flagyl for possible PNA. Zofran given. S/p 3L IVF. CXR: Although improved aeration compared to the plain film of 06/22/2019, there is evidence of acute CHF and bilateral small pleural effusions. CT abd/pelvis showed: Multiple dilated jejunal loops with somewhat decompressed colon. No transition point identified. Small amount of free fluid over the right pericolic gutter. Findings may be due to early/partial small bowel obstruction versus ileus. 2. Moderate airspace opacification over the mid to lower right lung and left base likely due to infection with associated basilar atelectasis. Moderate size left effusion with enhancing pleura suggesting empyema.  He was admitted to the ICU, NG tube placed, surgery consulted. Hemodynamics have improved, weaned from pressors, antibiotics narrowed to zosyn, and small bowel gastrografin protocol is underway.  Assessment & Plan: Principal Problem:   SBO (small bowel obstruction) (HCC) Active Problems:   S/P aortic valve replacement with bioprosthetic valve   S/P CABG (coronary artery bypass graft)   Atrial fibrillation (HCC)   Essential hypertension   Hypothyroidism   Aspiration pneumonia (HCC)   Sepsis (HCC)   AKI (acute kidney injury) ([place])   CKD (chronic kidney disease) stage 2, GFR 60-89  ml/min   Septic shock (HCC)   Community acquired pneumonia of right lung   Pressure injury of skin  Septic shock and acute hypoxic respiratory failure due to aspiration pneumonia with left pleural effusion, POA: Shock resolved.  - Complete zosyn. Consider repeat chest imaging to evaluate for need for thoracentesis after antibiotics completed. - Continue supplemental oxygen as needed to maintain normal respiratory effort and SpO2 >90%.  - Sputum culture pending, blood cultures NGTD x2 days.  SBO vs. ileus:  - Continue NGT to LIWS - Gastrografin protocol started today, appreciate general surgery recommendations.  - NPO, prn IV antiemetics, giving low rate (sub-maintenance) IVF due to evidence of volume overload.   Hypokalemia:  - Supplement by IV  Acute on chronic HFpEF: Following sepsis-protocol IV fluids. Has pulmonary edema on XR and by exam.  - Holding lasix while NPO, giving low rate IVF - Echocardiogram shows normal LV systolic function, LVH, nl RV, nl IVC.  Hypoglycemia:  - Continue low rate of dextrose IVF  CAD s/p CABG: No chest pain.  - Continue anticoagulation, pt not on statin, holding BB w/soft BPs continued.  Chronic atrial fibrillation: Initially with RVR, since resolved.  - Continue IV heparin in place of eliquis.   AKI on stage IIIa CKD: - Improving, not back to baseline (~1.1).  Hypothyroidism: TSH 2.3 - Continue synthroid by IV, will adjust to 1/2 PO dose.   RN Pressure Injury Documentation: Pressure Injury 02/05/20 Sacrum Medial Stage 1 -  Intact skin with non-blanchable redness of a localized area usually over a bony prominence. (Active)  02/05/20 0000  Location: Sacrum  Location Orientation: Medial  Staging: Stage 1 -  Intact skin with non-blanchable redness of a localized area usually over a bony prominence.  Wound Description (Comments):   Present on Admission: Yes   DVT prophylaxis: IV heparin Code Status: DNR Family Communication: None at  bedside Disposition Plan:  Status is: Inpatient  Remains inpatient appropriate because:Hemodynamically unstable, Ongoing diagnostic testing needed not appropriate for outpatient work up, IV treatments appropriate due to intensity of illness or inability to take PO and Inpatient level of care appropriate due to severity of illness  Dispo: The patient is from: Home              Anticipated d/c is to: TBD              Anticipated d/c date is: > 3 days              Patient currently is not medically stable to d/c.  Consultants:   PCCM  General surgery  Procedures:   None  Antimicrobials:  Vancomycin, cefepime, flagyl 5/1 - 5/2  Zosyn 5/2 >>   Subjective: Tired, complaining of dry mouth, wants something po. Does not feel short of breath, has no abdominal pain but is nauseated. No flatus or BM.   Objective: Vitals:   02/06/20 1100 02/06/20 1200 02/06/20 1300 02/06/20 1400  BP: 128/69 121/66 117/66 (!) 111/58  Pulse: 90 90 96 99  Resp: (!) 24 (!) 21 (!) 34 (!) 29  Temp:   98.3 F (36.8 C)   TempSrc:   Oral   SpO2: 100% 100% 98% 91%  Weight:      Height:        Intake/Output Summary (Last 24 hours) at 02/06/2020 1504 Last data filed at 02/06/2020 1400 Gross per 24 hour  Intake 1166.54 ml  Output 1075 ml  Net 91.54 ml   Filed Weights   02/04/20 2344 02/05/20 0500  Weight: 68.4 kg 70.2 kg    Gen: Elderly frail male in no distress HEENT: Dry mucous membranes, NG tube in situ Pulm: Non-labored breathing. Crackles bilaterally. CV: Irreg irreg w/prominent S2, no JVD, 1+ pedal edema. GI: Abdomen soft, protuberant without taut distention, hypoactive bowel sounds, minimal diffuse tenderness.  Ext: Warm, no deformities Skin: No rashes, lesions or ulcers Neuro: Alert and somewhat oriented. Follows commands. Psych: Judgement and insight appear impaired. Mood & affect appropriate.   Data Reviewed: I have personally reviewed following labs and imaging studies  CBC: Recent  Labs  Lab 02/04/20 1325 02/05/20 0048 02/06/20 0431  WBC 10.2 13.3* 12.8*  NEUTROABS 8.8*  --   --   HGB 11.3* 10.0* 8.9*  HCT 34.5* 30.3* 27.8*  MCV 91.0 92.9 91.7  PLT 255 217 [id number]*   Basic Metabolic Panel: Recent Labs  Lab 02/04/20 1325 02/05/20 0048 02/06/20 0431  NA 135 138 140  K 3.1* 3.5 3.1*  CL 92* 97* 99  CO2 28 28 28   GLUCOSE 104* 86 77  BUN 59* 62* 66*  CREATININE 2.24* 2.17* 1.89*  CALCIUM 10.1 9.3 9.3  MG 2.2 2.1  --   PHOS  --  3.5  --    GFR: Estimated Creatinine Clearance: 23.6 mL/min (A) (by C-G formula based on SCr of 1.89 mg/dL (H)). Liver Function Tests: Recent Labs  Lab 02/04/20 1325  AST 20  ALT 14  ALKPHOS 53  BILITOT 1.7*  PROT 7.9  ALBUMIN 3.4*   No results for input(s): LIPASE, AMYLASE in the last 168 hours. No results for input(s): AMMONIA in the last 168 hours. Coagulation Profile:  Recent Labs  Lab 02/04/20 1325  INR 1.6*   Cardiac Enzymes: No results for input(s): CKTOTAL, CKMB, CKMBINDEX, TROPONINI in the last 168 hours. BNP (last 3 results) Recent Labs    07/25/19 1033 08/10/19 1411 01/20/20 1217  PROBNP 3,330* 6,202* 2,330*   HbA1C: Recent Labs    02/05/20 0048  HGBA1C 5.4   CBG: Recent Labs  Lab 02/05/20 2010 02/06/20 0015 02/06/20 0418 02/06/20 0808 02/06/20 1130  GLUCAP 68* 63* 64* 64* 65*   Lipid Profile: No results for input(s): CHOL, HDL, LDLCALC, TRIG, CHOLHDL, LDLDIRECT in the last 72 hours. Thyroid Function Tests: No results for input(s): TSH, T4TOTAL, FREET4, T3FREE, THYROIDAB in the last 72 hours. Anemia Panel: No results for input(s): VITAMINB12, FOLATE, FERRITIN, TIBC, IRON, RETICCTPCT in the last 72 hours. Urine analysis:    Component Value Date/Time   COLORURINE AMBER (A) 02/04/2020 2316   APPEARANCEUR HAZY (A) 02/04/2020 2316   LABSPEC 1.021 02/04/2020 2316   PHURINE 5.0 02/04/2020 2316   GLUCOSEU NEGATIVE 02/04/2020 2316   HGBUR NEGATIVE 02/04/2020 2316   BILIRUBINUR NEGATIVE  02/04/2020 2316   KETONESUR 5 (A) 02/04/2020 2316   PROTEINUR 100 (A) 02/04/2020 2316   UROBILINOGEN 0.2 09/17/2012 0358   NITRITE NEGATIVE 02/04/2020 2316   LEUKOCYTESUR NEGATIVE 02/04/2020 2316   Recent Results (from the past 240 hour(s))  Culture, blood (Routine x 2)     Status: None (Preliminary result)   Collection Time: 02/04/20  1:25 PM   Specimen: BLOOD RIGHT ARM  Result Value Ref Range Status   Specimen Description BLOOD RIGHT ARM  Final   Special Requests   Final    BOTTLES DRAWN AEROBIC AND ANAEROBIC Blood Culture adequate volume   Culture   Final    NO GROWTH 2 DAYS Performed at [facility], [place] [address], [place], [place] [address]    Report Status PENDING  Incomplete  Urine culture     Status: None   Collection Time: 02/04/20  1:32 PM   Specimen: In/Out Cath Urine  Result Value Ref Range Status   Specimen Description IN/OUT CATH URINE  Final   Special Requests NONE  Final   Culture   Final    NO GROWTH Performed at [facility], [place] [address], [place], [place] [address]    Report Status 02/06/2020 FINAL  Final  Culture, blood (Routine x 2)     Status: None (Preliminary result)   Collection Time: 02/04/20  2:00 PM   Specimen: BLOOD  Result Value Ref Range Status   Specimen Description BLOOD RIGHT ANTECUBITAL  Final   Special Requests   Final    BOTTLES DRAWN AEROBIC AND ANAEROBIC Blood Culture results may not be optimal due to an excessive volume of blood received in culture bottles   Culture   Final    NO GROWTH 2 DAYS Performed at [facility], [place] [address] [place], [place] [address]    Report Status PENDING  Incomplete  Respiratory Panel by RT PCR (Flu A&B, Covid) - Nasopharyngeal Swab     Status: None   Collection Time: 02/04/20  3:33 PM   Specimen: Nasopharyngeal Swab  Result Value Ref Range Status   SARS Coronavirus 2 by RT PCR NEGATIVE NEGATIVE Final    Comment: (NOTE) SARS-CoV-2 target nucleic acids are NOT DETECTED. The  SARS-CoV-2 RNA is generally detectable in upper respiratoy specimens during the acute phase of infection. The lowest concentration of SARS-CoV-2 viral copies this assay can detect  is 131 copies/mL. A negative result does not preclude SARS-Cov-2 infection and should not be used as the sole basis for treatment or other patient management decisions. A negative result may occur with  improper specimen collection/handling, submission of specimen other than nasopharyngeal swab, presence of viral mutation(s) within the areas targeted by this assay, and inadequate number of viral copies (<131 copies/mL). A negative result must be combined with clinical observations, patient history, and epidemiological information. The expected result is Negative. Fact Sheet for Patients:  PinkCheek.be Fact Sheet for Healthcare Providers:  GravelBags.it This test is not yet ap proved or cleared by the [place] FDA and  has been authorized for detection and/or diagnosis of SARS-CoV-2 by FDA under an Emergency Use Authorization (EUA). This EUA will remain  in effect (meaning this test can be used) for the duration of the COVID-19 declaration under Section 564(b)(1) of the Act, 21 U.S.C. section 360bbb-3(b)(1), unless the authorization is terminated or revoked sooner.    Influenza A by PCR NEGATIVE NEGATIVE Final   Influenza B by PCR NEGATIVE NEGATIVE Final    Comment: (NOTE) The Xpert Xpress SARS-CoV-2/FLU/RSV assay is intended as an aid in  the diagnosis of influenza from Nasopharyngeal swab specimens and  should not be used as a sole basis for treatment. Nasal washings and  aspirates are unacceptable for Xpert Xpress SARS-CoV-2/FLU/RSV  testing. Fact Sheet for Patients: PinkCheek.be Fact Sheet for Healthcare Providers: GravelBags.it This test is not yet approved or cleared by the [place] FDA and  has been authorized for detection and/or diagnosis of SARS-CoV-2 by  FDA under an Emergency Use Authorization (EUA). This EUA will remain  in effect (meaning this test can be used) for the duration of the  Covid-19 declaration under Section 564(b)(1) of the Act, 21  U.S.C. section 360bbb-3(b)(1), unless the authorization is  terminated or revoked. Performed at [facility], [place] [address] [place], [place] [address]   MRSA PCR Screening     Status: None   Collection Time: 02/04/20 11:47 PM   Specimen: Nasal Mucosa; Nasopharyngeal  Result Value Ref Range Status   MRSA by PCR NEGATIVE NEGATIVE Final    Comment:        The GeneXpert MRSA Assay (FDA approved for NASAL specimens only), is one component of a comprehensive MRSA colonization surveillance program. It is not intended to diagnose MRSA infection nor to guide or monitor treatment for MRSA infections. Performed at [facility], [place] [address], [place], [place] [address]       Radiology Studies: CT ABDOMEN PELVIS WO CONTRAST  Result Date: 02/04/2020 CLINICAL DATA:  Suspect bowel obstruction. Sepsis. Previous bowel surgeries. Nausea, vomiting and diarrhea. Acute renal insufficiency. EXAM: CT ABDOMEN AND PELVIS WITHOUT CONTRAST TECHNIQUE: Multidetector CT imaging of the abdomen and pelvis was performed following the standard protocol without IV contrast. COMPARISON:  11/10/2011 FINDINGS: Lower chest: Sternotomy wires are present. Prosthetic material versus calcification over the mitral valve. Suggestion of previous aortic valve surgery. Moderate bibasilar opacification likely due to infection as well as associated basilar atelectasis. There is a small to moderate size left pleural effusion with enhancing pleura suggesting empyema. Calcification over the descending thoracic aorta. Hepatobiliary: Cholelithiasis. Liver and biliary tree are unremarkable. Pancreas: Normal. Spleen: Normal. Adrenals/Urinary Tract:  Adrenal glands are normal. Kidneys normal in size without hydronephrosis or nephrolithiasis. Small cyst over the upper pole right kidney. Ureters and bladder are normal. Stomach/Bowel: Mild gastric distension. Fluid over the distal esophagus which may  be due to reflux or dysmotility. Mild dilatation of the distal aspect of the duodenal C sweep. There are multiple air and fluid-filled mildly dilated jejunal loops measuring up to 3.5 cm in diameter. No definite transition point. Surgical sutures over the right colon with ileocolic anastomosis. Moderate diverticulosis of the distal descending and sigmoid colon without active inflammation. Colon is somewhat decompressed. Vascular/Lymphatic: Moderate calcified plaque over the abdominal aorta which is normal in caliber. No adenopathy. Reproductive: Normal. Other: Small right inguinal hernia containing a short segment of small bowel. Small amount of free fluid over the right pericolic gutter. No significant focal inflammatory change. No free peritoneal air. Musculoskeletal: Degenerative changes of the spine and hips. IMPRESSION: 1. Multiple dilated jejunal loops with somewhat decompressed colon. No transition point identified. Small amount of free fluid over the right pericolic gutter. Findings may be due to early/partial small bowel obstruction versus ileus. 2. Moderate airspace opacification over the mid to lower right lung and left base likely due to infection with associated basilar atelectasis. Moderate size left effusion with enhancing pleura suggesting empyema. 3.  Cholelithiasis. 4.  Colonic diverticulosis. 5.  Small upper pole right renal cyst. 6.  Aortic Atherosclerosis (ICD10-I70.0). 7.  Right inguinal hernia containing a short segment of small bowel. Electronically Signed   By: Marin Olp M.D.   On: 02/04/2020 17:07   CT Head Wo Contrast  Result Date: 02/04/2020 CLINICAL DATA:  Altered mental status. EXAM: CT HEAD WITHOUT CONTRAST TECHNIQUE: Contiguous  axial images were obtained from the base of the skull through the vertex without intravenous contrast. COMPARISON:  CT dated 07/08/2019. FINDINGS: Brain: No evidence of acute infarction, hemorrhage, hydrocephalus, extra-axial collection or mass lesion/mass effect. Atrophy and chronic microvascular ischemic changes are again noted. Vascular: No hyperdense vessel or unexpected calcification. Skull: Normal. Negative for fracture or focal lesion. Sinuses/Orbits: This complete opacification of the right maxillary sinus which is likely chronic. The remaining paranasal sinuses and mastoid air cells are essentially clear. Other: None. IMPRESSION: 1. No acute intracranial abnormality. 2. Atrophy and chronic microvascular ischemic changes. 3. Chronic right maxillary sinusitis. Electronically Signed   By: Constance Holster M.D.   On: 02/04/2020 16:55   DG Abd Portable 1V  Result Date: 02/05/2020 CLINICAL DATA:  NG tube placement. EXAM: PORTABLE ABDOMEN - 1 VIEW COMPARISON:  CT 02/04/2020 FINDINGS: Nasogastric tube has tip over the stomach in the left upper quadrant with side-port in the region of the gastroesophageal junction. Persistent air-filled dilated small bowel loops over the left abdomen. No definite free peritoneal air. Remainder the exam is unchanged. IMPRESSION: Persistent dilated small bowel loops over the left abdomen. Nasogastric tube with tip over the stomach in the left upper quadrant. Electronically Signed   By: Marin Olp M.D.   On: 02/05/2020 10:24   ECHOCARDIOGRAM COMPLETE  Result Date: 02/05/2020    ECHOCARDIOGRAM REPORT   Patient Name:   MICAI ANUSZEWSKI Date of Exam: 02/05/2020 Medical Rec #:  [id number]       Height:       68.0 in Accession #:    [id number]      Weight:       154.8 lb Date of Birth:  02/25/1924       BSA:          1.833 m Patient Age:    33 years        BP:           117/59 mmHg Patient Gender: M  HR:           89 bpm. Exam Location:  Inpatient Procedure: 2D Echo  Indications:    Hypotension [id number]  History:        Patient has prior history of Echocardiogram examinations, most                 recent 03/30/2015. Prior CABG and S/P aortic valve replacement                 with bioprosthetic valve, Arrythmias:Atrial Fibrillation; Risk                 Factors:Hypertension. Sepsis                 CKD.                 Aortic Valve: valve is present in the aortic position.  Sonographer:    Vikki Ports Turrentine Referring Phys: [id number] [place]  1. Normal LV systolic function; mild LVH; s/p AVR with mean gradient 8 mmHg and trace AI; mild MR; biatrial enlargement; mild TR.  2. Left ventricular ejection fraction, by estimation, is 60 to 65%. The left ventricle has normal function. The left ventricle has no regional wall motion abnormalities. There is mild left ventricular hypertrophy. Left ventricular diastolic function could not be evaluated.  3. Right ventricular systolic function is normal. The right ventricular size is normal. There is normal pulmonary artery systolic pressure.  4. Left atrial size was severely dilated.  5. Right atrial size was severely dilated.  6. The mitral valve is normal in structure. Mild mitral valve regurgitation. No evidence of mitral stenosis.  7. The aortic valve has been repaired/replaced. Aortic valve regurgitation is trivial. No aortic stenosis is present. There is a valve present in the aortic position.  8. The inferior vena cava is normal in size with greater than 50% respiratory variability, suggesting right atrial pressure of 3 mmHg. FINDINGS  Left Ventricle: Left ventricular ejection fraction, by estimation, is 60 to 65%. The left ventricle has normal function. The left ventricle has no regional wall motion abnormalities. The left ventricular internal cavity size was normal in size. There is  mild left ventricular hypertrophy. Left ventricular diastolic function could not be evaluated due to atrial fibrillation. Left  ventricular diastolic function could not be evaluated. Right Ventricle: The right ventricular size is normal. Right ventricular systolic function is normal. There is normal pulmonary artery systolic pressure. The tricuspid regurgitant velocity is 2.48 m/s, and with an assumed right atrial pressure of 8 mmHg,  the estimated right ventricular systolic pressure is [id number] mmHg. Left Atrium: Left atrial size was severely dilated. Right Atrium: Right atrial size was severely dilated. Pericardium: There is no evidence of pericardial effusion. Mitral Valve: The mitral valve is normal in structure. Normal mobility of the mitral valve leaflets. Moderate mitral annular calcification. Mild mitral valve regurgitation. No evidence of mitral valve stenosis. Tricuspid Valve: The tricuspid valve is normal in structure. Tricuspid valve regurgitation is mild . No evidence of tricuspid stenosis. Aortic Valve: The aortic valve has been repaired/replaced. Aortic valve regurgitation is trivial. No aortic stenosis is present. Aortic valve mean gradient measures 8.0 mmHg. Aortic valve peak gradient measures 15.3 mmHg. Aortic valve area, by VTI measures 2.53 cm. There is a valve present in the aortic position. Pulmonic Valve: The pulmonic valve was normal in structure. Pulmonic valve regurgitation is not visualized. No evidence of pulmonic stenosis. Aorta: The aortic root is normal in  size and structure. Venous: The inferior vena cava is normal in size with greater than 50% respiratory variability, suggesting right atrial pressure of 3 mmHg. IAS/Shunts: No atrial level shunt detected by color flow Doppler. Additional Comments: Normal LV systolic function; mild LVH; s/p AVR with mean gradient 8 mmHg and trace AI; mild MR; biatrial enlargement; mild TR.  LEFT VENTRICLE PLAX 2D LVIDd:         3.80 cm LVIDs:         2.50 cm LV PW:         1.30 cm LV IVS:        1.30 cm LVOT diam:     1.90 cm LV SV:         89 LV SV Index:   49 LVOT Area:     2.84  cm  RIGHT VENTRICLE RV S prime:     6.85 cm/s TAPSE (M-mode): 0.8 cm LEFT ATRIUM           Index       RIGHT ATRIUM           Index LA diam:      5.20 cm 2.84 cm/m  RA Area:     28.80 cm LA Vol (A4C): 72.9 ml 39.78 ml/m RA Volume:   105.00 ml 57.29 ml/m  AORTIC VALVE AV Area (Vmax):    2.35 cm AV Area (Vmean):   2.56 cm AV Area (VTI):     2.53 cm AV Vmax:           195.50 cm/s AV Vmean:          134.500 cm/s AV VTI:            0.353 m AV Peak Grad:      15.3 mmHg AV Mean Grad:      8.0 mmHg LVOT Vmax:         162.00 cm/s LVOT Vmean:        121.400 cm/s LVOT VTI:          0.314 m LVOT/AV VTI ratio: 0.89  AORTA Ao Root diam: 2.70 cm MITRAL VALVE                TRICUSPID VALVE MV Area (PHT): 3.89 cm     TR Peak grad:   24.6 mmHg MV Decel Time: 195 msec     TR Vmax:        248.00 cm/s MV E velocity: 146.33 cm/s                             SHUNTS                             Systemic VTI:  0.31 m                             Systemic Diam: 1.90 cm Kirk Ruths MD Electronically signed by Kirk Ruths MD Signature Date/Time: 02/05/2020/2:41:25 PM    Final     Scheduled Meds: . Chlorhexidine Gluconate Cloth  6 each Topical Q0600  . [START ON 02/07/2020] levothyroxine  50 mcg Intravenous Daily  . pantoprazole (PROTONIX) IV  40 mg Intravenous Q24H   Continuous Infusions: . sodium chloride Stopped (02/06/20 0117)  . dextrose 5 % and 0.45 % NaCl with KCl 40 mEq/L 50 mL/hr at 02/06/20 1400  . heparin  1,000 Units/hr (02/06/20 1400)  . piperacillin-tazobactam (ZOSYN)  IV 3.375 g (02/06/20 1452)     LOS: 2 days   Time spent: 35 minutes.  Patrecia Pour, MD [facility] [email or web address] 02/06/2020, 3:04 PM

## 2020-02-06 NOTE — Progress Notes (Signed)
Patient ID: [name], male   DOB: [birth year], 86 y.o.   MRN: [id number]       Subjective: Confused, asking for his wife.  No flatus or BM as he recalls.  Denies abdominal pain  ROS: See above, otherwise other systems negative  Objective: Vital signs in last 24 hours: Temp:  [97.7 F (36.5 C)-99.2 F (37.3 C)] 99.2 F (37.3 C) (05/03 0419) Pulse Rate:  [74-138] 87 (05/03 0500) Resp:  [19-31] 23 (05/03 0500) BP: (103-130)/(50-90) 103/52 (05/03 0500) SpO2:  [91 %-100 %] 96 % (05/03 0500)    Intake/Output from previous day: 05/02 0701 - 05/03 0700 In: 450.6 [I.V.:379.7; IV Piggyback:70.9] Out: 1075 [Urine:475; Emesis/NG output:600] Intake/Output this shift: No intake/output data recorded.  PE: Heart: irregular Lungs: CTAB anteriorly Abd: soft, minimally bloated, +BS, NGt in place on regular wall suction at 256 mmHg!  Minimal bilious output  Lab Results:  Recent Labs    02/05/20 0048 02/06/20 0431  WBC 13.3* 12.8*  HGB 10.0* 8.9*  HCT 30.3* 27.8*  PLT 217 147*   BMET Recent Labs    02/05/20 0048 02/06/20 0431  NA 138 140  K 3.5 3.1*  CL 97* 99  CO2 28 28  GLUCOSE 86 77  BUN 62* 66*  CREATININE 2.17* 1.89*  CALCIUM 9.3 9.3   PT/INR Recent Labs    02/04/20 1325  LABPROT 18.3*  INR 1.6*   CMP     Component Value Date/Time   NA 140 02/06/2020 0431   NA 141 01/20/2020 1217   K 3.1 (L) 02/06/2020 0431   CL 99 02/06/2020 0431   CO2 28 02/06/2020 0431   GLUCOSE 77 02/06/2020 0431   BUN 66 (H) 02/06/2020 0431   BUN 22 01/20/2020 1217   CREATININE 1.89 (H) 02/06/2020 0431   CALCIUM 9.3 02/06/2020 0431   PROT 7.9 02/04/2020 1325   PROT 7.8 08/10/2019 1411   ALBUMIN 3.4 (L) 02/04/2020 1325   ALBUMIN 3.9 08/10/2019 1411   AST 20 02/04/2020 1325   ALT 14 02/04/2020 1325   ALKPHOS 53 02/04/2020 1325   BILITOT 1.7 (H) 02/04/2020 1325   BILITOT 0.7 08/10/2019 1411   GFRNONAA 30 (L) 02/06/2020 0431   GFRAA 35 (L) 02/06/2020 0431   Lipase       Component Value Date/Time   LIPASE 29 11/10/2011 0745       Studies/Results: CT ABDOMEN PELVIS WO CONTRAST  Result Date: 02/04/2020 CLINICAL DATA:  Suspect bowel obstruction. Sepsis. Previous bowel surgeries. Nausea, vomiting and diarrhea. Acute renal insufficiency. EXAM: CT ABDOMEN AND PELVIS WITHOUT CONTRAST TECHNIQUE: Multidetector CT imaging of the abdomen and pelvis was performed following the standard protocol without IV contrast. COMPARISON:  11/10/2011 FINDINGS: Lower chest: Sternotomy wires are present. Prosthetic material versus calcification over the mitral valve. Suggestion of previous aortic valve surgery. Moderate bibasilar opacification likely due to infection as well as associated basilar atelectasis. There is a small to moderate size left pleural effusion with enhancing pleura suggesting empyema. Calcification over the descending thoracic aorta. Hepatobiliary: Cholelithiasis. Liver and biliary tree are unremarkable. Pancreas: Normal. Spleen: Normal. Adrenals/Urinary Tract: Adrenal glands are normal. Kidneys normal in size without hydronephrosis or nephrolithiasis. Small cyst over the upper pole right kidney. Ureters and bladder are normal. Stomach/Bowel: Mild gastric distension. Fluid over the distal esophagus which may be due to reflux or dysmotility. Mild dilatation of the distal aspect of the duodenal C sweep. There are multiple air and fluid-filled mildly dilated jejunal loops measuring up to 3.5  cm in diameter. No definite transition point. Surgical sutures over the right colon with ileocolic anastomosis. Moderate diverticulosis of the distal descending and sigmoid colon without active inflammation. Colon is somewhat decompressed. Vascular/Lymphatic: Moderate calcified plaque over the abdominal aorta which is normal in caliber. No adenopathy. Reproductive: Normal. Other: Small right inguinal hernia containing a short segment of small bowel. Small amount of free fluid over the right  pericolic gutter. No significant focal inflammatory change. No free peritoneal air. Musculoskeletal: Degenerative changes of the spine and hips. IMPRESSION: 1. Multiple dilated jejunal loops with somewhat decompressed colon. No transition point identified. Small amount of free fluid over the right pericolic gutter. Findings may be due to early/partial small bowel obstruction versus ileus. 2. Moderate airspace opacification over the mid to lower right lung and left base likely due to infection with associated basilar atelectasis. Moderate size left effusion with enhancing pleura suggesting empyema. 3.  Cholelithiasis. 4.  Colonic diverticulosis. 5.  Small upper pole right renal cyst. 6.  Aortic Atherosclerosis (ICD10-I70.0). 7.  Right inguinal hernia containing a short segment of small bowel. Electronically Signed   By: Marin Olp M.D.   On: 02/04/2020 17:07   CT Head Wo Contrast  Result Date: 02/04/2020 CLINICAL DATA:  Altered mental status. EXAM: CT HEAD WITHOUT CONTRAST TECHNIQUE: Contiguous axial images were obtained from the base of the skull through the vertex without intravenous contrast. COMPARISON:  CT dated 07/08/2019. FINDINGS: Brain: No evidence of acute infarction, hemorrhage, hydrocephalus, extra-axial collection or mass lesion/mass effect. Atrophy and chronic microvascular ischemic changes are again noted. Vascular: No hyperdense vessel or unexpected calcification. Skull: Normal. Negative for fracture or focal lesion. Sinuses/Orbits: This complete opacification of the right maxillary sinus which is likely chronic. The remaining paranasal sinuses and mastoid air cells are essentially clear. Other: None. IMPRESSION: 1. No acute intracranial abnormality. 2. Atrophy and chronic microvascular ischemic changes. 3. Chronic right maxillary sinusitis. Electronically Signed   By: Constance Holster M.D.   On: 02/04/2020 16:55   DG Chest Portable 1 View  Result Date: 02/04/2020 CLINICAL DATA:   86 year old male with a history of tachycardia EXAM: PORTABLE CHEST 1 VIEW COMPARISON:  06/22/2019 FINDINGS: Cardiomediastinal silhouette unchanged in size and contour with surgical changes of median sternotomy, CABG, aortic valve repair. Similar appearance of mitral calcifications. Soft tissues of the chin overlying the upper chest limit evaluation, however, no evidence of pneumothorax. Interlobular septal thickening, similar to the prior. Blunting of the left costophrenic sulcus. Hazy opacities at the bilateral lung bases though improved from the prior. IMPRESSION: Although improved aeration compared to the plain film of 06/22/2019, there is evidence of acute CHF and bilateral small pleural effusions. Surgical changes of median sternotomy, CABG, aortic valve repair. Electronically Signed   By: Corrie Mckusick D.O.   On: 02/04/2020 14:44   DG Abd Portable 1V  Result Date: 02/05/2020 CLINICAL DATA:  NG tube placement. EXAM: PORTABLE ABDOMEN - 1 VIEW COMPARISON:  CT 02/04/2020 FINDINGS: Nasogastric tube has tip over the stomach in the left upper quadrant with side-port in the region of the gastroesophageal junction. Persistent air-filled dilated small bowel loops over the left abdomen. No definite free peritoneal air. Remainder the exam is unchanged. IMPRESSION: Persistent dilated small bowel loops over the left abdomen. Nasogastric tube with tip over the stomach in the left upper quadrant. Electronically Signed   By: Marin Olp M.D.   On: 02/05/2020 10:24   ECHOCARDIOGRAM COMPLETE  Result Date: 02/05/2020    ECHOCARDIOGRAM REPORT  Patient Name:   [name] Date of Exam: 02/05/2020 Medical Rec #:  [id number]       Height:       68.0 in Accession #:    [id number]      Weight:       154.8 lb Date of Birth:  [birth year]       BSA:          1.833 m Patient Age:    61 years        BP:           117/59 mmHg Patient Gender: M               HR:           89 bpm. Exam Location:  Inpatient Procedure: 2D Echo  Indications:    Hypotension [id number]  History:        Patient has prior history of Echocardiogram examinations, most                 recent 03/30/2015. Prior CABG and S/P aortic valve replacement                 with bioprosthetic valve, Arrythmias:Atrial Fibrillation; Risk                 Factors:Hypertension. Sepsis                 CKD.                 Aortic Valve: valve is present in the aortic position.  Sonographer:    Vikki Ports Turrentine Referring Phys: [id number] [place]  1. Normal LV systolic function; mild LVH; s/p AVR with mean gradient 8 mmHg and trace AI; mild MR; biatrial enlargement; mild TR.  2. Left ventricular ejection fraction, by estimation, is 60 to 65%. The left ventricle has normal function. The left ventricle has no regional wall motion abnormalities. There is mild left ventricular hypertrophy. Left ventricular diastolic function could not be evaluated.  3. Right ventricular systolic function is normal. The right ventricular size is normal. There is normal pulmonary artery systolic pressure.  4. Left atrial size was severely dilated.  5. Right atrial size was severely dilated.  6. The mitral valve is normal in structure. Mild mitral valve regurgitation. No evidence of mitral stenosis.  7. The aortic valve has been repaired/replaced. Aortic valve regurgitation is trivial. No aortic stenosis is present. There is a valve present in the aortic position.  8. The inferior vena cava is normal in size with greater than 50% respiratory variability, suggesting right atrial pressure of 3 mmHg. FINDINGS  Left Ventricle: Left ventricular ejection fraction, by estimation, is 60 to 65%. The left ventricle has normal function. The left ventricle has no regional wall motion abnormalities. The left ventricular internal cavity size was normal in size. There is  mild left ventricular hypertrophy. Left ventricular diastolic function could not be evaluated due to atrial fibrillation. Left  ventricular diastolic function could not be evaluated. Right Ventricle: The right ventricular size is normal. Right ventricular systolic function is normal. There is normal pulmonary artery systolic pressure. The tricuspid regurgitant velocity is 2.48 m/s, and with an assumed right atrial pressure of 8 mmHg,  the estimated right ventricular systolic pressure is [id number] mmHg. Left Atrium: Left atrial size was severely dilated. Right Atrium: Right atrial size was severely dilated. Pericardium: There is no evidence of pericardial effusion. Mitral Valve: The mitral valve is normal in  structure. Normal mobility of the mitral valve leaflets. Moderate mitral annular calcification. Mild mitral valve regurgitation. No evidence of mitral valve stenosis. Tricuspid Valve: The tricuspid valve is normal in structure. Tricuspid valve regurgitation is mild . No evidence of tricuspid stenosis. Aortic Valve: The aortic valve has been repaired/replaced. Aortic valve regurgitation is trivial. No aortic stenosis is present. Aortic valve mean gradient measures 8.0 mmHg. Aortic valve peak gradient measures 15.3 mmHg. Aortic valve area, by VTI measures 2.53 cm. There is a valve present in the aortic position. Pulmonic Valve: The pulmonic valve was normal in structure. Pulmonic valve regurgitation is not visualized. No evidence of pulmonic stenosis. Aorta: The aortic root is normal in size and structure. Venous: The inferior vena cava is normal in size with greater than 50% respiratory variability, suggesting right atrial pressure of 3 mmHg. IAS/Shunts: No atrial level shunt detected by color flow Doppler. Additional Comments: Normal LV systolic function; mild LVH; s/p AVR with mean gradient 8 mmHg and trace AI; mild MR; biatrial enlargement; mild TR.  LEFT VENTRICLE PLAX 2D LVIDd:         3.80 cm LVIDs:         2.50 cm LV PW:         1.30 cm LV IVS:        1.30 cm LVOT diam:     1.90 cm LV SV:         89 LV SV Index:   49 LVOT Area:     2.84  cm  RIGHT VENTRICLE RV S prime:     6.85 cm/s TAPSE (M-mode): 0.8 cm LEFT ATRIUM           Index       RIGHT ATRIUM           Index LA diam:      5.20 cm 2.84 cm/m  RA Area:     28.80 cm LA Vol (A4C): 72.9 ml 39.78 ml/m RA Volume:   105.00 ml 57.29 ml/m  AORTIC VALVE AV Area (Vmax):    2.35 cm AV Area (Vmean):   2.56 cm AV Area (VTI):     2.53 cm AV Vmax:           195.50 cm/s AV Vmean:          134.500 cm/s AV VTI:            0.353 m AV Peak Grad:      15.3 mmHg AV Mean Grad:      8.0 mmHg LVOT Vmax:         162.00 cm/s LVOT Vmean:        121.400 cm/s LVOT VTI:          0.314 m LVOT/AV VTI ratio: 0.89  AORTA Ao Root diam: 2.70 cm MITRAL VALVE                TRICUSPID VALVE MV Area (PHT): 3.89 cm     TR Peak grad:   24.6 mmHg MV Decel Time: 195 msec     TR Vmax:        248.00 cm/s MV E velocity: 146.33 cm/s                             SHUNTS                             Systemic VTI:  0.31 m                             Systemic Diam: 1.90 cm Kirk Ruths MD Electronically signed by Kirk Ruths MD Signature Date/Time: 02/05/2020/2:41:25 PM    Final     Anti-infectives: Anti-infectives (From admission, onward)   Start     Dose/Rate Route Frequency Ordered Stop   02/06/20 1600  vancomycin (VANCOCIN) IVPB 1000 mg/200 mL premix  Status:  Discontinued     1,000 mg 200 mL/hr over 60 Minutes Intravenous Every 48 hours 02/04/20 1517 02/05/20 1124   02/05/20 1600  ceFEPIme (MAXIPIME) 2 g in sodium chloride 0.9 % 100 mL IVPB  Status:  Discontinued     2 g 200 mL/hr over 30 Minutes Intravenous Every 24 hours 02/04/20 1517 02/05/20 1124   02/05/20 1500  piperacillin-tazobactam (ZOSYN) IVPB 3.375 g     3.375 g 12.5 mL/hr over 240 Minutes Intravenous Every 8 hours 02/05/20 1124     02/04/20 2100  metroNIDAZOLE (FLAGYL) IVPB 500 mg  Status:  Discontinued     500 mg 100 mL/hr over 60 Minutes Intravenous Every 8 hours 02/04/20 2058 02/05/20 1124   02/04/20 1345  ceFEPIme (MAXIPIME) 2 g in sodium chloride  0.9 % 100 mL IVPB     2 g 200 mL/hr over 30 Minutes Intravenous  Once 02/04/20 1334 02/04/20 1611   02/04/20 1345  metroNIDAZOLE (FLAGYL) IVPB 500 mg     500 mg 100 mL/hr over 60 Minutes Intravenous  Once 02/04/20 1334 02/04/20 1507   02/04/20 1345  vancomycin (VANCOCIN) IVPB 1000 mg/200 mL premix     1,000 mg 200 mL/hr over 60 Minutes Intravenous  Once 02/04/20 1334 02/04/20 1503       Assessment/Plan atrial fibrillation on Eliquis aortic stenosis CADs/p CABG in 2013 HTN Hypothyroidism AKI Lactic acidosis - LA trending down 2.4 Hypokalemia - 3.1, replace Aspiration PNA - per medicine  Partial SBO vs ileus -cont NGT placement -suction fixed to LIWS around 152mmHG -SBO protocol today given no flatus or BM -mobilize as able -correct electrolytes  ID - mazipime/flagyl/vanco 5/1>> FEN - IVF, NPO/NGT to LIWS VTE - IV heparin Foley - in place Follow up - TBD   LOS: 2 days    Henreitta Cea , [facility] 02/06/2020, 7:49 AM Please see Amion for pager number during day hours 7:00am-4:30pm or 7:00am -11:30am on weekends

## 2020-02-06 NOTE — Progress Notes (Signed)
Pt arrived from [place] via bed with RN and tech's transporting. Pt is alert, oriented to self, pleasant and denies pain. Respirations unlabored on 2L [place], foley catheter intact and draining, NG to R nare connect to low intermittent wall suction; no drainage noted. Pt has 4 PIV's all intact. Right AC saline locked, left AC with D5 1/2NS with 20 Kcl infusing at 7ml/hr. Right FA with Heparin infusing at 50ml/hr. Right hand infusing IV antibiotic. Pt has foam dressing to sacral area, skin is reddened but blanchable under foam, no open areas noted. SCD's, telemetry monitor, and continuous pulse ox placed per orders. Pt in no acute distress.  02/06/2020 @ [place], RN

## 2020-02-06 NOTE — Progress Notes (Addendum)
ANTICOAGULATION CONSULT NOTE  Pharmacy Consult for IV Heparin Indication: atrial fibrillation  No Known Allergies  Patient Measurements: Height: 5\' 8"  (172.7 cm) Weight: 70.2 kg (154 lb 12.2 oz) IBW/kg (Calculated) : 68.4 Heparin Dosing Weight: 70.2 kg  Vital Signs: Temp: 99.2 F (37.3 C) (05/03 0419) Temp Source: Oral (05/03 0419) BP: 103/52 (05/03 0500) Pulse Rate: 87 (05/03 0500)  Labs: Recent Labs    02/04/20 1325 02/04/20 1325 02/05/20 0048 02/05/20 0048 02/05/20 0624 02/05/20 2014 02/06/20 0431  HGB 11.3*   < > 10.0*  --   --   --  8.9*  HCT 34.5*  --  30.3*  --   --   --  27.8*  PLT 255  --  217  --   --   --  147*  APTT 38*   < > 52*   < > 44* 61* 99*  LABPROT 18.3*  --   --   --   --   --   --   INR 1.6*  --   --   --   --   --   --   HEPARINUNFRC  --   --  1.03*  --   --   --  0.69  CREATININE 2.24*  --  2.17*  --   --   --  1.89*   < > = values in this interval not displayed.    Estimated Creatinine Clearance: 23.6 mL/min (A) (by C-G formula based on SCr of 1.89 mg/dL (H)).   Assessment: 86 year old male on Eliquis PTA (last dose 4/30 at 2000) for atrial fibrillation (CHADS2VASc = 4), which is on hold currently due to SBO. Pharmacy consulted for IV heparin.   Using aPTT to monitor due to effect of recent Eliquis on heparin level.  APTT therapeutic; no bleeding reported.  Noted drop in platelet count; no recent heparin exposure.  Goal of Therapy:  Heparin level 0.3-0.7 units/ml aPTT 66 -102 seconds Monitor platelets by anticoagulation protocol: Yes   Plan:  Continue heparin gtt at 1000 units/hr Check confirmatory aPTT Daily heparin level, aPTT and CBC   Sakai Heinle D. Mina Marble, PharmD, BCPS, [place] 02/06/2020, 11:18 AM  ==============================  Addendum: Confirmatory aPTT therapeutic; continue current heparin rate.  Tanea Moga D. Mina Marble, PharmD, [place], [place] 02/06/2020, 12:24 PM

## 2020-02-06 NOTE — Progress Notes (Signed)
Inpatient Diabetes Program Recommendations  AACE/ADA: New Consensus Statement on Inpatient Glycemic Control (2015)  Target Ranges:  Prepandial:   less than 140 mg/dL      Peak postprandial:   less than 180 mg/dL (1-2 hours)      Critically ill patients:  140 - 180 mg/dL   Results for [name], [name] (MRN [id number]) as of 02/06/2020 10:50  Ref. Range 02/05/2020 00:17 02/05/2020 04:31 02/05/2020 07:42 02/05/2020 11:44 02/05/2020 15:59 02/05/2020 20:10  Glucose-Capillary Latest Ref Range: 70 - 99 mg/dL 71 80 82 82 76 68 (L)   Results for [name], [name] (MRN [id number]) as of 02/06/2020 10:50  Ref. Range 02/06/2020 00:15 02/06/2020 04:18 02/06/2020 08:08  Glucose-Capillary Latest Ref Range: 70 - 99 mg/dL 63 (L) 64 (L) 64 (L)    Admit with: PartialSBOvs ileus/ Septic Shock  No History of Diabetes noted  Current Orders: Novolog Moderate Correction Scale/ SSI (0-15 units) Q4 hours    MD- Note patient does not have History of Diabetes  No Insulin given since admission--Hypoglycemia noted  Please consider:  1. Stop Novolog 0-15 units Q4 hours  2. Continue CBG checks Q4 hours     --Will follow patient during hospitalization--  Wyn Quaker RN, MSN, CDE Diabetes Coordinator Inpatient Glycemic Control Team Team Pager: [phone] (8a-5p)

## 2020-02-07 LAB — BASIC METABOLIC PANEL
Anion gap: 7 (ref 5–15)
BUN: 53 mg/dL — ABNORMAL HIGH (ref 8–23)
CO2: 27 mmol/L (ref 22–32)
Calcium: 9.7 mg/dL (ref 8.9–10.3)
Chloride: 106 mmol/L (ref 98–111)
Creatinine, Ser: 1.64 mg/dL — ABNORMAL HIGH (ref 0.61–1.24)
GFR calc Af Amer: 41 mL/min — ABNORMAL LOW (ref >60)
GFR calc non Af Amer: 36 mL/min — ABNORMAL LOW (ref >60)
Glucose, Bld: 107 mg/dL — ABNORMAL HIGH (ref 70–99)
Potassium: 3.2 mmol/L — ABNORMAL LOW (ref 3.5–5.1)
Sodium: 140 mmol/L (ref 135–145)

## 2020-02-07 LAB — GLUCOSE, CAPILLARY
Glucose-Capillary: 104 mg/dL — ABNORMAL HIGH (ref 70–99)
Glucose-Capillary: 116 mg/dL — ABNORMAL HIGH (ref 70–99)
Glucose-Capillary: 121 mg/dL — ABNORMAL HIGH (ref 70–99)
Glucose-Capillary: 134 mg/dL — ABNORMAL HIGH (ref 70–99)
Glucose-Capillary: 87 mg/dL (ref 70–99)
Glucose-Capillary: 96 mg/dL (ref 70–99)

## 2020-02-07 LAB — CBC
HCT: 29.9 % — ABNORMAL LOW (ref 39.0–52.0)
Hemoglobin: 9.5 g/dL — ABNORMAL LOW (ref 13.0–17.0)
MCH: 29.7 pg (ref 26.0–34.0)
MCHC: 31.8 g/dL (ref 30.0–36.0)
MCV: 93.4 fL (ref 80.0–100.0)
Platelets: 176 10*3/uL (ref 150–400)
RBC: 3.2 MIL/uL — ABNORMAL LOW (ref 4.22–5.81)
RDW: 14.6 % (ref 11.5–15.5)
WBC: 12.1 10*3/uL — ABNORMAL HIGH (ref 4.0–10.5)
nRBC: 0 % (ref 0.0–0.2)

## 2020-02-07 LAB — APTT: aPTT: 73 seconds — ABNORMAL HIGH (ref 24–36)

## 2020-02-07 LAB — HEPARIN LEVEL (UNFRACTIONATED): Heparin Unfractionated: 0.33 IU/mL (ref 0.30–0.70)

## 2020-02-07 MED ORDER — LEVOTHYROXINE SODIUM 100 MCG PO TABS
100.0000 ug | ORAL_TABLET | Freq: Every day | ORAL | Status: DC
  Administered 2020-02-08 – 2020-02-15 (×8): 100 ug via ORAL
  Filled 2020-02-07 (×8): qty 1

## 2020-02-07 MED ORDER — POTASSIUM CHLORIDE CRYS ER 20 MEQ PO TBCR
40.0000 meq | EXTENDED_RELEASE_TABLET | Freq: Once | ORAL | Status: AC
  Administered 2020-02-07: 40 meq via ORAL
  Filled 2020-02-07 (×2): qty 2

## 2020-02-07 MED ORDER — APIXABAN 2.5 MG PO TABS
2.5000 mg | ORAL_TABLET | Freq: Two times a day (BID) | ORAL | Status: DC
  Administered 2020-02-07 – 2020-02-15 (×16): 2.5 mg via ORAL
  Filled 2020-02-07 (×19): qty 1

## 2020-02-07 NOTE — TOC Initial Note (Signed)
Transition of Care [facility]) - Initial/Assessment Note    Patient Details  Name: [name] MRN: [id number] Date of Birth: Oct 08, 1923  [facility]) CM/SW Contact:    Alexander Mt, LCSW Phone Number: 02/07/2020, 11:35 AM  Clinical Narrative:                 Due to pt fluctuating mental status CSW spoke with pt wife via telephone. Introduced self, role, reason for call. Pt from home with her, confirmed home address and PCPs. Pt usually ambulates with no DME and does not have home oxygen. He does have a cane but no other DME. Pt wife will be up at the hospital today to see pt. We discussed that we would follow pt for any needs including DME (including oxygen) and therapies.   Plan at this time is for pt to return home.   Expected Discharge Plan: Home/Self Care Barriers to Discharge: Continued Medical Work up   Patient Goals and CMS Choice Patient states their goals for this hospitalization and ongoing recovery are:: return home when able   Choice offered to / list presented to : Spouse  Expected Discharge Plan and Services Expected Discharge Plan: Home/Self Care In-house Referral: Clinical Social Work Discharge Planning Services: CM Consult, Follow-up appt scheduled   Living arrangements for the past 2 months: Single Family Home  Prior Living Arrangements/Services Living arrangements for the past 2 months: Single Family Home Lives with:: Spouse Patient language and need for interpreter reviewed:: Yes(no needs) Do you feel safe going back to the place where you live?: Yes      Need for Family Participation in Patient Care: Yes (Comment)(assistance w/ daily cares) Care giver support system in place?: Yes (comment)(spouse; adult children) Current home services: DME Criminal Activity/Legal Involvement Pertinent to Current Situation/Hospitalization: No - Comment as needed   Permission Sought/Granted Permission sought to share information with : Family  Supports Permission granted to share information with : No(pt with some confusion)  Share Information with NAME: Kimm Hodgman     Permission granted to share info w Relationship: spouse  Permission granted to share info w Contact Information: [phone]  Emotional Assessment Appearance:: Other (Comment Required(telephonic assessment with wife) Attitude/Demeanor/Rapport: Other (comment)(telephonic assessment with wife) Affect (typically observed): Other (comment)(telephonic assessment with wife) Orientation: : Oriented to Self, Fluctuating Orientation (Suspected and/or reported Sundowners) Alcohol / Substance Use: Not Applicable Psych Involvement: (n/a)  Admission diagnosis:  Cough [R05] SBO (small bowel obstruction) ([place]) [K56.609] Hypoxia [R09.02] AKI (acute kidney injury) ([place]) [N17.9] Nausea vomiting and diarrhea [R11.2, R19.7] Septic shock ([facility]) [A41.9, R65.21] Fatigue, unspecified type [R53.83] Community acquired pneumonia of right lung, unspecified part of lung [J18.9] Sepsis, due to unspecified organism, unspecified whether acute organ dysfunction present [facility]) [A41.9] Patient Active Problem List   Diagnosis Date Noted  . Pressure injury of skin 02/06/2020  . Community acquired pneumonia of right lung   . SBO (small bowel obstruction) ([place]) 02/04/2020  . Aspiration pneumonia ([place]) 02/04/2020  . Sepsis ([place]) 02/04/2020  . AKI (acute kidney injury) ([place]) 02/04/2020  . CKD (chronic kidney disease) stage 2, GFR 60-89 ml/min 02/04/2020  . Septic shock ([place]) 02/04/2020  . Bifascicular bundle branch block 02/09/2018  . Closed fracture of right proximal humerus 09/04/2017  . Closed fracture of lower end of left radius with routine healing 12/15/2016  . SIADH (syndrome of inappropriate ADH production) ([place]) 07/23/2016  . Hyponatremia 07/23/2016  . Hypothyroidism 07/23/2016  . Atrial flutter ([place]) 01/16/2015  . Essential  hypertension 01/16/2014  . Atrial fibrillation ([place])  09/21/2012  . S/P CABG (coronary artery bypass graft) 09/16/2012    Class: Chronic  . Ulcer   . BPH (benign prostatic hypertrophy)   . Arthritis   . S/P aortic valve replacement with bioprosthetic valve    PCP:  Mayra Neer, MD Pharmacy:   [place], [place] - [address] [place] [address] Phone: [phone] Fax: [phone]  [facility] - [place], [place] [address] Phone: [phone] Fax: [phone]  [facility], [place] to [address] [place] [address] Phone: [phone] Fax: [phone]    Readmission Risk Interventions Readmission Risk Prevention Plan 02/07/2020  Transportation Screening Complete  PCP or Specialist Appt within 5-7 Days Complete  Home Care Screening Complete  Medication Review (RN CM) Referral to Pharmacy  Some recent data might be hidden

## 2020-02-07 NOTE — Progress Notes (Signed)
At 2340 pt's blood sugar was 69. Hypoglycemia standing orders implemented and pt was given 12.5g D50 per orders. Pt is asymptomatic, denies any distress. 02/06/2020 @ 2356 Cyndi Bender, RN

## 2020-02-07 NOTE — Progress Notes (Signed)
ANTICOAGULATION CONSULT NOTE - Follow Up Consult  Pharmacy Consult for Heparin Indication: atrial fibrillation  No Known Allergies  Patient Measurements: Height: 5\' 8"  (172.7 cm) Weight: 70.2 kg (154 lb 12.2 oz) IBW/kg (Calculated) : 68.4 Heparin Dosing Weight:  70.2 kg  Vital Signs: Temp: 97.4 F (36.3 C) (05/04 0407) Temp Source: Oral (05/04 0407) BP: 134/71 (05/04 0407) Pulse Rate: 91 (05/04 0407)  Labs: Recent Labs    02/04/20 1325 02/04/20 1325 02/05/20 0048 02/05/20 0624 02/06/20 0431 02/06/20 1131 02/07/20 0411  HGB 11.3*   < > 10.0*  --  8.9*  --  9.5*  HCT 34.5*   < > 30.3*  --  27.8*  --  29.9*  PLT 255   < > 217  --  147*  --  176  APTT 38*   < > 52*   < > 99* 84* 73*  LABPROT 18.3*  --   --   --   --   --   --   INR 1.6*  --   --   --   --   --   --   HEPARINUNFRC  --   --  1.03*  --  0.69  --  0.33  CREATININE 2.24*   < > 2.17*  --  1.89*  --  1.64*   < > = values in this interval not displayed.    Estimated Creatinine Clearance: 27.2 mL/min (A) (by C-G formula based on SCr of 1.64 mg/dL (H)).   Assessment:  Anticoag: Afib on Eliquis PTA (LD 4/30 PM) for afib >> Heparin d/t possible SBO/procedures.  HL 0.69 now 0.33 this AM still in goal, aPTT 73 in goal. Hgb 9.5 up. Plts 176 ok.  Goal of Therapy:  Heparin level 0.3-0.7 units/ml Monitor platelets by anticoagulation protocol: Yes   Plan:  Continue heparin gtt at 1000 units/hr Daily heparin level and CBC    Jaxxson Cavanah S. Alford Highland, PharmD, BCPS Clinical Staff Pharmacist [email or web address] Alford Highland, [place] 02/07/2020,9:00 AM

## 2020-02-07 NOTE — Progress Notes (Signed)
PROGRESS NOTE  [name]  [id number] DOB: August 27, 1924 DOA: 02/04/2020 PCP: Mayra Neer, MD   Brief Narrative: [name] is a 86 y.o. male with a history of CAD s/p CABG and AS AVR 2013, AFib on eliquis, HTN, and hypothyroidism who presented on 5/1 with SBO versus ileus and suspected aspiration pneumonia with septic shock requiring pressor support, AKI, and AFib with RVR.   In the ED: Febrile to 102.8, tachypneic and tacycardic. BP's low normal. Patient requring 10 L HFNC. Labs remarkable for: K 3.1, Cr 2.24. Lactate 2.3>2.8>3.6>4.3. WBC 10.2, INR 18.3, Mag 2.2, PTT 38. GI consulted for possible SBP and will follow. NGT placed. Cefepime, Vanc, Flagyl for possible PNA. Zofran given. S/p 3L IVF. CXR: Although improved aeration compared to the plain film of 06/22/2019, there is evidence of acute CHF and bilateral small pleural effusions. CT abd/pelvis showed: Multiple dilated jejunal loops with somewhat decompressed colon. No transition point identified. Small amount of free fluid over the right pericolic gutter. Findings may be due to early/partial small bowel obstruction versus ileus. 2. Moderate airspace opacification over the mid to lower right lung and left base likely due to infection with associated basilar atelectasis. Moderate size left effusion with enhancing pleura suggesting empyema.  He was admitted to the ICU, NG tube placed, surgery consulted. Hemodynamics have improved, weaned from pressors, antibiotics narrowed to zosyn, and small bowel gastrografin protocol is underway.  Assessment & Plan: Principal Problem:   SBO (small bowel obstruction) (HCC) Active Problems:   S/P aortic valve replacement with bioprosthetic valve   S/P CABG (coronary artery bypass graft)   Atrial fibrillation (HCC)   Essential hypertension   Hypothyroidism   Aspiration pneumonia (HCC)   Sepsis (HCC)   AKI (acute kidney injury) ([place])   CKD (chronic kidney disease) stage 2, GFR 60-89  ml/min   Septic shock (HCC)   Community acquired pneumonia of right lung   Pressure injury of skin  Septic shock and acute hypoxic respiratory failure due to aspiration pneumonia with left pleural effusion, POA: Shock resolved.  - Continue zosyn. Consider repeat chest imaging to evaluate for need for thoracentesis after antibiotics completed. - Continue supplemental oxygen as needed to maintain normal respiratory effort and SpO2 >90%.  - Sputum culture listed as "sent", blood cultures NGTD x2 days.  Partial SBO vs. ileus: Improving with contrast to rectum on AXR yesterday.  - DC NGT, advance diet as tolerated. Surgery has signed off.   Hypokalemia:  - Supplement again today.  Acute on chronic HFpEF: Following sepsis-protocol IV fluids. Has pulmonary edema on XR and by exam.  - Holding lasix while NPO/taking minimal po. Stop IVF. Likely restart lasix soon. - Echocardiogram shows normal LV systolic function, LVH, nl RV, nl IVC.  Hypoglycemia:  - Continue monitoring with onset of diet.  CAD s/p CABG: No chest pain.  - Continue anticoagulation, pt not on statin, holding BB w/soft BPs continued.  Chronic atrial fibrillation: Initially with RVR, since resolved.  - Can change back to eliquis from IV heparin now that taking po.   AKI on stage IIIa CKD: - Improving, not back to baseline (~1.1), will hold lasix another day, but stop IVF as above.  Hypothyroidism: TSH 2.3 - Continue synthroid  RN Pressure Injury Documentation: Pressure Injury 02/05/20 Sacrum Medial Stage 1 -  Intact skin with non-blanchable redness of a localized area usually over a bony prominence. (Active)  02/05/20 0000  Location: Sacrum  Location Orientation: Medial  Staging: Stage 1 -  Intact skin with non-blanchable redness of a localized area usually over a bony prominence.  Wound Description (Comments):   Present on Admission: Yes   DVT prophylaxis: IV heparin > eliquis Code Status: DNR Family  Communication: None at bedside Disposition Plan:  Status is: Inpatient  Remains inpatient appropriate because:IV treatments appropriate due to intensity of illness or inability to take PO and Inpatient level of care appropriate due to severity of illness  Dispo: The patient is from: Home              Anticipated d/c is to: TBD, PT eval ordered.              Anticipated d/c date is: 2 days              Patient currently is not medically stable to d/c.  Consultants:   PCCM  General surgery  Procedures:   None  Antimicrobials:  Vancomycin, cefepime, flagyl 5/1 - 5/2  Zosyn 5/2 >>   Subjective: No pain, mild nausea, starting to take liquids this AM after NGT removed. No shortness of breath.  Objective: Vitals:   02/07/20 0407 02/07/20 0429 02/07/20 1030 02/07/20 1421  BP: 134/71  124/76 (!) 154/75  Pulse: 91  (!) 106 (!) 102  Resp: 18  20 20   Temp: (!) 97.4 F (36.3 C)  97.9 F (36.6 C)   TempSrc: Oral  Oral   SpO2: 99%  94% 98%  Weight:  70.2 kg    Height:        Intake/Output Summary (Last 24 hours) at 02/07/2020 1426 Last data filed at 02/07/2020 1130 Gross per 24 hour  Intake 1111.31 ml  Output 1225 ml  Net -113.69 ml   Filed Weights   02/04/20 2344 02/05/20 0500 02/07/20 0429  Weight: 68.4 kg 70.2 kg 70.2 kg   Gen: Frail, elderly male in no distress Pulm: Nonlabored breathing supplemental oxygen. Crackles less. CV: Regular rate and rhythm. No murmur, rub, or gallop. No JVD, no significant dependent edema. GI: Abdomen soft, minimally tender, non-distended, with normoactive bowel sounds.  Ext: Warm, no deformities Skin: No new rashes, lesions or ulcers on visualized skin. Neuro: Alert and incompletely oriented. No focal neurological deficits. Psych: Judgement and insight appear fair. Mood euthymic & affect congruent. Behavior is appropriate.    Data Reviewed: I have personally reviewed following labs and imaging studies  CBC: Recent Labs  Lab 02/04/20  1325 02/05/20 0048 02/06/20 0431 02/07/20 0411  WBC 10.2 13.3* 12.8* 12.1*  NEUTROABS 8.8*  --   --   --   HGB 11.3* 10.0* 8.9* 9.5*  HCT 34.5* 30.3* 27.8* 29.9*  MCV 91.0 92.9 91.7 93.4  PLT 255 217 147* 0000000   Basic Metabolic Panel: Recent Labs  Lab 02/04/20 1325 02/05/20 0048 02/06/20 0431 02/07/20 0411  NA 135 138 140 140  K 3.1* 3.5 3.1* 3.2*  CL 92* 97* 99 106  CO2 28 28 28 27   GLUCOSE 104* 86 77 107*  BUN 59* 62* 66* 53*  CREATININE 2.24* 2.17* 1.89* 1.64*  CALCIUM 10.1 9.3 9.3 9.7  MG 2.2 2.1  --   --   PHOS  --  3.5  --   --    GFR: Estimated Creatinine Clearance: 27.2 mL/min (A) (by C-G formula based on SCr of 1.64 mg/dL (H)). Liver Function Tests: Recent Labs  Lab 02/04/20 1325  AST 20  ALT 14  ALKPHOS 53  BILITOT 1.7*  PROT 7.9  ALBUMIN 3.4*  No results for input(s): LIPASE, AMYLASE in the last 168 hours. No results for input(s): AMMONIA in the last 168 hours. Coagulation Profile: Recent Labs  Lab 02/04/20 1325  INR 1.6*   Cardiac Enzymes: No results for input(s): CKTOTAL, CKMB, CKMBINDEX, TROPONINI in the last 168 hours. BNP (last 3 results) Recent Labs    07/25/19 1033 08/10/19 1411 01/20/20 1217  PROBNP 3,330* 6,202* 2,330*   HbA1C: Recent Labs    02/05/20 0048  HGBA1C 5.4   CBG: Recent Labs  Lab 02/06/20 2340 02/07/20 0015 02/07/20 0407 02/07/20 0750 02/07/20 1146  GLUCAP 64* 116* 87 104* 96   Lipid Profile: No results for input(s): CHOL, HDL, LDLCALC, TRIG, CHOLHDL, LDLDIRECT in the last 72 hours. Thyroid Function Tests: No results for input(s): TSH, T4TOTAL, FREET4, T3FREE, THYROIDAB in the last 72 hours. Anemia Panel: No results for input(s): VITAMINB12, FOLATE, FERRITIN, TIBC, IRON, RETICCTPCT in the last 72 hours. Urine analysis:    Component Value Date/Time   COLORURINE AMBER (A) 02/04/2020 2316   APPEARANCEUR HAZY (A) 02/04/2020 2316   LABSPEC 1.021 02/04/2020 2316   PHURINE 5.0 02/04/2020 2316   GLUCOSEU  NEGATIVE 02/04/2020 2316   HGBUR NEGATIVE 02/04/2020 2316   BILIRUBINUR NEGATIVE 02/04/2020 2316   KETONESUR 5 (A) 02/04/2020 2316   PROTEINUR 100 (A) 02/04/2020 2316   UROBILINOGEN 0.2 09/17/2012 0358   NITRITE NEGATIVE 02/04/2020 2316   LEUKOCYTESUR NEGATIVE 02/04/2020 2316   Recent Results (from the past 240 hour(s))  Culture, blood (Routine x 2)     Status: None (Preliminary result)   Collection Time: 02/04/20  1:25 PM   Specimen: BLOOD RIGHT ARM  Result Value Ref Range Status   Specimen Description BLOOD RIGHT ARM  Final   Special Requests   Final    BOTTLES DRAWN AEROBIC AND ANAEROBIC Blood Culture adequate volume   Culture   Final    NO GROWTH 3 DAYS Performed at [facility], [place] [address], [place], [place] [address]    Report Status PENDING  Incomplete  Urine culture     Status: None   Collection Time: 02/04/20  1:32 PM   Specimen: In/Out Cath Urine  Result Value Ref Range Status   Specimen Description IN/OUT CATH URINE  Final   Special Requests NONE  Final   Culture   Final    NO GROWTH Performed at [facility], [place] [address], [place], [place] [address]    Report Status 02/06/2020 FINAL  Final  Culture, blood (Routine x 2)     Status: None (Preliminary result)   Collection Time: 02/04/20  2:00 PM   Specimen: BLOOD  Result Value Ref Range Status   Specimen Description BLOOD RIGHT ANTECUBITAL  Final   Special Requests   Final    BOTTLES DRAWN AEROBIC AND ANAEROBIC Blood Culture results may not be optimal due to an excessive volume of blood received in culture bottles   Culture   Final    NO GROWTH 3 DAYS Performed at [facility], [place] [address], [place], [place] [address]    Report Status PENDING  Incomplete  Respiratory Panel by RT PCR (Flu A&B, Covid) - Nasopharyngeal Swab     Status: None   Collection Time: 02/04/20  3:33 PM   Specimen: Nasopharyngeal Swab  Result Value Ref Range Status   SARS Coronavirus 2 by RT PCR NEGATIVE NEGATIVE  Final    Comment: (NOTE) SARS-CoV-2 target nucleic acids are NOT DETECTED. The SARS-CoV-2 RNA is  generally detectable in upper respiratoy specimens during the acute phase of infection. The lowest concentration of SARS-CoV-2 viral copies this assay can detect is 131 copies/mL. A negative result does not preclude SARS-Cov-2 infection and should not be used as the sole basis for treatment or other patient management decisions. A negative result may occur with  improper specimen collection/handling, submission of specimen other than nasopharyngeal swab, presence of viral mutation(s) within the areas targeted by this assay, and inadequate number of viral copies (<131 copies/mL). A negative result must be combined with clinical observations, patient history, and epidemiological information. The expected result is Negative. Fact Sheet for Patients:  PinkCheek.be Fact Sheet for Healthcare Providers:  GravelBags.it This test is not yet ap proved or cleared by the [place] FDA and  has been authorized for detection and/or diagnosis of SARS-CoV-2 by FDA under an Emergency Use Authorization (EUA). This EUA will remain  in effect (meaning this test can be used) for the duration of the COVID-19 declaration under Section 564(b)(1) of the Act, 21 U.S.C. section 360bbb-3(b)(1), unless the authorization is terminated or revoked sooner.    Influenza A by PCR NEGATIVE NEGATIVE Final   Influenza B by PCR NEGATIVE NEGATIVE Final    Comment: (NOTE) The Xpert Xpress SARS-CoV-2/FLU/RSV assay is intended as an aid in  the diagnosis of influenza from Nasopharyngeal swab specimens and  should not be used as a sole basis for treatment. Nasal washings and  aspirates are unacceptable for Xpert Xpress SARS-CoV-2/FLU/RSV  testing. Fact Sheet for Patients: PinkCheek.be Fact Sheet for Healthcare Providers:  GravelBags.it This test is not yet approved or cleared by the [place] FDA and  has been authorized for detection and/or diagnosis of SARS-CoV-2 by  FDA under an Emergency Use Authorization (EUA). This EUA will remain  in effect (meaning this test can be used) for the duration of the  Covid-19 declaration under Section 564(b)(1) of the Act, 21  U.S.C. section 360bbb-3(b)(1), unless the authorization is  terminated or revoked. Performed at [facility], [place] [address] [place], [place] [address]   MRSA PCR Screening     Status: None   Collection Time: 02/04/20 11:47 PM   Specimen: Nasal Mucosa; Nasopharyngeal  Result Value Ref Range Status   MRSA by PCR NEGATIVE NEGATIVE Final    Comment:        The GeneXpert MRSA Assay (FDA approved for NASAL specimens only), is one component of a comprehensive MRSA colonization surveillance program. It is not intended to diagnose MRSA infection nor to guide or monitor treatment for MRSA infections. Performed at [facility], [place] [address] [place], [place] [address]       Radiology Studies: DG Abd Portable 1V-Small Bowel Obstruction Protocol-initial, 8 hr delay  Result Date: 02/06/2020 CLINICAL DATA:  Small-bowel obstruction EXAM: PORTABLE ABDOMEN - 1 VIEW COMPARISON:  Feb 05, 2020 FINDINGS: The enteric tube projects over the left upper quadrant. Oral contrast is noted in the small bowel and colon to the level of the rectum. Mildly dilated loops of small bowel are again noted in the left abdomen. Advanced aortic calcifications are noted. IMPRESSION: 1. Oral contrast is noted in the small bowel and colon to the level of the rectum. 2. Enteric tube projects over the left upper quadrant. 3. Persistent mildly dilated loops of small bowel are noted in the left mid abdomen. Electronically Signed   By: Constance Holster M.D.   On: 02/06/2020 18:50    Scheduled Meds: . Chlorhexidine  Gluconate Cloth  6 each  Topical [id number]  . levothyroxine  50 mcg Intravenous Daily  . pantoprazole (PROTONIX) IV  40 mg Intravenous Q24H   Continuous Infusions: . sodium chloride Stopped (02/06/20 0117)  . dextrose 5 % and 0.45 % NaCl with KCl 40 mEq/L 50 mL/hr at 02/06/20 2200  . heparin 1,000 Units/hr (02/07/20 1309)  . piperacillin-tazobactam (ZOSYN)  IV 3.375 g (02/07/20 0603)     LOS: 3 days   Time spent: 25 minutes.  Patrecia Pour, MD [facility] [email or web address] 02/07/2020, 2:26 PM

## 2020-02-07 NOTE — Progress Notes (Signed)
ANTICOAGULATION CONSULT NOTE - Initial Consult  Pharmacy Consult for Eliquis Indication: atrial fibrillation  No Known Allergies  Patient Measurements: Height: 5\' 8"  (172.7 cm) Weight: 70.2 kg (154 lb 12.2 oz) IBW/kg (Calculated) : 68.4 Heparin Dosing Weight:    Vital Signs: Temp: 97.9 F (36.6 C) (05/04 1030) Temp Source: Oral (05/04 1030) BP: 154/75 (05/04 1421) Pulse Rate: 102 (05/04 1421)  Labs: Recent Labs    02/05/20 0048 02/05/20 0624 02/06/20 0431 02/06/20 1131 02/07/20 0411  HGB 10.0*  --  8.9*  --  9.5*  HCT 30.3*  --  27.8*  --  29.9*  PLT 217  --  147*  --  176  APTT 52*   < > 99* 84* 73*  HEPARINUNFRC 1.03*  --  0.69  --  0.33  CREATININE 2.17*  --  1.89*  --  1.64*   < > = values in this interval not displayed.    Estimated Creatinine Clearance: 27.2 mL/min (A) (by C-G formula based on SCr of 1.64 mg/dL (H)).   Medical History: Past Medical History:  Diagnosis Date  . Aortic stenosis 05/27/12   Severe to critical  . Arthritis    OSTEO OF KNEE  . Atrial fibrillation ([place]) 09/21/2012   Post operative after bypass surgery converting on amiodarone   . Bifascicular bundle branch block 02/09/2018  . Borderline diabetes   . BPH (benign prostatic hypertrophy)   . Coronary artery disease   . GI bleed   . Heart murmur   . Hypercholesteremia   . Hypertension   . Hyponatremia 07/23/2016  . Hypothyroidism 07/23/2016  . S/P aortic valve replacement with bioprosthetic valve    #23 bioprosthetic aortic valve December 2013   . S/P CABG (coronary artery bypass graft) 09/16/2012   Aortic valve replacement with a pericardial tissue  valve, [employer] 23 mm [id number] and coronary artery bypass grafting x3  with the left internal mammary to the left anterior descending coronary  artery, reverse saphenous vein graft to the circumflex coronary artery,  and reverse saphenous vein graft to the distal right coronary artery  with right leg endo vein harvesting.   09/17/2012   . SIADH (syndrome of inappropriate ADH production) ([place]) 07/23/2016  . Ulcer    PEPTIC ULCER DZ...DR. Watt Climes   Assessment: Anticoag: Afib on Eliquis PTA (LD 4/30 PM) for afib >> Heparin d/t possible SBO/procedures. Hgb 9.5 up. Plts 176 ok. Resume Eliquis 5/4.  Goal of Therapy:  Therapeutic oral anticoagulation  Plan:  D/c IV heparin tonight then  Resume Eliquis 2.5mg  BID as PTA   Enis Riecke S. Alford Highland, PharmD, BCPS Clinical Staff Pharmacist [email or web address] Alford Highland, [place] Henegar Stillinger 02/07/2020,2:44 PM

## 2020-02-07 NOTE — NC FL2 (Signed)
[place] LEVEL OF CARE SCREENING TOOL     IDENTIFICATION  Patient Name: [name] Birthdate: 03/08/1924 Sex: male Admission Date (Current Location): 02/04/2020  [facility] and [place] Number:  Herbalist and Address:  [facility] [place], [place] [address], [place], [place] [address]      Provider Number: [id number]  Attending Physician Name and Address:  Patrecia Pour, MD  Relative Name and Phone Number:       Current Level of Care: Hospital Recommended Level of Care: [place] Prior Approval Number:    Date Approved/Denied:   PASRR Number: [id number]  Discharge Plan: SNF    Current Diagnoses: Patient Active Problem List   Diagnosis Date Noted  . Pressure injury of skin 02/06/2020  . Community acquired pneumonia of right lung   . SBO (small bowel obstruction) ([place]) 02/04/2020  . Aspiration pneumonia ([place]) 02/04/2020  . Sepsis ([place]) 02/04/2020  . AKI (acute kidney injury) ([place]) 02/04/2020  . CKD (chronic kidney disease) stage 2, GFR 60-89 ml/min 02/04/2020  . Septic shock ([place]) 02/04/2020  . Bifascicular bundle branch block 02/09/2018  . Closed fracture of right proximal humerus 09/04/2017  . Closed fracture of lower end of left radius with routine healing 12/15/2016  . SIADH (syndrome of inappropriate ADH production) ([place]) 07/23/2016  . Hyponatremia 07/23/2016  . Hypothyroidism 07/23/2016  . Atrial flutter ([place]) 01/16/2015  . Essential hypertension 01/16/2014  . Atrial fibrillation ([place]) 09/21/2012  . S/P CABG (coronary artery bypass graft) 09/16/2012  . Ulcer   . BPH (benign prostatic hypertrophy)   . Arthritis   . S/P aortic valve replacement with bioprosthetic valve     Orientation RESPIRATION BLADDER Height & Weight     Self, Place  O2(nasal canula) Incontinent, Indwelling catheter(foley) Weight: 154 lb 12.2 oz (70.2 kg) Height:  5\' 8"  (172.7 cm)  BEHAVIORAL SYMPTOMS/MOOD NEUROLOGICAL BOWEL  NUTRITION STATUS      Continent Diet(see discharge summary)  AMBULATORY STATUS COMMUNICATION OF NEEDS Skin   Extensive Assist Verbally PU Stage and Appropriate Care PU Stage 1 Dressing: (foam dressing)                     Personal Care Assistance Level of Assistance  Bathing, Feeding, Dressing Bathing Assistance: Maximum assistance Feeding assistance: Independent Dressing Assistance: Maximum assistance     Functional Limitations Info  Sight, Hearing, Speech Sight Info: Impaired Hearing Info: Adequate Speech Info: Adequate    SPECIAL CARE FACTORS FREQUENCY  OT (By licensed OT), PT (By licensed PT)     PT Frequency: 5x week OT Frequency: 5x week            Contractures Contractures Info: Not present    Additional Factors Info  Code Status, Allergies Code Status Info: DNR Allergies Info: No Known Allergies           Current Medications (02/07/2020):  This is the current hospital active medication list Current Facility-Administered Medications  Medication Dose Route Frequency Provider Last Rate Last Admin  . 0.9 %  sodium chloride infusion  250 mL Intravenous Continuous Gleason, Otilio Carpen, PA-C   Stopped at 02/06/20 0117  . apixaban (ELIQUIS) tablet 2.5 mg  2.5 mg Oral BID Karren Cobble, [place]      . Chlorhexidine Gluconate Cloth 2 % PADS 6 each  6 each Topical Q0600 Milagros Loll, MD   6 each at 02/07/20 [phone]  . heparin ADULT infusion 100 units/mL (25000 units/241mL  sodium chloride 0.45%)  1,000 Units/hr Intravenous Continuous Karren Cobble, RPH 10 mL/hr at 02/07/20 1309 1,000 Units/hr at 02/07/20 1309  . [START ON 02/08/2020] levothyroxine (SYNTHROID) tablet 100 mcg  100 mcg Oral Q0600 Patrecia Pour, MD      . ondansetron [facility]) injection 4 mg  4 mg Intravenous Q6H PRN Gleason, Otilio Carpen, PA-C      . pantoprazole (PROTONIX) injection 40 mg  40 mg Intravenous Q24H Gleason, Otilio Carpen, PA-C   40 mg at 02/06/20 2159  . piperacillin-tazobactam (ZOSYN) IVPB  3.375 g  3.375 g Intravenous Q8H Spero Geralds, MD 12.5 mL/hr at 02/07/20 1504 3.375 g at 02/07/20 1504  . polyethylene glycol (MIRALAX / GLYCOLAX) packet 17 g  17 g Oral Daily PRN Gleason, Otilio Carpen, PA-C         Discharge Medications: Please see discharge summary for a list of discharge medications.  Relevant Imaging Results:  Relevant Lab Results:   Additional Information SS#[id number] [place], [place]

## 2020-02-07 NOTE — Evaluation (Signed)
Physical Therapy Evaluation Patient Details Name: [name] MRN: [id number] DOB: Jun 08, 1924 Today's Date: 02/07/2020   History of Present Illness  Pt is a 86 y/o male admitted secondary to partial SBO vs ileus and suspected aspiration PNA with septic shock and AKI. PMH includes s/p AVR, CAD s/p CABG, a fib, CKD, and HTN.    Clinical Impression  Pt admitted secondary to problem above with deficits below. Pt requiring min to mod A to stand and transfer to chair using RW. Pt stood multiple times this session, as he had incontinence of stool upon standing. Required assist for clean up. Feel pt would benefit from short term SNF prior to return home in order to maximize functional mobility independence and safety. However, may progress well and be able to d/c home with family assist. Will continue to follow acutely to maximize functional mobility independence and safety.     Follow Up Recommendations SNF;Supervision/Assistance - 24 hour    Equipment Recommendations  Rolling walker with 5" wheels    Recommendations for Other Services       Precautions / Restrictions Precautions Precautions: Fall Restrictions Weight Bearing Restrictions: No      Mobility  Bed Mobility Overal bed mobility: Needs Assistance Bed Mobility: Supine to Sit     Supine to sit: Mod assist     General bed mobility comments: Mod A for LE and trunk assist to come to sitting. Required increased time to perform.   Transfers Overall transfer level: Needs assistance Equipment used: Rolling walker (2 wheeled) Transfers: Sit to/from [facility] Sit to Stand: Mod assist;Min assist Stand pivot transfers: Mod assist;Min assist       General transfer comment: Initially requiring mod A to stand, however, with practice able to progress to min A. Pt with incontenence of stool, and required assist with clean up in standing. Stood X3. min to mod A for steadying assist to transfer to chair. Required  increased time and cues for sequencing using RW.   Ambulation/Gait                Stairs            Wheelchair Mobility    Modified Rankin (Stroke Patients Only)       Balance Overall balance assessment: Needs assistance Sitting-balance support: No upper extremity supported;Feet supported Sitting balance-Leahy Scale: Fair     Standing balance support: Bilateral upper extremity supported;During functional activity Standing balance-Leahy Scale: Poor Standing balance comment: Reliant on BUE support.                              Pertinent Vitals/Pain Pain Assessment: Faces Faces Pain Scale: Hurts a little bit Pain Location: generalized Pain Descriptors / Indicators: Grimacing;Guarding Pain Intervention(s): Monitored during session;Limited activity within patient's tolerance;Repositioned    Home Living Family/patient expects to be discharged to:: Private residence Living Arrangements: Spouse/significant other Available Help at Discharge: Family;Available 24 hours/day Type of Home: House Home Access: Stairs to enter Entrance Stairs-Rails: Left Entrance Stairs-Number of Steps: 1 Home Layout: One level Home Equipment: Latina Craver - 2 wheels      Prior Function Level of Independence: Independent               Hand Dominance        Extremity/Trunk Assessment   Upper Extremity Assessment Upper Extremity Assessment: Defer to OT evaluation    Lower Extremity Assessment Lower Extremity Assessment: Generalized weakness  Cervical / Trunk Assessment Cervical / Trunk Assessment: Kyphotic  Communication   Communication: HOH  Cognition Arousal/Alertness: Awake/alert Behavior During Therapy: WFL for tasks assessed/performed Overall Cognitive Status: History of cognitive impairments - at baseline                                        General Comments General comments (skin integrity, edema, etc.): Pt's daughter  and wife present during session    Exercises     Assessment/Plan    PT Assessment Patient needs continued PT services  PT Problem List Decreased strength;Decreased balance;Decreased mobility;Decreased activity tolerance;Decreased knowledge of use of DME;Decreased cognition;Decreased safety awareness;Decreased knowledge of precautions       PT Treatment Interventions DME instruction;Gait training;Stair training;Functional mobility training;Therapeutic exercise;Therapeutic activities;Balance training;Patient/family education;Cognitive remediation    PT Goals (Current goals can be found in the Care Plan section)  Acute Rehab PT Goals Patient Stated Goal: to go home PT Goal Formulation: With patient/family Time For Goal Achievement: 02/21/20 Potential to Achieve Goals: Fair    Frequency Min 3X/week   Barriers to discharge        Co-evaluation               AM-PAC PT "6 Clicks" Mobility  Outcome Measure Help needed turning from your back to your side while in a flat bed without using bedrails?: A Lot Help needed moving from lying on your back to sitting on the side of a flat bed without using bedrails?: A Lot Help needed moving to and from a bed to a chair (including a wheelchair)?: A Lot Help needed standing up from a chair using your arms (e.g., wheelchair or bedside chair)?: A Lot Help needed to walk in hospital room?: A Lot Help needed climbing 3-5 steps with a railing? : A Lot 6 Click Score: 12    End of Session Equipment Utilized During Treatment: Gait belt Activity Tolerance: Patient tolerated treatment well Patient left: in chair;with call bell/phone within reach;with nursing/sitter in room;with family/visitor present Nurse Communication: Mobility status PT Visit Diagnosis: Unsteadiness on feet (R26.81);Muscle weakness (generalized) (M62.81);Difficulty in walking, not elsewhere classified (R26.2)    Time: [id number] PT Time Calculation (min) (ACUTE ONLY): 49  min   Charges:   PT Evaluation $PT Eval Moderate Complexity: 1 Mod PT Treatments $Therapeutic Activity: 23-37 mins        Lou Miner, DPT  Acute Rehabilitation Services  Pager: [phone] Office: [phone]   Rudean Hitt 02/07/2020, 4:35 PM

## 2020-02-07 NOTE — Progress Notes (Signed)
Patient ID: [name], male   DOB: Dec 07, 1923, 86 y.o.   MRN: [id number]       Subjective: Patient feels well today.  Passing flatus he thinks, unsure if he has had a BM.  NGT with minimal output  ROS: See above, otherwise other systems negative  Objective: Vital signs in last 24 hours: Temp:  [97.4 F (36.3 C)-98.5 F (36.9 C)] 97.4 F (36.3 C) (05/04 0407) Pulse Rate:  [58-106] 91 (05/04 0407) Resp:  [17-34] 18 (05/04 0407) BP: (110-134)/(56-85) 134/71 (05/04 0407) SpO2:  [91 %-100 %] 99 % (05/04 0407) Weight:  [70.2 kg] 70.2 kg (05/04 0429) Last BM Date: 02/06/20  Intake/Output from previous day: 05/03 0701 - 05/04 0700 In: 1428.5 [I.V.:828.2; NG/GT:110; IV Piggyback:490.2] Out: 725 [Urine:575; Emesis/NG output:150] Intake/Output this shift: No intake/output data recorded.  PE: Abd: soft, +BS, NGT with no output essentially, ND, NT  Lab Results:  Recent Labs    02/06/20 0431 02/07/20 0411  WBC 12.8* 12.1*  HGB 8.9* 9.5*  HCT 27.8* 29.9*  PLT 147* 176   BMET Recent Labs    02/06/20 0431 02/07/20 0411  NA 140 140  K 3.1* 3.2*  CL 99 106  CO2 28 27  GLUCOSE 77 107*  BUN 66* 53*  CREATININE 1.89* 1.64*  CALCIUM 9.3 9.7   PT/INR Recent Labs    02/04/20 1325  LABPROT 18.3*  INR 1.6*   CMP     Component Value Date/Time   NA 140 02/07/2020 0411   NA 141 01/20/2020 1217   K 3.2 (L) 02/07/2020 0411   CL 106 02/07/2020 0411   CO2 27 02/07/2020 0411   GLUCOSE 107 (H) 02/07/2020 0411   BUN 53 (H) 02/07/2020 0411   BUN 22 01/20/2020 1217   CREATININE 1.64 (H) 02/07/2020 0411   CALCIUM 9.7 02/07/2020 0411   PROT 7.9 02/04/2020 1325   PROT 7.8 08/10/2019 1411   ALBUMIN 3.4 (L) 02/04/2020 1325   ALBUMIN 3.9 08/10/2019 1411   AST 20 02/04/2020 1325   ALT 14 02/04/2020 1325   ALKPHOS 53 02/04/2020 1325   BILITOT 1.7 (H) 02/04/2020 1325   BILITOT 0.7 08/10/2019 1411   GFRNONAA 36 (L) 02/07/2020 0411   GFRAA 41 (L) 02/07/2020 0411   Lipase     Component Value Date/Time   LIPASE 29 11/10/2011 0745       Studies/Results: DG Abd Portable 1V-Small Bowel Obstruction Protocol-initial, 8 hr delay  Result Date: 02/06/2020 CLINICAL DATA:  Small-bowel obstruction EXAM: PORTABLE ABDOMEN - 1 VIEW COMPARISON:  Feb 05, 2020 FINDINGS: The enteric tube projects over the left upper quadrant. Oral contrast is noted in the small bowel and colon to the level of the rectum. Mildly dilated loops of small bowel are again noted in the left abdomen. Advanced aortic calcifications are noted. IMPRESSION: 1. Oral contrast is noted in the small bowel and colon to the level of the rectum. 2. Enteric tube projects over the left upper quadrant. 3. Persistent mildly dilated loops of small bowel are noted in the left mid abdomen. Electronically Signed   By: Constance Holster M.D.   On: 02/06/2020 18:50   ECHOCARDIOGRAM COMPLETE  Result Date: 02/05/2020    ECHOCARDIOGRAM REPORT   Patient Name:   [name] Date of Exam: 02/05/2020 Medical Rec #:  [id number]       Height:       68.0 in Accession #:    [id number]      Weight:  154.8 lb Date of Birth:  [birth year]       BSA:          1.833 m Patient Age:    10 years        BP:           117/59 mmHg Patient Gender: M               HR:           89 bpm. Exam Location:  Inpatient Procedure: 2D Echo Indications:    Hypotension [id number]  History:        Patient has prior history of Echocardiogram examinations, most                 recent 03/30/2015. Prior CABG and S/P aortic valve replacement                 with bioprosthetic valve, Arrythmias:Atrial Fibrillation; Risk                 Factors:Hypertension. Sepsis                 CKD.                 Aortic Valve: valve is present in the aortic position.  Sonographer:    Vikki Ports Turrentine Referring Phys: [id number] [place]  1. Normal LV systolic function; mild LVH; s/p AVR with mean gradient 8 mmHg and trace AI; mild MR; biatrial enlargement; mild TR.  2. Left  ventricular ejection fraction, by estimation, is 60 to 65%. The left ventricle has normal function. The left ventricle has no regional wall motion abnormalities. There is mild left ventricular hypertrophy. Left ventricular diastolic function could not be evaluated.  3. Right ventricular systolic function is normal. The right ventricular size is normal. There is normal pulmonary artery systolic pressure.  4. Left atrial size was severely dilated.  5. Right atrial size was severely dilated.  6. The mitral valve is normal in structure. Mild mitral valve regurgitation. No evidence of mitral stenosis.  7. The aortic valve has been repaired/replaced. Aortic valve regurgitation is trivial. No aortic stenosis is present. There is a valve present in the aortic position.  8. The inferior vena cava is normal in size with greater than 50% respiratory variability, suggesting right atrial pressure of 3 mmHg. FINDINGS  Left Ventricle: Left ventricular ejection fraction, by estimation, is 60 to 65%. The left ventricle has normal function. The left ventricle has no regional wall motion abnormalities. The left ventricular internal cavity size was normal in size. There is  mild left ventricular hypertrophy. Left ventricular diastolic function could not be evaluated due to atrial fibrillation. Left ventricular diastolic function could not be evaluated. Right Ventricle: The right ventricular size is normal. Right ventricular systolic function is normal. There is normal pulmonary artery systolic pressure. The tricuspid regurgitant velocity is 2.48 m/s, and with an assumed right atrial pressure of 8 mmHg,  the estimated right ventricular systolic pressure is [id number] mmHg. Left Atrium: Left atrial size was severely dilated. Right Atrium: Right atrial size was severely dilated. Pericardium: There is no evidence of pericardial effusion. Mitral Valve: The mitral valve is normal in structure. Normal mobility of the mitral valve leaflets. Moderate  mitral annular calcification. Mild mitral valve regurgitation. No evidence of mitral valve stenosis. Tricuspid Valve: The tricuspid valve is normal in structure. Tricuspid valve regurgitation is mild . No evidence of tricuspid stenosis. Aortic Valve: The aortic valve has been  repaired/replaced. Aortic valve regurgitation is trivial. No aortic stenosis is present. Aortic valve mean gradient measures 8.0 mmHg. Aortic valve peak gradient measures 15.3 mmHg. Aortic valve area, by VTI measures 2.53 cm. There is a valve present in the aortic position. Pulmonic Valve: The pulmonic valve was normal in structure. Pulmonic valve regurgitation is not visualized. No evidence of pulmonic stenosis. Aorta: The aortic root is normal in size and structure. Venous: The inferior vena cava is normal in size with greater than 50% respiratory variability, suggesting right atrial pressure of 3 mmHg. IAS/Shunts: No atrial level shunt detected by color flow Doppler. Additional Comments: Normal LV systolic function; mild LVH; s/p AVR with mean gradient 8 mmHg and trace AI; mild MR; biatrial enlargement; mild TR.  LEFT VENTRICLE PLAX 2D LVIDd:         3.80 cm LVIDs:         2.50 cm LV PW:         1.30 cm LV IVS:        1.30 cm LVOT diam:     1.90 cm LV SV:         89 LV SV Index:   49 LVOT Area:     2.84 cm  RIGHT VENTRICLE RV S prime:     6.85 cm/s TAPSE (M-mode): 0.8 cm LEFT ATRIUM           Index       RIGHT ATRIUM           Index LA diam:      5.20 cm 2.84 cm/m  RA Area:     28.80 cm LA Vol (A4C): 72.9 ml 39.78 ml/m RA Volume:   105.00 ml 57.29 ml/m  AORTIC VALVE AV Area (Vmax):    2.35 cm AV Area (Vmean):   2.56 cm AV Area (VTI):     2.53 cm AV Vmax:           195.50 cm/s AV Vmean:          134.500 cm/s AV VTI:            0.353 m AV Peak Grad:      15.3 mmHg AV Mean Grad:      8.0 mmHg LVOT Vmax:         162.00 cm/s LVOT Vmean:        121.400 cm/s LVOT VTI:          0.314 m LVOT/AV VTI ratio: 0.89  AORTA Ao Root diam: 2.70 cm  MITRAL VALVE                TRICUSPID VALVE MV Area (PHT): 3.89 cm     TR Peak grad:   24.6 mmHg MV Decel Time: 195 msec     TR Vmax:        248.00 cm/s MV E velocity: 146.33 cm/s                             SHUNTS                             Systemic VTI:  0.31 m                             Systemic Diam: 1.90 cm Kirk Ruths MD Electronically signed by Kirk Ruths MD Signature Date/Time: 02/05/2020/2:41:25 PM  Final     Anti-infectives: Anti-infectives (From admission, onward)   Start     Dose/Rate Route Frequency Ordered Stop   02/06/20 1600  vancomycin (VANCOCIN) IVPB 1000 mg/200 mL premix  Status:  Discontinued     1,000 mg 200 mL/hr over 60 Minutes Intravenous Every 48 hours 02/04/20 1517 02/05/20 1124   02/05/20 1600  ceFEPIme (MAXIPIME) 2 g in sodium chloride 0.9 % 100 mL IVPB  Status:  Discontinued     2 g 200 mL/hr over 30 Minutes Intravenous Every 24 hours 02/04/20 1517 02/05/20 1124   02/05/20 1500  piperacillin-tazobactam (ZOSYN) IVPB 3.375 g     3.375 g 12.5 mL/hr over 240 Minutes Intravenous Every 8 hours 02/05/20 1124     02/04/20 2100  metroNIDAZOLE (FLAGYL) IVPB 500 mg  Status:  Discontinued     500 mg 100 mL/hr over 60 Minutes Intravenous Every 8 hours 02/04/20 2058 02/05/20 1124   02/04/20 1345  ceFEPIme (MAXIPIME) 2 g in sodium chloride 0.9 % 100 mL IVPB     2 g 200 mL/hr over 30 Minutes Intravenous  Once 02/04/20 1334 02/04/20 1611   02/04/20 1345  metroNIDAZOLE (FLAGYL) IVPB 500 mg     500 mg 100 mL/hr over 60 Minutes Intravenous  Once 02/04/20 1334 02/04/20 1507   02/04/20 1345  vancomycin (VANCOCIN) IVPB 1000 mg/200 mL premix     1,000 mg 200 mL/hr over 60 Minutes Intravenous  Once 02/04/20 1334 02/04/20 1503       Assessment/Plan atrial fibrillation on Eliquis aortic stenosis CADs/p CABG in 2013 HTN Hypothyroidism AKI Lactic acidosis - LA trending down 2.4 Hypokalemia - 3.2, per medicine Aspiration PNA - per medicine  PartialSBOvs  ileus -SBO protocol with contrast all the way to the patient's rectum. -DC NGT -adv to FLD, then as tolerates -may resume eliquis -no plans for surgical intervention  ID -mazipime/flagyl/vanco 5/1>> FEN -FLD, ADAT VTE -IV heparin Foley -in place   LOS: 3 days    Henreitta Cea , [facility] 02/07/2020, 9:49 AM Please see Amion for pager number during day hours 7:00am-4:30pm or 7:00am -11:30am on weekends

## 2020-02-07 NOTE — Care Management Important Message (Signed)
Important Message  Patient Details  Name: [name] MRN: [id number] Date of Birth: 1923/12/11   Medicare Important Message Given:  Yes     Azyah Flett Montine Circle 02/07/2020, 3:03 PM

## 2020-02-08 ENCOUNTER — Inpatient Hospital Stay (HOSPITAL_COMMUNITY): Payer: Medicare Other

## 2020-02-08 LAB — BASIC METABOLIC PANEL
Anion gap: 9 (ref 5–15)
BUN: 44 mg/dL — ABNORMAL HIGH (ref 8–23)
CO2: 25 mmol/L (ref 22–32)
Calcium: 9.5 mg/dL (ref 8.9–10.3)
Chloride: 103 mmol/L (ref 98–111)
Creatinine, Ser: 1.34 mg/dL — ABNORMAL HIGH (ref 0.61–1.24)
GFR calc Af Amer: 53 mL/min — ABNORMAL LOW (ref >60)
GFR calc non Af Amer: 45 mL/min — ABNORMAL LOW (ref >60)
Glucose, Bld: 106 mg/dL — ABNORMAL HIGH (ref 70–99)
Potassium: 4.1 mmol/L (ref 3.5–5.1)
Sodium: 137 mmol/L (ref 135–145)

## 2020-02-08 LAB — SARS CORONAVIRUS 2 (TAT 6-24 HRS): SARS Coronavirus 2: NEGATIVE

## 2020-02-08 LAB — GLUCOSE, CAPILLARY
Glucose-Capillary: 137 mg/dL — ABNORMAL HIGH (ref 70–99)
Glucose-Capillary: 259 mg/dL — ABNORMAL HIGH (ref 70–99)
Glucose-Capillary: 94 mg/dL (ref 70–99)
Glucose-Capillary: 97 mg/dL (ref 70–99)
Glucose-Capillary: 99 mg/dL (ref 70–99)

## 2020-02-08 MED ORDER — LABETALOL HCL 5 MG/ML IV SOLN: 5.0000 mg | INTRAVENOUS | Status: DC | PRN

## 2020-02-08 MED ORDER — LACTATED RINGERS IV SOLN: INTRAVENOUS | Status: DC

## 2020-02-08 MED ORDER — PANTOPRAZOLE SODIUM 40 MG PO TBEC
40.0000 mg | DELAYED_RELEASE_TABLET | Freq: Every day | ORAL | Status: DC
  Administered 2020-02-08 – 2020-02-15 (×8): 40 mg via ORAL
  Filled 2020-02-08 (×8): qty 1

## 2020-02-08 MED ORDER — METOPROLOL SUCCINATE ER 25 MG PO TB24
25.0000 mg | ORAL_TABLET | Freq: Every day | ORAL | Status: DC
  Administered 2020-02-08 – 2020-02-15 (×8): 25 mg via ORAL
  Filled 2020-02-08 (×8): qty 1

## 2020-02-08 MED ORDER — FUROSEMIDE 10 MG/ML IJ SOLN
40.0000 mg | Freq: Once | INTRAMUSCULAR | Status: AC
  Administered 2020-02-08: 40 mg via INTRAVENOUS
  Filled 2020-02-08: qty 4

## 2020-02-08 NOTE — Progress Notes (Signed)
Physical Therapy Treatment Patient Details Name: [name] MRN: [id number] DOB: 10-Dec-1923 Today's Date: 02/08/2020    History of Present Illness Pt is a 86 y/o male admitted secondary to partial SBO vs ileus and suspected aspiration PNA with septic shock and AKI. PMH includes s/p AVR, CAD s/p CABG, a fib, CKD, and HTN.      PT Comments    Pt required mod(A) bed mobility, mod-max(A) sit to stand +2, min(A) +2 for stand pivot transfer to recliner. Pt 100% spo2 at rest 3L RA, 84% spo2 RA with transfer EOB, O2 replaced and >88% 3L RA in recliner.  Pt required increased verbal and tactile cues for UE/LE placement with transfers and bed mobility and use of RW. Pt better able to follow one step commands for mobility and required frequent verbal and tactile cueing for attention.  Pt remains appropriate for physical therapy while in acute care to improve generalized weakness and functional mobility until d/c to next level of care.   Spo2 100% 3L at rest Spo2  84% RA with transfer EOB Spo2 >88% 3L O2 in recliner  Follow Up Recommendations  SNF;Supervision/Assistance - 24 hour     Equipment Recommendations  Rolling walker with 5" wheels    Recommendations for Other Services       Precautions / Restrictions Precautions Precautions: Fall;Other (comment) Precaution Comments: Bowel/bladder incontience Restrictions Weight Bearing Restrictions: No    Mobility  Bed Mobility Overal bed mobility: Needs Assistance Bed Mobility: Supine to Sit     Supine to sit: Mod assist     General bed mobility comments: able to initate LB and trunk movement, but requires mod assist to complete scooting and trunk elevation to EOB   Transfers Overall transfer level: Needs assistance Equipment used: Rolling walker (2 wheeled) Transfers: Sit to/from Stand Sit to Stand: +2 physical assistance;Max assist;Mod assist         General transfer comment: Pt required max(A) +2 sit to stand  RW, pt had  bowel movement and urinated in standing. Had to sit down to clean up. Sit to stand second time mod(A) +2 RW. Pt required VC for hip and trunk extension in standing.  Ambulation/Gait Ambulation/Gait assistance: +2 safety/equipment;Min assist Gait Distance (Feet): 2 Feet Assistive device: Rolling walker (2 wheeled) Gait Pattern/deviations: Step-to pattern;Decreased stride length;Wide base of support;Trunk flexed     General Gait Details: Pt required min(A) +2 stand pivot transfer with 2 ft of steps to recliner.   Stairs             Wheelchair Mobility    Modified Rankin (Stroke Patients Only)       Balance Overall balance assessment: Needs assistance Sitting-balance support: Bilateral upper extremity supported;Feet supported Sitting balance-Leahy Scale: Fair     Standing balance support: Bilateral upper extremity supported;During functional activity Standing balance-Leahy Scale: Poor Standing balance comment: Pt required walker support and mod(A)-max(A) to maintain standing                            Cognition Arousal/Alertness: Awake/alert Behavior During Therapy: WFL for tasks assessed/performed Overall Cognitive Status: History of cognitive impairments - at baseline Area of Impairment: Following commands;Problem solving;Awareness;Attention                   Current Attention Level: Sustained   Following Commands: Follows one step commands consistently;Follows multi-step commands inconsistently     Problem Solving: Slow processing;Decreased initiation;Requires verbal cues;Requires tactile cues General  Comments: pt history of cognitive impairments at baseline. pt demonstrated poor sustained attention, required verbal and tactile cues for mobility, answering questions.      Exercises      General Comments General comments (skin integrity, edema, etc.): pt reports he has a daughter and son that live close by.      Pertinent Vitals/Pain Pain  Assessment: Faces Faces Pain Scale: Hurts a little bit Pain Location: generalized Pain Descriptors / Indicators: Grimacing;Guarding;Discomfort Pain Intervention(s): Limited activity within patient's tolerance;Monitored during session;Repositioned    Home Living                      Prior Function            PT Goals (current goals can now be found in the care plan section) Acute Rehab PT Goals Patient Stated Goal: to go home PT Goal Formulation: With patient Time For Goal Achievement: 02/21/20 Potential to Achieve Goals: Fair Progress towards PT goals: Progressing toward goals    Frequency    Min 3X/week      PT Plan Current plan remains appropriate    Co-evaluation              AM-PAC PT "6 Clicks" Mobility   Outcome Measure  Help needed turning from your back to your side while in a flat bed without using bedrails?: A Lot Help needed moving from lying on your back to sitting on the side of a flat bed without using bedrails?: A Lot Help needed moving to and from a bed to a chair (including a wheelchair)?: A Lot Help needed standing up from a chair using your arms (e.g., wheelchair or bedside chair)?: A Lot Help needed to walk in hospital room?: A Lot Help needed climbing 3-5 steps with a railing? : A Lot 6 Click Score: 12    End of Session Equipment Utilized During Treatment: Gait belt;Oxygen(3L o2 by [place]) Activity Tolerance: Patient tolerated treatment well Patient left: in chair;with call bell/phone within reach;with nursing/sitter in room Nurse Communication: Mobility status PT Visit Diagnosis: Unsteadiness on feet (R26.81);Muscle weakness (generalized) (M62.81);Difficulty in walking, not elsewhere classified (R26.2)     Time: [id number] PT Time Calculation (min) (ACUTE ONLY): 35 min  Charges:  $Therapeutic Activity: 23-37 mins                     [employer] SPT 02/08/2020    Rolland Porter 02/08/2020, 5:32 PM

## 2020-02-08 NOTE — TOC Progression Note (Signed)
Transition of Care [facility]) - Progression Note    Patient Details  Name: [name] MRN: [id number] Date of Birth: 1924-05-18  Transition of Care [facility]) CM/SW [place], [place] Phone Number: 02/08/2020, 11:03 AM  Clinical Narrative:    CSW spoke with pt wife Kerin Ransom again via telephone. Pt wife interested in medical update, CSW discussed I would ask MD to give the family a call. Pt wife requests this Probation officer and MD call her daughter Presley Raddle [phone]).   CSW called and spoke with pt daughter [name]. Introduced self, role, reason for call. Pt daughter confirms pt was home independently prior to admission. She also is looking for a medical update. CSW shared my role again and recs for SNF. Pt daughter interested in [facility] as it is close to her house and she has in-laws that have been in both assisted and skilled areas. We explained referral process for SNF. Pt has not yet had his COVID vaccines.  CSW has sent referral to [facility] and followed up with Levada Dy in admissions. Pt daughters number provided in secure message along with request to call her to Dr. Wyline Copas. Pt will need new COVID 24-48 hrs prior to d/c.   Expected Discharge Plan: [place] Barriers to Discharge: Continued Medical Work up  Expected Discharge Plan and Services Expected Discharge Plan: [place] In-house Referral: Clinical Social Work Discharge Planning Services: CM Consult, Follow-up appt scheduled Post Acute Care Choice: [place], [place] Living arrangements for the past 2 months: Single Family Home  Readmission Risk Interventions Readmission Risk Prevention Plan 02/07/2020  Transportation Screening Complete  PCP or Specialist Appt within 5-7 Days Complete  Home Care Screening Complete  Medication Review (RN CM) Referral to Pharmacy  Some recent data might be hidden

## 2020-02-08 NOTE — Evaluation (Signed)
Occupational Therapy Evaluation Patient Details Name: [name] MRN: [id number] DOB: [birth year] Today's Date: 02/08/2020    History of Present Illness Pt is a 86 y/o male admitted secondary to partial SBO vs ileus and suspected aspiration PNA with septic shock and AKI. PMH includes s/p AVR, CAD s/p CABG, a fib, CKD, and HTN.     Clinical Impression   PTA patient reports independent with ADLs, mobility. Admitted for above and limited by problem list below, including generalized weakness, decreased activity tolerance and impaired balance.  Noted cognitive deficits at baseline, oriented to self only, pleasantly confused but able to follow simple commands.  Patient currently requires mod assist for bed mobility, mod assist for simulated toilet transfers to recliner using RW, min-total assist for ADLS from EOB/standing.  He will benefit from continued OT services while admitted and after dc at SNF level to optimize return to PLOF and decrease burden of care up on return home.  OT will follow acutely.      Follow Up Recommendations  SNF;Supervision/Assistance - 24 hour    Equipment Recommendations  3 in 1 bedside commode    Recommendations for Other Services       Precautions / Restrictions Precautions Precautions: Fall Restrictions Weight Bearing Restrictions: No      Mobility Bed Mobility Overal bed mobility: Needs Assistance Bed Mobility: Supine to Sit     Supine to sit: Mod assist     General bed mobility comments: able to initate LB and trunk movement, but requires mod assist to complete scooting and trunk elevation to EOB   Transfers Overall transfer level: Needs assistance Equipment used: Rolling walker (2 wheeled) Transfers: Stand Pivot Transfers;Sit to/from Stand Sit to Stand: Mod assist;From elevated surface Stand pivot transfers: Min assist       General transfer comment: mod assist to power up to steady from elevated EOB with cueing for hand placement and  safety; pivoting to recliner with min assist using RW given cueing for technique and safety     Balance Overall balance assessment: Needs assistance Sitting-balance support: No upper extremity supported;Feet supported Sitting balance-Leahy Scale: Fair     Standing balance support: Bilateral upper extremity supported;During functional activity Standing balance-Leahy Scale: Poor Standing balance comment: Reliant on BUE support.                            ADL either performed or assessed with clinical judgement   ADL Overall ADL's : Needs assistance/impaired     Grooming: Set up;Sitting   Upper Body Bathing: Minimal assistance;Sitting   Lower Body Bathing: Maximal assistance;Sit to/from stand   Upper Body Dressing : Minimal assistance;Sitting   Lower Body Dressing: Total assistance;Sit to/from stand Lower Body Dressing Details (indicate cue type and reason): assist for socks, mod assist sit to stand  Toilet Transfer: Moderate assistance;Stand-pivot;RW Toilet Transfer Details (indicate cue type and reason): simulated to recliner  Toileting- Clothing Manipulation and Hygiene: Total assistance;Sit to/from stand       Functional mobility during ADLs: Moderate assistance;Rolling walker;Cueing for safety;Cueing for sequencing General ADL Comments: pt limited by weakness, cognition, balance      Vision   Vision Assessment?: No apparent visual deficits     Perception     Praxis      Pertinent Vitals/Pain Pain Assessment: No/denies pain     Hand Dominance Right   Extremity/Trunk Assessment Upper Extremity Assessment Upper Extremity Assessment: Generalized weakness   Lower Extremity Assessment Lower  Extremity Assessment: Defer to PT evaluation   Cervical / Trunk Assessment Cervical / Trunk Assessment: Kyphotic   Communication Communication Communication: HOH   Cognition Arousal/Alertness: Awake/alert Behavior During Therapy: WFL for tasks  assessed/performed Overall Cognitive Status: History of cognitive impairments - at baseline                                 General Comments: pt with hx of cognitive deficits, oriented to self only.  Following simple commands with increased time. Pleasantly confused.    General Comments  pt on 3L via [place] with VSS    Exercises     Shoulder Instructions      Home Living Family/patient expects to be discharged to:: Private residence Living Arrangements: Spouse/significant other Available Help at Discharge: Family;Available 24 hours/day Type of Home: House Home Access: Stairs to enter [employer] of Steps: 1 Entrance Stairs-Rails: Left Home Layout: One level     Bathroom Shower/Tub: Occupational psychologist: Handicapped height     Home Equipment: Mining engineer - 2 wheels          Prior Functioning/Environment Level of Independence: Independent        Comments: reports independent basic ADLs, not using RW         OT Problem List: Decreased strength;Decreased activity tolerance;Impaired balance (sitting and/or standing);Decreased cognition;Decreased safety awareness;Decreased knowledge of use of DME or AE;Decreased knowledge of precautions      OT Treatment/Interventions: Self-care/ADL training;Therapeutic exercise;DME and/or AE instruction;Therapeutic activities;Patient/family education;Balance training    OT Goals(Current goals can be found in the care plan section) Acute Rehab OT Goals Patient Stated Goal: to go home OT Goal Formulation: With patient Time For Goal Achievement: 02/22/20 Potential to Achieve Goals: Good  OT Frequency: Min 2X/week   Barriers to D/C:            Co-evaluation              AM-PAC OT "6 Clicks" Daily Activity     Outcome Measure Help from another person eating meals?: A Little Help from another person taking care of personal grooming?: A Little Help from another person toileting, which  includes using toliet, bedpan, or urinal?: Total Help from another person bathing (including washing, rinsing, drying)?: A Lot Help from another person to put on and taking off regular upper body clothing?: A Little Help from another person to put on and taking off regular lower body clothing?: Total 6 Click Score: 13   End of Session Equipment Utilized During Treatment: Gait belt;Rolling walker Nurse Communication: Mobility status;Precautions  Activity Tolerance: Patient tolerated treatment well Patient left: in chair;with call bell/phone within reach;with chair alarm set  OT Visit Diagnosis: Other abnormalities of gait and mobility (R26.89);Muscle weakness (generalized) (M62.81)                Time: [id number] OT Time Calculation (min): 23 min Charges:  OT General Charges $OT Visit: 1 Visit OT Evaluation $OT Eval Moderate Complexity: 1 Mod OT Treatments $Self Care/Home Management : 8-22 mins  Jolaine Artist, OT Acute Rehabilitation Services Pager [phone] Office [phone]    Delight Stare 02/08/2020, 10:21 AM

## 2020-02-08 NOTE — Discharge Instructions (Signed)

## 2020-02-08 NOTE — Progress Notes (Signed)
PROGRESS NOTE    [name]  [id number] DOB: 05/21/1924 DOA: 02/04/2020 PCP: Mayra Neer, MD    Brief Narrative:  86 y.o. male with a history of CAD s/p CABG and AS AVR 2013, AFib on eliquis, HTN, and hypothyroidism who presented on 5/1 with SBO versus ileus and suspected aspiration pneumonia with septic shock requiring pressor support, AKI, and AFib with RVR.   In the [id number] to 102.8, tachypneic and tacycardic. BP's low normal. Patient requring 10 L HFNC. Labs remarkable for: K 3.1, Cr 2.24. Lactate 2.3>2.8>3.6>4.3. WBC 10.2, INR 18.3, Mag 2.2, PTT 38. GI consulted for possible SBP and will follow. NGT placed. Cefepime, Vanc, Flagyl for possible PNA. Zofran given. S/p 3L IVF. [id number] improved aeration compared to the plain film of 06/22/2019, there is evidence of acute CHF and bilateral small pleuraleffusions. CT abd/pelvis showed: Multiple dilated jejunal loops with somewhat decompressed colon. No transition point identified. Small amount of free fluid over the right pericolic gutter. Findings may be due to early/partial small bowel obstruction versus ileus. 2. Moderate airspace opacification over the mid to lower right lung and left base likely due to infection with associated basilar atelectasis. Moderate size left effusion with enhancing pleura suggesting empyema.  He was admitted to the ICU, NG tube placed, surgery consulted. Hemodynamics have improved, weaned from pressors, antibiotics narrowed to zosyn. SBO since resolved.  Assessment & Plan:   Principal Problem:   SBO (small bowel obstruction) (HCC) Active Problems:   S/P aortic valve replacement with bioprosthetic valve   S/P CABG (coronary artery bypass graft)   Atrial fibrillation (HCC)   Essential hypertension   Hypothyroidism   Aspiration pneumonia (HCC)   Sepsis (HCC)   AKI (acute kidney injury) ([place])   CKD (chronic kidney disease) stage 2, GFR 60-89 ml/min   Septic shock (HCC)   Community  acquired pneumonia of right lung   Pressure injury of skin  Septic shock and acute hypoxic respiratory failure due to aspiration pneumonia with left pleural effusion, POA: Shock resolved.  - Continue zosyn.  - Continue supplemental oxygen as needed to maintain normal respiratory effort and SpO2 >90%.  - Sputum culture still pending, blood cultures NGTD  Partial SBO vs. ileus: Improving with contrast to rectum on AXR yesterday.  - DC NGT, advance diet as tolerated. Surgery has signed off.   Hypokalemia:  - Supplement again today.  Acute on chronic HFpEF: Following sepsis-protocol IV fluids. Noted to have pulmonary edema on XR -Repeat CXR personally reviewed today. Findings of worsened pulm edema compared to prior study  - Echocardiogram shows normal LV systolic function, LVH, nl RV, nl IVC. -Mucus membranes appeared dry thus did receive some IVF this AM -Given CXR findings, consider resuming lasix  Hypoglycemia:  - Continue monitoring with onset of diet.  CAD s/p CABG: No chest pain.  - Continue anticoagulation, pt not on statin, -BP improved, thus have resumed beta blocker  Chronic atrial fibrillation: Initially with RVR -On eliquis -Hr had been in the 100's over the past several days with improvement in bp -Will resume home beta blocker for better rate control  AKI on stage IIIa CKD: - Improving, not back to baseline (~1.1) -Lasix had been on hold and pt had received brief IVF -Will repeat bmet in AM.  Hypothyroidism: TSH 2.3 - Continue synthroid as tolerated  DVT prophylaxis: Eliquis Code Status: DNR Family Communication: Pt in room, family not at bedside  Status is: Inpatient  Remains inpatient appropriate because:Unsafe d/c plan and IV  treatments appropriate due to intensity of illness or inability to take PO   Dispo: The patient is from: Home              Anticipated d/c is to: SNF              Anticipated d/c date is: 2 days              Patient  currently is not medically stable to d/c.        Consultants:   General Surgery  Critical Care  Procedures:     Antimicrobials: Anti-infectives (From admission, onward)   Start     Dose/Rate Route Frequency Ordered Stop   02/06/20 1600  vancomycin (VANCOCIN) IVPB 1000 mg/200 mL premix  Status:  Discontinued     1,000 mg 200 mL/hr over 60 Minutes Intravenous Every 48 hours 02/04/20 1517 02/05/20 1124   02/05/20 1600  ceFEPIme (MAXIPIME) 2 g in sodium chloride 0.9 % 100 mL IVPB  Status:  Discontinued     2 g 200 mL/hr over 30 Minutes Intravenous Every 24 hours 02/04/20 1517 02/05/20 1124   02/05/20 1500  piperacillin-tazobactam (ZOSYN) IVPB 3.375 g     3.375 g 12.5 mL/hr over 240 Minutes Intravenous Every 8 hours 02/05/20 1124     02/04/20 2100  metroNIDAZOLE (FLAGYL) IVPB 500 mg  Status:  Discontinued     500 mg 100 mL/hr over 60 Minutes Intravenous Every 8 hours 02/04/20 2058 02/05/20 1124   02/04/20 1345  ceFEPIme (MAXIPIME) 2 g in sodium chloride 0.9 % 100 mL IVPB     2 g 200 mL/hr over 30 Minutes Intravenous  Once 02/04/20 1334 02/04/20 1611   02/04/20 1345  metroNIDAZOLE (FLAGYL) IVPB 500 mg     500 mg 100 mL/hr over 60 Minutes Intravenous  Once 02/04/20 1334 02/04/20 1507   02/04/20 1345  vancomycin (VANCOCIN) IVPB 1000 mg/200 mL premix     1,000 mg 200 mL/hr over 60 Minutes Intravenous  Once 02/04/20 1334 02/04/20 1503       Subjective: Seems mildly confused this AM. Denied feeling sob  Objective: Vitals:   02/07/20 2039 02/08/20 0420 02/08/20 0500 02/08/20 0800  BP: 140/72 133/78  (!) 121/59  Pulse: (!) 101 98  98  Resp: 18 18  18   Temp: 97.9 F (36.6 C) 98 F (36.7 C)  98.2 F (36.8 C)  TempSrc: Oral Oral  Oral  SpO2: 99% 98%  96%  Weight:   70.2 kg   Height:        Intake/Output Summary (Last 24 hours) at 02/08/2020 1554 Last data filed at 02/08/2020 1500 Gross per 24 hour  Intake 1592.7 ml  Output 800 ml  Net 792.7 ml   Filed Weights    02/05/20 0500 02/07/20 0429 02/08/20 0500  Weight: 70.2 kg 70.2 kg 70.2 kg    Examination:  General exam: Appears calm and comfortable  Respiratory system: Clear to auscultation. Respiratory effort normal. Cardiovascular system: S1 & S2 heard, Regular Gastrointestinal system: Abdomen is nondistended, soft and nontender. No organomegaly or masses felt. Normal bowel sounds heard. Central nervous system: Alert. No focal neurological deficits. Extremities: Symmetric 5 x 5 power. Skin: No rashes, lesions  Psychiatry: Mood appears normal, affect appears normal  Data Reviewed: I have personally reviewed following labs and imaging studies  CBC: Recent Labs  Lab 02/04/20 1325 02/05/20 0048 02/06/20 0431 02/07/20 0411  WBC 10.2 13.3* 12.8* 12.1*  NEUTROABS 8.8*  --   --   --  HGB 11.3* 10.0* 8.9* 9.5*  HCT 34.5* 30.3* 27.8* 29.9*  MCV 91.0 92.9 91.7 93.4  PLT 255 217 147* 0000000   Basic Metabolic Panel: Recent Labs  Lab 02/04/20 1325 02/05/20 0048 02/06/20 0431 02/07/20 0411 02/08/20 0403  NA 135 138 140 140 137  K 3.1* 3.5 3.1* 3.2* 4.1  CL 92* 97* 99 106 103  CO2 28 28 28 27 25   GLUCOSE 104* 86 77 107* 106*  BUN 59* 62* 66* 53* 44*  CREATININE 2.24* 2.17* 1.89* 1.64* 1.34*  CALCIUM 10.1 9.3 9.3 9.7 9.5  MG 2.2 2.1  --   --   --   PHOS  --  3.5  --   --   --    GFR: Estimated Creatinine Clearance: 33.3 mL/min (A) (by C-G formula based on SCr of 1.34 mg/dL (H)). Liver Function Tests: Recent Labs  Lab 02/04/20 1325  AST 20  ALT 14  ALKPHOS 53  BILITOT 1.7*  PROT 7.9  ALBUMIN 3.4*   No results for input(s): LIPASE, AMYLASE in the last 168 hours. No results for input(s): AMMONIA in the last 168 hours. Coagulation Profile: Recent Labs  Lab 02/04/20 1325  INR 1.6*   Cardiac Enzymes: No results for input(s): CKTOTAL, CKMB, CKMBINDEX, TROPONINI in the last 168 hours. BNP (last 3 results) Recent Labs    07/25/19 1033 08/10/19 1411 01/20/20 1217  PROBNP  3,330* 6,202* 2,330*   HbA1C: No results for input(s): HGBA1C in the last 72 hours. CBG: Recent Labs  Lab 02/07/20 2038 02/08/20 0053 02/08/20 0422 02/08/20 0748 02/08/20 1150  GLUCAP 134* 97 99 94 259*   Lipid Profile: No results for input(s): CHOL, HDL, LDLCALC, TRIG, CHOLHDL, LDLDIRECT in the last 72 hours. Thyroid Function Tests: No results for input(s): TSH, T4TOTAL, FREET4, T3FREE, THYROIDAB in the last 72 hours. Anemia Panel: No results for input(s): VITAMINB12, FOLATE, FERRITIN, TIBC, IRON, RETICCTPCT in the last 72 hours. Sepsis Labs: Recent Labs  Lab 02/04/20 1819 02/04/20 2000 02/05/20 0048 02/05/20 0450  LATICACIDVEN 3.6* 4.3* 3.1* 2.4*    Recent Results (from the past 240 hour(s))  Culture, blood (Routine x 2)     Status: None (Preliminary result)   Collection Time: 02/04/20  1:25 PM   Specimen: BLOOD RIGHT ARM  Result Value Ref Range Status   Specimen Description BLOOD RIGHT ARM  Final   Special Requests   Final    BOTTLES DRAWN AEROBIC AND ANAEROBIC Blood Culture adequate volume   Culture   Final    NO GROWTH 4 DAYS Performed at [facility], [place] [address], [place], [place] [address]    Report Status PENDING  Incomplete  Urine culture     Status: None   Collection Time: 02/04/20  1:32 PM   Specimen: In/Out Cath Urine  Result Value Ref Range Status   Specimen Description IN/OUT CATH URINE  Final   Special Requests NONE  Final   Culture   Final    NO GROWTH Performed at [facility], [place] [address], [place], [place] [address]    Report Status 02/06/2020 FINAL  Final  Culture, blood (Routine x 2)     Status: None (Preliminary result)   Collection Time: 02/04/20  2:00 PM   Specimen: BLOOD  Result Value Ref Range Status   Specimen Description BLOOD RIGHT ANTECUBITAL  Final   Special Requests   Final    BOTTLES DRAWN AEROBIC AND ANAEROBIC Blood Culture results may not be optimal due  to an excessive volume of blood received in culture  bottles   Culture   Final    NO GROWTH 4 DAYS Performed at [facility], [place] [address], [place], [place] [address]    Report Status PENDING  Incomplete  Respiratory Panel by RT PCR (Flu A&B, Covid) - Nasopharyngeal Swab     Status: None   Collection Time: 02/04/20  3:33 PM   Specimen: Nasopharyngeal Swab  Result Value Ref Range Status   SARS Coronavirus 2 by RT PCR NEGATIVE NEGATIVE Final    Comment: (NOTE) SARS-CoV-2 target nucleic acids are NOT DETECTED. The SARS-CoV-2 RNA is generally detectable in upper respiratoy specimens during the acute phase of infection. The lowest concentration of SARS-CoV-2 viral copies this assay can detect is 131 copies/mL. A negative result does not preclude SARS-Cov-2 infection and should not be used as the sole basis for treatment or other patient management decisions. A negative result may occur with  improper specimen collection/handling, submission of specimen other than nasopharyngeal swab, presence of viral mutation(s) within the areas targeted by this assay, and inadequate number of viral copies (<131 copies/mL). A negative result must be combined with clinical observations, patient history, and epidemiological information. The expected result is Negative. Fact Sheet for Patients:  PinkCheek.be Fact Sheet for Healthcare Providers:  GravelBags.it This test is not yet ap proved or cleared by the [place] FDA and  has been authorized for detection and/or diagnosis of SARS-CoV-2 by FDA under an Emergency Use Authorization (EUA). This EUA will remain  in effect (meaning this test can be used) for the duration of the COVID-19 declaration under Section 564(b)(1) of the Act, 21 U.S.C. section 360bbb-3(b)(1), unless the authorization is terminated or revoked sooner.    Influenza A by PCR NEGATIVE NEGATIVE Final   Influenza B by PCR NEGATIVE NEGATIVE Final    Comment: (NOTE) The  Xpert Xpress SARS-CoV-2/FLU/RSV assay is intended as an aid in  the diagnosis of influenza from Nasopharyngeal swab specimens and  should not be used as a sole basis for treatment. Nasal washings and  aspirates are unacceptable for Xpert Xpress SARS-CoV-2/FLU/RSV  testing. Fact Sheet for Patients: PinkCheek.be Fact Sheet for Healthcare Providers: GravelBags.it This test is not yet approved or cleared by the [place] FDA and  has been authorized for detection and/or diagnosis of SARS-CoV-2 by  FDA under an Emergency Use Authorization (EUA). This EUA will remain  in effect (meaning this test can be used) for the duration of the  Covid-19 declaration under Section 564(b)(1) of the Act, 21  U.S.C. section 360bbb-3(b)(1), unless the authorization is  terminated or revoked. Performed at [facility], [place] [address], [place], [place] [address]   MRSA PCR Screening     Status: None   Collection Time: 02/04/20 11:47 PM   Specimen: Nasal Mucosa; Nasopharyngeal  Result Value Ref Range Status   MRSA by PCR NEGATIVE NEGATIVE Final    Comment:        The GeneXpert MRSA Assay (FDA approved for NASAL specimens only), is one component of a comprehensive MRSA colonization surveillance program. It is not intended to diagnose MRSA infection nor to guide or monitor treatment for MRSA infections. Performed at [facility], [place] [address], [place], [place] [address]      Radiology Studies: DG CHEST PORT 1 VIEW  Result Date: 02/08/2020 CLINICAL DATA:  Pleural effusion.  CHF. EXAM: PORTABLE CHEST 1 VIEW COMPARISON:  02/04/2020 FINDINGS: Significant progression of diffuse  bilateral airspace disease consistent with edema. Small pleural effusions. Mild bibasilar atelectasis CABG and aortic valve replacement.  Atherosclerotic aortic arch. IMPRESSION: Significant progression in diffuse bilateral airspace disease consistent with  pulmonary edema. Small pleural effusions. Electronically Signed   By: Franchot Gallo M.D.   On: 02/08/2020 08:46   DG Abd Portable 1V-Small Bowel Obstruction Protocol-initial, 8 hr delay  Result Date: 02/06/2020 CLINICAL DATA:  Small-bowel obstruction EXAM: PORTABLE ABDOMEN - 1 VIEW COMPARISON:  Feb 05, 2020 FINDINGS: The enteric tube projects over the left upper quadrant. Oral contrast is noted in the small bowel and colon to the level of the rectum. Mildly dilated loops of small bowel are again noted in the left abdomen. Advanced aortic calcifications are noted. IMPRESSION: 1. Oral contrast is noted in the small bowel and colon to the level of the rectum. 2. Enteric tube projects over the left upper quadrant. 3. Persistent mildly dilated loops of small bowel are noted in the left mid abdomen. Electronically Signed   By: Constance Holster M.D.   On: 02/06/2020 18:50    Scheduled Meds: . apixaban  2.5 mg Oral BID  . Chlorhexidine Gluconate Cloth  6 each Topical Q0600  . levothyroxine  100 mcg Oral Q0600  . metoprolol succinate  25 mg Oral Daily  . pantoprazole  40 mg Oral Daily   Continuous Infusions: . sodium chloride Stopped (02/06/20 0117)  . lactated ringers 75 mL/hr at 02/08/20 1159  . piperacillin-tazobactam (ZOSYN)  IV 3.375 g (02/08/20 1455)     LOS: 4 days   Marylu Lund, MD [facility] Pager On Amion  If 7PM-7AM, please contact night-coverage 02/08/2020, 3:54 PM

## 2020-02-09 LAB — CULTURE, BLOOD (ROUTINE X 2)
Culture: NO GROWTH
Culture: NO GROWTH
Special Requests: ADEQUATE

## 2020-02-09 LAB — COMPREHENSIVE METABOLIC PANEL
ALT: 15 U/L (ref 0–44)
AST: 18 U/L (ref 15–41)
Albumin: 2.1 g/dL — ABNORMAL LOW (ref 3.5–5.0)
Alkaline Phosphatase: 54 U/L (ref 38–126)
Anion gap: 9 (ref 5–15)
BUN: 38 mg/dL — ABNORMAL HIGH (ref 8–23)
CO2: 26 mmol/L (ref 22–32)
Calcium: 9.4 mg/dL (ref 8.9–10.3)
Chloride: 98 mmol/L (ref 98–111)
Creatinine, Ser: 1.32 mg/dL — ABNORMAL HIGH (ref 0.61–1.24)
GFR calc Af Amer: 54 mL/min — ABNORMAL LOW (ref >60)
GFR calc non Af Amer: 46 mL/min — ABNORMAL LOW (ref >60)
Glucose, Bld: 160 mg/dL — ABNORMAL HIGH (ref 70–99)
Potassium: 3.9 mmol/L (ref 3.5–5.1)
Sodium: 133 mmol/L — ABNORMAL LOW (ref 135–145)
Total Bilirubin: 0.7 mg/dL (ref 0.3–1.2)
Total Protein: 6.1 g/dL — ABNORMAL LOW (ref 6.5–8.1)

## 2020-02-09 LAB — CBC
HCT: 28.2 % — ABNORMAL LOW (ref 39.0–52.0)
Hemoglobin: 8.9 g/dL — ABNORMAL LOW (ref 13.0–17.0)
MCH: 29.1 pg (ref 26.0–34.0)
MCHC: 31.6 g/dL (ref 30.0–36.0)
MCV: 92.2 fL (ref 80.0–100.0)
Platelets: 183 10*3/uL (ref 150–400)
RBC: 3.06 MIL/uL — ABNORMAL LOW (ref 4.22–5.81)
RDW: 14.4 % (ref 11.5–15.5)
WBC: 13.8 10*3/uL — ABNORMAL HIGH (ref 4.0–10.5)
nRBC: 0 % (ref 0.0–0.2)

## 2020-02-09 LAB — GLUCOSE, CAPILLARY
Glucose-Capillary: 138 mg/dL — ABNORMAL HIGH (ref 70–99)
Glucose-Capillary: 151 mg/dL — ABNORMAL HIGH (ref 70–99)
Glucose-Capillary: 124 mg/dL — ABNORMAL HIGH (ref 70–99)
Glucose-Capillary: 112 mg/dL — ABNORMAL HIGH (ref 70–99)

## 2020-02-09 MED ORDER — FUROSEMIDE 10 MG/ML IJ SOLN
40.0000 mg | Freq: Once | INTRAMUSCULAR | Status: AC
  Administered 2020-02-09: 40 mg via INTRAVENOUS
  Filled 2020-02-09: qty 4

## 2020-02-09 NOTE — TOC Progression Note (Addendum)
Transition of Care [facility]) - Progression Note    Patient Details  Name: DELANE CREHAN MRN: [id number] Date of Birth: 12-27-1923  Transition of Care [facility]) CM/SW [place], [place] Phone Number: 02/09/2020, 11:57 AM  Clinical Narrative:    12:07pm- CSW spoke with pt daughter [name], confirmed acceptance of bed at [facility], continue to follow for medical stability. Pt daughter aware CSW remains available as needed and has my contact information.   11:57am- CSW has spoken with [facility] and they are able to offer placement. HIPAA compliant message left on machine for pt daughter [name] [phone]). TOC team following for medical stability. COVID swab negative and valid through 5/7, will need new COVID swab if not stable for d/c tomorrow.    Expected Discharge Plan: [place] Barriers to Discharge: Continued Medical Work up  Expected Discharge Plan and Services Expected Discharge Plan: [place] In-house Referral: Clinical Social Work Discharge Planning Services: CM Consult, Follow-up appt scheduled Post Acute Care Choice: [place], [place] Living arrangements for the past 2 months: Single Family Home  Readmission Risk Interventions Readmission Risk Prevention Plan 02/07/2020  Transportation Screening Complete  PCP or Specialist Appt within 5-7 Days Complete  Home Care Screening Complete  Medication Review (RN CM) Referral to Pharmacy  Some recent data might be hidden

## 2020-02-09 NOTE — Progress Notes (Signed)
PROGRESS NOTE    [name]  [id number] DOB: 10/28/1923 DOA: 02/04/2020 PCP: Mayra Neer, MD    Brief Narrative:  86 y.o. male with a history of CAD s/p CABG and AS AVR 2013, AFib on eliquis, HTN, and hypothyroidism who presented on 5/1 with SBO versus ileus and suspected aspiration pneumonia with septic shock requiring pressor support, AKI, and AFib with RVR.   In the [id number] to 102.8, tachypneic and tacycardic. BP's low normal. Patient requring 10 L HFNC. Labs remarkable for: K 3.1, Cr 2.24. Lactate 2.3>2.8>3.6>4.3. WBC 10.2, INR 18.3, Mag 2.2, PTT 38. GI consulted for possible SBP and will follow. NGT placed. Cefepime, Vanc, Flagyl for possible PNA. Zofran given. S/p 3L IVF. [id number] improved aeration compared to the plain film of 06/22/2019, there is evidence of acute CHF and bilateral small pleuraleffusions. CT abd/pelvis showed: Multiple dilated jejunal loops with somewhat decompressed colon. No transition point identified. Small amount of free fluid over the right pericolic gutter. Findings may be due to early/partial small bowel obstruction versus ileus. 2. Moderate airspace opacification over the mid to lower right lung and left base likely due to infection with associated basilar atelectasis. Moderate size left effusion with enhancing pleura suggesting empyema.  He was admitted to the ICU, NG tube placed, surgery consulted. Hemodynamics have improved, weaned from pressors, antibiotics narrowed to zosyn. SBO since resolved.  Assessment & Plan:   Principal Problem:   SBO (small bowel obstruction) (HCC) Active Problems:   S/P aortic valve replacement with bioprosthetic valve   S/P CABG (coronary artery bypass graft)   Atrial fibrillation (HCC)   Essential hypertension   Hypothyroidism   Aspiration pneumonia (HCC)   Sepsis (HCC)   AKI (acute kidney injury) ([place])   CKD (chronic kidney disease) stage 2, GFR 60-89 ml/min   Septic shock (HCC)   Community  acquired pneumonia of right lung   Pressure injury of skin  Septic shock and acute hypoxic respiratory failure due to aspiration pneumonia with left pleural effusion, POA: Shock resolved.  - Continued on zosyn.  - Continue supplemental oxygen as needed to maintain normal respiratory effort and SpO2 >90%.  - Sputum culture still pending, blood cultures NGTD - Wean O2 as tolerated, still on 3L this AM  Partial SBO vs. ileus: Improving with contrast to rectum on AXR recently - DC'd NGT, advanced diet as tolerated. Surgery has signed off.   Hypokalemia:  - Continue to replace as tolerated  Acute on chronic HFpEF: Following sepsis-protocol IV fluids. Noted to have pulmonary edema on XR -Recent CXR with worse findings suggestive of worsening edema - Echocardiogram shows normal LV systolic function, LVH, nl RV, nl IVC. -Mucus membranes appeared dry thus did receive some IVF this AM -Given recent CXR findings, have been giving IV lasix. Overnight, respiratory effort seems improved, although pt still requiring 3LNC  Hypoglycemia:  - Continue monitoring with onset of diet.  CAD s/p CABG: No chest pain.  - Continue anticoagulation, pt not on statin, -BP improved, tolerating beta blocker  Chronic atrial fibrillation: Initially with RVR -On eliquis -Hr had recently been in the 100's over the past several days with improvement in bp -tolerating home metoprolol for rate control  AKI on stage IIIa CKD: - Improving, not back to baseline (~1.1) -Lasix had initially been on hold and pt had received brief IVF -Renal function improving, tolerated dose of IV lasix 5/5 -Given concerns of continued volume overload, will give another dose of IV lasix -repeat bmet in AM  Hypothyroidism: TSH 2.3 - Continue synthroid as pt tolerates  DVT prophylaxis: Eliquis Code Status: DNR Family Communication: Pt in room, family not at bedside  Status is: Inpatient  Remains inpatient appropriate  because:Unsafe d/c plan and IV treatments appropriate due to intensity of illness or inability to take PO   Dispo: The patient is from: Home              Anticipated d/c is to: SNF              Anticipated d/c date is: 2 days              Patient currently is not medically stable to d/c.        Consultants:   General Surgery  Critical Care  Procedures:     Antimicrobials: Anti-infectives (From admission, onward)   Start     Dose/Rate Route Frequency Ordered Stop   02/06/20 1600  vancomycin (VANCOCIN) IVPB 1000 mg/200 mL premix  Status:  Discontinued     1,000 mg 200 mL/hr over 60 Minutes Intravenous Every 48 hours 02/04/20 1517 02/05/20 1124   02/05/20 1600  ceFEPIme (MAXIPIME) 2 g in sodium chloride 0.9 % 100 mL IVPB  Status:  Discontinued     2 g 200 mL/hr over 30 Minutes Intravenous Every 24 hours 02/04/20 1517 02/05/20 1124   02/05/20 1500  piperacillin-tazobactam (ZOSYN) IVPB 3.375 g     3.375 g 12.5 mL/hr over 240 Minutes Intravenous Every 8 hours 02/05/20 1124     02/04/20 2100  metroNIDAZOLE (FLAGYL) IVPB 500 mg  Status:  Discontinued     500 mg 100 mL/hr over 60 Minutes Intravenous Every 8 hours 02/04/20 2058 02/05/20 1124   02/04/20 1345  ceFEPIme (MAXIPIME) 2 g in sodium chloride 0.9 % 100 mL IVPB     2 g 200 mL/hr over 30 Minutes Intravenous  Once 02/04/20 1334 02/04/20 1611   02/04/20 1345  metroNIDAZOLE (FLAGYL) IVPB 500 mg     500 mg 100 mL/hr over 60 Minutes Intravenous  Once 02/04/20 1334 02/04/20 1507   02/04/20 1345  vancomycin (VANCOCIN) IVPB 1000 mg/200 mL premix     1,000 mg 200 mL/hr over 60 Minutes Intravenous  Once 02/04/20 1334 02/04/20 1503      Subjective: Pleasant this AM, without complaints. Denies feeling sob  Objective: Vitals:   02/08/20 1701 02/08/20 2101 02/09/20 0500 02/09/20 0537  BP: 119/80 136/85  116/62  Pulse: 67 83  81  Resp: 16 15  14   Temp: 97.7 F (36.5 C) (!) 97.5 F (36.4 C)  97.6 F (36.4 C)  TempSrc: Oral  Oral  Oral  SpO2: 97% 97%  100%  Weight:   70 kg   Height:        Intake/Output Summary (Last 24 hours) at 02/09/2020 1539 Last data filed at 02/09/2020 1300 Gross per 24 hour  Intake 1061 ml  Output 530 ml  Net 531 ml   Filed Weights   02/07/20 0429 02/08/20 0500 02/09/20 0500  Weight: 70.2 kg 70.2 kg 70 kg    Examination: General exam: Awake, laying in bed, in nad Respiratory system: Normal respiratory effort, no audible wheezing Cardiovascular system: regular rate, s1, s2 Gastrointestinal system: Soft, nondistended, positive BS Central nervous system: CN2-12 grossly intact, strength intact Extremities: Perfused, no clubbing Skin: Normal skin turgor, no notable skin lesions seen Psychiatry: Mood normal // no visual hallucinations   Data Reviewed: I have personally reviewed following labs and imaging studies  CBC:  Recent Labs  Lab 02/04/20 1325 02/05/20 0048 02/06/20 0431 02/07/20 0411 02/09/20 0230  WBC 10.2 13.3* 12.8* 12.1* 13.8*  NEUTROABS 8.8*  --   --   --   --   HGB 11.3* 10.0* 8.9* 9.5* 8.9*  HCT 34.5* 30.3* 27.8* 29.9* 28.2*  MCV 91.0 92.9 91.7 93.4 92.2  PLT 255 217 147* 176 [id number]   Basic Metabolic Panel: Recent Labs  Lab 02/04/20 1325 02/04/20 1325 02/05/20 0048 02/06/20 0431 02/07/20 0411 02/08/20 0403 02/09/20 0230  NA 135   < > 138 140 140 137 133*  K 3.1*   < > 3.5 3.1* 3.2* 4.1 3.9  CL 92*   < > 97* 99 106 103 98  CO2 28   < > 28 28 27 25 26   GLUCOSE 104*   < > 86 77 107* 106* 160*  BUN 59*   < > 62* 66* 53* 44* 38*  CREATININE 2.24*   < > 2.17* 1.89* 1.64* 1.34* 1.32*  CALCIUM 10.1   < > 9.3 9.3 9.7 9.5 9.4  MG 2.2  --  2.1  --   --   --   --   PHOS  --   --  3.5  --   --   --   --    < > = values in this interval not displayed.   GFR: Estimated Creatinine Clearance: 33.8 mL/min (A) (by C-G formula based on SCr of 1.32 mg/dL (H)). Liver Function Tests: Recent Labs  Lab 02/04/20 1325 02/09/20 0230  AST 20 18  ALT 14 15  ALKPHOS 53  54  BILITOT 1.7* 0.7  PROT 7.9 6.1*  ALBUMIN 3.4* 2.1*   No results for input(s): LIPASE, AMYLASE in the last 168 hours. No results for input(s): AMMONIA in the last 168 hours. Coagulation Profile: Recent Labs  Lab 02/04/20 1325  INR 1.6*   Cardiac Enzymes: No results for input(s): CKTOTAL, CKMB, CKMBINDEX, TROPONINI in the last 168 hours. BNP (last 3 results) Recent Labs    07/25/19 1033 08/10/19 1411 01/20/20 1217  PROBNP 3,330* 6,202* 2,330*   HbA1C: No results for input(s): HGBA1C in the last 72 hours. CBG: Recent Labs  Lab 02/08/20 0748 02/08/20 1150 02/08/20 1659 02/09/20 0904 02/09/20 1253  GLUCAP 94 259* 137* 138* 151*   Lipid Profile: No results for input(s): CHOL, HDL, LDLCALC, TRIG, CHOLHDL, LDLDIRECT in the last 72 hours. Thyroid Function Tests: No results for input(s): TSH, T4TOTAL, FREET4, T3FREE, THYROIDAB in the last 72 hours. Anemia Panel: No results for input(s): VITAMINB12, FOLATE, FERRITIN, TIBC, IRON, RETICCTPCT in the last 72 hours. Sepsis Labs: Recent Labs  Lab 02/04/20 1819 02/04/20 2000 02/05/20 0048 02/05/20 0450  LATICACIDVEN 3.6* 4.3* 3.1* 2.4*    Recent Results (from the past 240 hour(s))  Culture, blood (Routine x 2)     Status: None   Collection Time: 02/04/20  1:25 PM   Specimen: BLOOD RIGHT ARM  Result Value Ref Range Status   Specimen Description BLOOD RIGHT ARM  Final   Special Requests   Final    BOTTLES DRAWN AEROBIC AND ANAEROBIC Blood Culture adequate volume   Culture   Final    NO GROWTH 5 DAYS Performed at [facility], [address], [place], [place] [address]    Report Status 02/09/2020 FINAL  Final  Urine culture     Status: None   Collection Time: 02/04/20  1:32 PM   Specimen: In/Out Cath Urine  Result Value Ref  Range Status   Specimen Description IN/OUT CATH URINE  Final   Special Requests NONE  Final   Culture   Final    NO GROWTH Performed at [facility], [address],  [place], [place] [address]    Report Status 02/06/2020 FINAL  Final  Culture, blood (Routine x 2)     Status: None   Collection Time: 02/04/20  2:00 PM   Specimen: BLOOD  Result Value Ref Range Status   Specimen Description BLOOD RIGHT ANTECUBITAL  Final   Special Requests   Final    BOTTLES DRAWN AEROBIC AND ANAEROBIC Blood Culture results may not be optimal due to an excessive volume of blood received in culture bottles   Culture   Final    NO GROWTH 5 DAYS Performed at [facility], [place] [address] [place], [place] [address]    Report Status 02/09/2020 FINAL  Final  Respiratory Panel by RT PCR (Flu A&B, Covid) - Nasopharyngeal Swab     Status: None   Collection Time: 02/04/20  3:33 PM   Specimen: Nasopharyngeal Swab  Result Value Ref Range Status   SARS Coronavirus 2 by RT PCR NEGATIVE NEGATIVE Final    Comment: (NOTE) SARS-CoV-2 target nucleic acids are NOT DETECTED. The SARS-CoV-2 RNA is generally detectable in upper respiratoy specimens during the acute phase of infection. The lowest concentration of SARS-CoV-2 viral copies this assay can detect is 131 copies/mL. A negative result does not preclude SARS-Cov-2 infection and should not be used as the sole basis for treatment or other patient management decisions. A negative result may occur with  improper specimen collection/handling, submission of specimen other than nasopharyngeal swab, presence of viral mutation(s) within the areas targeted by this assay, and inadequate number of viral copies (<131 copies/mL). A negative result must be combined with clinical observations, patient history, and epidemiological information. The expected result is Negative. Fact Sheet for Patients:  PinkCheek.be Fact Sheet for Healthcare Providers:  GravelBags.it This test is not yet ap proved or cleared by the [place] FDA and  has been authorized for detection and/or diagnosis  of SARS-CoV-2 by FDA under an Emergency Use Authorization (EUA). This EUA will remain  in effect (meaning this test can be used) for the duration of the COVID-19 declaration under Section 564(b)(1) of the Act, 21 U.S.C. section 360bbb-3(b)(1), unless the authorization is terminated or revoked sooner.    Influenza A by PCR NEGATIVE NEGATIVE Final   Influenza B by PCR NEGATIVE NEGATIVE Final    Comment: (NOTE) The Xpert Xpress SARS-CoV-2/FLU/RSV assay is intended as an aid in  the diagnosis of influenza from Nasopharyngeal swab specimens and  should not be used as a sole basis for treatment. Nasal washings and  aspirates are unacceptable for Xpert Xpress SARS-CoV-2/FLU/RSV  testing. Fact Sheet for Patients: PinkCheek.be Fact Sheet for Healthcare Providers: GravelBags.it This test is not yet approved or cleared by the [place] FDA and  has been authorized for detection and/or diagnosis of SARS-CoV-2 by  FDA under an Emergency Use Authorization (EUA). This EUA will remain  in effect (meaning this test can be used) for the duration of the  Covid-19 declaration under Section 564(b)(1) of the Act, 21  U.S.C. section 360bbb-3(b)(1), unless the authorization is  terminated or revoked. Performed at [facility], [place] [address] [place], [place] [address]   MRSA PCR Screening     Status: None   Collection Time: 02/04/20 11:47 PM   Specimen:  Nasal Mucosa; Nasopharyngeal  Result Value Ref Range Status   MRSA by PCR NEGATIVE NEGATIVE Final    Comment:        The GeneXpert MRSA Assay (FDA approved for NASAL specimens only), is one component of a comprehensive MRSA colonization surveillance program. It is not intended to diagnose MRSA infection nor to guide or monitor treatment for MRSA infections. Performed at [facility], [place] [address] [place], [place] [address]   SARS CORONAVIRUS 2 (TAT 6-24 HRS)  Nasopharyngeal Nasopharyngeal Swab     Status: None   Collection Time: 02/08/20  2:09 PM   Specimen: Nasopharyngeal Swab  Result Value Ref Range Status   SARS Coronavirus 2 NEGATIVE NEGATIVE Final    Comment: (NOTE) SARS-CoV-2 target nucleic acids are NOT DETECTED. The SARS-CoV-2 RNA is generally detectable in upper and lower respiratory specimens during the acute phase of infection. Negative results do not preclude SARS-CoV-2 infection, do not rule out co-infections with other pathogens, and should not be used as the sole basis for treatment or other patient management decisions. Negative results must be combined with clinical observations, patient history, and epidemiological information. The expected result is Negative. Fact Sheet for Patients: SugarRoll.be Fact Sheet for Healthcare Providers: https://www.woods-mathews.com/ This test is not yet approved or cleared by the [place] FDA and  has been authorized for detection and/or diagnosis of SARS-CoV-2 by FDA under an Emergency Use Authorization (EUA). This EUA will remain  in effect (meaning this test can be used) for the duration of the COVID-19 declaration under Section 56 4(b)(1) of the Act, 21 U.S.C. section 360bbb-3(b)(1), unless the authorization is terminated or revoked sooner. Performed at [facility], [place] [address], [place], [place] [address]      Radiology Studies: DG CHEST PORT 1 VIEW  Result Date: 02/08/2020 CLINICAL DATA:  Pleural effusion.  CHF. EXAM: PORTABLE CHEST 1 VIEW COMPARISON:  02/04/2020 FINDINGS: Significant progression of diffuse bilateral airspace disease consistent with edema. Small pleural effusions. Mild bibasilar atelectasis CABG and aortic valve replacement.  Atherosclerotic aortic arch. IMPRESSION: Significant progression in diffuse bilateral airspace disease consistent with pulmonary edema. Small pleural effusions. Electronically Signed   By:  Franchot Gallo M.D.   On: 02/08/2020 08:46    Scheduled Meds: . apixaban  2.5 mg Oral BID  . Chlorhexidine Gluconate Cloth  6 each Topical Q0600  . furosemide  40 mg Intravenous Once  . levothyroxine  100 mcg Oral Q0600  . metoprolol succinate  25 mg Oral Daily  . pantoprazole  40 mg Oral Daily   Continuous Infusions: . sodium chloride Stopped (02/06/20 0117)  . piperacillin-tazobactam (ZOSYN)  IV 3.375 g (02/09/20 1422)     LOS: 5 days   Marylu Lund, MD [facility] Pager On Amion  If 7PM-7AM, please contact night-coverage 02/09/2020, 3:39 PM

## 2020-02-10 ENCOUNTER — Inpatient Hospital Stay (HOSPITAL_COMMUNITY): Payer: Medicare Other

## 2020-02-10 LAB — GLUCOSE, CAPILLARY
Glucose-Capillary: 102 mg/dL — ABNORMAL HIGH (ref 70–99)
Glucose-Capillary: 115 mg/dL — ABNORMAL HIGH (ref 70–99)
Glucose-Capillary: 126 mg/dL — ABNORMAL HIGH (ref 70–99)
Glucose-Capillary: 157 mg/dL — ABNORMAL HIGH (ref 70–99)
Glucose-Capillary: 174 mg/dL — ABNORMAL HIGH (ref 70–99)
Glucose-Capillary: 184 mg/dL — ABNORMAL HIGH (ref 70–99)

## 2020-02-10 LAB — COMPREHENSIVE METABOLIC PANEL
ALT: 13 U/L (ref 0–44)
AST: 15 U/L (ref 15–41)
Albumin: 1.9 g/dL — ABNORMAL LOW (ref 3.5–5.0)
Alkaline Phosphatase: 51 U/L (ref 38–126)
Anion gap: 8 (ref 5–15)
BUN: 31 mg/dL — ABNORMAL HIGH (ref 8–23)
CO2: 26 mmol/L (ref 22–32)
Calcium: 9 mg/dL (ref 8.9–10.3)
Chloride: 97 mmol/L — ABNORMAL LOW (ref 98–111)
Creatinine, Ser: 1.4 mg/dL — ABNORMAL HIGH (ref 0.61–1.24)
GFR calc Af Amer: 50 mL/min — ABNORMAL LOW (ref >60)
GFR calc non Af Amer: 43 mL/min — ABNORMAL LOW (ref >60)
Glucose, Bld: 118 mg/dL — ABNORMAL HIGH (ref 70–99)
Potassium: 3.7 mmol/L (ref 3.5–5.1)
Sodium: 131 mmol/L — ABNORMAL LOW (ref 135–145)
Total Bilirubin: 1 mg/dL (ref 0.3–1.2)
Total Protein: 6 g/dL — ABNORMAL LOW (ref 6.5–8.1)

## 2020-02-10 LAB — CBC
HCT: 28.5 % — ABNORMAL LOW (ref 39.0–52.0)
Hemoglobin: 9.3 g/dL — ABNORMAL LOW (ref 13.0–17.0)
MCH: 29.2 pg (ref 26.0–34.0)
MCHC: 32.6 g/dL (ref 30.0–36.0)
MCV: 89.6 fL (ref 80.0–100.0)
Platelets: 190 10*3/uL (ref 150–400)
RBC: 3.18 MIL/uL — ABNORMAL LOW (ref 4.22–5.81)
RDW: 14.2 % (ref 11.5–15.5)
WBC: 16.7 10*3/uL — ABNORMAL HIGH (ref 4.0–10.5)
nRBC: 0 % (ref 0.0–0.2)

## 2020-02-10 LAB — URINALYSIS, ROUTINE W REFLEX MICROSCOPIC
Bilirubin Urine: NEGATIVE
Glucose, UA: NEGATIVE mg/dL
Hgb urine dipstick: NEGATIVE
Ketones, ur: NEGATIVE mg/dL
Leukocytes,Ua: NEGATIVE
Nitrite: NEGATIVE
Protein, ur: NEGATIVE mg/dL
Specific Gravity, Urine: 1.008 (ref 1.005–1.030)
pH: 5 (ref 5.0–8.0)

## 2020-02-10 LAB — BRAIN NATRIURETIC PEPTIDE: B Natriuretic Peptide: 316.5 pg/mL — ABNORMAL HIGH (ref 0.0–100.0)

## 2020-02-10 LAB — PROCALCITONIN: Procalcitonin: 2.12 ng/mL

## 2020-02-10 MED ORDER — ADULT MULTIVITAMIN W/MINERALS CH
1.0000 | ORAL_TABLET | Freq: Every day | ORAL | Status: DC
  Administered 2020-02-10 – 2020-02-15 (×6): 1 via ORAL
  Filled 2020-02-10 (×6): qty 1

## 2020-02-10 MED ORDER — FUROSEMIDE 10 MG/ML IJ SOLN
60.0000 mg | Freq: Two times a day (BID) | INTRAMUSCULAR | Status: DC
  Administered 2020-02-10 (×2): 60 mg via INTRAVENOUS
  Filled 2020-02-10 (×2): qty 6

## 2020-02-10 MED ORDER — ENSURE ENLIVE PO LIQD
237.0000 mL | Freq: Two times a day (BID) | ORAL | Status: DC
  Administered 2020-02-10 – 2020-02-15 (×8): 237 mL via ORAL

## 2020-02-10 NOTE — TOC Progression Note (Signed)
Transition of Care [facility]) - Progression Note    Patient Details  Name: [name] MRN: [id number] Date of Birth: October 26, 1923  Transition of [facility] CM/SW [place], [place] Phone Number: 02/10/2020, 11:05 AM  Clinical Narrative:    TOC team continues to follow, pt still not medically stable at this time. Spoke with MD, when pt closer to d/c (24hrs) then we will need a new COVID swab. CSW has alerted [place] that pt is not ready, they continue to follow as well.    Expected Discharge Plan: [place] Barriers to Discharge: Continued Medical Work up  Expected Discharge Plan and Services Expected Discharge Plan: [place] In-house Referral: Clinical Social Work Discharge Planning Services: CM Consult, Follow-up appt scheduled Post Acute Care Choice: [place], [place] Living arrangements for the past 2 months: Single Family Home    Readmission Risk Interventions Readmission Risk Prevention Plan 02/07/2020  Transportation Screening Complete  PCP or Specialist Appt within 5-7 Days Complete  Home Care Screening Complete  Medication Review (RN CM) Referral to Pharmacy  Some recent data might be hidden

## 2020-02-10 NOTE — Progress Notes (Signed)
PROGRESS NOTE    [name]  [id number] DOB: 05/05/1924 DOA: 02/04/2020 PCP: Mayra Neer, MD    Brief Narrative:  86 y.o. male with a history of CAD s/p CABG and AS AVR 2013, AFib on eliquis, HTN, and hypothyroidism who presented on 5/1 with SBO versus ileus and suspected aspiration pneumonia with septic shock requiring pressor support, AKI, and AFib with RVR.   In the [id number] to 102.8, tachypneic and tacycardic. BP's low normal. Patient requring 10 L HFNC. Labs remarkable for: K 3.1, Cr 2.24. Lactate 2.3>2.8>3.6>4.3. WBC 10.2, INR 18.3, Mag 2.2, PTT 38. GI consulted for possible SBP and will follow. NGT placed. Cefepime, Vanc, Flagyl for possible PNA. Zofran given. S/p 3L IVF. [id number] improved aeration compared to the plain film of 06/22/2019, there is evidence of acute CHF and bilateral small pleuraleffusions. CT abd/pelvis showed: Multiple dilated jejunal loops with somewhat decompressed colon. No transition point identified. Small amount of free fluid over the right pericolic gutter. Findings may be due to early/partial small bowel obstruction versus ileus. 2. Moderate airspace opacification over the mid to lower right lung and left base likely due to infection with associated basilar atelectasis. Moderate size left effusion with enhancing pleura suggesting empyema.  He was admitted to the ICU, NG tube placed, surgery consulted. Hemodynamics have improved, weaned from pressors, antibiotics narrowed to zosyn. SBO since resolved.  Assessment & Plan:   Principal Problem:   SBO (small bowel obstruction) (HCC) Active Problems:   S/P aortic valve replacement with bioprosthetic valve   S/P CABG (coronary artery bypass graft)   Atrial fibrillation (HCC)   Essential hypertension   Hypothyroidism   Aspiration pneumonia (HCC)   Sepsis (HCC)   AKI (acute kidney injury) ([place])   CKD (chronic kidney disease) stage 2, GFR 60-89 ml/min   Septic shock (HCC)   Community  acquired pneumonia of right lung   Pressure injury of skin  Septic shock and acute hypoxic respiratory failure due to aspiration pneumonia with left pleural effusion, POA: Shock resolved.  - Continued on zosyn.  - Continue supplemental oxygen as needed to maintain normal respiratory effort and SpO2 >90%.  - Sputum culture still pending, blood cultures NGTD - Wean O2 as tolerated, weaned to 2L today, cont to wean as tolerated - given concerns of aspiration, have consulted SLP. Pt cleared for soft diet  Partial SBO vs. ileus: Improving with contrast to rectum on AXR recently - DC'd NGT, advanced diet as tolerated. Surgery has signed off.   Hypokalemia:  -Resolved - Continue to replace as tolerated  Acute on chronic HFpEF:  -Following sepsis-protocol IV fluids. Noted to have pulmonary edema on XR -Recent CXR with worse findings suggestive of worsening edema - Echocardiogram shows normal LV systolic function, LVH, nl RV, nl IVC. -Given recent CXR findings, have been giving IV lasix.  -Tolerating lasix, will increase to 60mg  IV BID. Now diuresing well with 1300cc thus far today - Follow daily wts and I/O  Hypoglycemia:  - Continue monitoring with onset of diet.  CAD s/p CABG: No chest pain this AM.  - Continue anticoagulation, pt not on statin, - BP improved, tolerating beta blocker thus far  Chronic atrial fibrillation: Initially with RVR -On eliquis -Hr had recently been in the 100's over the past several days with improvement in bp -tolerating home metoprolol for rate control  AKI on stage IIIa CKD: -Overall improved, not back to baseline (~1.1) -Lasix had initially been on hold and pt had received brief IVF -Pt  clinically volume overloaded with edema on CXR -increased IV lasix to 60mg  IV BID, diuresing well now -Repeat bmet in AM  Hypothyroidism: TSH 2.3 - Continue synthroid as pt tolerates  DVT prophylaxis: Eliquis Code Status: DNR Family Communication: Pt  in room, had called and updated pateint's daughter over phone 5/6  Status is: Inpatient  Remains inpatient appropriate because:Unsafe d/c plan and IV treatments appropriate due to intensity of illness or inability to take PO   Dispo: The patient is from: Home              Anticipated d/c is to: SNF              Anticipated d/c date is: 3 days              Patient currently is not medically stable to d/c.        Consultants:   General Surgery  Critical Care  Procedures:     Antimicrobials: Anti-infectives (From admission, onward)   Start     Dose/Rate Route Frequency Ordered Stop   02/06/20 1600  vancomycin (VANCOCIN) IVPB 1000 mg/200 mL premix  Status:  Discontinued     1,000 mg 200 mL/hr over 60 Minutes Intravenous Every 48 hours 02/04/20 1517 02/05/20 1124   02/05/20 1600  ceFEPIme (MAXIPIME) 2 g in sodium chloride 0.9 % 100 mL IVPB  Status:  Discontinued     2 g 200 mL/hr over 30 Minutes Intravenous Every 24 hours 02/04/20 1517 02/05/20 1124   02/05/20 1500  piperacillin-tazobactam (ZOSYN) IVPB 3.375 g     3.375 g 12.5 mL/hr over 240 Minutes Intravenous Every 8 hours 02/05/20 1124     02/04/20 2100  metroNIDAZOLE (FLAGYL) IVPB 500 mg  Status:  Discontinued     500 mg 100 mL/hr over 60 Minutes Intravenous Every 8 hours 02/04/20 2058 02/05/20 1124   02/04/20 1345  ceFEPIme (MAXIPIME) 2 g in sodium chloride 0.9 % 100 mL IVPB     2 g 200 mL/hr over 30 Minutes Intravenous  Once 02/04/20 1334 02/04/20 1611   02/04/20 1345  metroNIDAZOLE (FLAGYL) IVPB 500 mg     500 mg 100 mL/hr over 60 Minutes Intravenous  Once 02/04/20 1334 02/04/20 1507   02/04/20 1345  vancomycin (VANCOCIN) IVPB 1000 mg/200 mL premix     1,000 mg 200 mL/hr over 60 Minutes Intravenous  Once 02/04/20 1334 02/04/20 1503      Subjective: Yelling out "I have to pee!" Denies chest pain, denies sob this AM  Objective: Vitals:   02/09/20 2207 02/10/20 0500 02/10/20 0505 02/10/20 1529  BP: 136/83   126/74 113/64  Pulse: 62  86 95  Resp: 17  16 20   Temp: 100.1 F (37.8 C)  99.5 F (37.5 C) 99.6 F (37.6 C)  TempSrc: Oral  Oral Oral  SpO2: (!) 34%  92% 92%  Weight:  83 kg    Height:        Intake/Output Summary (Last 24 hours) at 02/10/2020 1713 Last data filed at 02/10/2020 1710 Gross per 24 hour  Intake 250 ml  Output 1300 ml  Net -1050 ml   Filed Weights   02/08/20 0500 02/09/20 0500 02/10/20 0500  Weight: 70.2 kg 70 kg 83 kg    Examination: General exam: Conversant, in no acute distress Respiratory system: normal chest rise, clear, no audible wheezing Cardiovascular system: regular rhythm, s1-s2 Gastrointestinal system: Nondistended, nontender, pos BS Central nervous system: No seizures, no tremors Extremities: No cyanosis, no joint  deformities Skin: No rashes, no pallor Psychiatry: Affect normal // no auditory hallucinations   Data Reviewed: I have personally reviewed following labs and imaging studies  CBC: Recent Labs  Lab 02/04/20 1325 02/04/20 1325 02/05/20 0048 02/06/20 0431 02/07/20 0411 02/09/20 0230 02/10/20 0306  WBC 10.2   < > 13.3* 12.8* 12.1* 13.8* 16.7*  NEUTROABS 8.8*  --   --   --   --   --   --   HGB 11.3*   < > 10.0* 8.9* 9.5* 8.9* 9.3*  HCT 34.5*   < > 30.3* 27.8* 29.9* 28.2* 28.5*  MCV 91.0   < > 92.9 91.7 93.4 92.2 89.6  PLT 255   < > 217 147* 176 183 190   < > = values in this interval not displayed.   Basic Metabolic Panel: Recent Labs  Lab 02/04/20 1325 02/04/20 1325 02/05/20 0048 02/05/20 0048 02/06/20 0431 02/07/20 0411 02/08/20 0403 02/09/20 0230 02/10/20 0306  NA 135   < > 138   < > 140 140 137 133* 131*  K 3.1*   < > 3.5   < > 3.1* 3.2* 4.1 3.9 3.7  CL 92*   < > 97*   < > 99 106 103 98 97*  CO2 28   < > 28   < > 28 27 25 26 26   GLUCOSE 104*   < > 86   < > 77 107* 106* 160* 118*  BUN 59*   < > 62*   < > 66* 53* 44* 38* 31*  CREATININE 2.24*   < > 2.17*   < > 1.89* 1.64* 1.34* 1.32* 1.40*  CALCIUM 10.1   < > 9.3    < > 9.3 9.7 9.5 9.4 9.0  MG 2.2  --  2.1  --   --   --   --   --   --   PHOS  --   --  3.5  --   --   --   --   --   --    < > = values in this interval not displayed.   GFR: Estimated Creatinine Clearance: 34.6 mL/min (A) (by C-G formula based on SCr of 1.4 mg/dL (H)). Liver Function Tests: Recent Labs  Lab 02/04/20 1325 02/09/20 0230 02/10/20 0306  AST 20 18 15   ALT 14 15 13   ALKPHOS 53 54 51  BILITOT 1.7* 0.7 1.0  PROT 7.9 6.1* 6.0*  ALBUMIN 3.4* 2.1* 1.9*   No results for input(s): LIPASE, AMYLASE in the last 168 hours. No results for input(s): AMMONIA in the last 168 hours. Coagulation Profile: Recent Labs  Lab 02/04/20 1325  INR 1.6*   Cardiac Enzymes: No results for input(s): CKTOTAL, CKMB, CKMBINDEX, TROPONINI in the last 168 hours. BNP (last 3 results) Recent Labs    07/25/19 1033 08/10/19 1411 01/20/20 1217  PROBNP 3,330* 6,202* 2,330*   HbA1C: No results for input(s): HGBA1C in the last 72 hours. CBG: Recent Labs  Lab 02/10/20 0037 02/10/20 0418 02/10/20 0750 02/10/20 1156 02/10/20 1622  GLUCAP 126* 115* 102* 157* 184*   Lipid Profile: No results for input(s): CHOL, HDL, LDLCALC, TRIG, CHOLHDL, LDLDIRECT in the last 72 hours. Thyroid Function Tests: No results for input(s): TSH, T4TOTAL, FREET4, T3FREE, THYROIDAB in the last 72 hours. Anemia Panel: No results for input(s): VITAMINB12, FOLATE, FERRITIN, TIBC, IRON, RETICCTPCT in the last 72 hours. Sepsis Labs: Recent Labs  Lab 02/04/20 1819 02/04/20 2000 02/05/20 0048 02/05/20 [id number]  02/10/20 0306  PROCALCITON  --   --   --   --  2.12  LATICACIDVEN 3.6* 4.3* 3.1* 2.4*  --     Recent Results (from the past 240 hour(s))  Culture, blood (Routine x 2)     Status: None   Collection Time: 02/04/20  1:25 PM   Specimen: BLOOD RIGHT ARM  Result Value Ref Range Status   Specimen Description BLOOD RIGHT ARM  Final   Special Requests   Final    BOTTLES DRAWN AEROBIC AND ANAEROBIC Blood Culture  adequate volume   Culture   Final    NO GROWTH 5 DAYS Performed at [facility], [place] [address] [place], [place] [address]    Report Status 02/09/2020 FINAL  Final  Urine culture     Status: None   Collection Time: 02/04/20  1:32 PM   Specimen: In/Out Cath Urine  Result Value Ref Range Status   Specimen Description IN/OUT CATH URINE  Final   Special Requests NONE  Final   Culture   Final    NO GROWTH Performed at [facility], [place] [address] [place], [place] [address]    Report Status 02/06/2020 FINAL  Final  Culture, blood (Routine x 2)     Status: None   Collection Time: 02/04/20  2:00 PM   Specimen: BLOOD  Result Value Ref Range Status   Specimen Description BLOOD RIGHT ANTECUBITAL  Final   Special Requests   Final    BOTTLES DRAWN AEROBIC AND ANAEROBIC Blood Culture results may not be optimal due to an excessive volume of blood received in culture bottles   Culture   Final    NO GROWTH 5 DAYS Performed at [facility], [place] [address], [place], [place] [address]    Report Status 02/09/2020 FINAL  Final  Respiratory Panel by RT PCR (Flu A&B, Covid) - Nasopharyngeal Swab     Status: None   Collection Time: 02/04/20  3:33 PM   Specimen: Nasopharyngeal Swab  Result Value Ref Range Status   SARS Coronavirus 2 by RT PCR NEGATIVE NEGATIVE Final    Comment: (NOTE) SARS-CoV-2 target nucleic acids are NOT DETECTED. The SARS-CoV-2 RNA is generally detectable in upper respiratoy specimens during the acute phase of infection. The lowest concentration of SARS-CoV-2 viral copies this assay can detect is 131 copies/mL. A negative result does not preclude SARS-Cov-2 infection and should not be used as the sole basis for treatment or other patient management decisions. A negative result may occur with  improper specimen collection/handling, submission of specimen other than nasopharyngeal swab, presence of viral mutation(s) within the areas targeted by this assay, and  inadequate number of viral copies (<131 copies/mL). A negative result must be combined with clinical observations, patient history, and epidemiological information. The expected result is Negative. Fact Sheet for Patients:  PinkCheek.be Fact Sheet for Healthcare Providers:  GravelBags.it This test is not yet ap proved or cleared by the [place] FDA and  has been authorized for detection and/or diagnosis of SARS-CoV-2 by FDA under an Emergency Use Authorization (EUA). This EUA will remain  in effect (meaning this test can be used) for the duration of the COVID-19 declaration under Section 564(b)(1) of the Act, 21 U.S.C. section 360bbb-3(b)(1), unless the authorization is terminated or revoked sooner.    Influenza A by PCR NEGATIVE NEGATIVE Final   Influenza B by PCR NEGATIVE NEGATIVE Final    Comment: (NOTE) The Xpert Xpress SARS-CoV-2/FLU/RSV assay  is intended as an aid in  the diagnosis of influenza from Nasopharyngeal swab specimens and  should not be used as a sole basis for treatment. Nasal washings and  aspirates are unacceptable for Xpert Xpress SARS-CoV-2/FLU/RSV  testing. Fact Sheet for Patients: PinkCheek.be Fact Sheet for Healthcare Providers: GravelBags.it This test is not yet approved or cleared by the [place] FDA and  has been authorized for detection and/or diagnosis of SARS-CoV-2 by  FDA under an Emergency Use Authorization (EUA). This EUA will remain  in effect (meaning this test can be used) for the duration of the  Covid-19 declaration under Section 564(b)(1) of the Act, 21  U.S.C. section 360bbb-3(b)(1), unless the authorization is  terminated or revoked. Performed at [facility], [place] [address], [place], [place] [address]   MRSA PCR Screening     Status: None   Collection Time: 02/04/20 11:47 PM   Specimen: Nasal Mucosa;  Nasopharyngeal  Result Value Ref Range Status   MRSA by PCR NEGATIVE NEGATIVE Final    Comment:        The GeneXpert MRSA Assay (FDA approved for NASAL specimens only), is one component of a comprehensive MRSA colonization surveillance program. It is not intended to diagnose MRSA infection nor to guide or monitor treatment for MRSA infections. Performed at [facility], [place] [address], [place], [place] [address]   SARS CORONAVIRUS 2 (TAT 6-24 HRS) Nasopharyngeal Nasopharyngeal Swab     Status: None   Collection Time: 02/08/20  2:09 PM   Specimen: Nasopharyngeal Swab  Result Value Ref Range Status   SARS Coronavirus 2 NEGATIVE NEGATIVE Final    Comment: (NOTE) SARS-CoV-2 target nucleic acids are NOT DETECTED. The SARS-CoV-2 RNA is generally detectable in upper and lower respiratory specimens during the acute phase of infection. Negative results do not preclude SARS-CoV-2 infection, do not rule out co-infections with other pathogens, and should not be used as the sole basis for treatment or other patient management decisions. Negative results must be combined with clinical observations, patient history, and epidemiological information. The expected result is Negative. Fact Sheet for Patients: SugarRoll.be Fact Sheet for Healthcare Providers: https://www.woods-mathews.com/ This test is not yet approved or cleared by the [place] FDA and  has been authorized for detection and/or diagnosis of SARS-CoV-2 by FDA under an Emergency Use Authorization (EUA). This EUA will remain  in effect (meaning this test can be used) for the duration of the COVID-19 declaration under Section 56 4(b)(1) of the Act, 21 U.S.C. section 360bbb-3(b)(1), unless the authorization is terminated or revoked sooner. Performed at [facility], [place] [address], [place], [place] [address]      Radiology Studies: DG CHEST PORT 1 VIEW  Result Date:  02/10/2020 CLINICAL DATA:  Cough, congestive heart failure. EXAM: PORTABLE CHEST 1 VIEW COMPARISON:  Feb 08, 2020. FINDINGS: Stable cardiomegaly. Status post coronary artery bypass graft and aortic valve repair. Atherosclerosis thoracic aorta is noted. No pneumothorax is noted. Stable bilateral lung opacities are noted most consistent with pulmonary edema. Small bilateral pleural effusions are noted. Bony thorax is unremarkable. IMPRESSION: Aortic atherosclerosis. Stable bilateral lung opacities are noted most consistent with pulmonary edema. Small bilateral pleural effusions are noted. Aortic Atherosclerosis (ICD10-I70.0). Electronically Signed   By: Marijo Conception M.D.   On: 02/10/2020 08:11    Scheduled Meds: . apixaban  2.5 mg Oral BID  . Chlorhexidine Gluconate Cloth  6 each Topical Q0600  . feeding supplement (ENSURE ENLIVE)  237  mL Oral BID BM  . furosemide  60 mg Intravenous BID  . levothyroxine  100 mcg Oral Q0600  . metoprolol succinate  25 mg Oral Daily  . multivitamin with minerals  1 tablet Oral Daily  . pantoprazole  40 mg Oral Daily   Continuous Infusions: . sodium chloride Stopped (02/06/20 0117)  . piperacillin-tazobactam (ZOSYN)  IV 3.375 g (02/10/20 1353)     LOS: 6 days   Marylu Lund, MD [facility] Pager On Amion  If 7PM-7AM, please contact night-coverage 02/10/2020, 5:13 PM

## 2020-02-10 NOTE — Evaluation (Signed)
Clinical/Bedside Swallow Evaluation Patient Details  Name: [name] MRN: [id number] Date of Birth: 1924/06/10  Today's Date: 02/10/2020 Time: SLP Start Time (ACUTE ONLY): 1440 SLP Stop Time (ACUTE ONLY): 1455 SLP Time Calculation (min) (ACUTE ONLY): 15 min  Past Medical History:  Past Medical History:  Diagnosis Date  . Aortic stenosis 05/27/12   Severe to critical  . Arthritis    OSTEO OF KNEE  . Atrial fibrillation ([place]) 09/21/2012   Post operative after bypass surgery converting on amiodarone   . Bifascicular bundle branch block 02/09/2018  . Borderline diabetes   . BPH (benign prostatic hypertrophy)   . Coronary artery disease   . GI bleed   . Heart murmur   . Hypercholesteremia   . Hypertension   . Hyponatremia 07/23/2016  . Hypothyroidism 07/23/2016  . S/P aortic valve replacement with bioprosthetic valve    #23 bioprosthetic aortic valve December 2013   . S/P CABG (coronary artery bypass graft) 09/16/2012   Aortic valve replacement with a pericardial tissue  valve, [employer] 23 mm [id number] and coronary artery bypass grafting x3  with the left internal mammary to the left anterior descending coronary  artery, reverse saphenous vein graft to the circumflex coronary artery,  and reverse saphenous vein graft to the distal right coronary artery  with right leg endo vein harvesting.  09/17/2012   . SIADH (syndrome of inappropriate ADH production) ([place]) 07/23/2016  . Ulcer    PEPTIC ULCER DZ...DR. Watt Climes   Past Surgical History:  Past Surgical History:  Procedure Laterality Date  . AORTIC VALVE REPLACEMENT  09/17/2012   Procedure: AORTIC VALVE REPLACEMENT (AVR);  Surgeon: Grace Isaac, MD;  Location: [facility];  Service: Open Heart Surgery;  Laterality: N/A;  . APPENDECTOMY    . CARDIAC VALVE REPLACEMENT  09-17-2012  . CORONARY ARTERY BYPASS GRAFT  09-17-2012   CABG x 3/ AVR  . CORONARY ARTERY BYPASS GRAFT  09/17/2012   Procedure: CORONARY ARTERY BYPASS  GRAFTING (CABG);  Surgeon: Grace Isaac, MD;  Location: [facility];  Service: Open Heart Surgery;  Laterality: N/A;  CABG x three,  using left internal mammary artery and right leg greater saphenous vein harvested endoscopically  . INTRAOPERATIVE TRANSESOPHAGEAL ECHOCARDIOGRAM  09/17/2012   Procedure: INTRAOPERATIVE TRANSESOPHAGEAL ECHOCARDIOGRAM;  Surgeon: Grace Isaac, MD;  Location: [facility];  Service: Open Heart Surgery;  Laterality: N/A;   HPI:  Pt is a 86 y/o male admitted secondary to partial SBO vs ileus and suspected aspiration PNA with septic shock and AKI. PMH includes s/p AVR, CAD s/p CABG, a fib, CKD, and HTN.  NG D/Cd on 5/4 and advanced to full liquid diet; plan is for D/C to SNF.    Assessment / Plan / Recommendation Clinical Impression  Pt interactive, pleasant, presents with functional oropharyngeal swallow with adequate mastication, brisk swallow response, no s/s of aspiration while consuming regular solids, purees, and thin liquids.  There is no indication that aspiration during swallowing is a current concern.  Recommend continuing soft diet, thin liquids. No SLP f/u is needed. D/W wife.  SLP Visit Diagnosis: Dysphagia, oropharyngeal phase (R13.12)    Aspiration Risk  No limitations    Diet Recommendation   soft diet, thin liquids  Medication Administration: Whole meds with liquid    Other  Recommendations Oral Care Recommendations: Oral care BID   Follow up Recommendations None  Swallow Study   General HPI: Pt is a 86 y/o male admitted secondary to partial SBO vs ileus and suspected aspiration PNA with septic shock and AKI. PMH includes s/p AVR, CAD s/p CABG, a fib, CKD, and HTN.  NG D/Cd on 5/4 and advanced to full liquid diet; plan is for D/C to SNF.  Type of Study: Bedside Swallow Evaluation Previous Swallow Assessment: no Diet Prior to this Study: Dysphagia 3 (soft);Thin liquids Temperature Spikes Noted: No Respiratory Status: Nasal  cannula History of Recent Intubation: No Behavior/Cognition: Alert;Cooperative Oral Cavity Assessment: Within Functional Limits Oral Care Completed by SLP: No Oral Cavity - Dentition: Missing dentition;Poor condition Vision: Functional for self-feeding Self-Feeding Abilities: Able to feed self Patient Positioning: Upright in bed Baseline Vocal Quality: Normal Volitional Cough: Strong Volitional Swallow: Able to elicit    Oral/Motor/Sensory Function Overall Oral Motor/Sensory Function: Within functional limits   Ice Chips Ice chips: Within functional limits   Thin Liquid Thin Liquid: Within functional limits    Nectar Thick Nectar Thick Liquid: Not tested   Honey Thick Honey Thick Liquid: Not tested   Puree Puree: Within functional limits   Solid     Solid: Within functional limits     [name], [name] Office number [phone] Pager [phone]  [name] 02/10/2020,2:57 PM

## 2020-02-10 NOTE — Progress Notes (Signed)
Initial Nutrition Assessment  DOCUMENTATION CODES:   Not applicable  INTERVENTION:   -Magic cup BID with meals, each supplement provides 290 kcal and 9 grams of protein -Ensure Enlive po BID, each supplement provides 350 kcal and 20 grams of protein -MVI with minerals daily  NUTRITION DIAGNOSIS:   Inadequate oral intake related to altered GI function as evidenced by meal completion < 50%.  GOAL:   Patient will meet greater than or equal to 90% of their needs  MONITOR:   PO intake, Supplement acceptance, Labs, Weight trends, Skin, I & O's  REASON FOR ASSESSMENT:   Low Braden    ASSESSMENT:   86 y.o. male with PMH aortic stenosis, CAD, bioprosthetic aortic valve, atrial fibrillation/flutter on Eliquis, HTN and chronic left pleural effusion presented to ER with vomiting and admitted for SBO and likely aspiration PNA.  Pt admitted with SBO.   5/1- NGT placed 5/4- NGT d/c, advanced to full liquid diet, advanced to soft diet  Reviewed I/O's: +50 ml x 24 hours and +6.4 L since admission  Pt sitting up in bed at time of visit, pleasantly confused. He was able to state his name and date of birth.   PTA, pt reports good appetite, stating "I eat everything". He is unsure if he ate breakfast this morning. Noted meal completion 25-75%, averaging around 50% of meals.  Reviewed wt hx; no wt loss noted.   Pt would greatly benefit from addition of oral nutrition supplements.   Plan for SNF at time of discharge.   Labs reviewed: Na: 131, CBGS: 102-126.  NUTRITION - FOCUSED PHYSICAL EXAM:    Most Recent Value  Orbital Region  No depletion  Upper Arm Region  Mild depletion  Thoracic and Lumbar Region  No depletion  Buccal Region  No depletion  Temple Region  Mild depletion  Clavicle Bone Region  Mild depletion  Clavicle and Acromion Bone Region  Mild depletion  Scapular Bone Region  Mild depletion  Dorsal Hand  Mild depletion  Patellar Region  No depletion  Anterior Thigh  Region  No depletion  Posterior Calf Region  No depletion  Edema (RD Assessment)  Mild  Hair  Reviewed  Eyes  Reviewed  Mouth  Reviewed  Skin  Reviewed  Nails  Reviewed       Diet Order:   Diet Order            DIET SOFT Room service appropriate? Yes with Assist; Fluid consistency: Thin  Diet effective now              EDUCATION NEEDS:   Not appropriate for education at this time  Skin:  Skin Assessment: Skin Integrity Issues: Skin Integrity Issues:: Stage I Stage I: sacrum  Last BM:  02/07/20  Height:   Ht Readings from Last 1 Encounters:  02/04/20 5\' 8"  (1.727 m)    Weight:   Wt Readings from Last 1 Encounters:  02/10/20 83 kg    Ideal Body Weight:  70 kg  BMI:  Body mass index is 27.83 kg/m.  Estimated Nutritional Needs:   Kcal:  1800-2000  Protein:  90-105 grams  Fluid:  > 1.8 L    Loistine Chance, RD, LDN, [place] Registered Dietitian II Certified Diabetes Care and Education Specialist Please refer to [facility] for RD and/or RD on-call/weekend/after hours pager

## 2020-02-10 NOTE — Progress Notes (Signed)
Physical Therapy Treatment Patient Details Name: [name] MRN: [id number] DOB: [birth year] Today's Date: 02/10/2020    History of Present Illness Pt is a 86 y/o male admitted secondary to partial SBO vs ileus and suspected aspiration PNA with septic shock and AKI. PMH includes s/p AVR, CAD s/p CABG, a fib, CKD, and HTN.     PT Comments    Pt progressing with mobility; pleasant and agreeable to participate. Today's session focused on transfer training and progressing gait distance, pt requires consistent modA+2 for mobility with RW; session limited by multiple bouts of bowel incontinence, pt dependent for pericare. SpO2 down to 85% on 1L O2 [name], ultimately requiring 3L to maintain 88-92% with activity (RN aware). Wife present and supportive. Continue to recommend SNF-level therapies to maximize functional mobility and independence.   Follow Up Recommendations  SNF;Supervision/Assistance - 24 hour     Equipment Recommendations  (defer)    Recommendations for Other Services       Precautions / Restrictions Precautions Precautions: Fall;Other (comment) Precaution Comments: Bowel incontience Restrictions Weight Bearing Restrictions: No    Mobility  Bed Mobility Overal bed mobility: Needs Assistance Bed Mobility: Supine to Sit     Supine to sit: Mod assist     General bed mobility comments: able to initate LB and trunk movement, but requires mod assist to complete scooting and trunk elevation to EOB   Transfers Overall transfer level: Needs assistance Equipment used: Rolling walker (2 wheeled) Transfers: Sit to/from Stand Sit to Stand: Mod assist;+2 physical assistance         General transfer comment: Pt required max(A) +2 sit to stand  RW; stood 2x more from recliner due to bowel incontinence, consistent modA+2 for trunk elevation and tactile cues for hand placement; verbal/tactile cues for trunk extension  Ambulation/Gait Ambulation/Gait assistance: Min assist;+2  physical assistance Gait Distance (Feet): 6 Feet Assistive device: Rolling walker (2 wheeled) Gait Pattern/deviations: Step-to pattern;Trunk flexed;Leaning posteriorly   Gait velocity interpretation: <1.31 ft/sec, indicative of household ambulator General Gait Details: Steps to recliner with RW and min-modA+2 for balance and RW navigation; seated rest then additional steps forward with chair follow. Max verbal and tactile cues for bigger steps, pt moving slowly   Stairs             Wheelchair Mobility    Modified Rankin (Stroke Patients Only)       Balance Overall balance assessment: Needs assistance Sitting-balance support: Bilateral upper extremity supported;Feet supported Sitting balance-Leahy Scale: Fair       Standing balance-Leahy Scale: Poor Standing balance comment: Reliant on UE support and external assist                            Cognition Arousal/Alertness: Awake/alert Behavior During Therapy: WFL for tasks assessed/performed Overall Cognitive Status: History of cognitive impairments - at baseline Area of Impairment: Following commands;Problem solving;Awareness;Attention                   Current Attention Level: Focused;Sustained   Following Commands: Follows one step commands consistently;Follows one step commands with increased time     Problem Solving: Slow processing;Decreased initiation;Requires verbal cues;Requires tactile cues General Comments: pt history of cognitive impairments at baseline. pt demonstrated poor sustained attention, required verbal and tactile cues for mobility, answering questions with increased time. exacerbated by [facility]      Exercises      General Comments General comments (skin integrity, edema, etc.): Wife  present. Spo2 down to 85% on 1L O2, ultimately requiring 3L to maintain >/88% with activity (RN notified)      Pertinent Vitals/Pain Pain Assessment: No/denies pain    Home Living                       Prior Function            PT Goals (current goals can now be found in the care plan section) Progress towards PT goals: Progressing toward goals(slowly)    Frequency    Min 2X/week      PT Plan Current plan remains appropriate    Co-evaluation              AM-PAC PT "6 Clicks" Mobility   Outcome Measure  Help needed turning from your back to your side while in a flat bed without using bedrails?: A Lot Help needed moving from lying on your back to sitting on the side of a flat bed without using bedrails?: A Lot Help needed moving to and from a bed to a chair (including a wheelchair)?: A Lot Help needed standing up from a chair using your arms (e.g., wheelchair or bedside chair)?: A Lot Help needed to walk in hospital room?: A Lot Help needed climbing 3-5 steps with a railing? : Total 6 Click Score: 11    End of Session Equipment Utilized During Treatment: Gait belt;Oxygen Activity Tolerance: Patient tolerated treatment well Patient left: in chair;with call bell/phone within reach;with chair alarm set;with family/visitor present Nurse Communication: Mobility status PT Visit Diagnosis: Unsteadiness on feet (R26.81);Muscle weakness (generalized) (M62.81);Difficulty in walking, not elsewhere classified (R26.2)     Time: [id number] PT Time Calculation (min) (ACUTE ONLY): 39 min  Charges:  $Therapeutic Activity: 38-52 mins                    Mabeline Caras, PT, DPT Acute Rehabilitation Services  Pager [phone] Office [place] 02/10/2020, 5:29 PM

## 2020-02-10 NOTE — Plan of Care (Signed)

## 2020-02-10 NOTE — Progress Notes (Addendum)
ANTICOAGULATION CONSULT NOTE - Follow Up Consult  Pharmacy Consult for Eliquis Indication: atrial fibrillation  No Known Allergies  Patient Measurements: Height: 5\' 8"  (172.7 cm) Weight: 83 kg (183 lb) IBW/kg (Calculated) : 68.4  Vital Signs: Temp: 99.5 F (37.5 C) (05/07 0505) Temp Source: Oral (05/07 0505) BP: 126/74 (05/07 0505) Pulse Rate: 86 (05/07 0505)  Labs: Recent Labs    02/08/20 0403 02/09/20 0230 02/10/20 0306  HGB  --  8.9* 9.3*  HCT  --  28.2* 28.5*  PLT  --  183 190  CREATININE 1.34* 1.32* 1.40*    Estimated Creatinine Clearance: 34.6 mL/min (A) (by C-G formula based on SCr of 1.4 mg/dL (H)).  Assessment:  Anticoag: Afib on Eliquis PTA (LD 4/30 PM) for afib >> Heparin d/t possible SBO/procedures. Resume Eliquis 5/4. Hgb 9.3. Plts improving. - Reduced dose as PTA for for age>80, and some renal dysfunction  Goal of Therapy: Therapeutic oral anticoagulation\   Plan:  Eliquis 2.5mg  BID. AVS posted Zosyn EID 3.375gm IV Q8H per MD - f/u LOT Watch WBC increasing. Pharmacy will sign off. Please reconsult for further dosing assitance.    Azizah Lisle S. Alford Highland, PharmD, BCPS Clinical Staff Pharmacist [email or web address] Alford Highland, [employer] 02/10/2020,8:48 AM

## 2020-02-11 LAB — COMPREHENSIVE METABOLIC PANEL
ALT: 10 U/L (ref 0–44)
AST: 16 U/L (ref 15–41)
Albumin: 1.9 g/dL — ABNORMAL LOW (ref 3.5–5.0)
Alkaline Phosphatase: 62 U/L (ref 38–126)
Anion gap: 11 (ref 5–15)
BUN: 26 mg/dL — ABNORMAL HIGH (ref 8–23)
CO2: 26 mmol/L (ref 22–32)
Calcium: 8.9 mg/dL (ref 8.9–10.3)
Chloride: 92 mmol/L — ABNORMAL LOW (ref 98–111)
Creatinine, Ser: 1.45 mg/dL — ABNORMAL HIGH (ref 0.61–1.24)
GFR calc Af Amer: 48 mL/min — ABNORMAL LOW (ref >60)
GFR calc non Af Amer: 41 mL/min — ABNORMAL LOW (ref >60)
Glucose, Bld: 131 mg/dL — ABNORMAL HIGH (ref 70–99)
Potassium: 3.3 mmol/L — ABNORMAL LOW (ref 3.5–5.1)
Sodium: 129 mmol/L — ABNORMAL LOW (ref 135–145)
Total Bilirubin: 1.3 mg/dL — ABNORMAL HIGH (ref 0.3–1.2)
Total Protein: 6.2 g/dL — ABNORMAL LOW (ref 6.5–8.1)

## 2020-02-11 LAB — CBC WITH DIFFERENTIAL/PLATELET
Abs Immature Granulocytes: 0.27 10*3/uL — ABNORMAL HIGH (ref 0.00–0.07)
Basophils Absolute: 0.1 10*3/uL (ref 0.0–0.1)
Basophils Relative: 0 %
Eosinophils Absolute: 0 10*3/uL (ref 0.0–0.5)
Eosinophils Relative: 0 %
HCT: 29.2 % — ABNORMAL LOW (ref 39.0–52.0)
Hemoglobin: 9.5 g/dL — ABNORMAL LOW (ref 13.0–17.0)
Immature Granulocytes: 1 %
Lymphocytes Relative: 7 %
Lymphs Abs: 1.3 10*3/uL (ref 0.7–4.0)
MCH: 29.4 pg (ref 26.0–34.0)
MCHC: 32.5 g/dL (ref 30.0–36.0)
MCV: 90.4 fL (ref 80.0–100.0)
Monocytes Absolute: 1.6 10*3/uL — ABNORMAL HIGH (ref 0.1–1.0)
Monocytes Relative: 9 %
Neutro Abs: 15.4 10*3/uL — ABNORMAL HIGH (ref 1.7–7.7)
Neutrophils Relative %: 83 %
Platelets: 223 10*3/uL (ref 150–400)
RBC: 3.23 MIL/uL — ABNORMAL LOW (ref 4.22–5.81)
RDW: 14.3 % (ref 11.5–15.5)
WBC: 18.6 10*3/uL — ABNORMAL HIGH (ref 4.0–10.5)
nRBC: 0 % (ref 0.0–0.2)

## 2020-02-11 LAB — CBC
HCT: 29.2 % — ABNORMAL LOW (ref 39.0–52.0)
Hemoglobin: 9.5 g/dL — ABNORMAL LOW (ref 13.0–17.0)
MCH: 29.1 pg (ref 26.0–34.0)
MCHC: 32.5 g/dL (ref 30.0–36.0)
MCV: 89.3 fL (ref 80.0–100.0)
Platelets: 219 10*3/uL (ref 150–400)
RBC: 3.27 MIL/uL — ABNORMAL LOW (ref 4.22–5.81)
RDW: 14.3 % (ref 11.5–15.5)
WBC: 18.6 10*3/uL — ABNORMAL HIGH (ref 4.0–10.5)
nRBC: 0 % (ref 0.0–0.2)

## 2020-02-11 LAB — GLUCOSE, CAPILLARY
Glucose-Capillary: 131 mg/dL — ABNORMAL HIGH (ref 70–99)
Glucose-Capillary: 165 mg/dL — ABNORMAL HIGH (ref 70–99)
Glucose-Capillary: 129 mg/dL — ABNORMAL HIGH (ref 70–99)
Glucose-Capillary: 157 mg/dL — ABNORMAL HIGH (ref 70–99)

## 2020-02-11 MED ORDER — FUROSEMIDE 10 MG/ML IJ SOLN
60.0000 mg | Freq: Once | INTRAMUSCULAR | Status: AC
  Administered 2020-02-11: 60 mg via INTRAVENOUS
  Filled 2020-02-11: qty 6

## 2020-02-11 MED ORDER — POTASSIUM CHLORIDE CRYS ER 20 MEQ PO TBCR
40.0000 meq | EXTENDED_RELEASE_TABLET | Freq: Once | ORAL | Status: AC
  Administered 2020-02-11: 40 meq via ORAL
  Filled 2020-02-11: qty 2

## 2020-02-11 MED ORDER — ACETAMINOPHEN 325 MG PO TABS
650.0000 mg | ORAL_TABLET | Freq: Four times a day (QID) | ORAL | Status: DC | PRN
  Administered 2020-02-11 – 2020-02-15 (×3): 650 mg via ORAL
  Filled 2020-02-11 (×3): qty 2

## 2020-02-11 NOTE — Patient Care Conference (Signed)
Called and updated patient's daughter. All questions answered. Family made aware of good diuresis overnight, still requiring 1LNC today. Concerns we are nearing what pt can tolerate regarding diuretic with concern of becoming dehydrated, family wishing to proceed with one more dose of lasix for today. Ordered. Also aware of rising WBC. Blood cx pending. Abx to complete tonight after 8 days of tx

## 2020-02-11 NOTE — Progress Notes (Signed)
PROGRESS NOTE    [name]  [id number] DOB: 03-02-1924 DOA: 02/04/2020 PCP: Mayra Neer, MD    Brief Narrative:  86 y.o. male with a history of CAD s/p CABG and AS AVR 2013, AFib on eliquis, HTN, and hypothyroidism who presented on 5/1 with SBO versus ileus and suspected aspiration pneumonia with septic shock requiring pressor support, AKI, and AFib with RVR.   In the [id number] to 102.8, tachypneic and tacycardic. BP's low normal. Patient requring 10 L HFNC. Labs remarkable for: K 3.1, Cr 2.24. Lactate 2.3>2.8>3.6>4.3. WBC 10.2, INR 18.3, Mag 2.2, PTT 38. GI consulted for possible SBP and will follow. NGT placed. Cefepime, Vanc, Flagyl for possible PNA. Zofran given. S/p 3L IVF. [id number] improved aeration compared to the plain film of 06/22/2019, there is evidence of acute CHF and bilateral small pleuraleffusions. CT abd/pelvis showed: Multiple dilated jejunal loops with somewhat decompressed colon. No transition point identified. Small amount of free fluid over the right pericolic gutter. Findings may be due to early/partial small bowel obstruction versus ileus. 2. Moderate airspace opacification over the mid to lower right lung and left base likely due to infection with associated basilar atelectasis. Moderate size left effusion with enhancing pleura suggesting empyema.  He was admitted to the ICU, NG tube placed, surgery consulted. Hemodynamics have improved, weaned from pressors, antibiotics narrowed to zosyn. SBO since resolved.  Assessment & Plan:   Principal Problem:   SBO (small bowel obstruction) (HCC) Active Problems:   S/P aortic valve replacement with bioprosthetic valve   S/P CABG (coronary artery bypass graft)   Atrial fibrillation (HCC)   Essential hypertension   Hypothyroidism   Aspiration pneumonia (HCC)   Sepsis (HCC)   AKI (acute kidney injury) ([place])   CKD (chronic kidney disease) stage 2, GFR 60-89 ml/min   Septic shock (HCC)   Community  acquired pneumonia of right lung   Pressure injury of skin  Septic shock and acute hypoxic respiratory failure due to aspiration pneumonia with left pleural effusion, POA: Shock resolved.  - Continued on zosyn.  - Continue supplemental oxygen as needed to maintain normal respiratory effort and SpO2 >90%.  - Sputum culture still pending, blood cultures NGTD - Wean O2 as tolerated, weaned to 1L today, cont to wean as tolerated - given concerns of aspiration, have consulted SLP. Pt cleared for soft diet - Labs reviewed, WBC has been trending up over the past several days, currently 18k. No obvious source of infection. UA clear -Will check blood cx -discussed with pharmacy. Plan to complete day 8 of zosyn today -will repeat CBC in AM  Partial SBO vs. ileus: Improving with contrast to rectum on AXR recently - DC'd NGT, advanced diet as tolerated. Surgery has signed off. -Pt moving bowels. Abd nondistended   Hypokalemia:  -Low this AM -Have replaced -Repeat bmet in AM  Acute on chronic HFpEF:  -Following sepsis-protocol IV fluids. Pt did receive multiple boluses in addition to fluids in IV meds. Noted to have pulmonary edema on XR -Recent CXR with worse findings suggestive of worsening edema - Echocardiogram shows normal LV systolic function, LVH, nl RV, nl IVC. -Given recent CXR findings, have been giving IV lasix.  -had tolerated lasix at 60mg  IV BID. Diuresed 2.4L overnight - Follow daily wts and I/O  Hypoglycemia:  - Continue monitoring with onset of diet.  CAD s/p CABG: No chest pain this AM.  - Continue anticoagulation, pt not on statin, - BP improved, pt tolerating beta blocker thus far  Chronic atrial fibrillation: Initially with RVR -On eliquis -Hr had recently been in the 100's over the past several days with improvement in bp -tolerating home metoprolol for rate control  AKI on stage IIIa CKD: -Overall improved, not back to baseline (~1.1) -Lasix had  initially been on hold and pt had received brief IVF -Pt clinically volume overloaded with edema on CXR -increased IV lasix to 60mg  IV BID, diuresed well -Repeat bmet in AM  Hypothyroidism: TSH 2.3 - Continue synthroid as pt tolerates  DVT prophylaxis: Eliquis Code Status: DNR Family Communication: Pt in room, had called and updated pateint's daughter over phone 5/6  Status is: Inpatient  Remains inpatient appropriate because:Unsafe d/c plan and IV treatments appropriate due to intensity of illness or inability to take PO   Dispo: The patient is from: Home              Anticipated d/c is to: SNF              Anticipated d/c date is: 3 days              Patient currently is not medically stable to d/c.   Consultants:   General Surgery  Critical Care  Procedures:     Antimicrobials: Anti-infectives (From admission, onward)   Start     Dose/Rate Route Frequency Ordered Stop   02/06/20 1600  vancomycin (VANCOCIN) IVPB 1000 mg/200 mL premix  Status:  Discontinued     1,000 mg 200 mL/hr over 60 Minutes Intravenous Every 48 hours 02/04/20 1517 02/05/20 1124   02/05/20 1600  ceFEPIme (MAXIPIME) 2 g in sodium chloride 0.9 % 100 mL IVPB  Status:  Discontinued     2 g 200 mL/hr over 30 Minutes Intravenous Every 24 hours 02/04/20 1517 02/05/20 1124   02/05/20 1500  piperacillin-tazobactam (ZOSYN) IVPB 3.375 g     3.375 g 12.5 mL/hr over 240 Minutes Intravenous Every 8 hours 02/05/20 1124 02/11/20 2359   02/04/20 2100  metroNIDAZOLE (FLAGYL) IVPB 500 mg  Status:  Discontinued     500 mg 100 mL/hr over 60 Minutes Intravenous Every 8 hours 02/04/20 2058 02/05/20 1124   02/04/20 1345  ceFEPIme (MAXIPIME) 2 g in sodium chloride 0.9 % 100 mL IVPB     2 g 200 mL/hr over 30 Minutes Intravenous  Once 02/04/20 1334 02/04/20 1611   02/04/20 1345  metroNIDAZOLE (FLAGYL) IVPB 500 mg     500 mg 100 mL/hr over 60 Minutes Intravenous  Once 02/04/20 1334 02/04/20 1507   02/04/20 1345   vancomycin (VANCOCIN) IVPB 1000 mg/200 mL premix     1,000 mg 200 mL/hr over 60 Minutes Intravenous  Once 02/04/20 1334 02/04/20 1503      Subjective: Without complaints this AM. Does mention feeling thirsty  Objective: Vitals:   02/10/20 1802 02/10/20 2127 02/11/20 0507 02/11/20 1429  BP:  128/66 (!) 112/53 108/65  Pulse:  79 84 84  Resp:  18 20 19   Temp:  99.6 F (37.6 C) 99.1 F (37.3 C) 98.8 F (37.1 C)  TempSrc:  Oral Oral Oral  SpO2:   93% 98%  Weight: 71.3 kg     Height:        Intake/Output Summary (Last 24 hours) at 02/11/2020 1505 Last data filed at 02/11/2020 1430 Gross per 24 hour  Intake 830 ml  Output 2050 ml  Net -1220 ml   Filed Weights   02/09/20 0500 02/10/20 0500 02/10/20 1802  Weight: 70 kg 83 kg  71.3 kg    Examination: General exam: Awake, laying in bed, in nad Respiratory system: Normal respiratory effort, no wheezing Cardiovascular system: regular rate, s1, s2 Gastrointestinal system: Soft, nondistended, positive BS Central nervous system: CN2-12 grossly intact, strength intact Extremities: Perfused, no clubbing Skin: Normal skin turgor, no notable skin lesions seen Psychiatry: Mood normal // no visual hallucinations   Data Reviewed: I have personally reviewed following labs and imaging studies  CBC: Recent Labs  Lab 02/06/20 0431 02/07/20 0411 02/09/20 0230 02/10/20 0306 02/11/20 0346  WBC 12.8* 12.1* 13.8* 16.7* 18.6*  18.6*  NEUTROABS  --   --   --   --  15.4*  HGB 8.9* 9.5* 8.9* 9.3* 9.5*  9.5*  HCT 27.8* 29.9* 28.2* 28.5* 29.2*  29.2*  MCV 91.7 93.4 92.2 89.6 90.4  89.3  PLT 147* 176 183 190 223  [id number]   Basic Metabolic Panel: Recent Labs  Lab 02/05/20 0048 02/06/20 0431 02/07/20 0411 02/08/20 0403 02/09/20 0230 02/10/20 0306 02/11/20 0346  NA 138   < > 140 137 133* 131* 129*  K 3.5   < > 3.2* 4.1 3.9 3.7 3.3*  CL 97*   < > 106 103 98 97* 92*  CO2 28   < > 27 25 26 26 26   GLUCOSE 86   < > 107* 106* 160* 118* 131*   BUN 62*   < > 53* 44* 38* 31* 26*  CREATININE 2.17*   < > 1.64* 1.34* 1.32* 1.40* 1.45*  CALCIUM 9.3   < > 9.7 9.5 9.4 9.0 8.9  MG 2.1  --   --   --   --   --   --   PHOS 3.5  --   --   --   --   --   --    < > = values in this interval not displayed.   GFR: Estimated Creatinine Clearance: 30.8 mL/min (A) (by C-G formula based on SCr of 1.45 mg/dL (H)). Liver Function Tests: Recent Labs  Lab 02/09/20 0230 02/10/20 0306 02/11/20 0346  AST 18 15 16   ALT 15 13 10   ALKPHOS 54 51 62  BILITOT 0.7 1.0 1.3*  PROT 6.1* 6.0* 6.2*  ALBUMIN 2.1* 1.9* 1.9*   No results for input(s): LIPASE, AMYLASE in the last 168 hours. No results for input(s): AMMONIA in the last 168 hours. Coagulation Profile: No results for input(s): INR, PROTIME in the last 168 hours. Cardiac Enzymes: No results for input(s): CKTOTAL, CKMB, CKMBINDEX, TROPONINI in the last 168 hours. BNP (last 3 results) Recent Labs    07/25/19 1033 08/10/19 1411 01/20/20 1217  PROBNP 3,330* 6,202* 2,330*   HbA1C: No results for input(s): HGBA1C in the last 72 hours. CBG: Recent Labs  Lab 02/10/20 1156 02/10/20 1622 02/10/20 2123 02/11/20 0809 02/11/20 1211  GLUCAP 157* 184* 174* 131* 165*   Lipid Profile: No results for input(s): CHOL, HDL, LDLCALC, TRIG, CHOLHDL, LDLDIRECT in the last 72 hours. Thyroid Function Tests: No results for input(s): TSH, T4TOTAL, FREET4, T3FREE, THYROIDAB in the last 72 hours. Anemia Panel: No results for input(s): VITAMINB12, FOLATE, FERRITIN, TIBC, IRON, RETICCTPCT in the last 72 hours. Sepsis Labs: Recent Labs  Lab 02/04/20 1819 02/04/20 2000 02/05/20 0048 02/05/20 0450 02/10/20 0306  PROCALCITON  --   --   --   --  2.12  LATICACIDVEN 3.6* 4.3* 3.1* 2.4*  --     Recent Results (from the past 240 hour(s))  Culture, blood (Routine x 2)  Status: None   Collection Time: 02/04/20  1:25 PM   Specimen: BLOOD RIGHT ARM  Result Value Ref Range Status   Specimen Description  BLOOD RIGHT ARM  Final   Special Requests   Final    BOTTLES DRAWN AEROBIC AND ANAEROBIC Blood Culture adequate volume   Culture   Final    NO GROWTH 5 DAYS Performed at [facility], [address], [place], [place] [address]    Report Status 02/09/2020 FINAL  Final  Urine culture     Status: None   Collection Time: 02/04/20  1:32 PM   Specimen: In/Out Cath Urine  Result Value Ref Range Status   Specimen Description IN/OUT CATH URINE  Final   Special Requests NONE  Final   Culture   Final    NO GROWTH Performed at [facility], [place] [address] [place], [place] [address]    Report Status 02/06/2020 FINAL  Final  Culture, blood (Routine x 2)     Status: None   Collection Time: 02/04/20  2:00 PM   Specimen: BLOOD  Result Value Ref Range Status   Specimen Description BLOOD RIGHT ANTECUBITAL  Final   Special Requests   Final    BOTTLES DRAWN AEROBIC AND ANAEROBIC Blood Culture results may not be optimal due to an excessive volume of blood received in culture bottles   Culture   Final    NO GROWTH 5 DAYS Performed at [facility], [place] [address] [place], [place] [address]    Report Status 02/09/2020 FINAL  Final  Respiratory Panel by RT PCR (Flu A&B, Covid) - Nasopharyngeal Swab     Status: None   Collection Time: 02/04/20  3:33 PM   Specimen: Nasopharyngeal Swab  Result Value Ref Range Status   SARS Coronavirus 2 by RT PCR NEGATIVE NEGATIVE Final    Comment: (NOTE) SARS-CoV-2 target nucleic acids are NOT DETECTED. The SARS-CoV-2 RNA is generally detectable in upper respiratoy specimens during the acute phase of infection. The lowest concentration of SARS-CoV-2 viral copies this assay can detect is 131 copies/mL. A negative result does not preclude SARS-Cov-2 infection and should not be used as the sole basis for treatment or other patient management decisions. A negative result may occur with  improper specimen collection/handling, submission of specimen  other than nasopharyngeal swab, presence of viral mutation(s) within the areas targeted by this assay, and inadequate number of viral copies (<131 copies/mL). A negative result must be combined with clinical observations, patient history, and epidemiological information. The expected result is Negative. Fact Sheet for Patients:  PinkCheek.be Fact Sheet for Healthcare Providers:  GravelBags.it This test is not yet ap proved or cleared by the [place] FDA and  has been authorized for detection and/or diagnosis of SARS-CoV-2 by FDA under an Emergency Use Authorization (EUA). This EUA will remain  in effect (meaning this test can be used) for the duration of the COVID-19 declaration under Section 564(b)(1) of the Act, 21 U.S.C. section 360bbb-3(b)(1), unless the authorization is terminated or revoked sooner.    Influenza A by PCR NEGATIVE NEGATIVE Final   Influenza B by PCR NEGATIVE NEGATIVE Final    Comment: (NOTE) The Xpert Xpress SARS-CoV-2/FLU/RSV assay is intended as an aid in  the diagnosis of influenza from Nasopharyngeal swab specimens and  should not be used as a sole basis for treatment. Nasal washings and  aspirates are unacceptable for Xpert Xpress SARS-CoV-2/FLU/RSV  testing. Fact Sheet for Patients: PinkCheek.be Fact  Sheet for Healthcare Providers: GravelBags.it This test is not yet approved or cleared by the [place] and  has been authorized for detection and/or diagnosis of SARS-CoV-2 by  FDA under an Emergency Use Authorization (EUA). This EUA will remain  in effect (meaning this test can be used) for the duration of the  Covid-19 declaration under Section 564(b)(1) of the Act, 21  U.S.C. section 360bbb-3(b)(1), unless the authorization is  terminated or revoked. Performed at [facility], [place] [address] [place], [place] [address]     MRSA PCR Screening     Status: None   Collection Time: 02/04/20 11:47 PM   Specimen: Nasal Mucosa; Nasopharyngeal  Result Value Ref Range Status   MRSA by PCR NEGATIVE NEGATIVE Final    Comment:        The GeneXpert MRSA Assay (FDA approved for NASAL specimens only), is one component of a comprehensive MRSA colonization surveillance program. It is not intended to diagnose MRSA infection nor to guide or monitor treatment for MRSA infections. Performed at [facility], [place] [address] [place], [place] [address]   SARS CORONAVIRUS 2 (TAT 6-24 HRS) Nasopharyngeal Nasopharyngeal Swab     Status: None   Collection Time: 02/08/20  2:09 PM   Specimen: Nasopharyngeal Swab  Result Value Ref Range Status   SARS Coronavirus 2 NEGATIVE NEGATIVE Final    Comment: (NOTE) SARS-CoV-2 target nucleic acids are NOT DETECTED. The SARS-CoV-2 RNA is generally detectable in upper and lower respiratory specimens during the acute phase of infection. Negative results do not preclude SARS-CoV-2 infection, do not rule out co-infections with other pathogens, and should not be used as the sole basis for treatment or other patient management decisions. Negative results must be combined with clinical observations, patient history, and epidemiological information. The expected result is Negative. Fact Sheet for Patients: SugarRoll.be Fact Sheet for Healthcare Providers: https://www.woods-mathews.com/ This test is not yet approved or cleared by the [place] FDA and  has been authorized for detection and/or diagnosis of SARS-CoV-2 by FDA under an Emergency Use Authorization (EUA). This EUA will remain  in effect (meaning this test can be used) for the duration of the COVID-19 declaration under Section 56 4(b)(1) of the Act, 21 U.S.C. section 360bbb-3(b)(1), unless the authorization is terminated or revoked sooner. Performed at [facility], [place] [address] [place], [place] [address]      Radiology Studies: DG CHEST PORT 1 VIEW  Result Date: 02/10/2020 CLINICAL DATA:  Cough, congestive heart failure. EXAM: PORTABLE CHEST 1 VIEW COMPARISON:  Feb 08, 2020. FINDINGS: Stable cardiomegaly. Status post coronary artery bypass graft and aortic valve repair. Atherosclerosis thoracic aorta is noted. No pneumothorax is noted. Stable bilateral lung opacities are noted most consistent with pulmonary edema. Small bilateral pleural effusions are noted. Bony thorax is unremarkable. IMPRESSION: Aortic atherosclerosis. Stable bilateral lung opacities are noted most consistent with pulmonary edema. Small bilateral pleural effusions are noted. Aortic Atherosclerosis (ICD10-I70.0). Electronically Signed   By: Marijo Conception M.D.   On: 02/10/2020 08:11    Scheduled Meds: . apixaban  2.5 mg Oral BID  . feeding supplement (ENSURE ENLIVE)  237 mL Oral BID BM  . levothyroxine  100 mcg Oral Q0600  . metoprolol succinate  25 mg Oral Daily  . multivitamin with minerals  1 tablet Oral Daily  . pantoprazole  40 mg Oral Daily   Continuous Infusions: . sodium chloride Stopped (02/06/20 0117)  . piperacillin-tazobactam (ZOSYN)  IV  3.375 g (02/11/20 1405)     LOS: 7 days   Marylu Lund, MD [facility] Pager On Amion  If 7PM-7AM, please contact night-coverage 02/11/2020, 3:05 PM

## 2020-02-11 NOTE — Plan of Care (Signed)

## 2020-02-12 ENCOUNTER — Inpatient Hospital Stay (HOSPITAL_COMMUNITY): Payer: Medicare Other

## 2020-02-12 LAB — CBC WITH DIFFERENTIAL/PLATELET
Abs Immature Granulocytes: 0.3 10*3/uL — ABNORMAL HIGH (ref 0.00–0.07)
Basophils Absolute: 0.1 10*3/uL (ref 0.0–0.1)
Basophils Relative: 0 %
Eosinophils Absolute: 0 10*3/uL (ref 0.0–0.5)
Eosinophils Relative: 0 %
HCT: 27.3 % — ABNORMAL LOW (ref 39.0–52.0)
Hemoglobin: 8.9 g/dL — ABNORMAL LOW (ref 13.0–17.0)
Immature Granulocytes: 1 %
Lymphocytes Relative: 6 %
Lymphs Abs: 1.3 10*3/uL (ref 0.7–4.0)
MCH: 28.7 pg (ref 26.0–34.0)
MCHC: 32.6 g/dL (ref 30.0–36.0)
MCV: 88.1 fL (ref 80.0–100.0)
Monocytes Absolute: 1.5 10*3/uL — ABNORMAL HIGH (ref 0.1–1.0)
Monocytes Relative: 7 %
Neutro Abs: 18.5 10*3/uL — ABNORMAL HIGH (ref 1.7–7.7)
Neutrophils Relative %: 86 %
Platelets: 254 10*3/uL (ref 150–400)
RBC: 3.1 MIL/uL — ABNORMAL LOW (ref 4.22–5.81)
RDW: 14.1 % (ref 11.5–15.5)
WBC: 21.6 10*3/uL — ABNORMAL HIGH (ref 4.0–10.5)
nRBC: 0 % (ref 0.0–0.2)

## 2020-02-12 LAB — COMPREHENSIVE METABOLIC PANEL
ALT: 11 U/L (ref 0–44)
AST: 14 U/L — ABNORMAL LOW (ref 15–41)
Albumin: 1.7 g/dL — ABNORMAL LOW (ref 3.5–5.0)
Alkaline Phosphatase: 53 U/L (ref 38–126)
Anion gap: 13 (ref 5–15)
BUN: 26 mg/dL — ABNORMAL HIGH (ref 8–23)
CO2: 27 mmol/L (ref 22–32)
Calcium: 9.2 mg/dL (ref 8.9–10.3)
Chloride: 92 mmol/L — ABNORMAL LOW (ref 98–111)
Creatinine, Ser: 1.52 mg/dL — ABNORMAL HIGH (ref 0.61–1.24)
GFR calc Af Amer: 45 mL/min — ABNORMAL LOW (ref >60)
GFR calc non Af Amer: 39 mL/min — ABNORMAL LOW (ref >60)
Glucose, Bld: 120 mg/dL — ABNORMAL HIGH (ref 70–99)
Potassium: 3.8 mmol/L (ref 3.5–5.1)
Sodium: 132 mmol/L — ABNORMAL LOW (ref 135–145)
Total Bilirubin: 0.8 mg/dL (ref 0.3–1.2)
Total Protein: 6.1 g/dL — ABNORMAL LOW (ref 6.5–8.1)

## 2020-02-12 LAB — GLUCOSE, CAPILLARY
Glucose-Capillary: 109 mg/dL — ABNORMAL HIGH (ref 70–99)
Glucose-Capillary: 144 mg/dL — ABNORMAL HIGH (ref 70–99)
Glucose-Capillary: 176 mg/dL — ABNORMAL HIGH (ref 70–99)
Glucose-Capillary: 145 mg/dL — ABNORMAL HIGH (ref 70–99)

## 2020-02-12 LAB — STREP PNEUMONIAE URINARY ANTIGEN: Strep Pneumo Urinary Antigen: POSITIVE — AB

## 2020-02-12 MED ORDER — SODIUM CHLORIDE 0.9 % IV SOLN
500.0000 mg | INTRAVENOUS | Status: DC
  Administered 2020-02-12: 500 mg via INTRAVENOUS
  Filled 2020-02-12 (×2): qty 500

## 2020-02-12 MED ORDER — SODIUM CHLORIDE 0.9 % IV SOLN
2.0000 g | INTRAVENOUS | Status: DC
  Administered 2020-02-12 – 2020-02-13 (×2): 2 g via INTRAVENOUS
  Filled 2020-02-12 (×2): qty 2

## 2020-02-12 NOTE — TOC Progression Note (Signed)
Transition of Care [facility]) - Progression Note    Patient Details  Name: [name] MRN: [id number] Date of Birth: [birth year]  [facility] CM/SW [place], [place] Phone Number: 02/12/2020, 9:11 AM  Clinical Narrative:     CSW aware patient is not medically ready for discharge. Patient will need another covid closer to discharge.  Patient has bed at [facility] when medically ready for discharge. Patient will need another covid.  TOC team will continue to follow.   Expected Discharge Plan: [place] Barriers to Discharge: Continued Medical Work up  Expected Discharge Plan and Services Expected Discharge Plan: [place] In-house Referral: Clinical Social Work Discharge Planning Services: CM Consult, Follow-up appt scheduled Post Acute Care Choice: [place], [place] Living arrangements for the past 2 months: Single Family Home                                       Social Determinants of Health (SDOH) Interventions    Readmission Risk Interventions Readmission Risk Prevention Plan 02/07/2020  Transportation Screening Complete  PCP or Specialist Appt within 5-7 Days Complete  Home Care Screening Complete  Medication Review (RN CM) Referral to Pharmacy  Some recent data might be hidden

## 2020-02-12 NOTE — Plan of Care (Signed)

## 2020-02-12 NOTE — Progress Notes (Signed)
PROGRESS NOTE    [name]  [id number] DOB: 07/07/1924 DOA: 02/04/2020 PCP: Mayra Neer, MD    Brief Narrative:  86 y.o. male with a history of CAD s/p CABG and AS AVR 2013, AFib on eliquis, HTN, and hypothyroidism who presented on 5/1 with SBO versus ileus and suspected aspiration pneumonia with septic shock requiring pressor support, AKI, and AFib with RVR.   In the [id number] to 102.8, tachypneic and tacycardic. BP's low normal. Patient requring 10 L HFNC. Labs remarkable for: K 3.1, Cr 2.24. Lactate 2.3>2.8>3.6>4.3. WBC 10.2, INR 18.3, Mag 2.2, PTT 38. GI consulted for possible SBP and will follow. NGT placed. Cefepime, Vanc, Flagyl for possible PNA. Zofran given. S/p 3L IVF. [id number] improved aeration compared to the plain film of 06/22/2019, there is evidence of acute CHF and bilateral small pleuraleffusions. CT abd/pelvis showed: Multiple dilated jejunal loops with somewhat decompressed colon. No transition point identified. Small amount of free fluid over the right pericolic gutter. Findings may be due to early/partial small bowel obstruction versus ileus. 2. Moderate airspace opacification over the mid to lower right lung and left base likely due to infection with associated basilar atelectasis. Moderate size left effusion with enhancing pleura suggesting empyema.  He was admitted to the ICU, NG tube placed, surgery consulted. Hemodynamics have improved, weaned from pressors, antibiotics narrowed to zosyn. SBO since resolved.  Assessment & Plan:   Principal Problem:   SBO (small bowel obstruction) (HCC) Active Problems:   S/P aortic valve replacement with bioprosthetic valve   S/P CABG (coronary artery bypass graft)   Atrial fibrillation (HCC)   Essential hypertension   Hypothyroidism   Aspiration pneumonia (HCC)   Sepsis (HCC)   AKI (acute kidney injury) ([place])   CKD (chronic kidney disease) stage 2, GFR 60-89 ml/min   Septic shock (HCC)   Community  acquired pneumonia of right lung   Pressure injury of skin  Septic shock and acute hypoxic respiratory failure due to aspiration pneumonia with left pleural effusion, POA: Shock resolved.  - Continued on zosyn.  - Continue supplemental oxygen as needed to maintain normal respiratory effort and SpO2 >90%.  - Sputum culture still pending, blood cultures NGTD - Wean O2 as tolerated, weaned to 1L today, cont to wean as tolerated - given concerns of aspiration, have consulted SLP. Pt cleared for soft diet - Labs reviewed, WBC has been trending up over the past several days, currently up to 21k. UA clear -Will check blood cx neg -Completed 8 days of zosyn on 5/8 -reviewed prior CT abd from 5/1. Findings concerning for possible empyema on that study. Have since ordered and reviewed dedicated CT chest. Findings notable for moderate bilateral effusions and infiltrative process -Have started azithromycin and rocephin.  -Have requested US guided thoracentesis with fluid analysis pending  Partial SBO vs. ileus: Improved with contrast to rectum on AXR recently - DC'd NGT, advanced diet as tolerated. Surgery has signed off. -Pt moving bowels. Abd nondistended   Hypokalemia:  -normalized -Repeat bmet in AM  Acute on chronic HFpEF:  -Following sepsis-protocol IV fluids. Pt did receive multiple boluses in addition to fluids in IV meds. Noted to have pulmonary edema on XR -Recent CXR with worse findings suggestive of worsening edema - Echocardiogram shows normal LV systolic function, LVH, nl RV, nl IVC. -Given recent CXR findings, have been giving IV lasix.  -Good urine output with lasix -Sodium trending down, thus suspecting pt has reached limit for diuresis at this time. Held further lasix -  Repeat bmet in AM  Hypoglycemia:  - Continue monitoring with onset of diet.  CAD s/p CABG: No chest pain this AM.  - Continue anticoagulation, pt not on statin, - BP improved, currently tolerating beta  blocker thus far  Chronic atrial fibrillation: Initially with RVR -On eliquis -Hr had recently been in the 100's over the past several days with improvement in bp -tolerating home metoprolol for rate control  AKI on stage IIIa CKD: -Overall improved, not back to baseline (~1.1) -Good diuresis with IV lasix over the past several days -pt now off lasix -Repeat bmet in AM  Hypothyroidism: TSH 2.3 - Continue synthroid as tolerated  DVT prophylaxis: Eliquis Code Status: DNR Family Communication: Pt in room, had called and updated pateint's daughter over phone 5/9  Status is: Inpatient  Remains inpatient appropriate because:Unsafe d/c plan and IV treatments appropriate due to intensity of illness or inability to take PO   Dispo: The patient is from: Home              Anticipated d/c is to: SNF              Anticipated d/c date is: 3 days              Patient currently is not medically stable to d/c.  Consultants:   General Surgery  Critical Care  Procedures:     Antimicrobials: Anti-infectives (From admission, onward)   Start     Dose/Rate Route Frequency Ordered Stop   02/12/20 1430  azithromycin (ZITHROMAX) 500 mg in sodium chloride 0.9 % 250 mL IVPB     500 mg 250 mL/hr over 60 Minutes Intravenous Every 24 hours 02/12/20 1329 02/17/20 1429   02/12/20 1345  cefTRIAXone (ROCEPHIN) 2 g in sodium chloride 0.9 % 100 mL IVPB     2 g 200 mL/hr over 30 Minutes Intravenous Every 24 hours 02/12/20 1329 02/17/20 1344   02/06/20 1600  vancomycin (VANCOCIN) IVPB 1000 mg/200 mL premix  Status:  Discontinued     1,000 mg 200 mL/hr over 60 Minutes Intravenous Every 48 hours 02/04/20 1517 02/05/20 1124   02/05/20 1600  ceFEPIme (MAXIPIME) 2 g in sodium chloride 0.9 % 100 mL IVPB  Status:  Discontinued     2 g 200 mL/hr over 30 Minutes Intravenous Every 24 hours 02/04/20 1517 02/05/20 1124   02/05/20 1500  piperacillin-tazobactam (ZOSYN) IVPB 3.375 g     3.375 g 12.5 mL/hr  over 240 Minutes Intravenous Every 8 hours 02/05/20 1124 02/11/20 2359   02/04/20 2100  metroNIDAZOLE (FLAGYL) IVPB 500 mg  Status:  Discontinued     500 mg 100 mL/hr over 60 Minutes Intravenous Every 8 hours 02/04/20 2058 02/05/20 1124   02/04/20 1345  ceFEPIme (MAXIPIME) 2 g in sodium chloride 0.9 % 100 mL IVPB     2 g 200 mL/hr over 30 Minutes Intravenous  Once 02/04/20 1334 02/04/20 1611   02/04/20 1345  metroNIDAZOLE (FLAGYL) IVPB 500 mg     500 mg 100 mL/hr over 60 Minutes Intravenous  Once 02/04/20 1334 02/04/20 1507   02/04/20 1345  vancomycin (VANCOCIN) IVPB 1000 mg/200 mL premix     1,000 mg 200 mL/hr over 60 Minutes Intravenous  Once 02/04/20 1334 02/04/20 1503      Subjective: No complaints this AM  Objective: Vitals:   02/11/20 0507 02/11/20 1429 02/11/20 2025 02/12/20 0450  BP: (!) 112/53 108/65 (!) 153/66 (!) 107/51  Pulse: 84 84 90 88  Resp: 20  19  20  Temp: 99.1 F (37.3 C) 98.8 F (37.1 C) 97.6 F (36.4 C) 98 F (36.7 C)  TempSrc: Oral Oral Oral Oral  SpO2: 93% 98% 92% 94%  Weight:      Height:        Intake/Output Summary (Last 24 hours) at 02/12/2020 1348 Last data filed at 02/12/2020 1308 Gross per 24 hour  Intake 650 ml  Output 2400 ml  Net -1750 ml   Filed Weights   02/09/20 0500 02/10/20 0500 02/10/20 1802  Weight: 70 kg 83 kg 71.3 kg    Examination: General exam: Conversant, in no acute distress Respiratory system: normal chest rise, clear, no audible wheezing Cardiovascular system: regular rhythm, s1-s2 Gastrointestinal system: Nondistended, nontender, pos BS Central nervous system: No seizures, no tremors Extremities: No cyanosis, no joint deformities Skin: No rashes, no pallor Psychiatry: Affect normal // no auditory hallucinations   Data Reviewed: I have personally reviewed following labs and imaging studies  CBC: Recent Labs  Lab 02/07/20 0411 02/09/20 0230 02/10/20 0306 02/11/20 0346 02/12/20 0304  WBC 12.1* 13.8* 16.7*  18.6*  18.6* 21.6*  NEUTROABS  --   --   --  15.4* 18.5*  HGB 9.5* 8.9* 9.3* 9.5*  9.5* 8.9*  HCT 29.9* 28.2* 28.5* 29.2*  29.2* 27.3*  MCV 93.4 92.2 89.6 90.4  89.3 88.1  PLT 176 183 190 223  219 0000000   Basic Metabolic Panel: Recent Labs  Lab 02/08/20 0403 02/09/20 0230 02/10/20 0306 02/11/20 0346 02/12/20 0304  NA 137 133* 131* 129* 132*  K 4.1 3.9 3.7 3.3* 3.8  CL 103 98 97* 92* 92*  CO2 25 26 26 26 27   GLUCOSE 106* 160* 118* 131* 120*  BUN 44* 38* 31* 26* 26*  CREATININE 1.34* 1.32* 1.40* 1.45* 1.52*  CALCIUM 9.5 9.4 9.0 8.9 9.2   GFR: Estimated Creatinine Clearance: 29.4 mL/min (A) (by C-G formula based on SCr of 1.52 mg/dL (H)). Liver Function Tests: Recent Labs  Lab 02/09/20 0230 02/10/20 0306 02/11/20 0346 02/12/20 0304  AST 18 15 16  14*  ALT 15 13 10 11   ALKPHOS 54 51 62 53  BILITOT 0.7 1.0 1.3* 0.8  PROT 6.1* 6.0* 6.2* 6.1*  ALBUMIN 2.1* 1.9* 1.9* 1.7*   No results for input(s): LIPASE, AMYLASE in the last 168 hours. No results for input(s): AMMONIA in the last 168 hours. Coagulation Profile: No results for input(s): INR, PROTIME in the last 168 hours. Cardiac Enzymes: No results for input(s): CKTOTAL, CKMB, CKMBINDEX, TROPONINI in the last 168 hours. BNP (last 3 results) Recent Labs    07/25/19 1033 08/10/19 1411 01/20/20 1217  PROBNP 3,330* 6,202* 2,330*   HbA1C: No results for input(s): HGBA1C in the last 72 hours. CBG: Recent Labs  Lab 02/11/20 1211 02/11/20 1709 02/11/20 2046 02/12/20 0821 02/12/20 1233  GLUCAP 165* 129* 157* 109* 144*   Lipid Profile: No results for input(s): CHOL, HDL, LDLCALC, TRIG, CHOLHDL, LDLDIRECT in the last 72 hours. Thyroid Function Tests: No results for input(s): TSH, T4TOTAL, FREET4, T3FREE, THYROIDAB in the last 72 hours. Anemia Panel: No results for input(s): VITAMINB12, FOLATE, FERRITIN, TIBC, IRON, RETICCTPCT in the last 72 hours. Sepsis Labs: Recent Labs  Lab 02/10/20 0306  PROCALCITON  2.12    Recent Results (from the past 240 hour(s))  Culture, blood (Routine x 2)     Status: None   Collection Time: 02/04/20  1:25 PM   Specimen: BLOOD RIGHT ARM  Result Value Ref Range Status  Specimen Description BLOOD RIGHT ARM  Final   Special Requests   Final    BOTTLES DRAWN AEROBIC AND ANAEROBIC Blood Culture adequate volume   Culture   Final    NO GROWTH 5 DAYS Performed at [facility], [address], [place], [place] [address]    Report Status 02/09/2020 FINAL  Final  Urine culture     Status: None   Collection Time: 02/04/20  1:32 PM   Specimen: In/Out Cath Urine  Result Value Ref Range Status   Specimen Description IN/OUT CATH URINE  Final   Special Requests NONE  Final   Culture   Final    NO GROWTH Performed at [facility], [place] [address], [place], [place] [address]    Report Status 02/06/2020 FINAL  Final  Culture, blood (Routine x 2)     Status: None   Collection Time: 02/04/20  2:00 PM   Specimen: BLOOD  Result Value Ref Range Status   Specimen Description BLOOD RIGHT ANTECUBITAL  Final   Special Requests   Final    BOTTLES DRAWN AEROBIC AND ANAEROBIC Blood Culture results may not be optimal due to an excessive volume of blood received in culture bottles   Culture   Final    NO GROWTH 5 DAYS Performed at [facility], [place] [address], [place], [place] [address]    Report Status 02/09/2020 FINAL  Final  Respiratory Panel by RT PCR (Flu A&B, Covid) - Nasopharyngeal Swab     Status: None   Collection Time: 02/04/20  3:33 PM   Specimen: Nasopharyngeal Swab  Result Value Ref Range Status   SARS Coronavirus 2 by RT PCR NEGATIVE NEGATIVE Final    Comment: (NOTE) SARS-CoV-2 target nucleic acids are NOT DETECTED. The SARS-CoV-2 RNA is generally detectable in upper respiratoy specimens during the acute phase of infection. The lowest concentration of SARS-CoV-2 viral copies this assay can detect is 131 copies/mL. A negative result does not  preclude SARS-Cov-2 infection and should not be used as the sole basis for treatment or other patient management decisions. A negative result may occur with  improper specimen collection/handling, submission of specimen other than nasopharyngeal swab, presence of viral mutation(s) within the areas targeted by this assay, and inadequate number of viral copies (<131 copies/mL). A negative result must be combined with clinical observations, patient history, and epidemiological information. The expected result is Negative. Fact Sheet for Patients:  PinkCheek.be Fact Sheet for Healthcare Providers:  GravelBags.it This test is not yet ap proved or cleared by the [place] FDA and  has been authorized for detection and/or diagnosis of SARS-CoV-2 by FDA under an Emergency Use Authorization (EUA). This EUA will remain  in effect (meaning this test can be used) for the duration of the COVID-19 declaration under Section 564(b)(1) of the Act, 21 U.S.C. section 360bbb-3(b)(1), unless the authorization is terminated or revoked sooner.    Influenza A by PCR NEGATIVE NEGATIVE Final   Influenza B by PCR NEGATIVE NEGATIVE Final    Comment: (NOTE) The Xpert Xpress SARS-CoV-2/FLU/RSV assay is intended as an aid in  the diagnosis of influenza from Nasopharyngeal swab specimens and  should not be used as a sole basis for treatment. Nasal washings and  aspirates are unacceptable for Xpert Xpress SARS-CoV-2/FLU/RSV  testing. Fact Sheet for Patients: PinkCheek.be Fact Sheet for Healthcare Providers: GravelBags.it This test is not yet approved or cleared by the [place] FDA and  has been authorized for detection  and/or diagnosis of SARS-CoV-2 by  FDA under an Emergency Use Authorization (EUA). This EUA will remain  in effect (meaning this test can be used) for the duration of the    Covid-19 declaration under Section 564(b)(1) of the Act, 21  U.S.C. section 360bbb-3(b)(1), unless the authorization is  terminated or revoked. Performed at [facility], [place] [address], [place], [place] [address]   MRSA PCR Screening     Status: None   Collection Time: 02/04/20 11:47 PM   Specimen: Nasal Mucosa; Nasopharyngeal  Result Value Ref Range Status   MRSA by PCR NEGATIVE NEGATIVE Final    Comment:        The GeneXpert MRSA Assay (FDA approved for NASAL specimens only), is one component of a comprehensive MRSA colonization surveillance program. It is not intended to diagnose MRSA infection nor to guide or monitor treatment for MRSA infections. Performed at [facility], [place] [address] [place], [place] [address]   SARS CORONAVIRUS 2 (TAT 6-24 HRS) Nasopharyngeal Nasopharyngeal Swab     Status: None   Collection Time: 02/08/20  2:09 PM   Specimen: Nasopharyngeal Swab  Result Value Ref Range Status   SARS Coronavirus 2 NEGATIVE NEGATIVE Final    Comment: (NOTE) SARS-CoV-2 target nucleic acids are NOT DETECTED. The SARS-CoV-2 RNA is generally detectable in upper and lower respiratory specimens during the acute phase of infection. Negative results do not preclude SARS-CoV-2 infection, do not rule out co-infections with other pathogens, and should not be used as the sole basis for treatment or other patient management decisions. Negative results must be combined with clinical observations, patient history, and epidemiological information. The expected result is Negative. Fact Sheet for Patients: SugarRoll.be Fact Sheet for Healthcare Providers: https://www.woods-mathews.com/ This test is not yet approved or cleared by the [place] FDA and  has been authorized for detection and/or diagnosis of SARS-CoV-2 by FDA under an Emergency Use Authorization (EUA). This EUA will remain  in effect (meaning this test can be  used) for the duration of the COVID-19 declaration under Section 56 4(b)(1) of the Act, 21 U.S.C. section 360bbb-3(b)(1), unless the authorization is terminated or revoked sooner. Performed at [facility], [place] [address] [place], [place] [address]   Culture, blood (routine x 2)     Status: None (Preliminary result)   Collection Time: 02/11/20  3:59 PM   Specimen: BLOOD LEFT ARM  Result Value Ref Range Status   Specimen Description BLOOD LEFT ARM  Final   Special Requests   Final    BOTTLES DRAWN AEROBIC AND ANAEROBIC Blood Culture adequate volume   Culture   Final    NO GROWTH < 24 HOURS Performed at [facility], [place] [address] [place], [place] [address]    Report Status PENDING  Incomplete  Culture, blood (routine x 2)     Status: None (Preliminary result)   Collection Time: 02/11/20  4:13 PM   Specimen: BLOOD LEFT HAND  Result Value Ref Range Status   Specimen Description BLOOD LEFT HAND  Final   Special Requests AEROBIC BOTTLE ONLY Blood Culture adequate volume  Final   Culture   Final    NO GROWTH < 24 HOURS Performed at [facility], [place] [address], [place],  [address]    Report Status PENDING  Incomplete     Radiology Studies: CT CHEST WO CONTRAST  Result Date: 02/12/2020 CLINICAL DATA:  Pneumonia. EXAM: CT CHEST WITHOUT CONTRAST TECHNIQUE: Multidetector  CT imaging of the chest was performed following the standard protocol without IV contrast. COMPARISON:  Chest radiograph Feb 10, 2020 FINDINGS: Cardiovascular: Enlarged heart. Heavy calcific atherosclerotic disease of the coronary arteries. Tortuosity and calcific atherosclerotic disease of the aorta. Post CABG postsurgical changes. No pericardial effusion. Mediastinum/Nodes: Mild mediastinal lymphadenopathy with the largest pretracheal lymph node measuring 1.7 cm. Lungs/Pleura: Moderate in size bilateral pleural effusions. Bilateral lower lobe confluent airspace consolidation. Additional patchy  alveolar and interstitial opacities with central and dependent predominance. Upper Abdomen: No acute abnormality. Musculoskeletal: No chest wall mass or suspicious bone lesions identified. IMPRESSION: 1. Moderate in size bilateral pleural effusions. 2. Bilateral lower lobe confluent airspace consolidation may represent multifocal pneumonia versus segmental atelectasis. Additional patchy alveolar and interstitial opacities with central and dependent predominance likely represents superimposed mixed pattern pulmonary edema. 3. Mild mediastinal lymphadenopathy, likely reactive. 4. Enlarged heart. 5. Heavy calcific atherosclerotic disease of the coronary arteries. Aortic Atherosclerosis (ICD10-I70.0). Electronically Signed   By: Fidela Salisbury M.D.   On: 02/12/2020 11:50    Scheduled Meds: . apixaban  2.5 mg Oral BID  . feeding supplement (ENSURE ENLIVE)  237 mL Oral BID BM  . levothyroxine  100 mcg Oral Q0600  . metoprolol succinate  25 mg Oral Daily  . multivitamin with minerals  1 tablet Oral Daily  . pantoprazole  40 mg Oral Daily   Continuous Infusions: . sodium chloride Stopped (02/06/20 0117)  . azithromycin    . cefTRIAXone (ROCEPHIN)  IV       LOS: 8 days   Marylu Lund, MD [facility] Pager On Amion  If 7PM-7AM, please contact night-coverage 02/12/2020, 1:48 PM

## 2020-02-13 ENCOUNTER — Inpatient Hospital Stay (HOSPITAL_COMMUNITY): Payer: Medicare Other

## 2020-02-13 HISTORY — PX: IR THORACENTESIS RIGHT ASP PLEURAL SPACE W/IMG GUIDE: IMG5380

## 2020-02-13 LAB — COMPREHENSIVE METABOLIC PANEL
ALT: 10 U/L (ref 0–44)
AST: 14 U/L — ABNORMAL LOW (ref 15–41)
Albumin: 1.8 g/dL — ABNORMAL LOW (ref 3.5–5.0)
Alkaline Phosphatase: 52 U/L (ref 38–126)
Anion gap: 9 (ref 5–15)
BUN: 26 mg/dL — ABNORMAL HIGH (ref 8–23)
CO2: 28 mmol/L (ref 22–32)
Calcium: 9.1 mg/dL (ref 8.9–10.3)
Chloride: 94 mmol/L — ABNORMAL LOW (ref 98–111)
Creatinine, Ser: 1.42 mg/dL — ABNORMAL HIGH (ref 0.61–1.24)
GFR calc Af Amer: 49 mL/min — ABNORMAL LOW (ref >60)
GFR calc non Af Amer: 42 mL/min — ABNORMAL LOW (ref >60)
Glucose, Bld: 127 mg/dL — ABNORMAL HIGH (ref 70–99)
Potassium: 3.6 mmol/L (ref 3.5–5.1)
Sodium: 131 mmol/L — ABNORMAL LOW (ref 135–145)
Total Bilirubin: 0.7 mg/dL (ref 0.3–1.2)
Total Protein: 6.2 g/dL — ABNORMAL LOW (ref 6.5–8.1)

## 2020-02-13 LAB — GLUCOSE, CAPILLARY
Glucose-Capillary: 110 mg/dL — ABNORMAL HIGH (ref 70–99)
Glucose-Capillary: 173 mg/dL — ABNORMAL HIGH (ref 70–99)
Glucose-Capillary: 145 mg/dL — ABNORMAL HIGH (ref 70–99)
Glucose-Capillary: 152 mg/dL — ABNORMAL HIGH (ref 70–99)

## 2020-02-13 LAB — CBC
HCT: 26 % — ABNORMAL LOW (ref 39.0–52.0)
Hemoglobin: 8.6 g/dL — ABNORMAL LOW (ref 13.0–17.0)
MCH: 29 pg (ref 26.0–34.0)
MCHC: 33.1 g/dL (ref 30.0–36.0)
MCV: 87.5 fL (ref 80.0–100.0)
Platelets: 287 10*3/uL (ref 150–400)
RBC: 2.97 MIL/uL — ABNORMAL LOW (ref 4.22–5.81)
RDW: 14 % (ref 11.5–15.5)
WBC: 15.8 10*3/uL — ABNORMAL HIGH (ref 4.0–10.5)
nRBC: 0 % (ref 0.0–0.2)

## 2020-02-13 LAB — BODY FLUID CELL COUNT WITH DIFFERENTIAL
Eos, Fluid: 0 %
Lymphs, Fluid: 41 %
Monocyte-Macrophage-Serous Fluid: 7 % — ABNORMAL LOW (ref 50–90)
Neutrophil Count, Fluid: 52 % — ABNORMAL HIGH (ref 0–25)
Total Nucleated Cell Count, Fluid: 845 cu mm (ref 0–1000)

## 2020-02-13 LAB — GRAM STAIN

## 2020-02-13 LAB — PROTEIN, PLEURAL OR PERITONEAL FLUID: Total protein, fluid: <3 g/dL

## 2020-02-13 LAB — LACTATE DEHYDROGENASE, PLEURAL OR PERITONEAL FLUID: LD, Fluid: 66 U/L — ABNORMAL HIGH (ref 3–23)

## 2020-02-13 LAB — LEGIONELLA PNEUMOPHILA SEROGP 1 UR AG: L. pneumophila Serogp 1 Ur Ag: NEGATIVE

## 2020-02-13 MED ORDER — LIDOCAINE HCL 1 % IJ SOLN
INTRAMUSCULAR | Status: AC
  Filled 2020-02-13: qty 20

## 2020-02-13 NOTE — TOC Progression Note (Signed)
Transition of Care [facility]) - Progression Note    Patient Details  Name: ISADOR FACEMIRE MRN: [id number] Date of Birth: [birth year]  [facility] CM/SW [place], [place] Phone Number: 02/13/2020, 11:31 AM  Clinical Narrative:    Pt not medically stable per Dr. Rodena Piety- following for stability to order new COVID.  [facility] following   Expected Discharge Plan: [place] Barriers to Discharge: Continued Medical Work up  Expected Discharge Plan and Services Expected Discharge Plan: [place] In-house Referral: Clinical Social Work Discharge Planning Services: CM Consult, Follow-up appt scheduled Post Acute Care Choice: [place], [place] Living arrangements for the past 2 months: Single Family Home  Readmission Risk Interventions Readmission Risk Prevention Plan 02/07/2020  Transportation Screening Complete  PCP or Specialist Appt within 5-7 Days Complete  Home Care Screening Complete  Medication Review (RN CM) Referral to Pharmacy  Some recent data might be hidden

## 2020-02-13 NOTE — Procedures (Signed)
PROCEDURE SUMMARY:  Successful US guided diagnostic and therapeutic right thoracentesis. Yielded 600 mL of yellow fluid. Pt tolerated procedure well. No immediate complications.  Specimen was sent for labs. CXR ordered.  EBL < 5 mL  Docia Barrier PA-C 02/13/2020 1:59 PM

## 2020-02-13 NOTE — Plan of Care (Signed)
  Problem: Safety: Goal: Ability to remain free from injury will improve Outcome: Progressing   

## 2020-02-13 NOTE — Progress Notes (Addendum)
Nutrition Follow-up  DOCUMENTATION CODES:   Not applicable  INTERVENTION:   -Continue Magic cup BID with meals, each supplement provides 290 kcal and 9 grams of protein -Continue Ensure Enlive po BID, each supplement provides 350 kcal and 20 grams of protein -Continue MVI with minerals daily  NUTRITION DIAGNOSIS:   Inadequate oral intake related to altered GI function as evidenced by meal completion < 50%.  Ongoing  GOAL:   Patient will meet greater than or equal to 90% of their needs  Progressing   MONITOR:   PO intake, Supplement acceptance, Labs, Weight trends, Skin, I & O's  REASON FOR ASSESSMENT:   Consult Assessment of nutrition requirement/status  ASSESSMENT:   86 y.o. male with PMH aortic stenosis, CAD, bioprosthetic aortic valve, atrial fibrillation/flutter on Eliquis, HTN and chronic left pleural effusion presented to ER with vomiting and admitted for SBO and likely aspiration PNA.  5/1- NGT placed 5/4- NGT d/c, advanced to full liquid diet, advanced to soft diet 5/7- s/p BSE- recommend continue with soft diet with thin liquids  Reviewed I/O's: -890 ml x 24 hours and +2.9 L since admission  UOP: 2.5 L x 24 hours  Pt unable to provide further history secondary to dementia.   Intake remains variable; documented PO's 25-75%. Pt is consuming Ensure supplements per MAR.   Per TOC team notes, plan to d/cto SNF ([facility]) once medically stable.   Albumin has a half-life of 21 days and is strongly affected by stress response and inflammatory process, therefore, do not expect to see an improvement in this lab value during acute hospitalization. When a patient presents with low albumin, it is likely skewed due to the acute inflammatory response.  Unless it is suspected that patient had poor PO intake or malnutrition prior to admission, then RD should not be consulted solely for low albumin. Note that low albumin is no longer used to diagnose malnutrition; [place]  uses the new malnutrition guidelines published by the American Society for Parenteral and Enteral Nutrition (A.S.P.E.N.) and the Academy of Nutrition and Dietetics (AND).    Labs reviewed: Na: 131, CBGS: 144-176.   Diet Order:   Diet Order            DIET SOFT Room service appropriate? Yes with Assist; Fluid consistency: Thin  Diet effective now              EDUCATION NEEDS:   Not appropriate for education at this time  Skin:  Skin Assessment: Skin Integrity Issues: Skin Integrity Issues:: Stage I Stage I: sacrum  Last BM:  02/12/20  Height:   Ht Readings from Last 1 Encounters:  02/04/20 5\' 8"  (1.727 m)    Weight:   Wt Readings from Last 1 Encounters:  02/13/20 71.6 kg    Ideal Body Weight:  70 kg  BMI:  Body mass index is 24 kg/m.  Estimated Nutritional Needs:   Kcal:  1800-2000  Protein:  90-105 grams  Fluid:  > 1.8 L    Loistine Chance, RD, LDN, [place] Registered Dietitian II Certified Diabetes Care and Education Specialist Please refer to [facility] for RD and/or RD on-call/weekend/after hours pager

## 2020-02-13 NOTE — Plan of Care (Signed)

## 2020-02-13 NOTE — Progress Notes (Signed)
PROGRESS NOTE    [name]  [id number] DOB: 02/03/1924 DOA: 02/04/2020 PCP: Mayra Neer, MD   Brief Narrative: 86 y.o.malewith a history of CAD s/p CABG and AS AVR 2013, AFib on eliquis, HTN, and hypothyroidism who presented on 5/1 with SBO versus ileus and suspected aspiration pneumonia with septic shock requiring pressor support, AKI, and AFib with RVR.   In the [id number] to 102.8, tachypneic and tacycardic. BP's low normal. Patient requring 10 L HFNC. Labs remarkable for: K 3.1, Cr 2.24. Lactate 2.3>2.8>3.6>4.3. WBC 10.2, INR 18.3, Mag 2.2, PTT 38. GI consulted for possible SBP and will follow. NGT placed. Cefepime, Vanc, Flagyl for possible PNA. Zofran given. S/p 3L IVF. [id number] improved aeration compared to the plain film of 06/22/2019, there is evidence of acute CHF and bilateral small pleuraleffusions.CT abd/pelvis showed: Multiple dilated jejunal loops with somewhat decompressed colon. No transition point identified. Small amount of free fluid over the right pericolic gutter. Findings may be due to early/partial small bowel obstruction versus ileus. 2. Moderate airspace opacification over the mid to lower right lung and left base likely due to infection with associated basilar atelectasis. Moderate size left effusion with enhancing pleura suggesting empyema.  He was admitted to the ICU, NG tube placed, surgery consulted. Hemodynamics have improved, weaned from pressors, antibiotics narrowed to zosyn. SBO since resolved.  5/10-patient is resting in bed he is on 1 L of oxygen saturating 97%.  CT of the chest done 02/12/2020 shows moderate bilateral pleural effusions. Assessment & Plan:   Principal Problem:   SBO (small bowel obstruction) (HCC) Active Problems:   S/P aortic valve replacement with bioprosthetic valve   S/P CABG (coronary artery bypass graft)   Atrial fibrillation (HCC)   Essential hypertension   Hypothyroidism   Aspiration pneumonia  (HCC)   Sepsis (HCC)   AKI (acute kidney injury) ([facility])   CKD (chronic kidney disease) stage 2, GFR 60-89 ml/min   Septic shock ([place])   Community acquired pneumonia of right lung   Pressure injury of skin   #1 status post septic shock present on admission secondary to aspiration pneumonia present on admission resolved.  Patient was treated with Zosyn for 8 days. His oxygen requirement has improved.  Currently he is on 1 L saturating 97% at rest. Patient was seen by speech therapy cleared for soft diet. WBC is trending down to 15.9 from UA clear blood cultures negative. CT chest 02/12/2020-Moderate in size bilateral pleural effusions. Bilateral lower lobe confluent airspace consolidation may represent multifocal pneumonia versus segmental atelectasis. Additional patchy alveolar and interstitial opacities with central and dependent predominance likely represents superimposed mixed pattern pulmonary edema.  Mild mediastinal lymphadenopathy, likely reactive.  Enlarged heart.  Heavy calcific atherosclerotic disease of the coronary arteries.  #2 partial SBO versus ileus has resolved patient is tolerating a regular diet and having bowel movements.  #3 acute on chronic diastolic heart failure-patient had received fluid for sepsis protocol followed by which he had pulmonary edema.  He was given Lasix.  Stable at this time.  #4 AKI on CKD stage IIIa baseline creatinine around 1.1.  #5 hypothyroidism continue Synthroid TSH 2.3  #6 hypoglycemia-resolved  #7 hypokalemia-resolved  #8 pressure injury medial sacral stage I present on admission  Pressure Injury 02/05/20 Sacrum Medial Stage 1 -  Intact skin with non-blanchable redness of a localized area usually over a bony prominence. (Active)  02/05/20 0000  Location: Sacrum  Location Orientation: Medial  Staging: Stage 1 -  Intact skin with non-blanchable  redness of a localized area usually over a bony prominence.  Wound Description  (Comments):   Present on Admission: Yes      Nutrition Problem: Inadequate oral intake Etiology: altered GI function     Signs/Symptoms: meal completion < 50%    Interventions: Ensure Enlive (each supplement provides 350kcal and 20 grams of protein), Magic cup, MVI  Estimated body mass index is 24 kg/m as calculated from the following:   Height as of this encounter: 5\' 8"  (1.727 m).   Weight as of this encounter: 71.6 kg.  DVT prophylaxis: Eliquis Code Status DNR Family Communication: Discussed with patient's daughter Disposition Plan:  Status is: Inpatient patient with bilateral pleural effusion IR consulted  Dispo: The patient is from: Home              Anticipated d/c is to: SNF              Anticipated d/c date is: Unknown              Patient currently not stable to be discharged    Consultants:   Interventional radiology PCCM and general surgery  Procedures: None Antimicrobials: None subjective:  Resting in bed denies any nausea vomiting shortness of breath or chest pain or cough. Objective: Vitals:   02/12/20 1604 02/12/20 2100 02/13/20 0409 02/13/20 0500  BP:  122/60 136/67   Pulse:  71 71   Resp:  14 17   Temp: 98.2 F (36.8 C) 98.8 F (37.1 C) 98.2 F (36.8 C)   TempSrc: Oral Oral Oral   SpO2:  97% 97%   Weight:    71.6 kg  Height:        Intake/Output Summary (Last 24 hours) at 02/13/2020 1052 Last data filed at 02/13/2020 0600 Gross per 24 hour  Intake 1240 ml  Output 2000 ml  Net -760 ml   Filed Weights   02/10/20 0500 02/10/20 1802 02/13/20 0500  Weight: 83 kg 71.3 kg 71.6 kg    Examination:  General exam: Appears calm and comfortable  Respiratory system: Decreased breath sounds at the bases to auscultation. Respiratory effort normal. Cardiovascular system: S1 & S2 heard, RRR. No JVD, murmurs, rubs, gallops or clicks. No pedal edema. Gastrointestinal system: Abdomen is nondistended, soft and nontender. No organomegaly or masses  felt. Normal bowel sounds heard. Central nervous system: Alert and oriented. No focal neurological deficits. Extremities: Symmetric 5 x 5 power. Skin: No rashes, lesions or ulcers Psychiatry: Judgement and insight appear normal. Mood & affect appropriate.     Data Reviewed: I have personally reviewed following labs and imaging studies  CBC: Recent Labs  Lab 02/09/20 0230 02/10/20 0306 02/11/20 0346 02/12/20 0304 02/13/20 0210  WBC 13.8* 16.7* 18.6*  18.6* 21.6* 15.8*  NEUTROABS  --   --  15.4* 18.5*  --   HGB 8.9* 9.3* 9.5*  9.5* 8.9* 8.6*  HCT 28.2* 28.5* 29.2*  29.2* 27.3* 26.0*  MCV 92.2 89.6 90.4  89.3 88.1 87.5  PLT 183 190 223  219 254 [id number]   Basic Metabolic Panel: Recent Labs  Lab 02/09/20 0230 02/10/20 0306 02/11/20 0346 02/12/20 0304 02/13/20 0210  NA 133* 131* 129* 132* 131*  K 3.9 3.7 3.3* 3.8 3.6  CL 98 97* 92* 92* 94*  CO2 26 26 26 27 28   GLUCOSE 160* 118* 131* 120* 127*  BUN 38* 31* 26* 26* 26*  CREATININE 1.32* 1.40* 1.45* 1.52* 1.42*  CALCIUM 9.4 9.0 8.9 9.2 9.1   GFR:  Estimated Creatinine Clearance: 31.4 mL/min (A) (by C-G formula based on SCr of 1.42 mg/dL (H)). Liver Function Tests: Recent Labs  Lab 02/09/20 0230 02/10/20 0306 02/11/20 0346 02/12/20 0304 02/13/20 0210  AST 18 15 16  14* 14*  ALT 15 13 10 11 10   ALKPHOS 54 51 62 53 52  BILITOT 0.7 1.0 1.3* 0.8 0.7  PROT 6.1* 6.0* 6.2* 6.1* 6.2*  ALBUMIN 2.1* 1.9* 1.9* 1.7* 1.8*   No results for input(s): LIPASE, AMYLASE in the last 168 hours. No results for input(s): AMMONIA in the last 168 hours. Coagulation Profile: No results for input(s): INR, PROTIME in the last 168 hours. Cardiac Enzymes: No results for input(s): CKTOTAL, CKMB, CKMBINDEX, TROPONINI in the last 168 hours. BNP (last 3 results) Recent Labs    07/25/19 1033 08/10/19 1411 01/20/20 1217  PROBNP 3,330* 6,202* 2,330*   HbA1C: No results for input(s): HGBA1C in the last 72 hours. CBG: Recent Labs  Lab  02/12/20 0821 02/12/20 1233 02/12/20 1704 02/12/20 2125 02/13/20 0810  GLUCAP 109* 144* 176* 145* 110*   Lipid Profile: No results for input(s): CHOL, HDL, LDLCALC, TRIG, CHOLHDL, LDLDIRECT in the last 72 hours. Thyroid Function Tests: No results for input(s): TSH, T4TOTAL, FREET4, T3FREE, THYROIDAB in the last 72 hours. Anemia Panel: No results for input(s): VITAMINB12, FOLATE, FERRITIN, TIBC, IRON, RETICCTPCT in the last 72 hours. Sepsis Labs: Recent Labs  Lab 02/10/20 0306  PROCALCITON 2.12    Recent Results (from the past 240 hour(s))  Culture, blood (Routine x 2)     Status: None   Collection Time: 02/04/20  1:25 PM   Specimen: BLOOD RIGHT ARM  Result Value Ref Range Status   Specimen Description BLOOD RIGHT ARM  Final   Special Requests   Final    BOTTLES DRAWN AEROBIC AND ANAEROBIC Blood Culture adequate volume   Culture   Final    NO GROWTH 5 DAYS Performed at [facility], [place] [address], [place], [place] [address]    Report Status 02/09/2020 FINAL  Final  Urine culture     Status: None   Collection Time: 02/04/20  1:32 PM   Specimen: In/Out Cath Urine  Result Value Ref Range Status   Specimen Description IN/OUT CATH URINE  Final   Special Requests NONE  Final   Culture   Final    NO GROWTH Performed at [facility], [place] [address], [place], [place] [address]    Report Status 02/06/2020 FINAL  Final  Culture, blood (Routine x 2)     Status: None   Collection Time: 02/04/20  2:00 PM   Specimen: BLOOD  Result Value Ref Range Status   Specimen Description BLOOD RIGHT ANTECUBITAL  Final   Special Requests   Final    BOTTLES DRAWN AEROBIC AND ANAEROBIC Blood Culture results may not be optimal due to an excessive volume of blood received in culture bottles   Culture   Final    NO GROWTH 5 DAYS Performed at [facility], [place] [address], [place], [place] [address]    Report Status 02/09/2020 FINAL  Final  Respiratory Panel by RT PCR (Flu  A&B, Covid) - Nasopharyngeal Swab     Status: None   Collection Time: 02/04/20  3:33 PM   Specimen: Nasopharyngeal Swab  Result Value Ref Range Status   SARS Coronavirus 2 by RT PCR NEGATIVE NEGATIVE Final    Comment: (NOTE) SARS-CoV-2 target nucleic acids are NOT DETECTED. The SARS-CoV-2 RNA  is generally detectable in upper respiratoy specimens during the acute phase of infection. The lowest concentration of SARS-CoV-2 viral copies this assay can detect is 131 copies/mL. A negative result does not preclude SARS-Cov-2 infection and should not be used as the sole basis for treatment or other patient management decisions. A negative result may occur with  improper specimen collection/handling, submission of specimen other than nasopharyngeal swab, presence of viral mutation(s) within the areas targeted by this assay, and inadequate number of viral copies (<131 copies/mL). A negative result must be combined with clinical observations, patient history, and epidemiological information. The expected result is Negative. Fact Sheet for Patients:  PinkCheek.be Fact Sheet for Healthcare Providers:  GravelBags.it This test is not yet ap proved or cleared by the [place] FDA and  has been authorized for detection and/or diagnosis of SARS-CoV-2 by FDA under an Emergency Use Authorization (EUA). This EUA will remain  in effect (meaning this test can be used) for the duration of the COVID-19 declaration under Section 564(b)(1) of the Act, 21 U.S.C. section 360bbb-3(b)(1), unless the authorization is terminated or revoked sooner.    Influenza A by PCR NEGATIVE NEGATIVE Final   Influenza B by PCR NEGATIVE NEGATIVE Final    Comment: (NOTE) The Xpert Xpress SARS-CoV-2/FLU/RSV assay is intended as an aid in  the diagnosis of influenza from Nasopharyngeal swab specimens and  should not be used as a sole basis for treatment. Nasal washings  and  aspirates are unacceptable for Xpert Xpress SARS-CoV-2/FLU/RSV  testing. Fact Sheet for Patients: PinkCheek.be Fact Sheet for Healthcare Providers: GravelBags.it This test is not yet approved or cleared by the [place] FDA and  has been authorized for detection and/or diagnosis of SARS-CoV-2 by  FDA under an Emergency Use Authorization (EUA). This EUA will remain  in effect (meaning this test can be used) for the duration of the  Covid-19 declaration under Section 564(b)(1) of the Act, 21  U.S.C. section 360bbb-3(b)(1), unless the authorization is  terminated or revoked. Performed at [facility], [place] [address] [place], [place] [address]   MRSA PCR Screening     Status: None   Collection Time: 02/04/20 11:47 PM   Specimen: Nasal Mucosa; Nasopharyngeal  Result Value Ref Range Status   MRSA by PCR NEGATIVE NEGATIVE Final    Comment:        The GeneXpert MRSA Assay (FDA approved for NASAL specimens only), is one component of a comprehensive MRSA colonization surveillance program. It is not intended to diagnose MRSA infection nor to guide or monitor treatment for MRSA infections. Performed at [facility], [place] [address] [place], [place] [address]   SARS CORONAVIRUS 2 (TAT 6-24 HRS) Nasopharyngeal Nasopharyngeal Swab     Status: None   Collection Time: 02/08/20  2:09 PM   Specimen: Nasopharyngeal Swab  Result Value Ref Range Status   SARS Coronavirus 2 NEGATIVE NEGATIVE Final    Comment: (NOTE) SARS-CoV-2 target nucleic acids are NOT DETECTED. The SARS-CoV-2 RNA is generally detectable in upper and lower respiratory specimens during the acute phase of infection. Negative results do not preclude SARS-CoV-2 infection, do not rule out co-infections with other pathogens, and should not be used as the sole basis for treatment or other patient management decisions. Negative results must be combined  with clinical observations, patient history, and epidemiological information. The expected result is Negative. Fact Sheet for Patients: SugarRoll.be Fact Sheet for Healthcare Providers: https://www.woods-.com/ This test is not yet approved or cleared by  the [employer] and  has been authorized for detection and/or diagnosis of SARS-CoV-2 by FDA under an Emergency Use Authorization (EUA). This EUA will remain  in effect (meaning this test can be used) for the duration of the COVID-19 declaration under Section 56 4(b)(1) of the Act, 21 U.S.C. section 360bbb-3(b)(1), unless the authorization is terminated or revoked sooner. Performed at [facility], [place] [address] [place], [place] [address]   Culture, blood (routine x 2)     Status: None (Preliminary result)   Collection Time: 02/11/20  3:59 PM   Specimen: BLOOD LEFT ARM  Result Value Ref Range Status   Specimen Description BLOOD LEFT ARM  Final   Special Requests   Final    BOTTLES DRAWN AEROBIC AND ANAEROBIC Blood Culture adequate volume   Culture   Final    NO GROWTH 2 DAYS Performed at [facility], [place] [address] [place], [place] [address]    Report Status PENDING  Incomplete  Culture, blood (routine x 2)     Status: None (Preliminary result)   Collection Time: 02/11/20  4:13 PM   Specimen: BLOOD LEFT HAND  Result Value Ref Range Status   Specimen Description BLOOD LEFT HAND  Final   Special Requests AEROBIC BOTTLE ONLY Blood Culture adequate volume  Final   Culture   Final    NO GROWTH 2 DAYS Performed at [facility], [place] [address], [place], [place] [address]    Report Status PENDING  Incomplete         Radiology Studies: CT CHEST WO CONTRAST  Result Date: 02/12/2020 CLINICAL DATA:  Pneumonia. EXAM: CT CHEST WITHOUT CONTRAST TECHNIQUE: Multidetector CT imaging of the chest was performed following the standard protocol without IV contrast.  COMPARISON:  Chest radiograph Feb 10, 2020 FINDINGS: Cardiovascular: Enlarged heart. Heavy calcific atherosclerotic disease of the coronary arteries. Tortuosity and calcific atherosclerotic disease of the aorta. Post CABG postsurgical changes. No pericardial effusion. Mediastinum/Nodes: Mild mediastinal lymphadenopathy with the largest pretracheal lymph node measuring 1.7 cm. Lungs/Pleura: Moderate in size bilateral pleural effusions. Bilateral lower lobe confluent airspace consolidation. Additional patchy alveolar and interstitial opacities with central and dependent predominance. Upper Abdomen: No acute abnormality. Musculoskeletal: No chest wall mass or suspicious bone lesions identified. IMPRESSION: 1. Moderate in size bilateral pleural effusions. 2. Bilateral lower lobe confluent airspace consolidation may represent multifocal pneumonia versus segmental atelectasis. Additional patchy alveolar and interstitial opacities with central and dependent predominance likely represents superimposed mixed pattern pulmonary edema. 3. Mild mediastinal lymphadenopathy, likely reactive. 4. Enlarged heart. 5. Heavy calcific atherosclerotic disease of the coronary arteries. Aortic Atherosclerosis (ICD10-I70.0). Electronically Signed   By: Fidela Salisbury M.D.   On: 02/12/2020 11:50        Scheduled Meds: . apixaban  2.5 mg Oral BID  . feeding supplement (ENSURE ENLIVE)  237 mL Oral BID BM  . levothyroxine  100 mcg Oral Q0600  . metoprolol succinate  25 mg Oral Daily  . multivitamin with minerals  1 tablet Oral Daily  . pantoprazole  40 mg Oral Daily   Continuous Infusions: . sodium chloride Stopped (02/06/20 0117)     LOS: 9 days     Georgette Shell, MD  02/13/2020, 10:52 AM

## 2020-02-14 ENCOUNTER — Inpatient Hospital Stay (HOSPITAL_COMMUNITY): Payer: Medicare Other

## 2020-02-14 LAB — GLUCOSE, CAPILLARY
Glucose-Capillary: 109 mg/dL — ABNORMAL HIGH (ref 70–99)
Glucose-Capillary: 144 mg/dL — ABNORMAL HIGH (ref 70–99)
Glucose-Capillary: 154 mg/dL — ABNORMAL HIGH (ref 70–99)

## 2020-02-14 LAB — CYTOLOGY - NON PAP

## 2020-02-14 NOTE — Progress Notes (Signed)
Physical Therapy Treatment Patient Details Name: [name] MRN: [id number] DOB: [birth year] Today's Date: 02/14/2020    History of Present Illness Pt is a 86 y/o male admitted secondary to partial SBO vs ileus and suspected aspiration PNA with septic shock and AKI, S/P R thoracentesis 02/13/20. PMH includes s/p AVR, CAD s/p CABG, a fib, CKD, and HTN.    PT Comments    Pt demonstrates amb assistance up to mod(A) +2 and increased distance with chair follow for safety and stability; high fall risk. Pt required one step verbal and tactile cues for bed mobility, transfers and amb with BUE and BLE placement and amplitude of movement. Pt wife present for session, providing verbal encouragement with amb further distances. Will continue to follow acutely until d/c to SNF.   0.5L o2 [place] upon room entry 90-91% Spo2  1.5L o2 [place] amb 86-91% Spo2 0.5 o2 [place] end of session 94% Spo2  Follow Up Recommendations  SNF;Supervision/Assistance - 24 hour     Equipment Recommendations  Other (comment)(defer to next venue)    Recommendations for Other Services       Precautions / Restrictions Precautions Precautions: Fall;Other (comment) Precaution Comments: Bowel incontience Restrictions Weight Bearing Restrictions: No    Mobility  Bed Mobility Overal bed mobility: Needs Assistance Bed Mobility: Supine to Sit     Supine to sit: Mod assist;+2 for physical assistance     General bed mobility comments: Pt progressed BLE to EOB with minimal assistance and required mod(A) to progress trunk to upright position.  Transfers Overall transfer level: Needs assistance Equipment used: Rolling walker (2 wheeled) Transfers: Sit to/from [facility] Sit to Stand: Mod assist;+2 physical assistance;From elevated surface Stand pivot transfers: Min assist;+2 safety/equipment       General transfer comment: required assistance for power up with elevated bed, required verbal and tactile cues  for safe hand placement, min(A) of 2 for stand pivot to bedside commode for safety and stability  Ambulation/Gait Ambulation/Gait assistance: Min assist;+2 physical assistance Gait Distance (Feet): 12 Feet Assistive device: Rolling walker (2 wheeled) Gait Pattern/deviations: Step-to pattern;Trunk flexed;Shuffle;Wide base of support;Decreased stride length     General Gait Details: Pt required verbal cues for inreased step length with amb, Pt spo2 86-91% 1.5L [place] with mobility. Pt required min(A) of 2 for safety and stability with chair follow.   Stairs             Wheelchair Mobility    Modified Rankin (Stroke Patients Only)       Balance Overall balance assessment: Needs assistance Sitting-balance support: Bilateral upper extremity supported;Feet supported Sitting balance-Leahy Scale: Fair     Standing balance support: Bilateral upper extremity supported;During functional activity Standing balance-Leahy Scale: Poor Standing balance comment: Reliant on UE support and external assist                            Cognition Arousal/Alertness: Awake/alert Behavior During Therapy: WFL for tasks assessed/performed Overall Cognitive Status: History of cognitive impairments - at baseline Area of Impairment: Following commands;Problem solving;Awareness;Attention                   Current Attention Level: Sustained;Focused   Following Commands: Follows one step commands consistently;Follows one step commands with increased time     Problem Solving: Slow processing;Decreased initiation;Requires verbal cues;Requires tactile cues General Comments: pt history of cognitive impairments at baseline. pt demonstrated poor sustained attention, required verbal and tactile cues for  mobility, answering questions with increased time;pt repeating himself frequently, very complimentary of wife      Exercises      General Comments General comments (skin integrity, edema,  etc.): Wife present for session, encouraged independence. Pt BLE skin integreity appeared intact and WNL.      Pertinent Vitals/Pain Pain Assessment: No/denies pain Pain Intervention(s): Monitored during session    Home Living                      Prior Function            PT Goals (current goals can now be found in the care plan section) Acute Rehab PT Goals Patient Stated Goal: to go home PT Goal Formulation: With patient Time For Goal Achievement: 02/21/20 Potential to Achieve Goals: Fair Progress towards PT goals: Progressing toward goals    Frequency    Min 2X/week      PT Plan Current plan remains appropriate    Co-evaluation   Reason for Co-Treatment: Complexity of the patient's impairments (multi-system involvement);For patient/therapist safety;To address functional/ADL transfers   OT goals addressed during session: ADL's and self-care      AM-PAC PT "6 Clicks" Mobility   Outcome Measure  Help needed turning from your back to your side while in a flat bed without using bedrails?: A Lot Help needed moving from lying on your back to sitting on the side of a flat bed without using bedrails?: A Lot Help needed moving to and from a bed to a chair (including a wheelchair)?: A Lot Help needed standing up from a chair using your arms (e.g., wheelchair or bedside chair)?: A Lot Help needed to walk in hospital room?: A Lot Help needed climbing 3-5 steps with a railing? : Total 6 Click Score: 11    End of Session Equipment Utilized During Treatment: Gait belt;Oxygen Activity Tolerance: Patient tolerated treatment well Patient left: in chair;with call bell/phone within reach;with chair alarm set;with family/visitor present Nurse Communication: Mobility status PT Visit Diagnosis: Unsteadiness on feet (R26.81);Muscle weakness (generalized) (M62.81);Difficulty in walking, not elsewhere classified (R26.2)     Time: [id number] PT Time Calculation (min)  (ACUTE ONLY): 33 min  Charges:  $Therapeutic Activity: 8-22 mins                     [employer] SPT 02/14/2020    Rolland Porter 02/14/2020, 4:56 PM

## 2020-02-14 NOTE — Progress Notes (Signed)
Occupational Therapy Treatment Patient Details Name: [name] MRN: [id number] DOB: 08/15/1924 Today's Date: 02/14/2020    History of present illness Pt is a 86 y/o male admitted secondary to partial SBO vs ileus and suspected aspiration PNA with septic shock and AKI. PMH includes s/p AVR, CAD s/p CABG, a fib, CKD, and HTN.     OT comments  Pt sitting in bed upon arrival, agreeable to OT/PT session. Pt required modA to progress to EOB, modA+2 to stand from EOB and minA+2 to stand-pivot to [facility]. Pt then required minA for in room mobility, ambulating about 8 feet. Pt continues to be limited by cognition, decreased endurance, and decreased activity tolerance. Pt will continue to benefit from skilled OT services to maximize safety and independence with ADL/IADL and functional mobility. Will continue to follow acutely and progress as tolerated.   SpO2   At rest supine: 0.5lnc SpO2 88%-90% Sitting EOB: 0.5lnc SpO2 89%-90% Following transfer to [facility]: 0.5lnc SpO2 87%-89% With mobility: 1.5lnc SpO2 86%-88% Return to sitting with pursed lip breathing: 1.5lnc SpO2 88%-91%, return to 0.5lnc SpO2 90%   Follow Up Recommendations  SNF;Supervision/Assistance - 24 hour    Equipment Recommendations  3 in 1 bedside commode    Recommendations for Other Services      Precautions / Restrictions Precautions Precautions: Fall;Other (comment) Precaution Comments: Bowel incontience Restrictions Weight Bearing Restrictions: No       Mobility Bed Mobility Overal bed mobility: Needs Assistance Bed Mobility: Supine to Sit     Supine to sit: Mod assist     General bed mobility comments: pt participating wiht progressing BLE to EOB requiring some assistance and assistance to progress trunk to upright position  Transfers Overall transfer level: Needs assistance Equipment used: Rolling walker (2 wheeled) Transfers: Sit to/from [facility] Sit to Stand: Mod assist;+2 physical  assistance Stand pivot transfers: Min assist;+2 safety/equipment       General transfer comment: modA to powerup into standing, vc for safe hand placement;minA for stand-pivot to [facility]    Balance Overall balance assessment: Needs assistance Sitting-balance support: Bilateral upper extremity supported;Feet supported Sitting balance-Leahy Scale: Fair     Standing balance support: Bilateral upper extremity supported;During functional activity Standing balance-Leahy Scale: Poor Standing balance comment: Reliant on UE support and external assist                           ADL either performed or assessed with clinical judgement   ADL Overall ADL's : Needs assistance/impaired Eating/Feeding: Minimal assistance Eating/Feeding Details (indicate cue type and reason): wife reports pt is requesting assistance for eating Grooming: Set up;Sitting                   Toilet Transfer: Minimal assistance;Stand-pivot;RW Toilet Transfer Details (indicate cue type and reason): stand pivot to [facility] Toileting- Clothing Manipulation and Hygiene: Moderate assistance;+2 for physical assistance;+2 for safety/equipment Toileting - Clothing Manipulation Details (indicate cue type and reason): modA to progress into stadning     Functional mobility during ADLs: Moderate assistance;Rolling walker;Cueing for safety;Cueing for sequencing;+2 for safety/equipment;Minimal assistance General ADL Comments: pt limited by weakness, cognition, balance;modA to progress into standing;minA for in room ambulation     Vision   Vision Assessment?: No apparent visual deficits   Perception     Praxis      Cognition Arousal/Alertness: Awake/alert Behavior During Therapy: WFL for tasks assessed/performed Overall Cognitive Status: History of cognitive impairments - at baseline Area  of Impairment: Following commands;Problem solving;Awareness;Attention                   Current Attention Level:  Focused;Sustained   Following Commands: Follows one step commands consistently;Follows one step commands with increased time     Problem Solving: Slow processing;Decreased initiation;Requires verbal cues;Requires tactile cues General Comments: pt history of cognitive impairments at baseline. pt demonstrated poor sustained attention, required verbal and tactile cues for mobility, answering questions with increased time;pt repeating himself frequently, ,very complimentary of wife        Exercises     Shoulder Instructions       General Comments wife present and supportive during session, encouraging pt to engage more independently    Pertinent Vitals/ Pain       Pain Assessment: No/denies pain Pain Intervention(s): Monitored during session  Home Living                                          Prior Functioning/Environment              Frequency  Min 2X/week        Progress Toward Goals  OT Goals(current goals can now be found in the care plan section)  Progress towards OT goals: Progressing toward goals  Acute Rehab OT Goals Patient Stated Goal: to go home OT Goal Formulation: With patient Time For Goal Achievement: 02/22/20 Potential to Achieve Goals: Good ADL Goals Pt Will Perform Grooming: with supervision;standing Pt Will Perform Lower Body Dressing: sit to/from stand;with min assist Pt Will Transfer to Toilet: with min guard assist;bedside commode;ambulating Additional ADL Goal #1: Pt will complete bed mobility with supervision as precursor to ADLs.  Plan Discharge plan remains appropriate    Co-evaluation    PT/OT/SLP Co-Evaluation/Treatment: Yes Reason for Co-Treatment: Complexity of the patient's impairments (multi-system involvement);For patient/therapist safety;To address functional/ADL transfers   OT goals addressed during session: ADL's and self-care      AM-PAC OT "6 Clicks" Daily Activity     Outcome Measure   Help from  another person eating meals?: A Little Help from another person taking care of personal grooming?: A Little Help from another person toileting, which includes using toliet, bedpan, or urinal?: A Little Help from another person bathing (including washing, rinsing, drying)?: A Lot Help from another person to put on and taking off regular upper body clothing?: A Little Help from another person to put on and taking off regular lower body clothing?: A Lot 6 Click Score: 16    End of Session Equipment Utilized During Treatment: Gait belt;Rolling walker  OT Visit Diagnosis: Other abnormalities of gait and mobility (R26.89);Muscle weakness (generalized) (M62.81)   Activity Tolerance Patient tolerated treatment well   Patient Left in chair;with call bell/phone within reach;with chair alarm set;with family/visitor present   Nurse Communication Mobility status;Precautions        Time: 1345-1420 OT Time Calculation (min): 35 min  Charges: OT General Charges $OT Visit: 1 Visit OT Treatments $Self Care/Home Management : 8-22 mins  Helene Kelp OTR/L Acute Rehabilitation Services Office: [place] 02/14/2020, 2:35 PM

## 2020-02-14 NOTE — Progress Notes (Addendum)
SATURATION QUALIFICATIONS: (This note is used to comply with regulatory documentation for home oxygen)  Patient Saturations on Room Air at Rest = 88-89%  Patient Saturations on Room Air while Ambulating = <86%  Patient Saturations on 2 Liters of oxygen while Ambulating = 90 %  Please briefly explain why patient needs home oxygen:  See note from OT: At rest supine: 0.5lnc SpO2 88%-90% Sitting EOB: 0.5lnc SpO2 89%-90% Following transfer to [facility]: 0.5lnc SpO2 87%-89% With mobility: 1.5lnc SpO2 86%-88% Return to sitting with pursed lip breathing: 1.5lnc SpO2 88%-91%, return to 0.5lnc SpO2 90%

## 2020-02-14 NOTE — Plan of Care (Signed)
  Problem: Education: Goal: Knowledge of General Education information will improve Description Including pain rating scale, medication(s)/side effects and non-pharmacologic comfort measures Outcome: Progressing   

## 2020-02-14 NOTE — Progress Notes (Signed)
PROGRESS NOTE    [name]  [id number] DOB: [birth year] DOA: 02/04/2020 PCP: Mayra Neer, MD  Brief Narrative:86 y.o.malewith a history of CAD s/p CABG and AS AVR 2013, AFib on eliquis, HTN, and hypothyroidism who presented on 5/1 with SBO versus ileus and suspected aspiration pneumonia with septic shock requiring pressor support, AKI, and AFib with RVR.   In the [id number] to 102.8, tachypneic and tacycardic. BP's low normal. Patient requring 10 L HFNC. Labs remarkable for: K 3.1, Cr 2.24. Lactate 2.3>2.8>3.6>4.3. WBC 10.2, INR 18.3, Mag 2.2, PTT 38. GI consulted for possible SBP and will follow. NGT placed. Cefepime, Vanc, Flagyl for possible PNA. Zofran given. S/p 3L IVF. [id number] improved aeration compared to the plain film of 06/22/2019, there is evidence of acute CHF and bilateral small pleuraleffusions.CT abd/pelvis showed: Multiple dilated jejunal loops with somewhat decompressed colon. No transition point identified. Small amount of free fluid over the right pericolic gutter. Findings may be due to early/partial small bowel obstruction versus ileus. 2. Moderate airspace opacification over the mid to lower right lung and left base likely due to infection with associated basilar atelectasis. Moderate size left effusion with enhancing pleura suggesting empyema.  He was admitted to the ICU, NG tube placed, surgery consulted. Hemodynamics have improved, weaned from pressors, antibiotics narrowed to zosyn. SBO since resolved.  5/10-patient is resting in bed he is on 1 L of oxygen saturating 97%. CT of the chest done 02/12/2020 shows moderate bilateral pleural effusions.  5/11-  Assessment & Plan:   Principal Problem:   SBO (small bowel obstruction) (HCC) Active Problems:   S/P aortic valve replacement with bioprosthetic valve   S/P CABG (coronary artery bypass graft)   Atrial fibrillation (HCC)   Essential hypertension   Hypothyroidism   Aspiration  pneumonia (HCC)   Sepsis (HCC)   AKI (acute kidney injury) ([place])   CKD (chronic kidney disease) stage 2, GFR 60-89 ml/min   Septic shock ([place])   Community acquired pneumonia of right lung   Pressure injury of skin   #1 status post septic shock present on admission secondary to aspiration pneumonia resolved. Patient was treated with Zosyn for 8 days. His oxygen requirement has improved. Currently he is on 1 L saturating 97% at rest. Check ambulatory oxygen saturation. Patient was seen by speech therapy cleared for soft diet. WBC is trending down to 15.9 from UA clear blood cultures negative.  CT chest 02/12/2020-Moderate in size bilateral pleural effusions. Bilateral lower lobe confluent airspace consolidation may represent multifocal pneumonia versus segmental atelectasis. Additional patchy alveolar and interstitial opacities with central and dependent predominance likely represents superimposed mixed pattern pulmonary edema. Mild mediastinal lymphadenopathy, likely reactive. Enlarged heart. Heavy calcific atherosclerotic disease of the coronary arteries.  Status post ultrasound-guided diagnostic and therapeutic right thoracentesis yielded 600 mL of yellow fluid. Fluid studies with no growth so far.  Appears to be transudate. Recheck chest x-ray today Ambulatory oxygen saturation  #2 partial SBO versus ileus has resolved patient is tolerating a regular diet and having bowel movements.  #3 acute on chronic diastolic heart failure-patient had received fluid for sepsis protocol followed by which he had pulmonary edema. He was given Lasix. Stable at this time.  #4 AKI on CKD stage IIIa baseline creatinine around 1.1.  #5 hypothyroidism continue Synthroid TSH 2.3  #6 hypoglycemia-resolved  #7 hypokalemia-resolved  #8 pressure injury medial sacral stage I present on admission  Pressure Injury 02/05/20 Sacrum Medial Stage 1 -  Intact skin with non-blanchable redness  of a localized area usually over a bony prominence. (Active)  02/05/20 0000  Location: Sacrum  Location Orientation: Medial  Staging: Stage 1 -  Intact skin with non-blanchable redness of a localized area usually over a bony prominence.  Wound Description (Comments):   Present on Admission: Yes    Nutrition Problem: Inadequate oral intake Etiology: altered GI function  Signs/Symptoms: meal completion < 50%    Interventions: Ensure Enlive (each supplement provides 350kcal and 20 grams of protein), Magic cup, MVI  Estimated body mass index is 24 kg/m as calculated from the following:   Height as of this encounter: 5\' 8"  (1.727 m).   Weight as of this encounter: 71.6 kg.  DVT prophylaxis: Eliquis Code Status DNR Family Communication: Discussed with patient's daughter Disposition Plan:  Status is: Inpatient patient with bilateral pleural effusion IR consulted status post thoracentesis from the right side repeat chest x-ray today if remains stable consider discharge to snf in a.m.  Dispo: The patient is from: Home  Anticipated d/c is to: SNF  Anticipated d/c date is1 day  Patient currently not stable to be discharged    Consultants:   Interventional radiology PCCM and general surgery  Procedures: None Antimicrobials: None  Subjective: Patient resting in bed in no acute distress no family at bedside  Objective: Vitals:   02/13/20 0500 02/13/20 1407 02/13/20 2126 02/14/20 0458  BP:  (!) 119/59 124/82 117/70  Pulse:  84 73 75  Resp:  19 16 16   Temp:  97.8 F (36.6 C) 98.4 F (36.9 C) 98.1 F (36.7 C)  TempSrc:  Oral Oral Oral  SpO2:  95% 96% 98%  Weight: 71.6 kg     Height:        Intake/Output Summary (Last 24 hours) at 02/14/2020 1415 Last data filed at 02/14/2020 1002 Gross per 24 hour  Intake 520 ml  Output 1300 ml  Net -780 ml   Filed Weights   02/10/20 0500 02/10/20 1802 02/13/20 0500  Weight: 83 kg 71.3 kg 71.6  kg    Examination:  General exam: Appears calm and comfortable  Respiratory system: Decreased breath sounds at the bases to auscultation. Respiratory effort normal. Cardiovascular system: S1 & S2 heard, RRR. No JVD, murmurs, rubs, gallops or clicks. No pedal edema. Gastrointestinal system: Abdomen is nondistended, soft and nontender. No organomegaly or masses felt. Normal bowel sounds heard. Central nervous system: Alert and oriented. No focal neurological deficits. Extremities: Trace bilateral pitting edema Skin: No rashes, lesions or ulcers Psychiatry: Judgement and insight appear normal. Mood & affect appropriate.     Data Reviewed: I have personally reviewed following labs and imaging studies  CBC: Recent Labs  Lab 02/09/20 0230 02/10/20 0306 02/11/20 0346 02/12/20 0304 02/13/20 0210  WBC 13.8* 16.7* 18.6*  18.6* 21.6* 15.8*  NEUTROABS  --   --  15.4* 18.5*  --   HGB 8.9* 9.3* 9.5*  9.5* 8.9* 8.6*  HCT 28.2* 28.5* 29.2*  29.2* 27.3* 26.0*  MCV 92.2 89.6 90.4  89.3 88.1 87.5  PLT 183 190 223  219 254 [id number]   Basic Metabolic Panel: Recent Labs  Lab 02/09/20 0230 02/10/20 0306 02/11/20 0346 02/12/20 0304 02/13/20 0210  NA 133* 131* 129* 132* 131*  K 3.9 3.7 3.3* 3.8 3.6  CL 98 97* 92* 92* 94*  CO2 26 26 26 27 28   GLUCOSE 160* 118* 131* 120* 127*  BUN 38* 31* 26* 26* 26*  CREATININE 1.32* 1.40* 1.45* 1.52* 1.42*  CALCIUM 9.4 9.0  8.9 9.2 9.1   GFR: Estimated Creatinine Clearance: 31.4 mL/min (A) (by C-G formula based on SCr of 1.42 mg/dL (H)). Liver Function Tests: Recent Labs  Lab 02/09/20 0230 02/10/20 0306 02/11/20 0346 02/12/20 0304 02/13/20 0210  AST 18 15 16  14* 14*  ALT 15 13 10 11 10   ALKPHOS 54 51 62 53 52  BILITOT 0.7 1.0 1.3* 0.8 0.7  PROT 6.1* 6.0* 6.2* 6.1* 6.2*  ALBUMIN 2.1* 1.9* 1.9* 1.7* 1.8*   No results for input(s): LIPASE, AMYLASE in the last 168 hours. No results for input(s): AMMONIA in the last 168 hours. Coagulation  Profile: No results for input(s): INR, PROTIME in the last 168 hours. Cardiac Enzymes: No results for input(s): CKTOTAL, CKMB, CKMBINDEX, TROPONINI in the last 168 hours. BNP (last 3 results) Recent Labs    07/25/19 1033 08/10/19 1411 01/20/20 1217  PROBNP 3,330* 6,202* 2,330*   HbA1C: No results for input(s): HGBA1C in the last 72 hours. CBG: Recent Labs  Lab 02/13/20 1218 02/13/20 1654 02/13/20 2139 02/14/20 0752 02/14/20 1218  GLUCAP 173* 145* 152* 109* 144*   Lipid Profile: No results for input(s): CHOL, HDL, LDLCALC, TRIG, CHOLHDL, LDLDIRECT in the last 72 hours. Thyroid Function Tests: No results for input(s): TSH, T4TOTAL, FREET4, T3FREE, THYROIDAB in the last 72 hours. Anemia Panel: No results for input(s): VITAMINB12, FOLATE, FERRITIN, TIBC, IRON, RETICCTPCT in the last 72 hours. Sepsis Labs: Recent Labs  Lab 02/10/20 0306  PROCALCITON 2.12    Recent Results (from the past 240 hour(s))  Respiratory Panel by RT PCR (Flu A&B, Covid) - Nasopharyngeal Swab     Status: None   Collection Time: 02/04/20  3:33 PM   Specimen: Nasopharyngeal Swab  Result Value Ref Range Status   SARS Coronavirus 2 by RT PCR NEGATIVE NEGATIVE Final    Comment: (NOTE) SARS-CoV-2 target nucleic acids are NOT DETECTED. The SARS-CoV-2 RNA is generally detectable in upper respiratoy specimens during the acute phase of infection. The lowest concentration of SARS-CoV-2 viral copies this assay can detect is 131 copies/mL. A negative result does not preclude SARS-Cov-2 infection and should not be used as the sole basis for treatment or other patient management decisions. A negative result may occur with  improper specimen collection/handling, submission of specimen other than nasopharyngeal swab, presence of viral mutation(s) within the areas targeted by this assay, and inadequate number of viral copies (<131 copies/mL). A negative result must be combined with clinical observations,  patient history, and epidemiological information. The expected result is Negative. Fact Sheet for Patients:  PinkCheek.be Fact Sheet for Healthcare Providers:  GravelBags.it This test is not yet ap proved or cleared by the [place] FDA and  has been authorized for detection and/or diagnosis of SARS-CoV-2 by FDA under an Emergency Use Authorization (EUA). This EUA will remain  in effect (meaning this test can be used) for the duration of the COVID-19 declaration under Section 564(b)(1) of the Act, 21 U.S.C. section 360bbb-3(b)(1), unless the authorization is terminated or revoked sooner.    Influenza A by PCR NEGATIVE NEGATIVE Final   Influenza B by PCR NEGATIVE NEGATIVE Final    Comment: (NOTE) The Xpert Xpress SARS-CoV-2/FLU/RSV assay is intended as an aid in  the diagnosis of influenza from Nasopharyngeal swab specimens and  should not be used as a sole basis for treatment. Nasal washings and  aspirates are unacceptable for Xpert Xpress SARS-CoV-2/FLU/RSV  testing. Fact Sheet for Patients: PinkCheek.be Fact Sheet for Healthcare Providers: GravelBags.it This test is not  yet approved or cleared by the [place] and  has been authorized for detection and/or diagnosis of SARS-CoV-2 by  FDA under an Emergency Use Authorization (EUA). This EUA will remain  in effect (meaning this test can be used) for the duration of the  Covid-19 declaration under Section 564(b)(1) of the Act, 21  U.S.C. section 360bbb-3(b)(1), unless the authorization is  terminated or revoked. Performed at [facility], [place] [address], [place], [place] [address]   MRSA PCR Screening     Status: None   Collection Time: 02/04/20 11:47 PM   Specimen: Nasal Mucosa; Nasopharyngeal  Result Value Ref Range Status   MRSA by PCR NEGATIVE NEGATIVE Final    Comment:        The GeneXpert  MRSA Assay (FDA approved for NASAL specimens only), is one component of a comprehensive MRSA colonization surveillance program. It is not intended to diagnose MRSA infection nor to guide or monitor treatment for MRSA infections. Performed at [facility], [place] [address] [place], [place] [address]   SARS CORONAVIRUS 2 (TAT 6-24 HRS) Nasopharyngeal Nasopharyngeal Swab     Status: None   Collection Time: 02/08/20  2:09 PM   Specimen: Nasopharyngeal Swab  Result Value Ref Range Status   SARS Coronavirus 2 NEGATIVE NEGATIVE Final    Comment: (NOTE) SARS-CoV-2 target nucleic acids are NOT DETECTED. The SARS-CoV-2 RNA is generally detectable in upper and lower respiratory specimens during the acute phase of infection. Negative results do not preclude SARS-CoV-2 infection, do not rule out co-infections with other pathogens, and should not be used as the sole basis for treatment or other patient management decisions. Negative results must be combined with clinical observations, patient history, and epidemiological information. The expected result is Negative. Fact Sheet for Patients: SugarRoll.be Fact Sheet for Healthcare Providers: https://www.woods-.com/ This test is not yet approved or cleared by the [place] FDA and  has been authorized for detection and/or diagnosis of SARS-CoV-2 by FDA under an Emergency Use Authorization (EUA). This EUA will remain  in effect (meaning this test can be used) for the duration of the COVID-19 declaration under Section 56 4(b)(1) of the Act, 21 U.S.C. section 360bbb-3(b)(1), unless the authorization is terminated or revoked sooner. Performed at [facility], [place] [address], [place], [place] [address]   Culture, blood (routine x 2)     Status: None (Preliminary result)   Collection Time: 02/11/20  3:59 PM   Specimen: BLOOD LEFT ARM  Result Value Ref Range Status   Specimen  Description BLOOD LEFT ARM  Final   Special Requests   Final    BOTTLES DRAWN AEROBIC AND ANAEROBIC Blood Culture adequate volume   Culture   Final    NO GROWTH 3 DAYS Performed at [facility], [address] [place], [place] [address]    Report Status PENDING  Incomplete  Culture, blood (routine x 2)     Status: None (Preliminary result)   Collection Time: 02/11/20  4:13 PM   Specimen: BLOOD LEFT HAND  Result Value Ref Range Status   Specimen Description BLOOD LEFT HAND  Final   Special Requests AEROBIC BOTTLE ONLY Blood Culture adequate volume  Final   Culture   Final    NO GROWTH 3 DAYS Performed at [facility], [place] [address], [place], [place] [address]    Report Status PENDING  Incomplete  Culture, body fluid-bottle     Status: None (Preliminary result)  Collection Time: 02/13/20  2:02 PM   Specimen: Pleura  Result Value Ref Range Status   Specimen Description PLEURAL FLUID  Final   Special Requests RIGHT THORACENTESIS  Final   Culture   Final    NO GROWTH < 24 HOURS Performed at [facility], [place] [address] [place], [place] [address]    Report Status PENDING  Incomplete  Gram stain     Status: None   Collection Time: 02/13/20  2:02 PM   Specimen: Pleura  Result Value Ref Range Status   Specimen Description PLEURAL FLUID  Final   Special Requests RIGHT THORACENTESIS  Final   Gram Stain   Final    WBC PRESENT,BOTH PMN AND MONONUCLEAR NO ORGANISMS SEEN CYTOSPIN SMEAR Performed at [facility], [address], [place], [place] [address]    Report Status 02/13/2020 FINAL  Final         Radiology Studies: DG Chest 1 View  Result Date: 02/13/2020 CLINICAL DATA:  86 year old male status post ultrasound-guided thoracentesis today. EXAM: CHEST  1 VIEW COMPARISON:  Noncontrast chest CT 02/12/2020. Portable chest 02/10/2020. FINDINGS: Portable AP upright view at 1343 hours. Stable lung volumes and mediastinal contours with prior CABG and  cardiac valve replacement. Calcified aortic atherosclerosis. Improved right lung ventilation with decreased veiling opacity. No convincing pneumothorax on that side. Left lung ventilation appears stable since 02/10/2020 with dense left lung base opacity. No left pneumothorax. Negative visible bowel gas pattern. Stable visualized osseous structures. IMPRESSION: 1. Right lung ventilation appears improved, with no pneumothorax identified following thoracentesis. 2. Stable left lung base opacity. Electronically Signed   By: Genevie Ann M.D.   On: 02/13/2020 13:56   IR THORACENTESIS ASP PLEURAL SPACE W/IMG GUIDE  Result Date: 02/13/2020 INDICATION: Patient with history of pneumonia, now with right pleural effusion. Request is made for diagnostic and therapeutic right thoracentesis. EXAM: ULTRASOUND GUIDED DIAGNOSTIC AND THERAPEUTIC RIGHT THORACENTESIS MEDICATIONS: 10 mL 1% lidocaine COMPLICATIONS: None immediate. PROCEDURE: An ultrasound guided thoracentesis was thoroughly discussed with the patient and questions answered. The benefits, risks, alternatives and complications were also discussed. The patient understands and wishes to proceed with the procedure. Written consent was obtained. Ultrasound was performed to localize and mark an adequate pocket of fluid in the right chest. The area was then prepped and draped in the normal sterile fashion. 1% Lidocaine was used for local anesthesia. Under ultrasound guidance a 6 Fr Safe-T-Centesis catheter was introduced. Thoracentesis was performed. The catheter was removed and a dressing applied. FINDINGS: A total of approximately 600 mL of yellow fluid was removed. Samples were sent to the laboratory as requested by the clinical team. IMPRESSION: Successful ultrasound guided right diagnostic and therapeutic thoracentesis yielding 600 mL of pleural fluid. Read by: Brynda Greathouse PA-C Electronically Signed   By: Jerilynn Mages.  Shick M.D.   On: 02/13/2020 15:24        Scheduled  Meds: . apixaban  2.5 mg Oral BID  . feeding supplement (ENSURE ENLIVE)  237 mL Oral BID BM  . levothyroxine  100 mcg Oral Q0600  . metoprolol succinate  25 mg Oral Daily  . multivitamin with minerals  1 tablet Oral Daily  . pantoprazole  40 mg Oral Daily   Continuous Infusions: . sodium chloride Stopped (02/06/20 0117)     LOS: 10 days     Georgette Shell, MD 02/14/2020, 2:15 PM

## 2020-02-15 DIAGNOSIS — A419 Sepsis, unspecified organism: Secondary | ICD-10-CM | POA: Diagnosis not present

## 2020-02-15 DIAGNOSIS — J9601 Acute respiratory failure with hypoxia: Secondary | ICD-10-CM | POA: Diagnosis not present

## 2020-02-15 DIAGNOSIS — R278 Other lack of coordination: Secondary | ICD-10-CM | POA: Diagnosis not present

## 2020-02-15 DIAGNOSIS — R2681 Unsteadiness on feet: Secondary | ICD-10-CM | POA: Diagnosis not present

## 2020-02-15 DIAGNOSIS — M6281 Muscle weakness (generalized): Secondary | ICD-10-CM | POA: Diagnosis not present

## 2020-02-15 DIAGNOSIS — D649 Anemia, unspecified: Secondary | ICD-10-CM | POA: Diagnosis not present

## 2020-02-15 DIAGNOSIS — K219 Gastro-esophageal reflux disease without esophagitis: Secondary | ICD-10-CM | POA: Diagnosis not present

## 2020-02-15 DIAGNOSIS — J90 Pleural effusion, not elsewhere classified: Secondary | ICD-10-CM | POA: Diagnosis not present

## 2020-02-15 DIAGNOSIS — E039 Hypothyroidism, unspecified: Secondary | ICD-10-CM | POA: Diagnosis not present

## 2020-02-15 DIAGNOSIS — I480 Paroxysmal atrial fibrillation: Secondary | ICD-10-CM | POA: Diagnosis not present

## 2020-02-15 DIAGNOSIS — E43 Unspecified severe protein-calorie malnutrition: Secondary | ICD-10-CM | POA: Diagnosis not present

## 2020-02-15 DIAGNOSIS — I2510 Atherosclerotic heart disease of native coronary artery without angina pectoris: Secondary | ICD-10-CM | POA: Diagnosis not present

## 2020-02-15 DIAGNOSIS — I5031 Acute diastolic (congestive) heart failure: Secondary | ICD-10-CM | POA: Diagnosis not present

## 2020-02-15 DIAGNOSIS — I4891 Unspecified atrial fibrillation: Secondary | ICD-10-CM | POA: Diagnosis not present

## 2020-02-15 DIAGNOSIS — Z953 Presence of xenogenic heart valve: Secondary | ICD-10-CM | POA: Diagnosis not present

## 2020-02-15 DIAGNOSIS — Z7401 Bed confinement status: Secondary | ICD-10-CM | POA: Diagnosis not present

## 2020-02-15 DIAGNOSIS — I10 Essential (primary) hypertension: Secondary | ICD-10-CM | POA: Diagnosis not present

## 2020-02-15 DIAGNOSIS — R6521 Severe sepsis with septic shock: Secondary | ICD-10-CM | POA: Diagnosis not present

## 2020-02-15 DIAGNOSIS — J189 Pneumonia, unspecified organism: Secondary | ICD-10-CM | POA: Diagnosis not present

## 2020-02-15 DIAGNOSIS — N182 Chronic kidney disease, stage 2 (mild): Secondary | ICD-10-CM | POA: Diagnosis not present

## 2020-02-15 DIAGNOSIS — I5030 Unspecified diastolic (congestive) heart failure: Secondary | ICD-10-CM | POA: Diagnosis not present

## 2020-02-15 DIAGNOSIS — Z951 Presence of aortocoronary bypass graft: Secondary | ICD-10-CM | POA: Diagnosis not present

## 2020-02-15 DIAGNOSIS — R109 Unspecified abdominal pain: Secondary | ICD-10-CM | POA: Diagnosis not present

## 2020-02-15 DIAGNOSIS — M2550 Pain in unspecified joint: Secondary | ICD-10-CM | POA: Diagnosis not present

## 2020-02-15 DIAGNOSIS — K8080 Other cholelithiasis without obstruction: Secondary | ICD-10-CM | POA: Diagnosis not present

## 2020-02-15 DIAGNOSIS — K56609 Unspecified intestinal obstruction, unspecified as to partial versus complete obstruction: Secondary | ICD-10-CM | POA: Diagnosis not present

## 2020-02-15 DIAGNOSIS — N179 Acute kidney failure, unspecified: Secondary | ICD-10-CM | POA: Diagnosis not present

## 2020-02-15 DIAGNOSIS — R1312 Dysphagia, oropharyngeal phase: Secondary | ICD-10-CM | POA: Diagnosis not present

## 2020-02-15 DIAGNOSIS — Z7901 Long term (current) use of anticoagulants: Secondary | ICD-10-CM | POA: Diagnosis not present

## 2020-02-15 DIAGNOSIS — K4090 Unilateral inguinal hernia, without obstruction or gangrene, not specified as recurrent: Secondary | ICD-10-CM | POA: Diagnosis not present

## 2020-02-15 DIAGNOSIS — J690 Pneumonitis due to inhalation of food and vomit: Secondary | ICD-10-CM | POA: Diagnosis not present

## 2020-02-15 DIAGNOSIS — R0902 Hypoxemia: Secondary | ICD-10-CM | POA: Diagnosis not present

## 2020-02-15 DIAGNOSIS — R41841 Cognitive communication deficit: Secondary | ICD-10-CM | POA: Diagnosis not present

## 2020-02-15 LAB — COMPREHENSIVE METABOLIC PANEL
ALT: 11 U/L (ref 0–44)
AST: 17 U/L (ref 15–41)
Albumin: 1.8 g/dL — ABNORMAL LOW (ref 3.5–5.0)
Alkaline Phosphatase: 57 U/L (ref 38–126)
Anion gap: 9 (ref 5–15)
BUN: 22 mg/dL (ref 8–23)
CO2: 28 mmol/L (ref 22–32)
Calcium: 9.4 mg/dL (ref 8.9–10.3)
Chloride: 96 mmol/L — ABNORMAL LOW (ref 98–111)
Creatinine, Ser: 1.2 mg/dL (ref 0.61–1.24)
GFR calc Af Amer: >60 mL/min (ref >60)
GFR calc non Af Amer: 52 mL/min — ABNORMAL LOW (ref >60)
Glucose, Bld: 123 mg/dL — ABNORMAL HIGH (ref 70–99)
Potassium: 3.5 mmol/L (ref 3.5–5.1)
Sodium: 133 mmol/L — ABNORMAL LOW (ref 135–145)
Total Bilirubin: 0.7 mg/dL (ref 0.3–1.2)
Total Protein: 6.3 g/dL — ABNORMAL LOW (ref 6.5–8.1)

## 2020-02-15 LAB — CBC
HCT: 26.4 % — ABNORMAL LOW (ref 39.0–52.0)
Hemoglobin: 8.6 g/dL — ABNORMAL LOW (ref 13.0–17.0)
MCH: 29 pg (ref 26.0–34.0)
MCHC: 32.6 g/dL (ref 30.0–36.0)
MCV: 88.9 fL (ref 80.0–100.0)
Platelets: 371 10*3/uL (ref 150–400)
RBC: 2.97 MIL/uL — ABNORMAL LOW (ref 4.22–5.81)
RDW: 14 % (ref 11.5–15.5)
WBC: 14.3 10*3/uL — ABNORMAL HIGH (ref 4.0–10.5)
nRBC: 0 % (ref 0.0–0.2)

## 2020-02-15 LAB — SARS CORONAVIRUS 2 (TAT 6-24 HRS): SARS Coronavirus 2: NEGATIVE

## 2020-02-15 LAB — GLUCOSE, CAPILLARY
Glucose-Capillary: 125 mg/dL — ABNORMAL HIGH (ref 70–99)
Glucose-Capillary: 110 mg/dL — ABNORMAL HIGH (ref 70–99)
Glucose-Capillary: 134 mg/dL — ABNORMAL HIGH (ref 70–99)

## 2020-02-15 MED ORDER — ACETAMINOPHEN 325 MG PO TABS: 650.0000 mg | ORAL_TABLET | Freq: Four times a day (QID) | ORAL | Status: AC | PRN

## 2020-02-15 MED ORDER — POLYETHYLENE GLYCOL 3350 17 G PO PACK: 17.0000 g | PACK | Freq: Every day | ORAL | 0 refills | Status: AC | PRN

## 2020-02-15 MED ORDER — ENSURE ENLIVE PO LIQD: 237.0000 mL | Freq: Two times a day (BID) | ORAL | 12 refills | Status: AC

## 2020-02-15 NOTE — Progress Notes (Addendum)
Report called to Caryl Pina, RN at [facility]. AVS printed to provide to [facility]. Copy of AVS also given to patient's daughter at bedside. Awaiting arrival of [facility].  1340 Patient transported via [place].

## 2020-02-15 NOTE — TOC Transition Note (Signed)
Transition of Care [facility]) - CM/SW Discharge Note   Patient Details  Name: [name] MRN: [id number] Date of Birth: [birth year]  Transition of [facility]) CM/SW Contact:  Alexander Mt, LCSW Phone Number: 02/15/2020, 10:42 AM   Clinical Narrative:    CSW spoke with pt daughter via telephone, updated her that pt doctor has confirmed that pt is stable for discharge today. Bed is available at [facility], pt family prefers transport via [place] [facility].   [facility] called for noon. RN Mendel Ryder aware.    Final next level of care: Skilled Nursing Facility Barriers to Discharge: Barriers Resolved   Patient Goals and CMS Choice Patient states their goals for this hospitalization and ongoing recovery are:: return home when able CMS Medicare.gov Compare Post Acute Care list provided to:: Patient Represenative (must comment)(pt daughter/pt wife) Choice offered to / list presented to : Adult Children, Spouse  Discharge Placement PASRR number recieved: 02/09/20            Patient chooses bed at: [facility] Patient to be transferred to facility by: [facility] Name of family member notified: pt daughter [name] via telephone Patient and family notified of of transfer: 02/15/20  Discharge Plan and Services In-house Referral: Clinical Social Work Discharge Planning Services: CM Consult, Follow-up appt scheduled Post Acute Care Choice: Durable Medical Equipment, Skilled Nursing Facility                               Social Determinants of Health (SDOH) Interventions     Readmission Risk Interventions Readmission Risk Prevention Plan 02/07/2020  Post Dischage Appt Not Complete  Appt Comments d/c to SNF  Medication Screening Complete  Transportation Screening Complete  PCP or Specialist Appt within 5-7 Days Complete  Home Care Screening Complete  Medication Review (RN CM) Referral to Pharmacy  Some recent data might be hidden

## 2020-02-15 NOTE — Progress Notes (Signed)
Notified by telemetry, patient has a BBB in lead 2. MD notified.

## 2020-02-15 NOTE — Social Work (Addendum)
Clinical Social Worker facilitated patient discharge including contacting patient family and facility to confirm patient discharge plans.  Clinical information faxed to facility and family agreeable with plan.  CSW arranged ambulance transport via [facility] to [facility] RN to call [phone]  with report prior to discharge.  Clinical Social Worker will sign off for now as social work intervention is no longer needed. Please consult [place] again if new need arises.  Westley Hummer, MSW, LCSW Clinical Social Worker

## 2020-02-15 NOTE — Discharge Summary (Signed)
Physician Discharge Summary  [name] PJA:[id number] DOB: Mar 04, 1924 DOA: 02/04/2020  PCP: Mayra Neer, MD  Admit date: 02/04/2020 Discharge date: 02/15/2020  Admitted From: home Disposition:  SNF   Recommendations for Outpatient Follow-up:  1. Continue 0.5-1 L of oxygen at rest and 2 L on exertion to keep his pulse ox above 90% and wean as able 2. Daily weights-adjust dose of Lasix as needed 3. Please recheck CBC and basic metabolic panel in 5 days    Discharge Condition: Stable CODE STATUS: DO NOT RESUSCITATE Diet recommendation: Heart healthy diet Consultations:  Pulmonary critical care  General surgery Procedures/Studies: . 2D echo 02/05/2020 1. Normal LV systolic function; mild LVH; s/p AVR with mean gradient 8  mmHg and trace AI; mild MR; biatrial enlargement; mild TR.  2. Left ventricular ejection fraction, by estimation, is 60 to 65%. The  left ventricle has normal function. The left ventricle has no regional  wall motion abnormalities. There is mild left ventricular hypertrophy.  Left ventricular diastolic function  could not be evaluated.  3. Right ventricular systolic function is normal. The right ventricular  size is normal. There is normal pulmonary artery systolic pressure.  4. Left atrial size was severely dilated.  5. Right atrial size was severely dilated.  6. The mitral valve is normal in structure. Mild mitral valve  regurgitation. No evidence of mitral stenosis.  7. The aortic valve has been repaired/replaced. Aortic valve  regurgitation is trivial. No aortic stenosis is present. There is a valve  present in the aortic position.  8. The inferior vena cava is normal in size with greater than 50%  respiratory variability, suggesting right atrial pressure of 3 mmHg.   02/13/2020-IR thoracentesis  Discharge Diagnoses:  Principal Problem:   SBO (small bowel obstruction) (HCC) Active Problems:   Septic shock and acute respiratory failure  -suspected to be secondary to pneumonia   S/P aortic valve replacement with bioprosthetic valve   S/P CABG (coronary artery bypass graft)   Atrial fibrillation (HCC)   Essential hypertension   Hypothyroidism   AKI (acute kidney injury) ([place])   CKD (chronic kidney disease) stage 2, GFR 60-89 ml/min   Pressure injury of skin     Brief Summary:  [name] is a 86 y.o. male with PMH aortic stenosis, CAD, bioprosthetic aortic valve, atrial fibrillation/flutter on Eliquis, HTN and chronic left pleural effusion presented to ER with vomiting and admitted for possible SBO and likely aspiration PNA.   In the ED: Febrile to 102.8, tachypneic and tacycardic. BP's low normal. Patient requring 10 L HFNC.   CT Abd/Pelv: 1. Multiple dilated jejunal loops with somewhat decompressed colon. No transition point identified. Small amount of free fluid over the right pericolic gutter. Findings may be due to early/partial small bowel obstruction versus ileus. 2. Moderate airspace opacification over the mid to lower right lung and left base likely due to infection with associated basilar atelectasis. Moderate size left effusion with enhancing pleura suggesting empyema.  Initial treatment plan: NPO, NGT, broad spectrum antibiotics< IVF  Hospital Course:  SBO? Treated conservatively with NG tube and bowel rest-subsequently resolved-appreciate surgery eval  Acute hypoxic respiratory failure septic shock secondary left lower lobe pneumonia Acute on chronic diastolic heart failure -The patient eventually needed pressors and therefore met criteria for septic shock -Initially there was a high suspicion for aspiration related to recent vomiting-interestingly his strep pneumonia urinary antigen was noted to be positive and thus he may have had streptococcal community-acquired pneumonia -Blood cultures  were negative -Treated with broad-spectrum IV antibiotics initially and subsequently Zosyn for total of 8  days -He was also found to be fluid overloaded eventually (possibly related to initial resuscitative efforts with IV fluids) and chronic diastolic heart failure and needed Lasix -Due to ongoing effusion he underwent a right diagnostic and therapeutic thoracentesis yielding 600 cc of yellow fluid which has been found to be transudate of -Last imaging of the chest was done yesterday with a chest x-ray which revealed possible mild bilateral edema versus asymmetric infection with small pleural effusions-it has been decided not to diurese him further with IV diuretics-he is on daily Lasix at home which has been resumed on discharge-his 2D echo on 5/2 reveals a normal EF and diastolic function could not be measured by I suspect he does have diastolic heart failure -He currently still requires 1 L of oxygen at rest and 2 L when he tries to exert himself and will be discharged to SNF on this amount of oxygen  AKI-CKD stage IIIa -BUN 59 and creatinine 2.  5 seen 3 patient 24 when admitted-the patient was hydrated aggressively for septic shock and suspected volume depletion -BUN has improved to 22 and creatinine has improved to 1.20-GFR is 52 placing him in the category of CKD stage IIIa  A. fib with RVR -Initially rate was noted to be uncontrolled-today he is rate controlled -Continue apixaban and metoprolol Toprol  Hypothyroidism -Continue Synthroid  Pressure Injury 02/05/20 Sacrum Medial Stage 1 -  Intact skin with non-blanchable redness of a localized area usually over a bony prominence. (Active)  02/05/20 0000  Location: Sacrum  Location Orientation: Medial  Staging: Stage 1 -  Intact skin with non-blanchable redness of a localized area usually over a bony prominence.  Wound Description (Comments):   Present on Admission: Yes    Other abnormalities during the hospital stay included hypoglycemia, hypokalemia and have resolved  Discharge Exam: Vitals:   02/14/20 2056 02/15/20 0427  BP: 112/68  138/70  Pulse: 65 (!) 101  Resp: 18 17  Temp: 98.1 F (36.7 C) 98.5 F (36.9 C)  SpO2:  90%   Vitals:   02/14/20 0458 02/14/20 1534 02/14/20 2056 02/15/20 0427  BP: 117/70 (!) 123/49 112/68 138/70  Pulse: 75 86 65 (!) 101  Resp: '16 20 18 17  ' Temp: 98.1 F (36.7 C) 97.9 F (36.6 C) 98.1 F (36.7 C) 98.5 F (36.9 C)  TempSrc: Oral Oral Oral Oral  SpO2: 98% 98%  90%  Weight:      Height:        General: Pt is alert, awake, not in acute distress Cardiovascular: RRR, S1/S2 +, no rubs, no gallops Respiratory: Coarse crackles at bases, no wheezing, no rhonchi Abdominal: Soft, NT, ND, bowel sounds + Extremities: no edema, no cyanosis   Discharge Instructions  Discharge Instructions    Diet - low sodium heart healthy   Complete by: As directed    Increase activity slowly   Complete by: As directed      Allergies as of 02/15/2020   No Known Allergies     Medication List    TAKE these medications   acetaminophen 325 MG tablet Commonly known as: TYLENOL Take 2 tablets (650 mg total) by mouth every 6 (six) hours as needed for mild pain or headache.   apixaban 2.5 MG Tabs tablet Commonly known as: Eliquis Take 1 tablet (2.5 mg total) by mouth 2 (two) times daily.   famotidine 20 MG tablet Commonly known  as: PEPCID Take 20 mg by mouth daily.   feeding supplement (ENSURE ENLIVE) Liqd Take 237 mLs by mouth 2 (two) times daily between meals.   ferrous sulfate 325 (65 FE) MG tablet Take 325 mg by mouth 2 (two) times daily with a meal.   furosemide 40 MG tablet Commonly known as: LASIX Take 1 tablet (40 mg total) by mouth daily.   levothyroxine 100 MCG tablet Commonly known as: SYNTHROID Take 100 mcg by mouth daily before breakfast.   metoprolol succinate 25 MG 24 hr tablet Commonly known as: TOPROL-XL Take 25 mg by mouth daily.   multivitamin with minerals Tabs tablet Take 1 tablet by mouth daily.   polyethylene glycol 17 g packet Commonly known as:  MIRALAX / GLYCOLAX Take 17 g by mouth daily as needed for moderate constipation.   vitamin C 500 MG tablet Commonly known as: ASCORBIC ACID Take 500 mg by mouth daily.      Contact information for after-discharge care    Destination    [facility] Preferred SNF .   Service: Skilled Nursing Contact information: [place] [phone]             No Known Allergies    CT ABDOMEN PELVIS WO CONTRAST  Result Date: 02/04/2020 CLINICAL DATA:  Suspect bowel obstruction. Sepsis. Previous bowel surgeries. Nausea, vomiting and diarrhea. Acute renal insufficiency. EXAM: CT ABDOMEN AND PELVIS WITHOUT CONTRAST TECHNIQUE: Multidetector CT imaging of the abdomen and pelvis was performed following the standard protocol without IV contrast. COMPARISON:  11/10/2011 FINDINGS: Lower chest: Sternotomy wires are present. Prosthetic material versus calcification over the mitral valve. Suggestion of previous aortic valve surgery. Moderate bibasilar opacification likely due to infection as well as associated basilar atelectasis. There is a small to moderate size left pleural effusion with enhancing pleura suggesting empyema. Calcification over the descending thoracic aorta. Hepatobiliary: Cholelithiasis. Liver and biliary tree are unremarkable. Pancreas: Normal. Spleen: Normal. Adrenals/Urinary Tract: Adrenal glands are normal. Kidneys normal in size without hydronephrosis or nephrolithiasis. Small cyst over the upper pole right kidney. Ureters and bladder are normal. Stomach/Bowel: Mild gastric distension. Fluid over the distal esophagus which may be due to reflux or dysmotility. Mild dilatation of the distal aspect of the duodenal C sweep. There are multiple air and fluid-filled mildly dilated jejunal loops measuring up to 3.5 cm in diameter. No definite transition point. Surgical sutures over the right colon with ileocolic anastomosis. Moderate  diverticulosis of the distal descending and sigmoid colon without active inflammation. Colon is somewhat decompressed. Vascular/Lymphatic: Moderate calcified plaque over the abdominal aorta which is normal in caliber. No adenopathy. Reproductive: Normal. Other: Small right inguinal hernia containing a short segment of small bowel. Small amount of free fluid over the right pericolic gutter. No significant focal inflammatory change. No free peritoneal air. Musculoskeletal: Degenerative changes of the spine and hips. IMPRESSION: 1. Multiple dilated jejunal loops with somewhat decompressed colon. No transition point identified. Small amount of free fluid over the right pericolic gutter. Findings may be due to early/partial small bowel obstruction versus ileus. 2. Moderate airspace opacification over the mid to lower right lung and left base likely due to infection with associated basilar atelectasis. Moderate size left effusion with enhancing pleura suggesting empyema. 3.  Cholelithiasis. 4.  Colonic diverticulosis. 5.  Small upper pole right renal cyst. 6.  Aortic Atherosclerosis (ICD10-I70.0). 7.  Right inguinal hernia containing a short segment of small bowel. Electronically Signed   By: Quillian Quince  Derrel Nip M.D.   On: 02/04/2020 17:07   DG Chest 1 View  Result Date: 02/13/2020 CLINICAL DATA:  86 year old male status post ultrasound-guided thoracentesis today. EXAM: CHEST  1 VIEW COMPARISON:  Noncontrast chest CT 02/12/2020. Portable chest 02/10/2020. FINDINGS: Portable AP upright view at 1343 hours. Stable lung volumes and mediastinal contours with prior CABG and cardiac valve replacement. Calcified aortic atherosclerosis. Improved right lung ventilation with decreased veiling opacity. No convincing pneumothorax on that side. Left lung ventilation appears stable since 02/10/2020 with dense left lung base opacity. No left pneumothorax. Negative visible bowel gas pattern. Stable visualized osseous structures. IMPRESSION:  1. Right lung ventilation appears improved, with no pneumothorax identified following thoracentesis. 2. Stable left lung base opacity. Electronically Signed   By: Genevie Ann M.D.   On: 02/13/2020 13:56   CT Head Wo Contrast  Result Date: 02/04/2020 CLINICAL DATA:  Altered mental status. EXAM: CT HEAD WITHOUT CONTRAST TECHNIQUE: Contiguous axial images were obtained from the base of the skull through the vertex without intravenous contrast. COMPARISON:  CT dated 07/08/2019. FINDINGS: Brain: No evidence of acute infarction, hemorrhage, hydrocephalus, extra-axial collection or mass lesion/mass effect. Atrophy and chronic microvascular ischemic changes are again noted. Vascular: No hyperdense vessel or unexpected calcification. Skull: Normal. Negative for fracture or focal lesion. Sinuses/Orbits: This complete opacification of the right maxillary sinus which is likely chronic. The remaining paranasal sinuses and mastoid air cells are essentially clear. Other: None. IMPRESSION: 1. No acute intracranial abnormality. 2. Atrophy and chronic microvascular ischemic changes. 3. Chronic right maxillary sinusitis. Electronically Signed   By: Constance Holster M.D.   On: 02/04/2020 16:55   CT CHEST WO CONTRAST  Result Date: 02/12/2020 CLINICAL DATA:  Pneumonia. EXAM: CT CHEST WITHOUT CONTRAST TECHNIQUE: Multidetector CT imaging of the chest was performed following the standard protocol without IV contrast. COMPARISON:  Chest radiograph Feb 10, 2020 FINDINGS: Cardiovascular: Enlarged heart. Heavy calcific atherosclerotic disease of the coronary arteries. Tortuosity and calcific atherosclerotic disease of the aorta. Post CABG postsurgical changes. No pericardial effusion. Mediastinum/Nodes: Mild mediastinal lymphadenopathy with the largest pretracheal lymph node measuring 1.7 cm. Lungs/Pleura: Moderate in size bilateral pleural effusions. Bilateral lower lobe confluent airspace consolidation. Additional patchy alveolar and  interstitial opacities with central and dependent predominance. Upper Abdomen: No acute abnormality. Musculoskeletal: No chest wall mass or suspicious bone lesions identified. IMPRESSION: 1. Moderate in size bilateral pleural effusions. 2. Bilateral lower lobe confluent airspace consolidation may represent multifocal pneumonia versus segmental atelectasis. Additional patchy alveolar and interstitial opacities with central and dependent predominance likely represents superimposed mixed pattern pulmonary edema. 3. Mild mediastinal lymphadenopathy, likely reactive. 4. Enlarged heart. 5. Heavy calcific atherosclerotic disease of the coronary arteries. Aortic Atherosclerosis (ICD10-I70.0). Electronically Signed   By: Fidela Salisbury M.D.   On: 02/12/2020 11:50   DG Chest Port 1 View  Result Date: 02/14/2020 CLINICAL DATA:  Hypoxemia EXAM: PORTABLE CHEST 1 VIEW COMPARISON:  02/13/2020 FINDINGS: Prior CABG and valve replacement. Cardiomegaly. Bilateral interstitial prominence diffusely. Perihilar and lower lobe airspace opacities. Small bilateral effusions. No acute bony abnormality. IMPRESSION: Suspect mild pulmonary edema/CHF versus asymmetric infection. Small bilateral effusions. Electronically Signed   By: Rolm Baptise M.D.   On: 02/14/2020 15:58   DG CHEST PORT 1 VIEW  Result Date: 02/10/2020 CLINICAL DATA:  Cough, congestive heart failure. EXAM: PORTABLE CHEST 1 VIEW COMPARISON:  Feb 08, 2020. FINDINGS: Stable cardiomegaly. Status post coronary artery bypass graft and aortic valve repair. Atherosclerosis thoracic aorta is noted. No pneumothorax is noted. Stable bilateral  lung opacities are noted most consistent with pulmonary edema. Small bilateral pleural effusions are noted. Bony thorax is unremarkable. IMPRESSION: Aortic atherosclerosis. Stable bilateral lung opacities are noted most consistent with pulmonary edema. Small bilateral pleural effusions are noted. Aortic Atherosclerosis (ICD10-I70.0).  Electronically Signed   By: Marijo Conception M.D.   On: 02/10/2020 08:11   DG CHEST PORT 1 VIEW  Result Date: 02/08/2020 CLINICAL DATA:  Pleural effusion.  CHF. EXAM: PORTABLE CHEST 1 VIEW COMPARISON:  02/04/2020 FINDINGS: Significant progression of diffuse bilateral airspace disease consistent with edema. Small pleural effusions. Mild bibasilar atelectasis CABG and aortic valve replacement.  Atherosclerotic aortic arch. IMPRESSION: Significant progression in diffuse bilateral airspace disease consistent with pulmonary edema. Small pleural effusions. Electronically Signed   By: Franchot Gallo M.D.   On: 02/08/2020 08:46   DG Chest Portable 1 View  Result Date: 02/04/2020 CLINICAL DATA:  86 year old male with a history of tachycardia EXAM: PORTABLE CHEST 1 VIEW COMPARISON:  06/22/2019 FINDINGS: Cardiomediastinal silhouette unchanged in size and contour with surgical changes of median sternotomy, CABG, aortic valve repair. Similar appearance of mitral calcifications. Soft tissues of the chin overlying the upper chest limit evaluation, however, no evidence of pneumothorax. Interlobular septal thickening, similar to the prior. Blunting of the left costophrenic sulcus. Hazy opacities at the bilateral lung bases though improved from the prior. IMPRESSION: Although improved aeration compared to the plain film of 06/22/2019, there is evidence of acute CHF and bilateral small pleural effusions. Surgical changes of median sternotomy, CABG, aortic valve repair. Electronically Signed   By: Corrie Mckusick D.O.   On: 02/04/2020 14:44   DG Abd Portable 1V-Small Bowel Obstruction Protocol-initial, 8 hr delay  Result Date: 02/06/2020 CLINICAL DATA:  Small-bowel obstruction EXAM: PORTABLE ABDOMEN - 1 VIEW COMPARISON:  Feb 05, 2020 FINDINGS: The enteric tube projects over the left upper quadrant. Oral contrast is noted in the small bowel and colon to the level of the rectum. Mildly dilated loops of small bowel are again noted in  the left abdomen. Advanced aortic calcifications are noted. IMPRESSION: 1. Oral contrast is noted in the small bowel and colon to the level of the rectum. 2. Enteric tube projects over the left upper quadrant. 3. Persistent mildly dilated loops of small bowel are noted in the left mid abdomen. Electronically Signed   By: Constance Holster M.D.   On: 02/06/2020 18:50   DG Abd Portable 1V  Result Date: 02/05/2020 CLINICAL DATA:  NG tube placement. EXAM: PORTABLE ABDOMEN - 1 VIEW COMPARISON:  CT 02/04/2020 FINDINGS: Nasogastric tube has tip over the stomach in the left upper quadrant with side-port in the region of the gastroesophageal junction. Persistent air-filled dilated small bowel loops over the left abdomen. No definite free peritoneal air. Remainder the exam is unchanged. IMPRESSION: Persistent dilated small bowel loops over the left abdomen. Nasogastric tube with tip over the stomach in the left upper quadrant. Electronically Signed   By: Marin Olp M.D.   On: 02/05/2020 10:24   ECHOCARDIOGRAM COMPLETE  Result Date: 02/05/2020    ECHOCARDIOGRAM REPORT   Patient Name:   [name] Date of Exam: 02/05/2020 Medical Rec #:  [id number]       Height:       68.0 in Accession #:    [id number]      Weight:       154.8 lb Date of Birth:  1923/12/22       BSA:  1.833 m Patient Age:    70 years        BP:           117/59 mmHg Patient Gender: M               HR:           89 bpm. Exam Location:  Inpatient Procedure: 2D Echo Indications:    Hypotension [[id number]]  History:        Patient has prior history of Echocardiogram examinations, most                 recent 03/30/2015. Prior CABG and S/P aortic valve replacement                 with bioprosthetic valve, Arrythmias:Atrial Fibrillation; Risk                 Factors:Hypertension. Sepsis                 CKD.                 Aortic Valve: valve is present in the aortic position.  Sonographer:    Vikki Ports Turrentine Referring Phys: [id number] [place]  1. Normal LV systolic function; mild LVH; s/p AVR with mean gradient 8 mmHg and trace AI; mild MR; biatrial enlargement; mild TR.  2. Left ventricular ejection fraction, by estimation, is 60 to 65%. The left ventricle has normal function. The left ventricle has no regional wall motion abnormalities. There is mild left ventricular hypertrophy. Left ventricular diastolic function could not be evaluated.  3. Right ventricular systolic function is normal. The right ventricular size is normal. There is normal pulmonary artery systolic pressure.  4. Left atrial size was severely dilated.  5. Right atrial size was severely dilated.  6. The mitral valve is normal in structure. Mild mitral valve regurgitation. No evidence of mitral stenosis.  7. The aortic valve has been repaired/replaced. Aortic valve regurgitation is trivial. No aortic stenosis is present. There is a valve present in the aortic position.  8. The inferior vena cava is normal in size with greater than 50% respiratory variability, suggesting right atrial pressure of 3 mmHg. FINDINGS  Left Ventricle: Left ventricular ejection fraction, by estimation, is 60 to 65%. The left ventricle has normal function. The left ventricle has no regional wall motion abnormalities. The left ventricular internal cavity size was normal in size. There is  mild left ventricular hypertrophy. Left ventricular diastolic function could not be evaluated due to atrial fibrillation. Left ventricular diastolic function could not be evaluated. Right Ventricle: The right ventricular size is normal. Right ventricular systolic function is normal. There is normal pulmonary artery systolic pressure. The tricuspid regurgitant velocity is 2.48 m/s, and with an assumed right atrial pressure of 8 mmHg,  the estimated right ventricular systolic pressure is 17.4 mmHg. Left Atrium: Left atrial size was severely dilated. Right Atrium: Right atrial size was severely dilated. Pericardium:  There is no evidence of pericardial effusion. Mitral Valve: The mitral valve is normal in structure. Normal mobility of the mitral valve leaflets. Moderate mitral annular calcification. Mild mitral valve regurgitation. No evidence of mitral valve stenosis. Tricuspid Valve: The tricuspid valve is normal in structure. Tricuspid valve regurgitation is mild . No evidence of tricuspid stenosis. Aortic Valve: The aortic valve has been repaired/replaced. Aortic valve regurgitation is trivial. No aortic stenosis is present. Aortic valve mean gradient measures 8.0 mmHg. Aortic valve peak gradient measures  15.3 mmHg. Aortic valve area, by VTI measures 2.53 cm. There is a valve present in the aortic position. Pulmonic Valve: The pulmonic valve was normal in structure. Pulmonic valve regurgitation is not visualized. No evidence of pulmonic stenosis. Aorta: The aortic root is normal in size and structure. Venous: The inferior vena cava is normal in size with greater than 50% respiratory variability, suggesting right atrial pressure of 3 mmHg. IAS/Shunts: No atrial level shunt detected by color flow Doppler. Additional Comments: Normal LV systolic function; mild LVH; s/p AVR with mean gradient 8 mmHg and trace AI; mild MR; biatrial enlargement; mild TR.  LEFT VENTRICLE PLAX 2D LVIDd:         3.80 cm LVIDs:         2.50 cm LV PW:         1.30 cm LV IVS:        1.30 cm LVOT diam:     1.90 cm LV SV:         89 LV SV Index:   49 LVOT Area:     2.84 cm  RIGHT VENTRICLE RV S prime:     6.85 cm/s TAPSE (M-mode): 0.8 cm LEFT ATRIUM           Index       RIGHT ATRIUM           Index LA diam:      5.20 cm 2.84 cm/m  RA Area:     28.80 cm LA Vol (A4C): 72.9 ml 39.78 ml/m RA Volume:   105.00 ml 57.29 ml/m  AORTIC VALVE AV Area (Vmax):    2.35 cm AV Area (Vmean):   2.56 cm AV Area (VTI):     2.53 cm AV Vmax:           195.50 cm/s AV Vmean:          134.500 cm/s AV VTI:            0.353 m AV Peak Grad:      15.3 mmHg AV Mean Grad:       8.0 mmHg LVOT Vmax:         162.00 cm/s LVOT Vmean:        121.400 cm/s LVOT VTI:          0.314 m LVOT/AV VTI ratio: 0.89  AORTA Ao Root diam: 2.70 cm MITRAL VALVE                TRICUSPID VALVE MV Area (PHT): 3.89 cm     TR Peak grad:   24.6 mmHg MV Decel Time: 195 msec     TR Vmax:        248.00 cm/s MV E velocity: 146.33 cm/s                             SHUNTS                             Systemic VTI:  0.31 m                             Systemic Diam: 1.90 cm Kirk Ruths MD Electronically signed by Kirk Ruths MD Signature Date/Time: 02/05/2020/2:41:25 PM    Final    IR THORACENTESIS ASP PLEURAL SPACE W/IMG GUIDE  Result Date: 02/13/2020 INDICATION: Patient with history of pneumonia, now  with right pleural effusion. Request is made for diagnostic and therapeutic right thoracentesis. EXAM: ULTRASOUND GUIDED DIAGNOSTIC AND THERAPEUTIC RIGHT THORACENTESIS MEDICATIONS: 10 mL 1% lidocaine COMPLICATIONS: None immediate. PROCEDURE: An ultrasound guided thoracentesis was thoroughly discussed with the patient and questions answered. The benefits, risks, alternatives and complications were also discussed. The patient understands and wishes to proceed with the procedure. Written consent was obtained. Ultrasound was performed to localize and mark an adequate pocket of fluid in the right chest. The area was then prepped and draped in the normal sterile fashion. 1% Lidocaine was used for local anesthesia. Under ultrasound guidance a 6 Fr Safe-T-Centesis catheter was introduced. Thoracentesis was performed. The catheter was removed and a dressing applied. FINDINGS: A total of approximately 600 mL of yellow fluid was removed. Samples were sent to the laboratory as requested by the clinical team. IMPRESSION: Successful ultrasound guided right diagnostic and therapeutic thoracentesis yielding 600 mL of pleural fluid. Read by: Brynda Greathouse PA-C Electronically Signed   By: Jerilynn Mages.  Shick M.D.   On: 02/13/2020 15:24       The results of significant diagnostics from this hospitalization (including imaging, microbiology, ancillary and laboratory) are listed below for reference.     Microbiology: Recent Results (from the past 240 hour(s))  SARS CORONAVIRUS 2 (TAT 6-24 HRS) Nasopharyngeal Nasopharyngeal Swab     Status: None   Collection Time: 02/08/20  2:09 PM   Specimen: Nasopharyngeal Swab  Result Value Ref Range Status   SARS Coronavirus 2 NEGATIVE NEGATIVE Final    Comment: (NOTE) SARS-CoV-2 target nucleic acids are NOT DETECTED. The SARS-CoV-2 RNA is generally detectable in upper and lower respiratory specimens during the acute phase of infection. Negative results do not preclude SARS-CoV-2 infection, do not rule out co-infections with other pathogens, and should not be used as the sole basis for treatment or other patient management decisions. Negative results must be combined with clinical observations, patient history, and epidemiological information. The expected result is Negative. Fact Sheet for Patients: SugarRoll.be Fact Sheet for Healthcare Providers: https://www.woods-mathews.com/ This test is not yet approved or cleared by the [place] FDA and  has been authorized for detection and/or diagnosis of SARS-CoV-2 by FDA under an Emergency Use Authorization (EUA). This EUA will remain  in effect (meaning this test can be used) for the duration of the COVID-19 declaration under Section 56 4(b)(1) of the Act, 21 U.S.C. section 360bbb-3(b)(1), unless the authorization is terminated or revoked sooner. Performed at [facility], [place] [address] [place], [place] [address]   Culture, blood (routine x 2)     Status: None (Preliminary result)   Collection Time: 02/11/20  3:59 PM   Specimen: BLOOD LEFT ARM  Result Value Ref Range Status   Specimen Description BLOOD LEFT ARM  Final   Special Requests   Final    BOTTLES DRAWN AEROBIC AND ANAEROBIC  Blood Culture adequate volume   Culture   Final    NO GROWTH 4 DAYS Performed at [facility], [place] [address] [place], [place] [address]    Report Status PENDING  Incomplete  Culture, blood (routine x 2)     Status: None (Preliminary result)   Collection Time: 02/11/20  4:13 PM   Specimen: BLOOD LEFT HAND  Result Value Ref Range Status   Specimen Description BLOOD LEFT HAND  Final   Special Requests AEROBIC BOTTLE ONLY Blood Culture adequate volume  Final   Culture   Final    NO GROWTH 4  DAYS Performed at [facility], [place] [address] [place], [place] [address]    Report Status PENDING  Incomplete  Culture, body fluid-bottle     Status: None (Preliminary result)   Collection Time: 02/13/20  2:02 PM   Specimen: Pleura  Result Value Ref Range Status   Specimen Description PLEURAL FLUID  Final   Special Requests RIGHT THORACENTESIS  Final   Culture   Final    NO GROWTH 2 DAYS Performed at [facility], [address] [place], [place] [address]    Report Status PENDING  Incomplete  Gram stain     Status: None   Collection Time: 02/13/20  2:02 PM   Specimen: Pleura  Result Value Ref Range Status   Specimen Description PLEURAL FLUID  Final   Special Requests RIGHT THORACENTESIS  Final   Gram Stain   Final    WBC PRESENT,BOTH PMN AND MONONUCLEAR NO ORGANISMS SEEN CYTOSPIN SMEAR Performed at [facility], [address] [place], [place] [address]    Report Status 02/13/2020 FINAL  Final  SARS CORONAVIRUS 2 (TAT 6-24 HRS) Nasopharyngeal Nasopharyngeal Swab     Status: None   Collection Time: 02/14/20  8:33 PM   Specimen: Nasopharyngeal Swab  Result Value Ref Range Status   SARS Coronavirus 2 NEGATIVE NEGATIVE Final    Comment: (NOTE) SARS-CoV-2 target nucleic acids are NOT DETECTED. The SARS-CoV-2 RNA is generally detectable in upper and lower respiratory specimens during the acute phase of infection. Negative results do not preclude SARS-CoV-2  infection, do not rule out co-infections with other pathogens, and should not be used as the sole basis for treatment or other patient management decisions. Negative results must be combined with clinical observations, patient history, and epidemiological information. The expected result is Negative. Fact Sheet for Patients: SugarRoll.be Fact Sheet for Healthcare Providers: https://www.woods-mathews.com/ This test is not yet approved or cleared by the [place] FDA and  has been authorized for detection and/or diagnosis of SARS-CoV-2 by FDA under an Emergency Use Authorization (EUA). This EUA will remain  in effect (meaning this test can be used) for the duration of the COVID-19 declaration under Section 56 4(b)(1) of the Act, 21 U.S.C. section 360bbb-3(b)(1), unless the authorization is terminated or revoked sooner. Performed at [facility], [place] [address], [place], [place] [address]      Labs: BNP (last 3 results) Recent Labs    02/04/20 1325 02/10/20 0306  BNP 315.3* 734.1*   Basic Metabolic Panel: Recent Labs  Lab 02/10/20 0306 02/11/20 0346 02/12/20 0304 02/13/20 0210 02/15/20 0133  NA 131* 129* 132* 131* 133*  K 3.7 3.3* 3.8 3.6 3.5  CL 97* 92* 92* 94* 96*  CO2 '26 26 27 28 28  ' GLUCOSE 118* 131* 120* 127* 123*  BUN 31* 26* 26* 26* 22  CREATININE 1.40* 1.45* 1.52* 1.42* 1.20  CALCIUM 9.0 8.9 9.2 9.1 9.4   Liver Function Tests: Recent Labs  Lab 02/10/20 0306 02/11/20 0346 02/12/20 0304 02/13/20 0210 02/15/20 0133  AST 15 16 14* 14* 17  ALT '13 10 11 10 11  ' ALKPHOS 51 62 53 52 57  BILITOT 1.0 1.3* 0.8 0.7 0.7  PROT 6.0* 6.2* 6.1* 6.2* 6.3*  ALBUMIN 1.9* 1.9* 1.7* 1.8* 1.8*   No results for input(s): LIPASE, AMYLASE in the last 168 hours. No results for input(s): AMMONIA in the last 168 hours. CBC: Recent Labs  Lab 02/10/20 0306 02/11/20 0346 02/12/20 0304 02/13/20 0210  02/15/20 0133  WBC 16.7*  18.6*  18.6* 21.6* 15.8* 14.3*  NEUTROABS  --  15.4* 18.5*  --   --   HGB 9.3* 9.5*  9.5* 8.9* 8.6* 8.6*  HCT 28.5* 29.2*  29.2* 27.3* 26.0* 26.4*  MCV 89.6 90.4  89.3 88.1 87.5 88.9  PLT 190 223  219 254 287 371   Cardiac Enzymes: No results for input(s): CKTOTAL, CKMB, CKMBINDEX, TROPONINI in the last 168 hours. BNP: Invalid input(s): POCBNP CBG: Recent Labs  Lab 02/13/20 2139 02/14/20 0752 02/14/20 1218 02/14/20 1723 02/15/20 0733  GLUCAP 152* 109* 144* 154* 110*   D-Dimer No results for input(s): DDIMER in the last 72 hours. Hgb A1c No results for input(s): HGBA1C in the last 72 hours. Lipid Profile No results for input(s): CHOL, HDL, LDLCALC, TRIG, CHOLHDL, LDLDIRECT in the last 72 hours. Thyroid function studies No results for input(s): TSH, T4TOTAL, T3FREE, THYROIDAB in the last 72 hours.  Invalid input(s): FREET3 Anemia work up No results for input(s): VITAMINB12, FOLATE, FERRITIN, TIBC, IRON, RETICCTPCT in the last 72 hours. Urinalysis    Component Value Date/Time   COLORURINE STRAW (A) 02/10/2020 1800   APPEARANCEUR CLEAR 02/10/2020 1800   LABSPEC 1.008 02/10/2020 1800   PHURINE 5.0 02/10/2020 1800   GLUCOSEU NEGATIVE 02/10/2020 1800   HGBUR NEGATIVE 02/10/2020 1800   BILIRUBINUR NEGATIVE 02/10/2020 1800   KETONESUR NEGATIVE 02/10/2020 1800   PROTEINUR NEGATIVE 02/10/2020 1800   UROBILINOGEN 0.2 09/17/2012 0358   NITRITE NEGATIVE 02/10/2020 1800   LEUKOCYTESUR NEGATIVE 02/10/2020 1800   Sepsis Labs Invalid input(s): PROCALCITONIN,  WBC,  LACTICIDVEN Microbiology Recent Results (from the past 240 hour(s))  SARS CORONAVIRUS 2 (TAT 6-24 HRS) Nasopharyngeal Nasopharyngeal Swab     Status: None   Collection Time: 02/08/20  2:09 PM   Specimen: Nasopharyngeal Swab  Result Value Ref Range Status   SARS Coronavirus 2 NEGATIVE NEGATIVE Final    Comment: (NOTE) SARS-CoV-2 target nucleic acids are NOT DETECTED. The SARS-CoV-2 RNA is generally detectable  in upper and lower respiratory specimens during the acute phase of infection. Negative results do not preclude SARS-CoV-2 infection, do not rule out co-infections with other pathogens, and should not be used as the sole basis for treatment or other patient management decisions. Negative results must be combined with clinical observations, patient history, and epidemiological information. The expected result is Negative. Fact Sheet for Patients: SugarRoll.be Fact Sheet for Healthcare Providers: https://www.woods-mathews.com/ This test is not yet approved or cleared by the [place] FDA and  has been authorized for detection and/or diagnosis of SARS-CoV-2 by FDA under an Emergency Use Authorization (EUA). This EUA will remain  in effect (meaning this test can be used) for the duration of the COVID-19 declaration under Section 56 4(b)(1) of the Act, 21 U.S.C. section 360bbb-3(b)(1), unless the authorization is terminated or revoked sooner. Performed at [facility], [place] [address] [place], [place] [address]   Culture, blood (routine x 2)     Status: None (Preliminary result)   Collection Time: 02/11/20  3:59 PM   Specimen: BLOOD LEFT ARM  Result Value Ref Range Status   Specimen Description BLOOD LEFT ARM  Final   Special Requests   Final    BOTTLES DRAWN AEROBIC AND ANAEROBIC Blood Culture adequate volume   Culture   Final    NO GROWTH 4 DAYS Performed at [facility], [place] [address] [place], [place] [address]    Report Status PENDING  Incomplete  Culture,  blood (routine x 2)     Status: None (Preliminary result)   Collection Time: 02/11/20  4:13 PM   Specimen: BLOOD LEFT HAND  Result Value Ref Range Status   Specimen Description BLOOD LEFT HAND  Final   Special Requests AEROBIC BOTTLE ONLY Blood Culture adequate volume  Final   Culture   Final    NO GROWTH 4 DAYS Performed at [facility], [place] [address],  [place], [place] [address]    Report Status PENDING  Incomplete  Culture, body fluid-bottle     Status: None (Preliminary result)   Collection Time: 02/13/20  2:02 PM   Specimen: Pleura  Result Value Ref Range Status   Specimen Description PLEURAL FLUID  Final   Special Requests RIGHT THORACENTESIS  Final   Culture   Final    NO GROWTH 2 DAYS Performed at [facility], [address] [place], [place] [address]    Report Status PENDING  Incomplete  Gram stain     Status: None   Collection Time: 02/13/20  2:02 PM   Specimen: Pleura  Result Value Ref Range Status   Specimen Description PLEURAL FLUID  Final   Special Requests RIGHT THORACENTESIS  Final   Gram Stain   Final    WBC PRESENT,BOTH PMN AND MONONUCLEAR NO ORGANISMS SEEN CYTOSPIN SMEAR Performed at [facility], [address] [place], [place] [address]    Report Status 02/13/2020 FINAL  Final  SARS CORONAVIRUS 2 (TAT 6-24 HRS) Nasopharyngeal Nasopharyngeal Swab     Status: None   Collection Time: 02/14/20  8:33 PM   Specimen: Nasopharyngeal Swab  Result Value Ref Range Status   SARS Coronavirus 2 NEGATIVE NEGATIVE Final    Comment: (NOTE) SARS-CoV-2 target nucleic acids are NOT DETECTED. The SARS-CoV-2 RNA is generally detectable in upper and lower respiratory specimens during the acute phase of infection. Negative results do not preclude SARS-CoV-2 infection, do not rule out co-infections with other pathogens, and should not be used as the sole basis for treatment or other patient management decisions. Negative results must be combined with clinical observations, patient history, and epidemiological information. The expected result is Negative. Fact Sheet for Patients: SugarRoll.be Fact Sheet for Healthcare Providers: https://www.woods-mathews.com/ This test is not yet approved or cleared by the [place] FDA and  has been authorized for detection and/or  diagnosis of SARS-CoV-2 by FDA under an Emergency Use Authorization (EUA). This EUA will remain  in effect (meaning this test can be used) for the duration of the COVID-19 declaration under Section 56 4(b)(1) of the Act, 21 U.S.C. section 360bbb-3(b)(1), unless the authorization is terminated or revoked sooner. Performed at [facility], [place] [address], [place], [place] [address]      Time coordinating discharge in minutes: 65  SIGNED:   Debbe Odea, MD  [facility] 02/15/2020, 9:32 AM

## 2020-02-16 LAB — CULTURE, BLOOD (ROUTINE X 2)
Culture: NO GROWTH
Culture: NO GROWTH
Special Requests: ADEQUATE
Special Requests: ADEQUATE

## 2020-02-18 LAB — CULTURE, BODY FLUID W GRAM STAIN -BOTTLE: Culture: NO GROWTH

## 2020-02-23 ENCOUNTER — Other Ambulatory Visit: Payer: Self-pay | Admitting: *Deleted

## 2020-02-23 NOTE — Patient Outreach (Signed)
Screened for potential [facility] Care Management needs as a benefit of  [employer] Medicare.  [name] is currently receiving skilled therapy at [facility].   Writer attended telephonic interdisciplinary team meeting to assess for disposition needs and transition plan for resident.   Facility reports member is from home with wife and daughter. Reports member is confused. Family care plan meeting scheduled today.   Will continue to follow for potential [facility] needs and transition plans.    [name], MSN-Ed, RN,BSN [place] Acute Care Coordinator [phone] [facility]) [phone]  (Toll free office)

## 2020-03-02 ENCOUNTER — Other Ambulatory Visit: Payer: Self-pay | Admitting: *Deleted

## 2020-03-02 NOTE — Patient Outreach (Signed)
Member screened for potential [name] Care Management needs as a benefit of [place] Medicare.  [name] is receiving skilled therapy at [facility].  Facility reports member will transition to home soon with family. Member to have swallow study on 03/05/20.  Facility advises Probation officer to contact member's daughter/DPR [name] to discuss [facility] services.     [name], MSN-Ed, RN,BSN [place] Acute Care Coordinator [phone] [facility]) [phone]  (Toll free office)

## 2020-03-08 ENCOUNTER — Other Ambulatory Visit: Payer: Self-pay | Admitting: *Deleted

## 2020-03-08 ENCOUNTER — Encounter: Payer: Self-pay | Admitting: *Deleted

## 2020-03-08 DIAGNOSIS — I10 Essential (primary) hypertension: Secondary | ICD-10-CM

## 2020-03-08 NOTE — Patient Outreach (Signed)
[facility]) Care Management  03/08/2020  [name] 03-14-1924 [id number]   Referral Date: 6/3 Referral Source: Post Acute Coordinator Date of Admission: 5/12 Diagnosis: Small bowel obstruction and aspiration pneumonia Date of Discharge: 6/1 Facility: [facility] Insurance: Next [employer]  Outreach attempt #1, successful to daughter Prudencio Pair identity verified.  This care manager introduced self and stated purpose of call.  [facility] care management services explained.    Social: Member lives with spouse only but has daughter that lives locally and able to provide assistance.  Daughter report he is confused at times, especially since being in the hospital but able to slowly bathe and dress on his own.  She and his wife help with preparing meals and other house work.  He uses cane to help him walk, has home health ordered through [facility].  Services has not started but she has received a call, waiting on call back to schedule visit.  Per PAC, [facility] has also been referred.  Daughter has been out on FMLA but will go back to work next week.  Conditions: Per chart, has history of A-fib, HTN, hypothyroidism, CKD, AVR, and CABG.  Medications: Reviewed with daughter, denies need for help with management.  State his Eliquis had been expensive but think he may have been switched to generic, which will be more affordable.  If it remains expensive, she will notify this care manager for financial assistance.  Appointments: Confirms PCP is Dr. Brigitte Pulse, has appointment scheduled for 6/4.  Daughter will provide transportation.    Denies any urgent concerns, encouraged to contact this care manager with questions.    Plan: RN CM will follow up within the next week.  Fall Risk  03/08/2020  Falls in the past year? 0  Number falls in past yr: 0  Injury with Fall? 0   Depression screen PHQ 2/9 12/15/2012  Decreased Interest 0  Down, Depressed, Hopeless 0  PHQ - 2 Score 0   THN CM Care  Plan Problem One     Most Recent Value  Care Plan Problem One  Risk for readmission related to complications of pneumonia as evidenced by recent hospital and SNF stay  Role Documenting the Problem One  Care Management [place] for Problem One  Active  [facility] Long Term Goal   Member will not be readmitted to hospital within the next 31 days  Interventions for Problem One Long Term Goal  Discharge instructions reviewed with daughter.  Discussed importance of following plan of care and aspiration prevention interventions in effort to decrease risk of readmission  [facility] CM Short Term Goal #1   Member will complete follow up appointment with PCP within the next week  [facility] CM Short Term Goal #1 Start Date  03/08/20  Interventions for Short Term Goal #1  Schedule for follow up reviewed with daughter.  Assessed need for transportation assistane  [facility] CM Short Term Goal #2   Member will have home health started within the next week  [facility]N CM Short Term Goal #2 Start Date  03/08/20  Interventions for Short Term Goal #2  Discussed needs with daughter, confirmed she has contact information for [facility], encouraged to follow up on start date     [name], Therapist, sports, MSN [place] [phone]

## 2020-03-08 NOTE — Patient Outreach (Addendum)
[place] Coordinator follow up  Telephone call made to daughter/DPR [name] [phone]. Patient identifiers confirmed.   [name] reports member came home on Tuesday, June 1st. States she is calling [facility] today because she has not heard from home health thus far.   [name] lives with spouse and daughter [name] assists. [name] states she will return to work next Tuesday.   Discussed [facility] Care Management services for care coordination and [facility] for home visits. [name] is agreeable.   [name] also reports member has follow up PCP appointment scheduled tomorrow.   Will make referral to [facility] and [facility].   [name] has a medical history of SBO, CABG, HTN, CKD stage 2, atrial fib/flutter, BPH.  Confirmed best contact information (home) [phone] and [name] mobile [phone].   Will send [place] Management and writer contact information to [place] via e-mail.   [name], MSN-Ed, RN,BSN [facility] Post Acute Care Coordinator [phone] [facility]) [phone]  (Toll free office)   [name], MSN-Ed, RN,BSN [place] Acute Care Coordinator [phone] [facility]) [phone]  (Toll free office)

## 2020-03-09 DIAGNOSIS — L89159 Pressure ulcer of sacral region, unspecified stage: Secondary | ICD-10-CM | POA: Diagnosis not present

## 2020-03-09 DIAGNOSIS — M170 Bilateral primary osteoarthritis of knee: Secondary | ICD-10-CM | POA: Diagnosis not present

## 2020-03-09 DIAGNOSIS — I2510 Atherosclerotic heart disease of native coronary artery without angina pectoris: Secondary | ICD-10-CM | POA: Diagnosis not present

## 2020-03-09 DIAGNOSIS — I081 Rheumatic disorders of both mitral and tricuspid valves: Secondary | ICD-10-CM | POA: Diagnosis not present

## 2020-03-09 DIAGNOSIS — J320 Chronic maxillary sinusitis: Secondary | ICD-10-CM | POA: Diagnosis not present

## 2020-03-09 DIAGNOSIS — I480 Paroxysmal atrial fibrillation: Secondary | ICD-10-CM | POA: Diagnosis not present

## 2020-03-09 DIAGNOSIS — K56609 Unspecified intestinal obstruction, unspecified as to partial versus complete obstruction: Secondary | ICD-10-CM | POA: Diagnosis not present

## 2020-03-09 DIAGNOSIS — N1831 Chronic kidney disease, stage 3a: Secondary | ICD-10-CM | POA: Diagnosis not present

## 2020-03-09 DIAGNOSIS — K5730 Diverticulosis of large intestine without perforation or abscess without bleeding: Secondary | ICD-10-CM | POA: Diagnosis not present

## 2020-03-09 DIAGNOSIS — M479 Spondylosis, unspecified: Secondary | ICD-10-CM | POA: Diagnosis not present

## 2020-03-09 DIAGNOSIS — D649 Anemia, unspecified: Secondary | ICD-10-CM | POA: Diagnosis not present

## 2020-03-09 DIAGNOSIS — K4090 Unilateral inguinal hernia, without obstruction or gangrene, not specified as recurrent: Secondary | ICD-10-CM | POA: Diagnosis not present

## 2020-03-09 DIAGNOSIS — I5033 Acute on chronic diastolic (congestive) heart failure: Secondary | ICD-10-CM | POA: Diagnosis not present

## 2020-03-09 DIAGNOSIS — I5030 Unspecified diastolic (congestive) heart failure: Secondary | ICD-10-CM | POA: Diagnosis not present

## 2020-03-09 DIAGNOSIS — E039 Hypothyroidism, unspecified: Secondary | ICD-10-CM | POA: Diagnosis not present

## 2020-03-09 DIAGNOSIS — N179 Acute kidney failure, unspecified: Secondary | ICD-10-CM | POA: Diagnosis not present

## 2020-03-09 DIAGNOSIS — N281 Cyst of kidney, acquired: Secondary | ICD-10-CM | POA: Diagnosis not present

## 2020-03-09 DIAGNOSIS — G309 Alzheimer's disease, unspecified: Secondary | ICD-10-CM | POA: Diagnosis not present

## 2020-03-09 DIAGNOSIS — I0981 Rheumatic heart failure: Secondary | ICD-10-CM | POA: Diagnosis not present

## 2020-03-09 DIAGNOSIS — J9601 Acute respiratory failure with hypoxia: Secondary | ICD-10-CM | POA: Diagnosis not present

## 2020-03-09 DIAGNOSIS — D631 Anemia in chronic kidney disease: Secondary | ICD-10-CM | POA: Diagnosis not present

## 2020-03-09 DIAGNOSIS — I4892 Unspecified atrial flutter: Secondary | ICD-10-CM | POA: Diagnosis not present

## 2020-03-09 DIAGNOSIS — I110 Hypertensive heart disease with heart failure: Secondary | ICD-10-CM | POA: Diagnosis not present

## 2020-03-09 DIAGNOSIS — A419 Sepsis, unspecified organism: Secondary | ICD-10-CM | POA: Diagnosis not present

## 2020-03-09 DIAGNOSIS — F0280 Dementia in other diseases classified elsewhere without behavioral disturbance: Secondary | ICD-10-CM | POA: Diagnosis not present

## 2020-03-09 DIAGNOSIS — I700 Atherosclerosis of aorta: Secondary | ICD-10-CM | POA: Diagnosis not present

## 2020-03-09 DIAGNOSIS — J690 Pneumonitis due to inhalation of food and vomit: Secondary | ICD-10-CM | POA: Diagnosis not present

## 2020-03-09 DIAGNOSIS — I130 Hypertensive heart and chronic kidney disease with heart failure and stage 1 through stage 4 chronic kidney disease, or unspecified chronic kidney disease: Secondary | ICD-10-CM | POA: Diagnosis not present

## 2020-03-09 DIAGNOSIS — J90 Pleural effusion, not elsewhere classified: Secondary | ICD-10-CM | POA: Diagnosis not present

## 2020-03-09 DIAGNOSIS — E43 Unspecified severe protein-calorie malnutrition: Secondary | ICD-10-CM | POA: Diagnosis not present

## 2020-03-09 DIAGNOSIS — J9811 Atelectasis: Secondary | ICD-10-CM | POA: Diagnosis not present

## 2020-03-09 DIAGNOSIS — I482 Chronic atrial fibrillation, unspecified: Secondary | ICD-10-CM | POA: Diagnosis not present

## 2020-03-12 DIAGNOSIS — I5033 Acute on chronic diastolic (congestive) heart failure: Secondary | ICD-10-CM | POA: Diagnosis not present

## 2020-03-12 DIAGNOSIS — I0981 Rheumatic heart failure: Secondary | ICD-10-CM | POA: Diagnosis not present

## 2020-03-12 DIAGNOSIS — J690 Pneumonitis due to inhalation of food and vomit: Secondary | ICD-10-CM | POA: Diagnosis not present

## 2020-03-12 DIAGNOSIS — N1831 Chronic kidney disease, stage 3a: Secondary | ICD-10-CM | POA: Diagnosis not present

## 2020-03-12 DIAGNOSIS — A419 Sepsis, unspecified organism: Secondary | ICD-10-CM | POA: Diagnosis not present

## 2020-03-12 DIAGNOSIS — I130 Hypertensive heart and chronic kidney disease with heart failure and stage 1 through stage 4 chronic kidney disease, or unspecified chronic kidney disease: Secondary | ICD-10-CM | POA: Diagnosis not present

## 2020-03-13 ENCOUNTER — Other Ambulatory Visit: Payer: Self-pay | Admitting: *Deleted

## 2020-03-13 DIAGNOSIS — N1831 Chronic kidney disease, stage 3a: Secondary | ICD-10-CM | POA: Diagnosis not present

## 2020-03-13 DIAGNOSIS — I130 Hypertensive heart and chronic kidney disease with heart failure and stage 1 through stage 4 chronic kidney disease, or unspecified chronic kidney disease: Secondary | ICD-10-CM | POA: Diagnosis not present

## 2020-03-13 DIAGNOSIS — I5033 Acute on chronic diastolic (congestive) heart failure: Secondary | ICD-10-CM | POA: Diagnosis not present

## 2020-03-13 DIAGNOSIS — A419 Sepsis, unspecified organism: Secondary | ICD-10-CM | POA: Diagnosis not present

## 2020-03-13 DIAGNOSIS — J690 Pneumonitis due to inhalation of food and vomit: Secondary | ICD-10-CM | POA: Diagnosis not present

## 2020-03-13 DIAGNOSIS — I0981 Rheumatic heart failure: Secondary | ICD-10-CM | POA: Diagnosis not present

## 2020-03-13 NOTE — Patient Outreach (Addendum)
[facility]) Care Management  03/13/2020  [name] 06-27-1924 [id number]   Weekly transition of care call placed to member's daughter, no answer.  HIPAA complaint voice message left, will follow up within the next 3-4 business days.    Update:  Call received back from daughter.  She report member is doing much better.  Confirms that they have had visits from [facility] for PT, OT, and speech as well as visit from [facility].  Family has ordered shower chair for tub and placed grab bars in the bathroom to decrease risk of falls.  He has also been seen by his PCP, Lasix was changed from 80 mg to 40 mg daily.  They are monitoring his weights for any fluid imbalance, denies member has had any shortness of breath, leg swelling, or chest discomfort.  Pulse ox was reportedly 96%, BP/HR was 112/68 and 78.  She denies any urgent concerns at this time, agrees to follow up within the next week.  [facility] CM Care Plan Problem One     Most Recent Value  Care Plan Problem One  Risk for readmission related to complications of pneumonia as evidenced by recent hospital and SNF stay  Role Documenting the Problem One  Care Management [place] for Problem One  Active  [facility] Long Term Goal   Member will not be readmitted to hospital within the next 31 days  [facility]N Long Term Goal Start Date  03/08/20  Interventions for Problem One Long Term Goal  Discussed with daughter importance of following plan of care, including PT,OT, and speech in effort to decrease risk of readmission  [facility] CM Short Term Goal #1   Member will complete follow up appointment with PCP within the next week  [facility] CM Short Term Goal #1 Start Date  03/08/20  [facility] CM Short Term Goal #1 Met Date  03/13/20  THN CM Short Term Goal #2   Member will have home health started within the next week  [facility] CM Short Term Goal #2 Start Date  03/08/20  [facility] CM Short Term Goal #2 Met Date  03/13/20  [facility]N CM Short Term Goal #3   Member/family will monitor and record daily weights over the next 2 weeks  [facility]N CM Short Term Goal #3 Start Date  03/13/20  Interventions for Short [place] Goal #3  Discussed importance of daily weights and fluid management especially with decrease in diuretic  [facility] CM Short Term Goal #4  Member will report taking medications as instructed over the next 2 weeks  [facility]N CM Short Term Goal #4 Start Date  03/13/20  Interventions for Short Term Goal #4  Medication changes reviewed with daughter      [name], RN, MSN [place] Manager [phone]

## 2020-03-14 DIAGNOSIS — J690 Pneumonitis due to inhalation of food and vomit: Secondary | ICD-10-CM | POA: Diagnosis not present

## 2020-03-14 DIAGNOSIS — N1831 Chronic kidney disease, stage 3a: Secondary | ICD-10-CM | POA: Diagnosis not present

## 2020-03-14 DIAGNOSIS — I0981 Rheumatic heart failure: Secondary | ICD-10-CM | POA: Diagnosis not present

## 2020-03-14 DIAGNOSIS — I130 Hypertensive heart and chronic kidney disease with heart failure and stage 1 through stage 4 chronic kidney disease, or unspecified chronic kidney disease: Secondary | ICD-10-CM | POA: Diagnosis not present

## 2020-03-14 DIAGNOSIS — A419 Sepsis, unspecified organism: Secondary | ICD-10-CM | POA: Diagnosis not present

## 2020-03-14 DIAGNOSIS — I5033 Acute on chronic diastolic (congestive) heart failure: Secondary | ICD-10-CM | POA: Diagnosis not present

## 2020-03-15 DIAGNOSIS — A419 Sepsis, unspecified organism: Secondary | ICD-10-CM | POA: Diagnosis not present

## 2020-03-15 DIAGNOSIS — I5033 Acute on chronic diastolic (congestive) heart failure: Secondary | ICD-10-CM | POA: Diagnosis not present

## 2020-03-15 DIAGNOSIS — I0981 Rheumatic heart failure: Secondary | ICD-10-CM | POA: Diagnosis not present

## 2020-03-15 DIAGNOSIS — N1831 Chronic kidney disease, stage 3a: Secondary | ICD-10-CM | POA: Diagnosis not present

## 2020-03-15 DIAGNOSIS — I130 Hypertensive heart and chronic kidney disease with heart failure and stage 1 through stage 4 chronic kidney disease, or unspecified chronic kidney disease: Secondary | ICD-10-CM | POA: Diagnosis not present

## 2020-03-15 DIAGNOSIS — J690 Pneumonitis due to inhalation of food and vomit: Secondary | ICD-10-CM | POA: Diagnosis not present

## 2020-03-16 DIAGNOSIS — I5033 Acute on chronic diastolic (congestive) heart failure: Secondary | ICD-10-CM | POA: Diagnosis not present

## 2020-03-16 DIAGNOSIS — J690 Pneumonitis due to inhalation of food and vomit: Secondary | ICD-10-CM | POA: Diagnosis not present

## 2020-03-16 DIAGNOSIS — A419 Sepsis, unspecified organism: Secondary | ICD-10-CM | POA: Diagnosis not present

## 2020-03-16 DIAGNOSIS — I0981 Rheumatic heart failure: Secondary | ICD-10-CM | POA: Diagnosis not present

## 2020-03-16 DIAGNOSIS — N1831 Chronic kidney disease, stage 3a: Secondary | ICD-10-CM | POA: Diagnosis not present

## 2020-03-16 DIAGNOSIS — I130 Hypertensive heart and chronic kidney disease with heart failure and stage 1 through stage 4 chronic kidney disease, or unspecified chronic kidney disease: Secondary | ICD-10-CM | POA: Diagnosis not present

## 2020-03-19 ENCOUNTER — Other Ambulatory Visit: Payer: Self-pay | Admitting: *Deleted

## 2020-03-19 NOTE — Patient Outreach (Signed)
[facility]) Care Management  03/19/2020  NYHEIM SEUFERT 12/30/1923 [id number]   Weekly transition of care call placed to member's daughter, [name], no answer. HIPAA compliant voice message left.  Will follow up within the next 3-4 business days.  [name], [place], MSN [place] [phone]

## 2020-03-20 DIAGNOSIS — I0981 Rheumatic heart failure: Secondary | ICD-10-CM | POA: Diagnosis not present

## 2020-03-20 DIAGNOSIS — J690 Pneumonitis due to inhalation of food and vomit: Secondary | ICD-10-CM | POA: Diagnosis not present

## 2020-03-20 DIAGNOSIS — A419 Sepsis, unspecified organism: Secondary | ICD-10-CM | POA: Diagnosis not present

## 2020-03-20 DIAGNOSIS — N1831 Chronic kidney disease, stage 3a: Secondary | ICD-10-CM | POA: Diagnosis not present

## 2020-03-20 DIAGNOSIS — I130 Hypertensive heart and chronic kidney disease with heart failure and stage 1 through stage 4 chronic kidney disease, or unspecified chronic kidney disease: Secondary | ICD-10-CM | POA: Diagnosis not present

## 2020-03-20 DIAGNOSIS — I5033 Acute on chronic diastolic (congestive) heart failure: Secondary | ICD-10-CM | POA: Diagnosis not present

## 2020-03-21 DIAGNOSIS — I0981 Rheumatic heart failure: Secondary | ICD-10-CM | POA: Diagnosis not present

## 2020-03-21 DIAGNOSIS — N1831 Chronic kidney disease, stage 3a: Secondary | ICD-10-CM | POA: Diagnosis not present

## 2020-03-21 DIAGNOSIS — I5033 Acute on chronic diastolic (congestive) heart failure: Secondary | ICD-10-CM | POA: Diagnosis not present

## 2020-03-21 DIAGNOSIS — I130 Hypertensive heart and chronic kidney disease with heart failure and stage 1 through stage 4 chronic kidney disease, or unspecified chronic kidney disease: Secondary | ICD-10-CM | POA: Diagnosis not present

## 2020-03-21 DIAGNOSIS — A419 Sepsis, unspecified organism: Secondary | ICD-10-CM | POA: Diagnosis not present

## 2020-03-21 DIAGNOSIS — J690 Pneumonitis due to inhalation of food and vomit: Secondary | ICD-10-CM | POA: Diagnosis not present

## 2020-03-22 DIAGNOSIS — I130 Hypertensive heart and chronic kidney disease with heart failure and stage 1 through stage 4 chronic kidney disease, or unspecified chronic kidney disease: Secondary | ICD-10-CM | POA: Diagnosis not present

## 2020-03-22 DIAGNOSIS — J690 Pneumonitis due to inhalation of food and vomit: Secondary | ICD-10-CM | POA: Diagnosis not present

## 2020-03-22 DIAGNOSIS — N1831 Chronic kidney disease, stage 3a: Secondary | ICD-10-CM | POA: Diagnosis not present

## 2020-03-22 DIAGNOSIS — I0981 Rheumatic heart failure: Secondary | ICD-10-CM | POA: Diagnosis not present

## 2020-03-22 DIAGNOSIS — I5033 Acute on chronic diastolic (congestive) heart failure: Secondary | ICD-10-CM | POA: Diagnosis not present

## 2020-03-22 DIAGNOSIS — A419 Sepsis, unspecified organism: Secondary | ICD-10-CM | POA: Diagnosis not present

## 2020-03-23 ENCOUNTER — Other Ambulatory Visit: Payer: Self-pay | Admitting: *Deleted

## 2020-03-23 NOTE — Patient Outreach (Signed)
[facility]) Care Management  03/23/2020  [name] Apr 14, 1924 [id number]   Weekly transition of care call placed to member's daughter.  State he is progressing well, was released from OT last week, remains active with PT.  He will have his last visit with speech today.  Report he has been breathing well, no complaints of shortness of breath or coughing with eating.  They continue to monitor blood pressure, weights, and oxygen levels daily, all are reported "good."  He has been tolerating his medication changes made by PCP during last visit, will have follow up on 7/22.  Daughter denies any urgent concerns, encouraged to contact this care manager with questions.  Will follow up within the next week.  [facility] CM Care Plan Problem One     Most Recent Value  Care Plan Problem One Risk for readmission related to complications of pneumonia as evidenced by recent hospital and SNF stay  Role Documenting the Problem One Care Management [place] for Problem One Active  [facility] Long Term Goal  Member will not be readmitted to hospital within the next 31 days  [facility]N Long Term Goal Start Date 03/08/20  Interventions for Problem One Long Term Goal Daughter educated on aspiration precautions and when to contact MD  [facility] CM Short Term Goal #3 Member/family will monitor and record daily weights over the next 2 weeks  THN CM Short Term Goal #3 Start Date 03/13/20  [facility] CM Short Term Goal #3 Met Date 03/23/20  THN CM Short Term Goal #4 Member will report taking medications as instructed over the next 2 weeks  THN CM Short Term Goal #4 Start Date 03/13/20  [facility] CM Short Term Goal #4 Met Date 03/23/20  Interventions for Short Term Goal #4 Medication changes reviewed with daughter     [name], RN, MSN [place] Manager [phone]

## 2020-03-24 DIAGNOSIS — I0981 Rheumatic heart failure: Secondary | ICD-10-CM | POA: Diagnosis not present

## 2020-03-24 DIAGNOSIS — J690 Pneumonitis due to inhalation of food and vomit: Secondary | ICD-10-CM | POA: Diagnosis not present

## 2020-03-24 DIAGNOSIS — A419 Sepsis, unspecified organism: Secondary | ICD-10-CM | POA: Diagnosis not present

## 2020-03-24 DIAGNOSIS — I5033 Acute on chronic diastolic (congestive) heart failure: Secondary | ICD-10-CM | POA: Diagnosis not present

## 2020-03-24 DIAGNOSIS — I130 Hypertensive heart and chronic kidney disease with heart failure and stage 1 through stage 4 chronic kidney disease, or unspecified chronic kidney disease: Secondary | ICD-10-CM | POA: Diagnosis not present

## 2020-03-24 DIAGNOSIS — N1831 Chronic kidney disease, stage 3a: Secondary | ICD-10-CM | POA: Diagnosis not present

## 2020-03-27 DIAGNOSIS — I5033 Acute on chronic diastolic (congestive) heart failure: Secondary | ICD-10-CM | POA: Diagnosis not present

## 2020-03-27 DIAGNOSIS — A419 Sepsis, unspecified organism: Secondary | ICD-10-CM | POA: Diagnosis not present

## 2020-03-27 DIAGNOSIS — I0981 Rheumatic heart failure: Secondary | ICD-10-CM | POA: Diagnosis not present

## 2020-03-27 DIAGNOSIS — I130 Hypertensive heart and chronic kidney disease with heart failure and stage 1 through stage 4 chronic kidney disease, or unspecified chronic kidney disease: Secondary | ICD-10-CM | POA: Diagnosis not present

## 2020-03-27 DIAGNOSIS — J690 Pneumonitis due to inhalation of food and vomit: Secondary | ICD-10-CM | POA: Diagnosis not present

## 2020-03-27 DIAGNOSIS — N1831 Chronic kidney disease, stage 3a: Secondary | ICD-10-CM | POA: Diagnosis not present

## 2020-03-29 DIAGNOSIS — I130 Hypertensive heart and chronic kidney disease with heart failure and stage 1 through stage 4 chronic kidney disease, or unspecified chronic kidney disease: Secondary | ICD-10-CM | POA: Diagnosis not present

## 2020-03-29 DIAGNOSIS — N1831 Chronic kidney disease, stage 3a: Secondary | ICD-10-CM | POA: Diagnosis not present

## 2020-03-29 DIAGNOSIS — I0981 Rheumatic heart failure: Secondary | ICD-10-CM | POA: Diagnosis not present

## 2020-03-29 DIAGNOSIS — J690 Pneumonitis due to inhalation of food and vomit: Secondary | ICD-10-CM | POA: Diagnosis not present

## 2020-03-29 DIAGNOSIS — A419 Sepsis, unspecified organism: Secondary | ICD-10-CM | POA: Diagnosis not present

## 2020-03-29 DIAGNOSIS — I5033 Acute on chronic diastolic (congestive) heart failure: Secondary | ICD-10-CM | POA: Diagnosis not present

## 2020-03-30 ENCOUNTER — Other Ambulatory Visit: Payer: Self-pay | Admitting: *Deleted

## 2020-03-30 NOTE — Patient Outreach (Signed)
[place] [facility]) Care Management  03/30/2020  [name] 11/16/1923 [id number]   Weekly transition of care call placed to daughter, no answer.  HIPAA compliant voice message left, will follow up within the next 3-4 business days.  [name], [place], MSN [place] [phone]

## 2020-04-03 DIAGNOSIS — N1831 Chronic kidney disease, stage 3a: Secondary | ICD-10-CM | POA: Diagnosis not present

## 2020-04-03 DIAGNOSIS — I130 Hypertensive heart and chronic kidney disease with heart failure and stage 1 through stage 4 chronic kidney disease, or unspecified chronic kidney disease: Secondary | ICD-10-CM | POA: Diagnosis not present

## 2020-04-03 DIAGNOSIS — A419 Sepsis, unspecified organism: Secondary | ICD-10-CM | POA: Diagnosis not present

## 2020-04-03 DIAGNOSIS — I5033 Acute on chronic diastolic (congestive) heart failure: Secondary | ICD-10-CM | POA: Diagnosis not present

## 2020-04-03 DIAGNOSIS — J690 Pneumonitis due to inhalation of food and vomit: Secondary | ICD-10-CM | POA: Diagnosis not present

## 2020-04-03 DIAGNOSIS — I0981 Rheumatic heart failure: Secondary | ICD-10-CM | POA: Diagnosis not present

## 2020-04-04 ENCOUNTER — Other Ambulatory Visit: Payer: Self-pay | Admitting: *Deleted

## 2020-04-04 NOTE — Patient Outreach (Signed)
[place] [facility]) Care Management  04/04/2020  [name] 26-Feb-1924 [id number]   Outreach attempt #2, unsuccessful.  Weekly transition of care call placed to daughter, no answer.  HIPAA compliant voice message left.  Will send unsuccessful outreach letter and follow up within the next 3-4 business days.  [name], [place], MSN [place] [phone]

## 2020-04-08 DIAGNOSIS — E43 Unspecified severe protein-calorie malnutrition: Secondary | ICD-10-CM | POA: Diagnosis not present

## 2020-04-08 DIAGNOSIS — D631 Anemia in chronic kidney disease: Secondary | ICD-10-CM | POA: Diagnosis not present

## 2020-04-08 DIAGNOSIS — K5730 Diverticulosis of large intestine without perforation or abscess without bleeding: Secondary | ICD-10-CM | POA: Diagnosis not present

## 2020-04-08 DIAGNOSIS — N281 Cyst of kidney, acquired: Secondary | ICD-10-CM | POA: Diagnosis not present

## 2020-04-08 DIAGNOSIS — I2510 Atherosclerotic heart disease of native coronary artery without angina pectoris: Secondary | ICD-10-CM | POA: Diagnosis not present

## 2020-04-08 DIAGNOSIS — N179 Acute kidney failure, unspecified: Secondary | ICD-10-CM | POA: Diagnosis not present

## 2020-04-08 DIAGNOSIS — I5033 Acute on chronic diastolic (congestive) heart failure: Secondary | ICD-10-CM | POA: Diagnosis not present

## 2020-04-08 DIAGNOSIS — I081 Rheumatic disorders of both mitral and tricuspid valves: Secondary | ICD-10-CM | POA: Diagnosis not present

## 2020-04-08 DIAGNOSIS — I0981 Rheumatic heart failure: Secondary | ICD-10-CM | POA: Diagnosis not present

## 2020-04-08 DIAGNOSIS — J9811 Atelectasis: Secondary | ICD-10-CM | POA: Diagnosis not present

## 2020-04-08 DIAGNOSIS — J9601 Acute respiratory failure with hypoxia: Secondary | ICD-10-CM | POA: Diagnosis not present

## 2020-04-08 DIAGNOSIS — N1831 Chronic kidney disease, stage 3a: Secondary | ICD-10-CM | POA: Diagnosis not present

## 2020-04-08 DIAGNOSIS — J320 Chronic maxillary sinusitis: Secondary | ICD-10-CM | POA: Diagnosis not present

## 2020-04-08 DIAGNOSIS — M170 Bilateral primary osteoarthritis of knee: Secondary | ICD-10-CM | POA: Diagnosis not present

## 2020-04-08 DIAGNOSIS — I480 Paroxysmal atrial fibrillation: Secondary | ICD-10-CM | POA: Diagnosis not present

## 2020-04-08 DIAGNOSIS — I4892 Unspecified atrial flutter: Secondary | ICD-10-CM | POA: Diagnosis not present

## 2020-04-08 DIAGNOSIS — A419 Sepsis, unspecified organism: Secondary | ICD-10-CM | POA: Diagnosis not present

## 2020-04-08 DIAGNOSIS — I700 Atherosclerosis of aorta: Secondary | ICD-10-CM | POA: Diagnosis not present

## 2020-04-08 DIAGNOSIS — M479 Spondylosis, unspecified: Secondary | ICD-10-CM | POA: Diagnosis not present

## 2020-04-08 DIAGNOSIS — J690 Pneumonitis due to inhalation of food and vomit: Secondary | ICD-10-CM | POA: Diagnosis not present

## 2020-04-08 DIAGNOSIS — F0280 Dementia in other diseases classified elsewhere without behavioral disturbance: Secondary | ICD-10-CM | POA: Diagnosis not present

## 2020-04-08 DIAGNOSIS — I130 Hypertensive heart and chronic kidney disease with heart failure and stage 1 through stage 4 chronic kidney disease, or unspecified chronic kidney disease: Secondary | ICD-10-CM | POA: Diagnosis not present

## 2020-04-08 DIAGNOSIS — G309 Alzheimer's disease, unspecified: Secondary | ICD-10-CM | POA: Diagnosis not present

## 2020-04-08 DIAGNOSIS — E039 Hypothyroidism, unspecified: Secondary | ICD-10-CM | POA: Diagnosis not present

## 2020-04-08 DIAGNOSIS — K4090 Unilateral inguinal hernia, without obstruction or gangrene, not specified as recurrent: Secondary | ICD-10-CM | POA: Diagnosis not present

## 2020-04-10 ENCOUNTER — Other Ambulatory Visit: Payer: Self-pay | Admitting: *Deleted

## 2020-04-10 DIAGNOSIS — I5033 Acute on chronic diastolic (congestive) heart failure: Secondary | ICD-10-CM | POA: Diagnosis not present

## 2020-04-10 DIAGNOSIS — N1831 Chronic kidney disease, stage 3a: Secondary | ICD-10-CM | POA: Diagnosis not present

## 2020-04-10 DIAGNOSIS — I0981 Rheumatic heart failure: Secondary | ICD-10-CM | POA: Diagnosis not present

## 2020-04-10 DIAGNOSIS — A419 Sepsis, unspecified organism: Secondary | ICD-10-CM | POA: Diagnosis not present

## 2020-04-10 DIAGNOSIS — I130 Hypertensive heart and chronic kidney disease with heart failure and stage 1 through stage 4 chronic kidney disease, or unspecified chronic kidney disease: Secondary | ICD-10-CM | POA: Diagnosis not present

## 2020-04-10 DIAGNOSIS — J690 Pneumonitis due to inhalation of food and vomit: Secondary | ICD-10-CM | POA: Diagnosis not present

## 2020-04-10 NOTE — Patient Outreach (Signed)
[place] [facility]) Care Management  04/10/2020  [name] 1924/09/02 [id number]   Outreach attempt #3, unsuccessful.  Weekly transition of care call placed to daughter [name], HIPAA compliant voice message left.  Will follow up within the next 3 weeks per workflow.  If remain unsuccessful, will close case at that time.  [name], [place], MSN [place] [phone]

## 2020-04-17 DIAGNOSIS — I0981 Rheumatic heart failure: Secondary | ICD-10-CM | POA: Diagnosis not present

## 2020-04-17 DIAGNOSIS — I5033 Acute on chronic diastolic (congestive) heart failure: Secondary | ICD-10-CM | POA: Diagnosis not present

## 2020-04-17 DIAGNOSIS — J690 Pneumonitis due to inhalation of food and vomit: Secondary | ICD-10-CM | POA: Diagnosis not present

## 2020-04-17 DIAGNOSIS — N1831 Chronic kidney disease, stage 3a: Secondary | ICD-10-CM | POA: Diagnosis not present

## 2020-04-17 DIAGNOSIS — A419 Sepsis, unspecified organism: Secondary | ICD-10-CM | POA: Diagnosis not present

## 2020-04-17 DIAGNOSIS — I130 Hypertensive heart and chronic kidney disease with heart failure and stage 1 through stage 4 chronic kidney disease, or unspecified chronic kidney disease: Secondary | ICD-10-CM | POA: Diagnosis not present

## 2020-04-19 DIAGNOSIS — N1831 Chronic kidney disease, stage 3a: Secondary | ICD-10-CM | POA: Diagnosis not present

## 2020-04-19 DIAGNOSIS — I0981 Rheumatic heart failure: Secondary | ICD-10-CM | POA: Diagnosis not present

## 2020-04-19 DIAGNOSIS — A419 Sepsis, unspecified organism: Secondary | ICD-10-CM | POA: Diagnosis not present

## 2020-04-19 DIAGNOSIS — I130 Hypertensive heart and chronic kidney disease with heart failure and stage 1 through stage 4 chronic kidney disease, or unspecified chronic kidney disease: Secondary | ICD-10-CM | POA: Diagnosis not present

## 2020-04-19 DIAGNOSIS — I5033 Acute on chronic diastolic (congestive) heart failure: Secondary | ICD-10-CM | POA: Diagnosis not present

## 2020-04-19 DIAGNOSIS — J690 Pneumonitis due to inhalation of food and vomit: Secondary | ICD-10-CM | POA: Diagnosis not present

## 2020-04-26 DIAGNOSIS — E1122 Type 2 diabetes mellitus with diabetic chronic kidney disease: Secondary | ICD-10-CM | POA: Diagnosis not present

## 2020-04-26 DIAGNOSIS — I130 Hypertensive heart and chronic kidney disease with heart failure and stage 1 through stage 4 chronic kidney disease, or unspecified chronic kidney disease: Secondary | ICD-10-CM | POA: Diagnosis not present

## 2020-04-26 DIAGNOSIS — I5033 Acute on chronic diastolic (congestive) heart failure: Secondary | ICD-10-CM | POA: Diagnosis not present

## 2020-04-26 DIAGNOSIS — F0390 Unspecified dementia without behavioral disturbance: Secondary | ICD-10-CM | POA: Diagnosis not present

## 2020-04-26 DIAGNOSIS — E039 Hypothyroidism, unspecified: Secondary | ICD-10-CM | POA: Diagnosis not present

## 2020-04-26 DIAGNOSIS — N1831 Chronic kidney disease, stage 3a: Secondary | ICD-10-CM | POA: Diagnosis not present

## 2020-04-26 DIAGNOSIS — I0981 Rheumatic heart failure: Secondary | ICD-10-CM | POA: Diagnosis not present

## 2020-04-26 DIAGNOSIS — I110 Hypertensive heart disease with heart failure: Secondary | ICD-10-CM | POA: Diagnosis not present

## 2020-04-26 DIAGNOSIS — E46 Unspecified protein-calorie malnutrition: Secondary | ICD-10-CM | POA: Diagnosis not present

## 2020-04-26 DIAGNOSIS — A419 Sepsis, unspecified organism: Secondary | ICD-10-CM | POA: Diagnosis not present

## 2020-04-26 DIAGNOSIS — N182 Chronic kidney disease, stage 2 (mild): Secondary | ICD-10-CM | POA: Diagnosis not present

## 2020-04-26 DIAGNOSIS — J690 Pneumonitis due to inhalation of food and vomit: Secondary | ICD-10-CM | POA: Diagnosis not present

## 2020-04-26 DIAGNOSIS — I5030 Unspecified diastolic (congestive) heart failure: Secondary | ICD-10-CM | POA: Diagnosis not present

## 2020-05-01 ENCOUNTER — Other Ambulatory Visit: Payer: Self-pay | Admitting: *Deleted

## 2020-05-01 NOTE — Patient Outreach (Signed)
[place] [facility]) Care Management  05/01/2020  [name] [birth year] [id number]   Outreach attempt #4, unsuccessful to daughter, HIPAA compliant voice message left.  No response from member after multiple unsuccessful outreach attempts and letter sent.  Will close case at this time due to inability to maintain contact.  Will notify member and primary MD of case closure.  [name], [place], MSN [place] [phone]

## 2020-05-29 ENCOUNTER — Other Ambulatory Visit: Payer: Self-pay | Admitting: Interventional Cardiology

## 2020-05-29 NOTE — Telephone Encounter (Signed)
Last OV 01/20/20 Scr 1.2 on 02/15/20 86 years old 71kg   Per dosing criteria pt is on incorrect dose. Per the last refill note from [place] on 11/03/2018:a message was sent to Dr. Tamala Julian on 01/27/17 regarding this and included the pt's hx of peptic ulcer disease with bleeding on 01/27/17 and he stated that the pt should remain on Eliquis 2.5mg .  Will send in refill to requested pharmacy.

## 2020-07-20 ENCOUNTER — Ambulatory Visit: Payer: Medicare Other | Admitting: Cardiology

## 2020-08-03 DIAGNOSIS — I5030 Unspecified diastolic (congestive) heart failure: Secondary | ICD-10-CM | POA: Diagnosis not present

## 2020-08-03 DIAGNOSIS — I110 Hypertensive heart disease with heart failure: Secondary | ICD-10-CM | POA: Diagnosis not present

## 2020-08-03 DIAGNOSIS — F0390 Unspecified dementia without behavioral disturbance: Secondary | ICD-10-CM | POA: Diagnosis not present

## 2020-08-03 DIAGNOSIS — I482 Chronic atrial fibrillation, unspecified: Secondary | ICD-10-CM | POA: Diagnosis not present

## 2020-08-03 DIAGNOSIS — E1122 Type 2 diabetes mellitus with diabetic chronic kidney disease: Secondary | ICD-10-CM | POA: Diagnosis not present

## 2020-08-03 DIAGNOSIS — E46 Unspecified protein-calorie malnutrition: Secondary | ICD-10-CM | POA: Diagnosis not present

## 2020-08-03 DIAGNOSIS — E039 Hypothyroidism, unspecified: Secondary | ICD-10-CM | POA: Diagnosis not present

## 2020-08-10 ENCOUNTER — Ambulatory Visit: Payer: Medicare Other | Admitting: Cardiology

## 2020-12-07 DIAGNOSIS — E785 Hyperlipidemia, unspecified: Secondary | ICD-10-CM | POA: Diagnosis not present

## 2020-12-07 DIAGNOSIS — I25709 Atherosclerosis of coronary artery bypass graft(s), unspecified, with unspecified angina pectoris: Secondary | ICD-10-CM | POA: Diagnosis not present

## 2020-12-07 DIAGNOSIS — E039 Hypothyroidism, unspecified: Secondary | ICD-10-CM | POA: Diagnosis not present

## 2020-12-07 DIAGNOSIS — J690 Pneumonitis due to inhalation of food and vomit: Secondary | ICD-10-CM | POA: Diagnosis not present

## 2020-12-07 DIAGNOSIS — R630 Anorexia: Secondary | ICD-10-CM | POA: Diagnosis not present

## 2020-12-07 DIAGNOSIS — I4891 Unspecified atrial fibrillation: Secondary | ICD-10-CM | POA: Diagnosis not present

## 2020-12-07 DIAGNOSIS — E119 Type 2 diabetes mellitus without complications: Secondary | ICD-10-CM | POA: Diagnosis not present

## 2020-12-07 DIAGNOSIS — K279 Peptic ulcer, site unspecified, unspecified as acute or chronic, without hemorrhage or perforation: Secondary | ICD-10-CM | POA: Diagnosis not present

## 2020-12-07 DIAGNOSIS — K219 Gastro-esophageal reflux disease without esophagitis: Secondary | ICD-10-CM | POA: Diagnosis not present

## 2020-12-07 DIAGNOSIS — I110 Hypertensive heart disease with heart failure: Secondary | ICD-10-CM | POA: Diagnosis not present

## 2020-12-07 DIAGNOSIS — F0390 Unspecified dementia without behavioral disturbance: Secondary | ICD-10-CM | POA: Diagnosis not present

## 2020-12-07 DIAGNOSIS — N400 Enlarged prostate without lower urinary tract symptoms: Secondary | ICD-10-CM | POA: Diagnosis not present

## 2020-12-07 DIAGNOSIS — Z8719 Personal history of other diseases of the digestive system: Secondary | ICD-10-CM | POA: Diagnosis not present

## 2020-12-07 DIAGNOSIS — I509 Heart failure, unspecified: Secondary | ICD-10-CM | POA: Diagnosis not present

## 2020-12-10 DIAGNOSIS — E785 Hyperlipidemia, unspecified: Secondary | ICD-10-CM | POA: Diagnosis not present

## 2020-12-10 DIAGNOSIS — I4891 Unspecified atrial fibrillation: Secondary | ICD-10-CM | POA: Diagnosis not present

## 2020-12-10 DIAGNOSIS — R630 Anorexia: Secondary | ICD-10-CM | POA: Diagnosis not present

## 2020-12-10 DIAGNOSIS — I110 Hypertensive heart disease with heart failure: Secondary | ICD-10-CM | POA: Diagnosis not present

## 2020-12-10 DIAGNOSIS — I509 Heart failure, unspecified: Secondary | ICD-10-CM | POA: Diagnosis not present

## 2020-12-10 DIAGNOSIS — F0390 Unspecified dementia without behavioral disturbance: Secondary | ICD-10-CM | POA: Diagnosis not present

## 2020-12-13 DIAGNOSIS — F0390 Unspecified dementia without behavioral disturbance: Secondary | ICD-10-CM | POA: Diagnosis not present

## 2020-12-13 DIAGNOSIS — I110 Hypertensive heart disease with heart failure: Secondary | ICD-10-CM | POA: Diagnosis not present

## 2020-12-13 DIAGNOSIS — R630 Anorexia: Secondary | ICD-10-CM | POA: Diagnosis not present

## 2020-12-13 DIAGNOSIS — E785 Hyperlipidemia, unspecified: Secondary | ICD-10-CM | POA: Diagnosis not present

## 2020-12-13 DIAGNOSIS — I4891 Unspecified atrial fibrillation: Secondary | ICD-10-CM | POA: Diagnosis not present

## 2020-12-13 DIAGNOSIS — I509 Heart failure, unspecified: Secondary | ICD-10-CM | POA: Diagnosis not present

## 2020-12-17 ENCOUNTER — Telehealth: Payer: Self-pay | Admitting: Interventional Cardiology

## 2020-12-17 NOTE — Telephone Encounter (Signed)
Pt c/o medication issue:  1. Name of Medication: ELIQUIS 2.5 MG TABS tablet  2. How are you currently taking this medication (dosage and times per day)? 1 tablet twice a day  3. Are you having a reaction (difficulty breathing--STAT)? no  4. What is your medication issue? [name] with [facility] states the patient entered hospice care 2 weeks ago. She states he needs to be changed over to aspirin or coumadin. He states the patient's PCP, Dr. Brigitte Pulse, wants him to take aspirin 3.25 mg daily, but they need an okay from Dr. Tamala Julian.

## 2020-12-17 NOTE — Telephone Encounter (Signed)
Will route to Dr. Tamala Julian to review changing pt from Eliquis to ASA 325mg ?

## 2020-12-18 DIAGNOSIS — I4891 Unspecified atrial fibrillation: Secondary | ICD-10-CM | POA: Diagnosis not present

## 2020-12-18 DIAGNOSIS — E785 Hyperlipidemia, unspecified: Secondary | ICD-10-CM | POA: Diagnosis not present

## 2020-12-18 DIAGNOSIS — F0390 Unspecified dementia without behavioral disturbance: Secondary | ICD-10-CM | POA: Diagnosis not present

## 2020-12-18 DIAGNOSIS — I110 Hypertensive heart disease with heart failure: Secondary | ICD-10-CM | POA: Diagnosis not present

## 2020-12-18 DIAGNOSIS — R630 Anorexia: Secondary | ICD-10-CM | POA: Diagnosis not present

## 2020-12-18 DIAGNOSIS — I509 Heart failure, unspecified: Secondary | ICD-10-CM | POA: Diagnosis not present

## 2020-12-19 NOTE — Telephone Encounter (Signed)
Spoke with Aaron Edelman and made him aware of information from Dr. Tamala Julian.  Aaron Edelman appreciative for call.

## 2020-12-19 NOTE — Telephone Encounter (Signed)
Eliquis is for stroke prevention. If PCP and family feel this is no longer important, I agree with discontinuation.

## 2020-12-25 DIAGNOSIS — I110 Hypertensive heart disease with heart failure: Secondary | ICD-10-CM | POA: Diagnosis not present

## 2020-12-25 DIAGNOSIS — F0390 Unspecified dementia without behavioral disturbance: Secondary | ICD-10-CM | POA: Diagnosis not present

## 2020-12-25 DIAGNOSIS — I4891 Unspecified atrial fibrillation: Secondary | ICD-10-CM | POA: Diagnosis not present

## 2020-12-25 DIAGNOSIS — I509 Heart failure, unspecified: Secondary | ICD-10-CM | POA: Diagnosis not present

## 2020-12-25 DIAGNOSIS — E785 Hyperlipidemia, unspecified: Secondary | ICD-10-CM | POA: Diagnosis not present

## 2020-12-25 DIAGNOSIS — R630 Anorexia: Secondary | ICD-10-CM | POA: Diagnosis not present

## 2020-12-27 DIAGNOSIS — R630 Anorexia: Secondary | ICD-10-CM | POA: Diagnosis not present

## 2020-12-27 DIAGNOSIS — I4891 Unspecified atrial fibrillation: Secondary | ICD-10-CM | POA: Diagnosis not present

## 2020-12-27 DIAGNOSIS — I509 Heart failure, unspecified: Secondary | ICD-10-CM | POA: Diagnosis not present

## 2020-12-27 DIAGNOSIS — I110 Hypertensive heart disease with heart failure: Secondary | ICD-10-CM | POA: Diagnosis not present

## 2020-12-27 DIAGNOSIS — F0390 Unspecified dementia without behavioral disturbance: Secondary | ICD-10-CM | POA: Diagnosis not present

## 2020-12-27 DIAGNOSIS — E785 Hyperlipidemia, unspecified: Secondary | ICD-10-CM | POA: Diagnosis not present

## 2020-12-31 DIAGNOSIS — F0390 Unspecified dementia without behavioral disturbance: Secondary | ICD-10-CM | POA: Diagnosis not present

## 2020-12-31 DIAGNOSIS — I110 Hypertensive heart disease with heart failure: Secondary | ICD-10-CM | POA: Diagnosis not present

## 2020-12-31 DIAGNOSIS — E785 Hyperlipidemia, unspecified: Secondary | ICD-10-CM | POA: Diagnosis not present

## 2020-12-31 DIAGNOSIS — I509 Heart failure, unspecified: Secondary | ICD-10-CM | POA: Diagnosis not present

## 2020-12-31 DIAGNOSIS — R630 Anorexia: Secondary | ICD-10-CM | POA: Diagnosis not present

## 2020-12-31 DIAGNOSIS — I4891 Unspecified atrial fibrillation: Secondary | ICD-10-CM | POA: Diagnosis not present

## 2021-01-04 DIAGNOSIS — E785 Hyperlipidemia, unspecified: Secondary | ICD-10-CM | POA: Diagnosis not present

## 2021-01-04 DIAGNOSIS — E119 Type 2 diabetes mellitus without complications: Secondary | ICD-10-CM | POA: Diagnosis not present

## 2021-01-04 DIAGNOSIS — J690 Pneumonitis due to inhalation of food and vomit: Secondary | ICD-10-CM | POA: Diagnosis not present

## 2021-01-04 DIAGNOSIS — R630 Anorexia: Secondary | ICD-10-CM | POA: Diagnosis not present

## 2021-01-04 DIAGNOSIS — E039 Hypothyroidism, unspecified: Secondary | ICD-10-CM | POA: Diagnosis not present

## 2021-01-04 DIAGNOSIS — I482 Chronic atrial fibrillation, unspecified: Secondary | ICD-10-CM | POA: Diagnosis not present

## 2021-01-04 DIAGNOSIS — I4891 Unspecified atrial fibrillation: Secondary | ICD-10-CM | POA: Diagnosis not present

## 2021-01-04 DIAGNOSIS — Z8719 Personal history of other diseases of the digestive system: Secondary | ICD-10-CM | POA: Diagnosis not present

## 2021-01-04 DIAGNOSIS — I25709 Atherosclerosis of coronary artery bypass graft(s), unspecified, with unspecified angina pectoris: Secondary | ICD-10-CM | POA: Diagnosis not present

## 2021-01-04 DIAGNOSIS — I110 Hypertensive heart disease with heart failure: Secondary | ICD-10-CM | POA: Diagnosis not present

## 2021-01-04 DIAGNOSIS — F0390 Unspecified dementia without behavioral disturbance: Secondary | ICD-10-CM | POA: Diagnosis not present

## 2021-01-04 DIAGNOSIS — R4189 Other symptoms and signs involving cognitive functions and awareness: Secondary | ICD-10-CM | POA: Diagnosis not present

## 2021-01-04 DIAGNOSIS — N182 Chronic kidney disease, stage 2 (mild): Secondary | ICD-10-CM | POA: Diagnosis not present

## 2021-01-04 DIAGNOSIS — I509 Heart failure, unspecified: Secondary | ICD-10-CM | POA: Diagnosis not present

## 2021-01-04 DIAGNOSIS — D6869 Other thrombophilia: Secondary | ICD-10-CM | POA: Diagnosis not present

## 2021-01-04 DIAGNOSIS — N400 Enlarged prostate without lower urinary tract symptoms: Secondary | ICD-10-CM | POA: Diagnosis not present

## 2021-01-04 DIAGNOSIS — K279 Peptic ulcer, site unspecified, unspecified as acute or chronic, without hemorrhage or perforation: Secondary | ICD-10-CM | POA: Diagnosis not present

## 2021-01-04 DIAGNOSIS — Z952 Presence of prosthetic heart valve: Secondary | ICD-10-CM | POA: Diagnosis not present

## 2021-01-04 DIAGNOSIS — K219 Gastro-esophageal reflux disease without esophagitis: Secondary | ICD-10-CM | POA: Diagnosis not present

## 2021-01-04 DIAGNOSIS — I5030 Unspecified diastolic (congestive) heart failure: Secondary | ICD-10-CM | POA: Diagnosis not present

## 2021-01-04 DIAGNOSIS — E210 Primary hyperparathyroidism: Secondary | ICD-10-CM | POA: Diagnosis not present

## 2021-01-04 DIAGNOSIS — E46 Unspecified protein-calorie malnutrition: Secondary | ICD-10-CM | POA: Diagnosis not present

## 2021-01-04 DIAGNOSIS — Z0000 Encounter for general adult medical examination without abnormal findings: Secondary | ICD-10-CM | POA: Diagnosis not present

## 2021-01-04 DIAGNOSIS — E1122 Type 2 diabetes mellitus with diabetic chronic kidney disease: Secondary | ICD-10-CM | POA: Diagnosis not present

## 2021-01-08 DIAGNOSIS — E785 Hyperlipidemia, unspecified: Secondary | ICD-10-CM | POA: Diagnosis not present

## 2021-01-08 DIAGNOSIS — I110 Hypertensive heart disease with heart failure: Secondary | ICD-10-CM | POA: Diagnosis not present

## 2021-01-08 DIAGNOSIS — I509 Heart failure, unspecified: Secondary | ICD-10-CM | POA: Diagnosis not present

## 2021-01-08 DIAGNOSIS — R630 Anorexia: Secondary | ICD-10-CM | POA: Diagnosis not present

## 2021-01-08 DIAGNOSIS — I4891 Unspecified atrial fibrillation: Secondary | ICD-10-CM | POA: Diagnosis not present

## 2021-01-08 DIAGNOSIS — F0390 Unspecified dementia without behavioral disturbance: Secondary | ICD-10-CM | POA: Diagnosis not present

## 2021-01-09 DIAGNOSIS — R630 Anorexia: Secondary | ICD-10-CM | POA: Diagnosis not present

## 2021-01-09 DIAGNOSIS — E785 Hyperlipidemia, unspecified: Secondary | ICD-10-CM | POA: Diagnosis not present

## 2021-01-09 DIAGNOSIS — I110 Hypertensive heart disease with heart failure: Secondary | ICD-10-CM | POA: Diagnosis not present

## 2021-01-09 DIAGNOSIS — I4891 Unspecified atrial fibrillation: Secondary | ICD-10-CM | POA: Diagnosis not present

## 2021-01-09 DIAGNOSIS — I509 Heart failure, unspecified: Secondary | ICD-10-CM | POA: Diagnosis not present

## 2021-01-09 DIAGNOSIS — F0390 Unspecified dementia without behavioral disturbance: Secondary | ICD-10-CM | POA: Diagnosis not present

## 2021-01-11 DIAGNOSIS — E785 Hyperlipidemia, unspecified: Secondary | ICD-10-CM | POA: Diagnosis not present

## 2021-01-11 DIAGNOSIS — F0390 Unspecified dementia without behavioral disturbance: Secondary | ICD-10-CM | POA: Diagnosis not present

## 2021-01-11 DIAGNOSIS — R630 Anorexia: Secondary | ICD-10-CM | POA: Diagnosis not present

## 2021-01-11 DIAGNOSIS — I509 Heart failure, unspecified: Secondary | ICD-10-CM | POA: Diagnosis not present

## 2021-01-11 DIAGNOSIS — I110 Hypertensive heart disease with heart failure: Secondary | ICD-10-CM | POA: Diagnosis not present

## 2021-01-11 DIAGNOSIS — I4891 Unspecified atrial fibrillation: Secondary | ICD-10-CM | POA: Diagnosis not present

## 2021-01-14 DIAGNOSIS — I110 Hypertensive heart disease with heart failure: Secondary | ICD-10-CM | POA: Diagnosis not present

## 2021-01-14 DIAGNOSIS — R630 Anorexia: Secondary | ICD-10-CM | POA: Diagnosis not present

## 2021-01-14 DIAGNOSIS — E785 Hyperlipidemia, unspecified: Secondary | ICD-10-CM | POA: Diagnosis not present

## 2021-01-14 DIAGNOSIS — I509 Heart failure, unspecified: Secondary | ICD-10-CM | POA: Diagnosis not present

## 2021-01-14 DIAGNOSIS — I4891 Unspecified atrial fibrillation: Secondary | ICD-10-CM | POA: Diagnosis not present

## 2021-01-14 DIAGNOSIS — F0390 Unspecified dementia without behavioral disturbance: Secondary | ICD-10-CM | POA: Diagnosis not present

## 2021-01-17 DIAGNOSIS — I110 Hypertensive heart disease with heart failure: Secondary | ICD-10-CM | POA: Diagnosis not present

## 2021-01-17 DIAGNOSIS — I4891 Unspecified atrial fibrillation: Secondary | ICD-10-CM | POA: Diagnosis not present

## 2021-01-17 DIAGNOSIS — E785 Hyperlipidemia, unspecified: Secondary | ICD-10-CM | POA: Diagnosis not present

## 2021-01-17 DIAGNOSIS — I509 Heart failure, unspecified: Secondary | ICD-10-CM | POA: Diagnosis not present

## 2021-01-17 DIAGNOSIS — F0390 Unspecified dementia without behavioral disturbance: Secondary | ICD-10-CM | POA: Diagnosis not present

## 2021-01-17 DIAGNOSIS — R630 Anorexia: Secondary | ICD-10-CM | POA: Diagnosis not present

## 2021-01-22 DIAGNOSIS — I110 Hypertensive heart disease with heart failure: Secondary | ICD-10-CM | POA: Diagnosis not present

## 2021-01-22 DIAGNOSIS — R630 Anorexia: Secondary | ICD-10-CM | POA: Diagnosis not present

## 2021-01-22 DIAGNOSIS — E785 Hyperlipidemia, unspecified: Secondary | ICD-10-CM | POA: Diagnosis not present

## 2021-01-22 DIAGNOSIS — F0390 Unspecified dementia without behavioral disturbance: Secondary | ICD-10-CM | POA: Diagnosis not present

## 2021-01-22 DIAGNOSIS — I509 Heart failure, unspecified: Secondary | ICD-10-CM | POA: Diagnosis not present

## 2021-01-22 DIAGNOSIS — I4891 Unspecified atrial fibrillation: Secondary | ICD-10-CM | POA: Diagnosis not present

## 2021-01-24 DIAGNOSIS — I4891 Unspecified atrial fibrillation: Secondary | ICD-10-CM | POA: Diagnosis not present

## 2021-01-24 DIAGNOSIS — I110 Hypertensive heart disease with heart failure: Secondary | ICD-10-CM | POA: Diagnosis not present

## 2021-01-24 DIAGNOSIS — R630 Anorexia: Secondary | ICD-10-CM | POA: Diagnosis not present

## 2021-01-24 DIAGNOSIS — E785 Hyperlipidemia, unspecified: Secondary | ICD-10-CM | POA: Diagnosis not present

## 2021-01-24 DIAGNOSIS — I509 Heart failure, unspecified: Secondary | ICD-10-CM | POA: Diagnosis not present

## 2021-01-24 DIAGNOSIS — F0390 Unspecified dementia without behavioral disturbance: Secondary | ICD-10-CM | POA: Diagnosis not present

## 2021-01-29 DIAGNOSIS — E785 Hyperlipidemia, unspecified: Secondary | ICD-10-CM | POA: Diagnosis not present

## 2021-01-29 DIAGNOSIS — R630 Anorexia: Secondary | ICD-10-CM | POA: Diagnosis not present

## 2021-01-29 DIAGNOSIS — I110 Hypertensive heart disease with heart failure: Secondary | ICD-10-CM | POA: Diagnosis not present

## 2021-01-29 DIAGNOSIS — F0390 Unspecified dementia without behavioral disturbance: Secondary | ICD-10-CM | POA: Diagnosis not present

## 2021-01-29 DIAGNOSIS — I4891 Unspecified atrial fibrillation: Secondary | ICD-10-CM | POA: Diagnosis not present

## 2021-01-29 DIAGNOSIS — I509 Heart failure, unspecified: Secondary | ICD-10-CM | POA: Diagnosis not present

## 2021-01-30 DIAGNOSIS — E785 Hyperlipidemia, unspecified: Secondary | ICD-10-CM | POA: Diagnosis not present

## 2021-01-30 DIAGNOSIS — I110 Hypertensive heart disease with heart failure: Secondary | ICD-10-CM | POA: Diagnosis not present

## 2021-01-30 DIAGNOSIS — I4891 Unspecified atrial fibrillation: Secondary | ICD-10-CM | POA: Diagnosis not present

## 2021-01-30 DIAGNOSIS — F0390 Unspecified dementia without behavioral disturbance: Secondary | ICD-10-CM | POA: Diagnosis not present

## 2021-01-30 DIAGNOSIS — R630 Anorexia: Secondary | ICD-10-CM | POA: Diagnosis not present

## 2021-01-30 DIAGNOSIS — I509 Heart failure, unspecified: Secondary | ICD-10-CM | POA: Diagnosis not present

## 2021-01-31 DIAGNOSIS — I110 Hypertensive heart disease with heart failure: Secondary | ICD-10-CM | POA: Diagnosis not present

## 2021-01-31 DIAGNOSIS — R630 Anorexia: Secondary | ICD-10-CM | POA: Diagnosis not present

## 2021-01-31 DIAGNOSIS — F0390 Unspecified dementia without behavioral disturbance: Secondary | ICD-10-CM | POA: Diagnosis not present

## 2021-01-31 DIAGNOSIS — I509 Heart failure, unspecified: Secondary | ICD-10-CM | POA: Diagnosis not present

## 2021-01-31 DIAGNOSIS — I4891 Unspecified atrial fibrillation: Secondary | ICD-10-CM | POA: Diagnosis not present

## 2021-01-31 DIAGNOSIS — E785 Hyperlipidemia, unspecified: Secondary | ICD-10-CM | POA: Diagnosis not present

## 2021-02-01 DIAGNOSIS — R630 Anorexia: Secondary | ICD-10-CM | POA: Diagnosis not present

## 2021-02-01 DIAGNOSIS — E785 Hyperlipidemia, unspecified: Secondary | ICD-10-CM | POA: Diagnosis not present

## 2021-02-01 DIAGNOSIS — I4891 Unspecified atrial fibrillation: Secondary | ICD-10-CM | POA: Diagnosis not present

## 2021-02-01 DIAGNOSIS — I110 Hypertensive heart disease with heart failure: Secondary | ICD-10-CM | POA: Diagnosis not present

## 2021-02-01 DIAGNOSIS — I509 Heart failure, unspecified: Secondary | ICD-10-CM | POA: Diagnosis not present

## 2021-02-01 DIAGNOSIS — F0390 Unspecified dementia without behavioral disturbance: Secondary | ICD-10-CM | POA: Diagnosis not present

## 2021-02-03 DIAGNOSIS — N400 Enlarged prostate without lower urinary tract symptoms: Secondary | ICD-10-CM | POA: Diagnosis not present

## 2021-02-03 DIAGNOSIS — I110 Hypertensive heart disease with heart failure: Secondary | ICD-10-CM | POA: Diagnosis not present

## 2021-02-03 DIAGNOSIS — R630 Anorexia: Secondary | ICD-10-CM | POA: Diagnosis not present

## 2021-02-03 DIAGNOSIS — E039 Hypothyroidism, unspecified: Secondary | ICD-10-CM | POA: Diagnosis not present

## 2021-02-03 DIAGNOSIS — K279 Peptic ulcer, site unspecified, unspecified as acute or chronic, without hemorrhage or perforation: Secondary | ICD-10-CM | POA: Diagnosis not present

## 2021-02-03 DIAGNOSIS — K219 Gastro-esophageal reflux disease without esophagitis: Secondary | ICD-10-CM | POA: Diagnosis not present

## 2021-02-03 DIAGNOSIS — J690 Pneumonitis due to inhalation of food and vomit: Secondary | ICD-10-CM | POA: Diagnosis not present

## 2021-02-03 DIAGNOSIS — I509 Heart failure, unspecified: Secondary | ICD-10-CM | POA: Diagnosis not present

## 2021-02-03 DIAGNOSIS — I25709 Atherosclerosis of coronary artery bypass graft(s), unspecified, with unspecified angina pectoris: Secondary | ICD-10-CM | POA: Diagnosis not present

## 2021-02-03 DIAGNOSIS — I4891 Unspecified atrial fibrillation: Secondary | ICD-10-CM | POA: Diagnosis not present

## 2021-02-03 DIAGNOSIS — E119 Type 2 diabetes mellitus without complications: Secondary | ICD-10-CM | POA: Diagnosis not present

## 2021-02-03 DIAGNOSIS — Z8719 Personal history of other diseases of the digestive system: Secondary | ICD-10-CM | POA: Diagnosis not present

## 2021-02-03 DIAGNOSIS — E785 Hyperlipidemia, unspecified: Secondary | ICD-10-CM | POA: Diagnosis not present

## 2021-02-03 DIAGNOSIS — F0390 Unspecified dementia without behavioral disturbance: Secondary | ICD-10-CM | POA: Diagnosis not present

## 2021-02-05 DIAGNOSIS — I4891 Unspecified atrial fibrillation: Secondary | ICD-10-CM | POA: Diagnosis not present

## 2021-02-05 DIAGNOSIS — F0390 Unspecified dementia without behavioral disturbance: Secondary | ICD-10-CM | POA: Diagnosis not present

## 2021-02-05 DIAGNOSIS — E785 Hyperlipidemia, unspecified: Secondary | ICD-10-CM | POA: Diagnosis not present

## 2021-02-05 DIAGNOSIS — I509 Heart failure, unspecified: Secondary | ICD-10-CM | POA: Diagnosis not present

## 2021-02-05 DIAGNOSIS — R630 Anorexia: Secondary | ICD-10-CM | POA: Diagnosis not present

## 2021-02-05 DIAGNOSIS — I110 Hypertensive heart disease with heart failure: Secondary | ICD-10-CM | POA: Diagnosis not present

## 2021-02-06 DIAGNOSIS — F0390 Unspecified dementia without behavioral disturbance: Secondary | ICD-10-CM | POA: Diagnosis not present

## 2021-02-06 DIAGNOSIS — I110 Hypertensive heart disease with heart failure: Secondary | ICD-10-CM | POA: Diagnosis not present

## 2021-02-06 DIAGNOSIS — I509 Heart failure, unspecified: Secondary | ICD-10-CM | POA: Diagnosis not present

## 2021-02-06 DIAGNOSIS — E785 Hyperlipidemia, unspecified: Secondary | ICD-10-CM | POA: Diagnosis not present

## 2021-02-06 DIAGNOSIS — R630 Anorexia: Secondary | ICD-10-CM | POA: Diagnosis not present

## 2021-02-06 DIAGNOSIS — I4891 Unspecified atrial fibrillation: Secondary | ICD-10-CM | POA: Diagnosis not present

## 2021-02-07 DIAGNOSIS — R630 Anorexia: Secondary | ICD-10-CM | POA: Diagnosis not present

## 2021-02-07 DIAGNOSIS — I110 Hypertensive heart disease with heart failure: Secondary | ICD-10-CM | POA: Diagnosis not present

## 2021-02-07 DIAGNOSIS — I509 Heart failure, unspecified: Secondary | ICD-10-CM | POA: Diagnosis not present

## 2021-02-07 DIAGNOSIS — F0390 Unspecified dementia without behavioral disturbance: Secondary | ICD-10-CM | POA: Diagnosis not present

## 2021-02-07 DIAGNOSIS — E785 Hyperlipidemia, unspecified: Secondary | ICD-10-CM | POA: Diagnosis not present

## 2021-02-07 DIAGNOSIS — I4891 Unspecified atrial fibrillation: Secondary | ICD-10-CM | POA: Diagnosis not present

## 2021-02-08 DIAGNOSIS — I110 Hypertensive heart disease with heart failure: Secondary | ICD-10-CM | POA: Diagnosis not present

## 2021-02-08 DIAGNOSIS — F0390 Unspecified dementia without behavioral disturbance: Secondary | ICD-10-CM | POA: Diagnosis not present

## 2021-02-08 DIAGNOSIS — I509 Heart failure, unspecified: Secondary | ICD-10-CM | POA: Diagnosis not present

## 2021-02-08 DIAGNOSIS — R630 Anorexia: Secondary | ICD-10-CM | POA: Diagnosis not present

## 2021-02-08 DIAGNOSIS — I4891 Unspecified atrial fibrillation: Secondary | ICD-10-CM | POA: Diagnosis not present

## 2021-02-08 DIAGNOSIS — E785 Hyperlipidemia, unspecified: Secondary | ICD-10-CM | POA: Diagnosis not present

## 2021-02-12 DIAGNOSIS — I4891 Unspecified atrial fibrillation: Secondary | ICD-10-CM | POA: Diagnosis not present

## 2021-02-12 DIAGNOSIS — E785 Hyperlipidemia, unspecified: Secondary | ICD-10-CM | POA: Diagnosis not present

## 2021-02-12 DIAGNOSIS — R630 Anorexia: Secondary | ICD-10-CM | POA: Diagnosis not present

## 2021-02-12 DIAGNOSIS — F0390 Unspecified dementia without behavioral disturbance: Secondary | ICD-10-CM | POA: Diagnosis not present

## 2021-02-12 DIAGNOSIS — I509 Heart failure, unspecified: Secondary | ICD-10-CM | POA: Diagnosis not present

## 2021-02-12 DIAGNOSIS — I110 Hypertensive heart disease with heart failure: Secondary | ICD-10-CM | POA: Diagnosis not present

## 2021-02-13 DIAGNOSIS — I110 Hypertensive heart disease with heart failure: Secondary | ICD-10-CM | POA: Diagnosis not present

## 2021-02-13 DIAGNOSIS — F0390 Unspecified dementia without behavioral disturbance: Secondary | ICD-10-CM | POA: Diagnosis not present

## 2021-02-13 DIAGNOSIS — E785 Hyperlipidemia, unspecified: Secondary | ICD-10-CM | POA: Diagnosis not present

## 2021-02-13 DIAGNOSIS — R630 Anorexia: Secondary | ICD-10-CM | POA: Diagnosis not present

## 2021-02-13 DIAGNOSIS — I509 Heart failure, unspecified: Secondary | ICD-10-CM | POA: Diagnosis not present

## 2021-02-13 DIAGNOSIS — I4891 Unspecified atrial fibrillation: Secondary | ICD-10-CM | POA: Diagnosis not present

## 2021-02-14 DIAGNOSIS — R630 Anorexia: Secondary | ICD-10-CM | POA: Diagnosis not present

## 2021-02-14 DIAGNOSIS — E785 Hyperlipidemia, unspecified: Secondary | ICD-10-CM | POA: Diagnosis not present

## 2021-02-14 DIAGNOSIS — F0390 Unspecified dementia without behavioral disturbance: Secondary | ICD-10-CM | POA: Diagnosis not present

## 2021-02-14 DIAGNOSIS — I4891 Unspecified atrial fibrillation: Secondary | ICD-10-CM | POA: Diagnosis not present

## 2021-02-14 DIAGNOSIS — I110 Hypertensive heart disease with heart failure: Secondary | ICD-10-CM | POA: Diagnosis not present

## 2021-02-14 DIAGNOSIS — I509 Heart failure, unspecified: Secondary | ICD-10-CM | POA: Diagnosis not present

## 2021-02-15 DIAGNOSIS — I110 Hypertensive heart disease with heart failure: Secondary | ICD-10-CM | POA: Diagnosis not present

## 2021-02-15 DIAGNOSIS — E785 Hyperlipidemia, unspecified: Secondary | ICD-10-CM | POA: Diagnosis not present

## 2021-02-15 DIAGNOSIS — I509 Heart failure, unspecified: Secondary | ICD-10-CM | POA: Diagnosis not present

## 2021-02-15 DIAGNOSIS — I4891 Unspecified atrial fibrillation: Secondary | ICD-10-CM | POA: Diagnosis not present

## 2021-02-15 DIAGNOSIS — F0390 Unspecified dementia without behavioral disturbance: Secondary | ICD-10-CM | POA: Diagnosis not present

## 2021-02-15 DIAGNOSIS — R630 Anorexia: Secondary | ICD-10-CM | POA: Diagnosis not present

## 2021-02-19 DIAGNOSIS — I509 Heart failure, unspecified: Secondary | ICD-10-CM | POA: Diagnosis not present

## 2021-02-19 DIAGNOSIS — I110 Hypertensive heart disease with heart failure: Secondary | ICD-10-CM | POA: Diagnosis not present

## 2021-02-19 DIAGNOSIS — E785 Hyperlipidemia, unspecified: Secondary | ICD-10-CM | POA: Diagnosis not present

## 2021-02-19 DIAGNOSIS — R630 Anorexia: Secondary | ICD-10-CM | POA: Diagnosis not present

## 2021-02-19 DIAGNOSIS — F0390 Unspecified dementia without behavioral disturbance: Secondary | ICD-10-CM | POA: Diagnosis not present

## 2021-02-19 DIAGNOSIS — I4891 Unspecified atrial fibrillation: Secondary | ICD-10-CM | POA: Diagnosis not present

## 2021-02-20 DIAGNOSIS — I509 Heart failure, unspecified: Secondary | ICD-10-CM | POA: Diagnosis not present

## 2021-02-20 DIAGNOSIS — I4891 Unspecified atrial fibrillation: Secondary | ICD-10-CM | POA: Diagnosis not present

## 2021-02-20 DIAGNOSIS — E785 Hyperlipidemia, unspecified: Secondary | ICD-10-CM | POA: Diagnosis not present

## 2021-02-20 DIAGNOSIS — R630 Anorexia: Secondary | ICD-10-CM | POA: Diagnosis not present

## 2021-02-20 DIAGNOSIS — I110 Hypertensive heart disease with heart failure: Secondary | ICD-10-CM | POA: Diagnosis not present

## 2021-02-20 DIAGNOSIS — F0390 Unspecified dementia without behavioral disturbance: Secondary | ICD-10-CM | POA: Diagnosis not present

## 2021-02-21 DIAGNOSIS — F0390 Unspecified dementia without behavioral disturbance: Secondary | ICD-10-CM | POA: Diagnosis not present

## 2021-02-21 DIAGNOSIS — I509 Heart failure, unspecified: Secondary | ICD-10-CM | POA: Diagnosis not present

## 2021-02-21 DIAGNOSIS — I110 Hypertensive heart disease with heart failure: Secondary | ICD-10-CM | POA: Diagnosis not present

## 2021-02-21 DIAGNOSIS — E785 Hyperlipidemia, unspecified: Secondary | ICD-10-CM | POA: Diagnosis not present

## 2021-02-21 DIAGNOSIS — R630 Anorexia: Secondary | ICD-10-CM | POA: Diagnosis not present

## 2021-02-21 DIAGNOSIS — I4891 Unspecified atrial fibrillation: Secondary | ICD-10-CM | POA: Diagnosis not present

## 2021-02-22 DIAGNOSIS — I509 Heart failure, unspecified: Secondary | ICD-10-CM | POA: Diagnosis not present

## 2021-02-22 DIAGNOSIS — I4891 Unspecified atrial fibrillation: Secondary | ICD-10-CM | POA: Diagnosis not present

## 2021-02-22 DIAGNOSIS — I110 Hypertensive heart disease with heart failure: Secondary | ICD-10-CM | POA: Diagnosis not present

## 2021-02-22 DIAGNOSIS — R630 Anorexia: Secondary | ICD-10-CM | POA: Diagnosis not present

## 2021-02-22 DIAGNOSIS — E785 Hyperlipidemia, unspecified: Secondary | ICD-10-CM | POA: Diagnosis not present

## 2021-02-22 DIAGNOSIS — F0390 Unspecified dementia without behavioral disturbance: Secondary | ICD-10-CM | POA: Diagnosis not present

## 2021-02-26 DIAGNOSIS — F0390 Unspecified dementia without behavioral disturbance: Secondary | ICD-10-CM | POA: Diagnosis not present

## 2021-02-26 DIAGNOSIS — I110 Hypertensive heart disease with heart failure: Secondary | ICD-10-CM | POA: Diagnosis not present

## 2021-02-26 DIAGNOSIS — I4891 Unspecified atrial fibrillation: Secondary | ICD-10-CM | POA: Diagnosis not present

## 2021-02-26 DIAGNOSIS — E785 Hyperlipidemia, unspecified: Secondary | ICD-10-CM | POA: Diagnosis not present

## 2021-02-26 DIAGNOSIS — I509 Heart failure, unspecified: Secondary | ICD-10-CM | POA: Diagnosis not present

## 2021-02-26 DIAGNOSIS — R630 Anorexia: Secondary | ICD-10-CM | POA: Diagnosis not present

## 2021-02-27 DIAGNOSIS — R630 Anorexia: Secondary | ICD-10-CM | POA: Diagnosis not present

## 2021-02-27 DIAGNOSIS — I110 Hypertensive heart disease with heart failure: Secondary | ICD-10-CM | POA: Diagnosis not present

## 2021-02-27 DIAGNOSIS — I509 Heart failure, unspecified: Secondary | ICD-10-CM | POA: Diagnosis not present

## 2021-02-27 DIAGNOSIS — F0390 Unspecified dementia without behavioral disturbance: Secondary | ICD-10-CM | POA: Diagnosis not present

## 2021-02-27 DIAGNOSIS — E785 Hyperlipidemia, unspecified: Secondary | ICD-10-CM | POA: Diagnosis not present

## 2021-02-27 DIAGNOSIS — I4891 Unspecified atrial fibrillation: Secondary | ICD-10-CM | POA: Diagnosis not present

## 2021-03-01 DIAGNOSIS — E785 Hyperlipidemia, unspecified: Secondary | ICD-10-CM | POA: Diagnosis not present

## 2021-03-01 DIAGNOSIS — F0390 Unspecified dementia without behavioral disturbance: Secondary | ICD-10-CM | POA: Diagnosis not present

## 2021-03-01 DIAGNOSIS — I509 Heart failure, unspecified: Secondary | ICD-10-CM | POA: Diagnosis not present

## 2021-03-01 DIAGNOSIS — I4891 Unspecified atrial fibrillation: Secondary | ICD-10-CM | POA: Diagnosis not present

## 2021-03-01 DIAGNOSIS — I110 Hypertensive heart disease with heart failure: Secondary | ICD-10-CM | POA: Diagnosis not present

## 2021-03-01 DIAGNOSIS — R630 Anorexia: Secondary | ICD-10-CM | POA: Diagnosis not present

## 2021-03-05 DIAGNOSIS — I110 Hypertensive heart disease with heart failure: Secondary | ICD-10-CM | POA: Diagnosis not present

## 2021-03-05 DIAGNOSIS — F0390 Unspecified dementia without behavioral disturbance: Secondary | ICD-10-CM | POA: Diagnosis not present

## 2021-03-05 DIAGNOSIS — I509 Heart failure, unspecified: Secondary | ICD-10-CM | POA: Diagnosis not present

## 2021-03-05 DIAGNOSIS — I4891 Unspecified atrial fibrillation: Secondary | ICD-10-CM | POA: Diagnosis not present

## 2021-03-05 DIAGNOSIS — R630 Anorexia: Secondary | ICD-10-CM | POA: Diagnosis not present

## 2021-03-05 DIAGNOSIS — E785 Hyperlipidemia, unspecified: Secondary | ICD-10-CM | POA: Diagnosis not present

## 2021-03-06 DIAGNOSIS — N400 Enlarged prostate without lower urinary tract symptoms: Secondary | ICD-10-CM | POA: Diagnosis not present

## 2021-03-06 DIAGNOSIS — E039 Hypothyroidism, unspecified: Secondary | ICD-10-CM | POA: Diagnosis not present

## 2021-03-06 DIAGNOSIS — I509 Heart failure, unspecified: Secondary | ICD-10-CM | POA: Diagnosis not present

## 2021-03-06 DIAGNOSIS — I4891 Unspecified atrial fibrillation: Secondary | ICD-10-CM | POA: Diagnosis not present

## 2021-03-06 DIAGNOSIS — K219 Gastro-esophageal reflux disease without esophagitis: Secondary | ICD-10-CM | POA: Diagnosis not present

## 2021-03-06 DIAGNOSIS — I25709 Atherosclerosis of coronary artery bypass graft(s), unspecified, with unspecified angina pectoris: Secondary | ICD-10-CM | POA: Diagnosis not present

## 2021-03-06 DIAGNOSIS — E119 Type 2 diabetes mellitus without complications: Secondary | ICD-10-CM | POA: Diagnosis not present

## 2021-03-06 DIAGNOSIS — J690 Pneumonitis due to inhalation of food and vomit: Secondary | ICD-10-CM | POA: Diagnosis not present

## 2021-03-06 DIAGNOSIS — E785 Hyperlipidemia, unspecified: Secondary | ICD-10-CM | POA: Diagnosis not present

## 2021-03-06 DIAGNOSIS — K279 Peptic ulcer, site unspecified, unspecified as acute or chronic, without hemorrhage or perforation: Secondary | ICD-10-CM | POA: Diagnosis not present

## 2021-03-06 DIAGNOSIS — Z8719 Personal history of other diseases of the digestive system: Secondary | ICD-10-CM | POA: Diagnosis not present

## 2021-03-06 DIAGNOSIS — R630 Anorexia: Secondary | ICD-10-CM | POA: Diagnosis not present

## 2021-03-06 DIAGNOSIS — F0390 Unspecified dementia without behavioral disturbance: Secondary | ICD-10-CM | POA: Diagnosis not present

## 2021-03-06 DIAGNOSIS — I110 Hypertensive heart disease with heart failure: Secondary | ICD-10-CM | POA: Diagnosis not present

## 2021-03-07 DIAGNOSIS — I110 Hypertensive heart disease with heart failure: Secondary | ICD-10-CM | POA: Diagnosis not present

## 2021-03-07 DIAGNOSIS — R630 Anorexia: Secondary | ICD-10-CM | POA: Diagnosis not present

## 2021-03-07 DIAGNOSIS — I509 Heart failure, unspecified: Secondary | ICD-10-CM | POA: Diagnosis not present

## 2021-03-07 DIAGNOSIS — F0390 Unspecified dementia without behavioral disturbance: Secondary | ICD-10-CM | POA: Diagnosis not present

## 2021-03-07 DIAGNOSIS — E785 Hyperlipidemia, unspecified: Secondary | ICD-10-CM | POA: Diagnosis not present

## 2021-03-07 DIAGNOSIS — I4891 Unspecified atrial fibrillation: Secondary | ICD-10-CM | POA: Diagnosis not present

## 2021-03-08 DIAGNOSIS — E785 Hyperlipidemia, unspecified: Secondary | ICD-10-CM | POA: Diagnosis not present

## 2021-03-08 DIAGNOSIS — I509 Heart failure, unspecified: Secondary | ICD-10-CM | POA: Diagnosis not present

## 2021-03-08 DIAGNOSIS — F0390 Unspecified dementia without behavioral disturbance: Secondary | ICD-10-CM | POA: Diagnosis not present

## 2021-03-08 DIAGNOSIS — I4891 Unspecified atrial fibrillation: Secondary | ICD-10-CM | POA: Diagnosis not present

## 2021-03-08 DIAGNOSIS — I110 Hypertensive heart disease with heart failure: Secondary | ICD-10-CM | POA: Diagnosis not present

## 2021-03-08 DIAGNOSIS — R630 Anorexia: Secondary | ICD-10-CM | POA: Diagnosis not present

## 2021-03-12 DIAGNOSIS — I110 Hypertensive heart disease with heart failure: Secondary | ICD-10-CM | POA: Diagnosis not present

## 2021-03-12 DIAGNOSIS — E785 Hyperlipidemia, unspecified: Secondary | ICD-10-CM | POA: Diagnosis not present

## 2021-03-12 DIAGNOSIS — I4891 Unspecified atrial fibrillation: Secondary | ICD-10-CM | POA: Diagnosis not present

## 2021-03-12 DIAGNOSIS — I509 Heart failure, unspecified: Secondary | ICD-10-CM | POA: Diagnosis not present

## 2021-03-12 DIAGNOSIS — F0390 Unspecified dementia without behavioral disturbance: Secondary | ICD-10-CM | POA: Diagnosis not present

## 2021-03-12 DIAGNOSIS — R630 Anorexia: Secondary | ICD-10-CM | POA: Diagnosis not present

## 2021-03-13 DIAGNOSIS — F0390 Unspecified dementia without behavioral disturbance: Secondary | ICD-10-CM | POA: Diagnosis not present

## 2021-03-13 DIAGNOSIS — E785 Hyperlipidemia, unspecified: Secondary | ICD-10-CM | POA: Diagnosis not present

## 2021-03-13 DIAGNOSIS — R630 Anorexia: Secondary | ICD-10-CM | POA: Diagnosis not present

## 2021-03-13 DIAGNOSIS — I4891 Unspecified atrial fibrillation: Secondary | ICD-10-CM | POA: Diagnosis not present

## 2021-03-13 DIAGNOSIS — I509 Heart failure, unspecified: Secondary | ICD-10-CM | POA: Diagnosis not present

## 2021-03-13 DIAGNOSIS — I110 Hypertensive heart disease with heart failure: Secondary | ICD-10-CM | POA: Diagnosis not present

## 2021-03-14 DIAGNOSIS — I509 Heart failure, unspecified: Secondary | ICD-10-CM | POA: Diagnosis not present

## 2021-03-14 DIAGNOSIS — R630 Anorexia: Secondary | ICD-10-CM | POA: Diagnosis not present

## 2021-03-14 DIAGNOSIS — F0390 Unspecified dementia without behavioral disturbance: Secondary | ICD-10-CM | POA: Diagnosis not present

## 2021-03-14 DIAGNOSIS — E785 Hyperlipidemia, unspecified: Secondary | ICD-10-CM | POA: Diagnosis not present

## 2021-03-14 DIAGNOSIS — I4891 Unspecified atrial fibrillation: Secondary | ICD-10-CM | POA: Diagnosis not present

## 2021-03-14 DIAGNOSIS — I110 Hypertensive heart disease with heart failure: Secondary | ICD-10-CM | POA: Diagnosis not present

## 2021-03-15 DIAGNOSIS — F0390 Unspecified dementia without behavioral disturbance: Secondary | ICD-10-CM | POA: Diagnosis not present

## 2021-03-15 DIAGNOSIS — I4891 Unspecified atrial fibrillation: Secondary | ICD-10-CM | POA: Diagnosis not present

## 2021-03-15 DIAGNOSIS — I509 Heart failure, unspecified: Secondary | ICD-10-CM | POA: Diagnosis not present

## 2021-03-15 DIAGNOSIS — I110 Hypertensive heart disease with heart failure: Secondary | ICD-10-CM | POA: Diagnosis not present

## 2021-03-15 DIAGNOSIS — E785 Hyperlipidemia, unspecified: Secondary | ICD-10-CM | POA: Diagnosis not present

## 2021-03-15 DIAGNOSIS — R630 Anorexia: Secondary | ICD-10-CM | POA: Diagnosis not present

## 2021-03-19 DIAGNOSIS — R630 Anorexia: Secondary | ICD-10-CM | POA: Diagnosis not present

## 2021-03-19 DIAGNOSIS — I509 Heart failure, unspecified: Secondary | ICD-10-CM | POA: Diagnosis not present

## 2021-03-19 DIAGNOSIS — I4891 Unspecified atrial fibrillation: Secondary | ICD-10-CM | POA: Diagnosis not present

## 2021-03-19 DIAGNOSIS — F0390 Unspecified dementia without behavioral disturbance: Secondary | ICD-10-CM | POA: Diagnosis not present

## 2021-03-19 DIAGNOSIS — E785 Hyperlipidemia, unspecified: Secondary | ICD-10-CM | POA: Diagnosis not present

## 2021-03-19 DIAGNOSIS — I110 Hypertensive heart disease with heart failure: Secondary | ICD-10-CM | POA: Diagnosis not present

## 2021-03-20 DIAGNOSIS — I4891 Unspecified atrial fibrillation: Secondary | ICD-10-CM | POA: Diagnosis not present

## 2021-03-20 DIAGNOSIS — R630 Anorexia: Secondary | ICD-10-CM | POA: Diagnosis not present

## 2021-03-20 DIAGNOSIS — I509 Heart failure, unspecified: Secondary | ICD-10-CM | POA: Diagnosis not present

## 2021-03-20 DIAGNOSIS — E785 Hyperlipidemia, unspecified: Secondary | ICD-10-CM | POA: Diagnosis not present

## 2021-03-20 DIAGNOSIS — I110 Hypertensive heart disease with heart failure: Secondary | ICD-10-CM | POA: Diagnosis not present

## 2021-03-20 DIAGNOSIS — F0390 Unspecified dementia without behavioral disturbance: Secondary | ICD-10-CM | POA: Diagnosis not present

## 2021-03-21 DIAGNOSIS — E785 Hyperlipidemia, unspecified: Secondary | ICD-10-CM | POA: Diagnosis not present

## 2021-03-21 DIAGNOSIS — I509 Heart failure, unspecified: Secondary | ICD-10-CM | POA: Diagnosis not present

## 2021-03-21 DIAGNOSIS — I4891 Unspecified atrial fibrillation: Secondary | ICD-10-CM | POA: Diagnosis not present

## 2021-03-21 DIAGNOSIS — I110 Hypertensive heart disease with heart failure: Secondary | ICD-10-CM | POA: Diagnosis not present

## 2021-03-21 DIAGNOSIS — F0390 Unspecified dementia without behavioral disturbance: Secondary | ICD-10-CM | POA: Diagnosis not present

## 2021-03-21 DIAGNOSIS — R630 Anorexia: Secondary | ICD-10-CM | POA: Diagnosis not present

## 2021-03-22 DIAGNOSIS — I509 Heart failure, unspecified: Secondary | ICD-10-CM | POA: Diagnosis not present

## 2021-03-22 DIAGNOSIS — I4891 Unspecified atrial fibrillation: Secondary | ICD-10-CM | POA: Diagnosis not present

## 2021-03-22 DIAGNOSIS — I110 Hypertensive heart disease with heart failure: Secondary | ICD-10-CM | POA: Diagnosis not present

## 2021-03-22 DIAGNOSIS — E785 Hyperlipidemia, unspecified: Secondary | ICD-10-CM | POA: Diagnosis not present

## 2021-03-22 DIAGNOSIS — F0390 Unspecified dementia without behavioral disturbance: Secondary | ICD-10-CM | POA: Diagnosis not present

## 2021-03-22 DIAGNOSIS — R630 Anorexia: Secondary | ICD-10-CM | POA: Diagnosis not present

## 2021-03-26 DIAGNOSIS — I110 Hypertensive heart disease with heart failure: Secondary | ICD-10-CM | POA: Diagnosis not present

## 2021-03-26 DIAGNOSIS — E785 Hyperlipidemia, unspecified: Secondary | ICD-10-CM | POA: Diagnosis not present

## 2021-03-26 DIAGNOSIS — F0390 Unspecified dementia without behavioral disturbance: Secondary | ICD-10-CM | POA: Diagnosis not present

## 2021-03-26 DIAGNOSIS — R630 Anorexia: Secondary | ICD-10-CM | POA: Diagnosis not present

## 2021-03-26 DIAGNOSIS — I4891 Unspecified atrial fibrillation: Secondary | ICD-10-CM | POA: Diagnosis not present

## 2021-03-26 DIAGNOSIS — I509 Heart failure, unspecified: Secondary | ICD-10-CM | POA: Diagnosis not present

## 2021-03-27 DIAGNOSIS — I110 Hypertensive heart disease with heart failure: Secondary | ICD-10-CM | POA: Diagnosis not present

## 2021-03-27 DIAGNOSIS — I4891 Unspecified atrial fibrillation: Secondary | ICD-10-CM | POA: Diagnosis not present

## 2021-03-27 DIAGNOSIS — I509 Heart failure, unspecified: Secondary | ICD-10-CM | POA: Diagnosis not present

## 2021-03-27 DIAGNOSIS — E785 Hyperlipidemia, unspecified: Secondary | ICD-10-CM | POA: Diagnosis not present

## 2021-03-27 DIAGNOSIS — F0390 Unspecified dementia without behavioral disturbance: Secondary | ICD-10-CM | POA: Diagnosis not present

## 2021-03-27 DIAGNOSIS — R630 Anorexia: Secondary | ICD-10-CM | POA: Diagnosis not present

## 2021-03-28 DIAGNOSIS — I509 Heart failure, unspecified: Secondary | ICD-10-CM | POA: Diagnosis not present

## 2021-03-28 DIAGNOSIS — I110 Hypertensive heart disease with heart failure: Secondary | ICD-10-CM | POA: Diagnosis not present

## 2021-03-28 DIAGNOSIS — F0390 Unspecified dementia without behavioral disturbance: Secondary | ICD-10-CM | POA: Diagnosis not present

## 2021-03-28 DIAGNOSIS — E785 Hyperlipidemia, unspecified: Secondary | ICD-10-CM | POA: Diagnosis not present

## 2021-03-28 DIAGNOSIS — I4891 Unspecified atrial fibrillation: Secondary | ICD-10-CM | POA: Diagnosis not present

## 2021-03-28 DIAGNOSIS — R630 Anorexia: Secondary | ICD-10-CM | POA: Diagnosis not present

## 2021-03-29 DIAGNOSIS — I4891 Unspecified atrial fibrillation: Secondary | ICD-10-CM | POA: Diagnosis not present

## 2021-03-29 DIAGNOSIS — E785 Hyperlipidemia, unspecified: Secondary | ICD-10-CM | POA: Diagnosis not present

## 2021-03-29 DIAGNOSIS — I509 Heart failure, unspecified: Secondary | ICD-10-CM | POA: Diagnosis not present

## 2021-03-29 DIAGNOSIS — R630 Anorexia: Secondary | ICD-10-CM | POA: Diagnosis not present

## 2021-03-29 DIAGNOSIS — I110 Hypertensive heart disease with heart failure: Secondary | ICD-10-CM | POA: Diagnosis not present

## 2021-03-29 DIAGNOSIS — F0390 Unspecified dementia without behavioral disturbance: Secondary | ICD-10-CM | POA: Diagnosis not present

## 2021-04-02 DIAGNOSIS — I509 Heart failure, unspecified: Secondary | ICD-10-CM | POA: Diagnosis not present

## 2021-04-02 DIAGNOSIS — F0390 Unspecified dementia without behavioral disturbance: Secondary | ICD-10-CM | POA: Diagnosis not present

## 2021-04-02 DIAGNOSIS — I110 Hypertensive heart disease with heart failure: Secondary | ICD-10-CM | POA: Diagnosis not present

## 2021-04-02 DIAGNOSIS — E785 Hyperlipidemia, unspecified: Secondary | ICD-10-CM | POA: Diagnosis not present

## 2021-04-02 DIAGNOSIS — R630 Anorexia: Secondary | ICD-10-CM | POA: Diagnosis not present

## 2021-04-02 DIAGNOSIS — I4891 Unspecified atrial fibrillation: Secondary | ICD-10-CM | POA: Diagnosis not present

## 2021-04-03 DIAGNOSIS — F0390 Unspecified dementia without behavioral disturbance: Secondary | ICD-10-CM | POA: Diagnosis not present

## 2021-04-03 DIAGNOSIS — E785 Hyperlipidemia, unspecified: Secondary | ICD-10-CM | POA: Diagnosis not present

## 2021-04-03 DIAGNOSIS — I509 Heart failure, unspecified: Secondary | ICD-10-CM | POA: Diagnosis not present

## 2021-04-03 DIAGNOSIS — I110 Hypertensive heart disease with heart failure: Secondary | ICD-10-CM | POA: Diagnosis not present

## 2021-04-03 DIAGNOSIS — R630 Anorexia: Secondary | ICD-10-CM | POA: Diagnosis not present

## 2021-04-03 DIAGNOSIS — I4891 Unspecified atrial fibrillation: Secondary | ICD-10-CM | POA: Diagnosis not present

## 2021-04-04 DIAGNOSIS — F0390 Unspecified dementia without behavioral disturbance: Secondary | ICD-10-CM | POA: Diagnosis not present

## 2021-04-04 DIAGNOSIS — I4891 Unspecified atrial fibrillation: Secondary | ICD-10-CM | POA: Diagnosis not present

## 2021-04-04 DIAGNOSIS — R630 Anorexia: Secondary | ICD-10-CM | POA: Diagnosis not present

## 2021-04-04 DIAGNOSIS — E785 Hyperlipidemia, unspecified: Secondary | ICD-10-CM | POA: Diagnosis not present

## 2021-04-04 DIAGNOSIS — I110 Hypertensive heart disease with heart failure: Secondary | ICD-10-CM | POA: Diagnosis not present

## 2021-04-04 DIAGNOSIS — I509 Heart failure, unspecified: Secondary | ICD-10-CM | POA: Diagnosis not present

## 2021-04-05 DIAGNOSIS — I25709 Atherosclerosis of coronary artery bypass graft(s), unspecified, with unspecified angina pectoris: Secondary | ICD-10-CM | POA: Diagnosis not present

## 2021-04-05 DIAGNOSIS — E785 Hyperlipidemia, unspecified: Secondary | ICD-10-CM | POA: Diagnosis not present

## 2021-04-05 DIAGNOSIS — I110 Hypertensive heart disease with heart failure: Secondary | ICD-10-CM | POA: Diagnosis not present

## 2021-04-05 DIAGNOSIS — E119 Type 2 diabetes mellitus without complications: Secondary | ICD-10-CM | POA: Diagnosis not present

## 2021-04-05 DIAGNOSIS — R630 Anorexia: Secondary | ICD-10-CM | POA: Diagnosis not present

## 2021-04-05 DIAGNOSIS — I509 Heart failure, unspecified: Secondary | ICD-10-CM | POA: Diagnosis not present

## 2021-04-05 DIAGNOSIS — Z8719 Personal history of other diseases of the digestive system: Secondary | ICD-10-CM | POA: Diagnosis not present

## 2021-04-05 DIAGNOSIS — K279 Peptic ulcer, site unspecified, unspecified as acute or chronic, without hemorrhage or perforation: Secondary | ICD-10-CM | POA: Diagnosis not present

## 2021-04-05 DIAGNOSIS — I4891 Unspecified atrial fibrillation: Secondary | ICD-10-CM | POA: Diagnosis not present

## 2021-04-05 DIAGNOSIS — J690 Pneumonitis due to inhalation of food and vomit: Secondary | ICD-10-CM | POA: Diagnosis not present

## 2021-04-05 DIAGNOSIS — F0390 Unspecified dementia without behavioral disturbance: Secondary | ICD-10-CM | POA: Diagnosis not present

## 2021-04-05 DIAGNOSIS — E039 Hypothyroidism, unspecified: Secondary | ICD-10-CM | POA: Diagnosis not present

## 2021-04-05 DIAGNOSIS — K219 Gastro-esophageal reflux disease without esophagitis: Secondary | ICD-10-CM | POA: Diagnosis not present

## 2021-04-05 DIAGNOSIS — N400 Enlarged prostate without lower urinary tract symptoms: Secondary | ICD-10-CM | POA: Diagnosis not present

## 2021-04-06 DIAGNOSIS — I110 Hypertensive heart disease with heart failure: Secondary | ICD-10-CM | POA: Diagnosis not present

## 2021-04-06 DIAGNOSIS — E785 Hyperlipidemia, unspecified: Secondary | ICD-10-CM | POA: Diagnosis not present

## 2021-04-06 DIAGNOSIS — I4891 Unspecified atrial fibrillation: Secondary | ICD-10-CM | POA: Diagnosis not present

## 2021-04-06 DIAGNOSIS — I509 Heart failure, unspecified: Secondary | ICD-10-CM | POA: Diagnosis not present

## 2021-04-06 DIAGNOSIS — F0390 Unspecified dementia without behavioral disturbance: Secondary | ICD-10-CM | POA: Diagnosis not present

## 2021-04-06 DIAGNOSIS — R630 Anorexia: Secondary | ICD-10-CM | POA: Diagnosis not present

## 2021-04-07 DIAGNOSIS — I4891 Unspecified atrial fibrillation: Secondary | ICD-10-CM | POA: Diagnosis not present

## 2021-04-07 DIAGNOSIS — I509 Heart failure, unspecified: Secondary | ICD-10-CM | POA: Diagnosis not present

## 2021-04-07 DIAGNOSIS — I110 Hypertensive heart disease with heart failure: Secondary | ICD-10-CM | POA: Diagnosis not present

## 2021-04-07 DIAGNOSIS — F0390 Unspecified dementia without behavioral disturbance: Secondary | ICD-10-CM | POA: Diagnosis not present

## 2021-04-07 DIAGNOSIS — R630 Anorexia: Secondary | ICD-10-CM | POA: Diagnosis not present

## 2021-04-07 DIAGNOSIS — E785 Hyperlipidemia, unspecified: Secondary | ICD-10-CM | POA: Diagnosis not present

## 2021-04-08 DIAGNOSIS — E785 Hyperlipidemia, unspecified: Secondary | ICD-10-CM | POA: Diagnosis not present

## 2021-04-08 DIAGNOSIS — F0390 Unspecified dementia without behavioral disturbance: Secondary | ICD-10-CM | POA: Diagnosis not present

## 2021-04-08 DIAGNOSIS — R630 Anorexia: Secondary | ICD-10-CM | POA: Diagnosis not present

## 2021-04-08 DIAGNOSIS — I110 Hypertensive heart disease with heart failure: Secondary | ICD-10-CM | POA: Diagnosis not present

## 2021-04-08 DIAGNOSIS — I509 Heart failure, unspecified: Secondary | ICD-10-CM | POA: Diagnosis not present

## 2021-04-08 DIAGNOSIS — I4891 Unspecified atrial fibrillation: Secondary | ICD-10-CM | POA: Diagnosis not present

## 2021-04-09 DIAGNOSIS — I509 Heart failure, unspecified: Secondary | ICD-10-CM | POA: Diagnosis not present

## 2021-04-09 DIAGNOSIS — I4891 Unspecified atrial fibrillation: Secondary | ICD-10-CM | POA: Diagnosis not present

## 2021-04-09 DIAGNOSIS — F0390 Unspecified dementia without behavioral disturbance: Secondary | ICD-10-CM | POA: Diagnosis not present

## 2021-04-09 DIAGNOSIS — R630 Anorexia: Secondary | ICD-10-CM | POA: Diagnosis not present

## 2021-04-09 DIAGNOSIS — E785 Hyperlipidemia, unspecified: Secondary | ICD-10-CM | POA: Diagnosis not present

## 2021-04-09 DIAGNOSIS — I110 Hypertensive heart disease with heart failure: Secondary | ICD-10-CM | POA: Diagnosis not present

## 2021-04-10 DIAGNOSIS — I509 Heart failure, unspecified: Secondary | ICD-10-CM | POA: Diagnosis not present

## 2021-04-10 DIAGNOSIS — F0390 Unspecified dementia without behavioral disturbance: Secondary | ICD-10-CM | POA: Diagnosis not present

## 2021-04-10 DIAGNOSIS — E785 Hyperlipidemia, unspecified: Secondary | ICD-10-CM | POA: Diagnosis not present

## 2021-04-10 DIAGNOSIS — I4891 Unspecified atrial fibrillation: Secondary | ICD-10-CM | POA: Diagnosis not present

## 2021-04-10 DIAGNOSIS — R630 Anorexia: Secondary | ICD-10-CM | POA: Diagnosis not present

## 2021-04-10 DIAGNOSIS — I110 Hypertensive heart disease with heart failure: Secondary | ICD-10-CM | POA: Diagnosis not present

## 2021-04-11 DIAGNOSIS — F0390 Unspecified dementia without behavioral disturbance: Secondary | ICD-10-CM | POA: Diagnosis not present

## 2021-04-11 DIAGNOSIS — I110 Hypertensive heart disease with heart failure: Secondary | ICD-10-CM | POA: Diagnosis not present

## 2021-04-11 DIAGNOSIS — I509 Heart failure, unspecified: Secondary | ICD-10-CM | POA: Diagnosis not present

## 2021-04-11 DIAGNOSIS — I4891 Unspecified atrial fibrillation: Secondary | ICD-10-CM | POA: Diagnosis not present

## 2021-04-11 DIAGNOSIS — E785 Hyperlipidemia, unspecified: Secondary | ICD-10-CM | POA: Diagnosis not present

## 2021-04-11 DIAGNOSIS — R630 Anorexia: Secondary | ICD-10-CM | POA: Diagnosis not present

## 2021-04-12 DIAGNOSIS — R630 Anorexia: Secondary | ICD-10-CM | POA: Diagnosis not present

## 2021-04-12 DIAGNOSIS — I4891 Unspecified atrial fibrillation: Secondary | ICD-10-CM | POA: Diagnosis not present

## 2021-04-12 DIAGNOSIS — F0390 Unspecified dementia without behavioral disturbance: Secondary | ICD-10-CM | POA: Diagnosis not present

## 2021-04-12 DIAGNOSIS — I110 Hypertensive heart disease with heart failure: Secondary | ICD-10-CM | POA: Diagnosis not present

## 2021-04-12 DIAGNOSIS — E785 Hyperlipidemia, unspecified: Secondary | ICD-10-CM | POA: Diagnosis not present

## 2021-04-12 DIAGNOSIS — I509 Heart failure, unspecified: Secondary | ICD-10-CM | POA: Diagnosis not present

## 2021-04-15 DIAGNOSIS — R630 Anorexia: Secondary | ICD-10-CM | POA: Diagnosis not present

## 2021-04-15 DIAGNOSIS — I110 Hypertensive heart disease with heart failure: Secondary | ICD-10-CM | POA: Diagnosis not present

## 2021-04-15 DIAGNOSIS — I509 Heart failure, unspecified: Secondary | ICD-10-CM | POA: Diagnosis not present

## 2021-04-15 DIAGNOSIS — I4891 Unspecified atrial fibrillation: Secondary | ICD-10-CM | POA: Diagnosis not present

## 2021-04-15 DIAGNOSIS — F0390 Unspecified dementia without behavioral disturbance: Secondary | ICD-10-CM | POA: Diagnosis not present

## 2021-04-15 DIAGNOSIS — E785 Hyperlipidemia, unspecified: Secondary | ICD-10-CM | POA: Diagnosis not present

## 2021-04-16 DIAGNOSIS — R630 Anorexia: Secondary | ICD-10-CM | POA: Diagnosis not present

## 2021-04-16 DIAGNOSIS — I4891 Unspecified atrial fibrillation: Secondary | ICD-10-CM | POA: Diagnosis not present

## 2021-04-16 DIAGNOSIS — F0390 Unspecified dementia without behavioral disturbance: Secondary | ICD-10-CM | POA: Diagnosis not present

## 2021-04-16 DIAGNOSIS — I110 Hypertensive heart disease with heart failure: Secondary | ICD-10-CM | POA: Diagnosis not present

## 2021-04-16 DIAGNOSIS — I509 Heart failure, unspecified: Secondary | ICD-10-CM | POA: Diagnosis not present

## 2021-04-16 DIAGNOSIS — E785 Hyperlipidemia, unspecified: Secondary | ICD-10-CM | POA: Diagnosis not present

## 2021-04-17 DIAGNOSIS — E785 Hyperlipidemia, unspecified: Secondary | ICD-10-CM | POA: Diagnosis not present

## 2021-04-17 DIAGNOSIS — F0390 Unspecified dementia without behavioral disturbance: Secondary | ICD-10-CM | POA: Diagnosis not present

## 2021-04-17 DIAGNOSIS — R630 Anorexia: Secondary | ICD-10-CM | POA: Diagnosis not present

## 2021-04-17 DIAGNOSIS — I110 Hypertensive heart disease with heart failure: Secondary | ICD-10-CM | POA: Diagnosis not present

## 2021-04-17 DIAGNOSIS — I509 Heart failure, unspecified: Secondary | ICD-10-CM | POA: Diagnosis not present

## 2021-04-17 DIAGNOSIS — I4891 Unspecified atrial fibrillation: Secondary | ICD-10-CM | POA: Diagnosis not present

## 2021-04-18 DIAGNOSIS — I509 Heart failure, unspecified: Secondary | ICD-10-CM | POA: Diagnosis not present

## 2021-04-18 DIAGNOSIS — F0390 Unspecified dementia without behavioral disturbance: Secondary | ICD-10-CM | POA: Diagnosis not present

## 2021-04-18 DIAGNOSIS — I110 Hypertensive heart disease with heart failure: Secondary | ICD-10-CM | POA: Diagnosis not present

## 2021-04-18 DIAGNOSIS — I4891 Unspecified atrial fibrillation: Secondary | ICD-10-CM | POA: Diagnosis not present

## 2021-04-18 DIAGNOSIS — E785 Hyperlipidemia, unspecified: Secondary | ICD-10-CM | POA: Diagnosis not present

## 2021-04-18 DIAGNOSIS — R630 Anorexia: Secondary | ICD-10-CM | POA: Diagnosis not present

## 2021-04-19 DIAGNOSIS — I509 Heart failure, unspecified: Secondary | ICD-10-CM | POA: Diagnosis not present

## 2021-04-19 DIAGNOSIS — E785 Hyperlipidemia, unspecified: Secondary | ICD-10-CM | POA: Diagnosis not present

## 2021-04-19 DIAGNOSIS — F0390 Unspecified dementia without behavioral disturbance: Secondary | ICD-10-CM | POA: Diagnosis not present

## 2021-04-19 DIAGNOSIS — R630 Anorexia: Secondary | ICD-10-CM | POA: Diagnosis not present

## 2021-04-19 DIAGNOSIS — I4891 Unspecified atrial fibrillation: Secondary | ICD-10-CM | POA: Diagnosis not present

## 2021-04-19 DIAGNOSIS — I110 Hypertensive heart disease with heart failure: Secondary | ICD-10-CM | POA: Diagnosis not present

## 2021-04-23 DIAGNOSIS — I110 Hypertensive heart disease with heart failure: Secondary | ICD-10-CM | POA: Diagnosis not present

## 2021-04-23 DIAGNOSIS — I4891 Unspecified atrial fibrillation: Secondary | ICD-10-CM | POA: Diagnosis not present

## 2021-04-23 DIAGNOSIS — F0390 Unspecified dementia without behavioral disturbance: Secondary | ICD-10-CM | POA: Diagnosis not present

## 2021-04-23 DIAGNOSIS — R630 Anorexia: Secondary | ICD-10-CM | POA: Diagnosis not present

## 2021-04-23 DIAGNOSIS — E785 Hyperlipidemia, unspecified: Secondary | ICD-10-CM | POA: Diagnosis not present

## 2021-04-23 DIAGNOSIS — I509 Heart failure, unspecified: Secondary | ICD-10-CM | POA: Diagnosis not present

## 2021-04-24 DIAGNOSIS — I4891 Unspecified atrial fibrillation: Secondary | ICD-10-CM | POA: Diagnosis not present

## 2021-04-24 DIAGNOSIS — F0390 Unspecified dementia without behavioral disturbance: Secondary | ICD-10-CM | POA: Diagnosis not present

## 2021-04-24 DIAGNOSIS — I509 Heart failure, unspecified: Secondary | ICD-10-CM | POA: Diagnosis not present

## 2021-04-24 DIAGNOSIS — I110 Hypertensive heart disease with heart failure: Secondary | ICD-10-CM | POA: Diagnosis not present

## 2021-04-24 DIAGNOSIS — E785 Hyperlipidemia, unspecified: Secondary | ICD-10-CM | POA: Diagnosis not present

## 2021-04-24 DIAGNOSIS — R630 Anorexia: Secondary | ICD-10-CM | POA: Diagnosis not present

## 2021-04-25 DIAGNOSIS — I110 Hypertensive heart disease with heart failure: Secondary | ICD-10-CM | POA: Diagnosis not present

## 2021-04-25 DIAGNOSIS — R630 Anorexia: Secondary | ICD-10-CM | POA: Diagnosis not present

## 2021-04-25 DIAGNOSIS — F0390 Unspecified dementia without behavioral disturbance: Secondary | ICD-10-CM | POA: Diagnosis not present

## 2021-04-25 DIAGNOSIS — I4891 Unspecified atrial fibrillation: Secondary | ICD-10-CM | POA: Diagnosis not present

## 2021-04-25 DIAGNOSIS — E785 Hyperlipidemia, unspecified: Secondary | ICD-10-CM | POA: Diagnosis not present

## 2021-04-25 DIAGNOSIS — I509 Heart failure, unspecified: Secondary | ICD-10-CM | POA: Diagnosis not present

## 2021-04-26 DIAGNOSIS — E785 Hyperlipidemia, unspecified: Secondary | ICD-10-CM | POA: Diagnosis not present

## 2021-04-26 DIAGNOSIS — I4891 Unspecified atrial fibrillation: Secondary | ICD-10-CM | POA: Diagnosis not present

## 2021-04-26 DIAGNOSIS — F0390 Unspecified dementia without behavioral disturbance: Secondary | ICD-10-CM | POA: Diagnosis not present

## 2021-04-26 DIAGNOSIS — I509 Heart failure, unspecified: Secondary | ICD-10-CM | POA: Diagnosis not present

## 2021-04-26 DIAGNOSIS — R630 Anorexia: Secondary | ICD-10-CM | POA: Diagnosis not present

## 2021-04-26 DIAGNOSIS — I110 Hypertensive heart disease with heart failure: Secondary | ICD-10-CM | POA: Diagnosis not present

## 2021-04-30 DIAGNOSIS — E785 Hyperlipidemia, unspecified: Secondary | ICD-10-CM | POA: Diagnosis not present

## 2021-04-30 DIAGNOSIS — I509 Heart failure, unspecified: Secondary | ICD-10-CM | POA: Diagnosis not present

## 2021-04-30 DIAGNOSIS — I4891 Unspecified atrial fibrillation: Secondary | ICD-10-CM | POA: Diagnosis not present

## 2021-04-30 DIAGNOSIS — F0390 Unspecified dementia without behavioral disturbance: Secondary | ICD-10-CM | POA: Diagnosis not present

## 2021-04-30 DIAGNOSIS — R630 Anorexia: Secondary | ICD-10-CM | POA: Diagnosis not present

## 2021-04-30 DIAGNOSIS — I110 Hypertensive heart disease with heart failure: Secondary | ICD-10-CM | POA: Diagnosis not present

## 2021-05-01 DIAGNOSIS — E785 Hyperlipidemia, unspecified: Secondary | ICD-10-CM | POA: Diagnosis not present

## 2021-05-01 DIAGNOSIS — F0390 Unspecified dementia without behavioral disturbance: Secondary | ICD-10-CM | POA: Diagnosis not present

## 2021-05-01 DIAGNOSIS — I509 Heart failure, unspecified: Secondary | ICD-10-CM | POA: Diagnosis not present

## 2021-05-01 DIAGNOSIS — I4891 Unspecified atrial fibrillation: Secondary | ICD-10-CM | POA: Diagnosis not present

## 2021-05-01 DIAGNOSIS — I110 Hypertensive heart disease with heart failure: Secondary | ICD-10-CM | POA: Diagnosis not present

## 2021-05-01 DIAGNOSIS — R630 Anorexia: Secondary | ICD-10-CM | POA: Diagnosis not present

## 2021-05-02 DIAGNOSIS — I110 Hypertensive heart disease with heart failure: Secondary | ICD-10-CM | POA: Diagnosis not present

## 2021-05-02 DIAGNOSIS — E785 Hyperlipidemia, unspecified: Secondary | ICD-10-CM | POA: Diagnosis not present

## 2021-05-02 DIAGNOSIS — R630 Anorexia: Secondary | ICD-10-CM | POA: Diagnosis not present

## 2021-05-02 DIAGNOSIS — I509 Heart failure, unspecified: Secondary | ICD-10-CM | POA: Diagnosis not present

## 2021-05-02 DIAGNOSIS — I4891 Unspecified atrial fibrillation: Secondary | ICD-10-CM | POA: Diagnosis not present

## 2021-05-02 DIAGNOSIS — F0390 Unspecified dementia without behavioral disturbance: Secondary | ICD-10-CM | POA: Diagnosis not present

## 2021-05-03 DIAGNOSIS — I4891 Unspecified atrial fibrillation: Secondary | ICD-10-CM | POA: Diagnosis not present

## 2021-05-03 DIAGNOSIS — R630 Anorexia: Secondary | ICD-10-CM | POA: Diagnosis not present

## 2021-05-03 DIAGNOSIS — F0390 Unspecified dementia without behavioral disturbance: Secondary | ICD-10-CM | POA: Diagnosis not present

## 2021-05-03 DIAGNOSIS — E785 Hyperlipidemia, unspecified: Secondary | ICD-10-CM | POA: Diagnosis not present

## 2021-05-03 DIAGNOSIS — I509 Heart failure, unspecified: Secondary | ICD-10-CM | POA: Diagnosis not present

## 2021-05-03 DIAGNOSIS — I110 Hypertensive heart disease with heart failure: Secondary | ICD-10-CM | POA: Diagnosis not present

## 2021-05-06 DIAGNOSIS — E039 Hypothyroidism, unspecified: Secondary | ICD-10-CM | POA: Diagnosis not present

## 2021-05-06 DIAGNOSIS — E119 Type 2 diabetes mellitus without complications: Secondary | ICD-10-CM | POA: Diagnosis not present

## 2021-05-06 DIAGNOSIS — E785 Hyperlipidemia, unspecified: Secondary | ICD-10-CM | POA: Diagnosis not present

## 2021-05-06 DIAGNOSIS — Z8719 Personal history of other diseases of the digestive system: Secondary | ICD-10-CM | POA: Diagnosis not present

## 2021-05-06 DIAGNOSIS — I4891 Unspecified atrial fibrillation: Secondary | ICD-10-CM | POA: Diagnosis not present

## 2021-05-06 DIAGNOSIS — R630 Anorexia: Secondary | ICD-10-CM | POA: Diagnosis not present

## 2021-05-06 DIAGNOSIS — K279 Peptic ulcer, site unspecified, unspecified as acute or chronic, without hemorrhage or perforation: Secondary | ICD-10-CM | POA: Diagnosis not present

## 2021-05-06 DIAGNOSIS — I110 Hypertensive heart disease with heart failure: Secondary | ICD-10-CM | POA: Diagnosis not present

## 2021-05-06 DIAGNOSIS — K219 Gastro-esophageal reflux disease without esophagitis: Secondary | ICD-10-CM | POA: Diagnosis not present

## 2021-05-06 DIAGNOSIS — I509 Heart failure, unspecified: Secondary | ICD-10-CM | POA: Diagnosis not present

## 2021-05-06 DIAGNOSIS — I25709 Atherosclerosis of coronary artery bypass graft(s), unspecified, with unspecified angina pectoris: Secondary | ICD-10-CM | POA: Diagnosis not present

## 2021-05-06 DIAGNOSIS — N400 Enlarged prostate without lower urinary tract symptoms: Secondary | ICD-10-CM | POA: Diagnosis not present

## 2021-05-06 DIAGNOSIS — J690 Pneumonitis due to inhalation of food and vomit: Secondary | ICD-10-CM | POA: Diagnosis not present

## 2021-05-06 DIAGNOSIS — F0390 Unspecified dementia without behavioral disturbance: Secondary | ICD-10-CM | POA: Diagnosis not present

## 2021-05-07 DIAGNOSIS — F0390 Unspecified dementia without behavioral disturbance: Secondary | ICD-10-CM | POA: Diagnosis not present

## 2021-05-07 DIAGNOSIS — I509 Heart failure, unspecified: Secondary | ICD-10-CM | POA: Diagnosis not present

## 2021-05-07 DIAGNOSIS — I110 Hypertensive heart disease with heart failure: Secondary | ICD-10-CM | POA: Diagnosis not present

## 2021-05-07 DIAGNOSIS — R630 Anorexia: Secondary | ICD-10-CM | POA: Diagnosis not present

## 2021-05-07 DIAGNOSIS — E785 Hyperlipidemia, unspecified: Secondary | ICD-10-CM | POA: Diagnosis not present

## 2021-05-07 DIAGNOSIS — I4891 Unspecified atrial fibrillation: Secondary | ICD-10-CM | POA: Diagnosis not present

## 2021-05-08 DIAGNOSIS — R630 Anorexia: Secondary | ICD-10-CM | POA: Diagnosis not present

## 2021-05-08 DIAGNOSIS — I110 Hypertensive heart disease with heart failure: Secondary | ICD-10-CM | POA: Diagnosis not present

## 2021-05-08 DIAGNOSIS — F0390 Unspecified dementia without behavioral disturbance: Secondary | ICD-10-CM | POA: Diagnosis not present

## 2021-05-08 DIAGNOSIS — E785 Hyperlipidemia, unspecified: Secondary | ICD-10-CM | POA: Diagnosis not present

## 2021-05-08 DIAGNOSIS — I509 Heart failure, unspecified: Secondary | ICD-10-CM | POA: Diagnosis not present

## 2021-05-08 DIAGNOSIS — I4891 Unspecified atrial fibrillation: Secondary | ICD-10-CM | POA: Diagnosis not present

## 2021-05-09 DIAGNOSIS — I509 Heart failure, unspecified: Secondary | ICD-10-CM | POA: Diagnosis not present

## 2021-05-09 DIAGNOSIS — F0390 Unspecified dementia without behavioral disturbance: Secondary | ICD-10-CM | POA: Diagnosis not present

## 2021-05-09 DIAGNOSIS — I110 Hypertensive heart disease with heart failure: Secondary | ICD-10-CM | POA: Diagnosis not present

## 2021-05-09 DIAGNOSIS — E785 Hyperlipidemia, unspecified: Secondary | ICD-10-CM | POA: Diagnosis not present

## 2021-05-09 DIAGNOSIS — I4891 Unspecified atrial fibrillation: Secondary | ICD-10-CM | POA: Diagnosis not present

## 2021-05-09 DIAGNOSIS — R630 Anorexia: Secondary | ICD-10-CM | POA: Diagnosis not present

## 2021-05-10 DIAGNOSIS — I4891 Unspecified atrial fibrillation: Secondary | ICD-10-CM | POA: Diagnosis not present

## 2021-05-10 DIAGNOSIS — E785 Hyperlipidemia, unspecified: Secondary | ICD-10-CM | POA: Diagnosis not present

## 2021-05-10 DIAGNOSIS — F0390 Unspecified dementia without behavioral disturbance: Secondary | ICD-10-CM | POA: Diagnosis not present

## 2021-05-10 DIAGNOSIS — R630 Anorexia: Secondary | ICD-10-CM | POA: Diagnosis not present

## 2021-05-10 DIAGNOSIS — I509 Heart failure, unspecified: Secondary | ICD-10-CM | POA: Diagnosis not present

## 2021-05-10 DIAGNOSIS — I110 Hypertensive heart disease with heart failure: Secondary | ICD-10-CM | POA: Diagnosis not present

## 2021-05-14 DIAGNOSIS — F0390 Unspecified dementia without behavioral disturbance: Secondary | ICD-10-CM | POA: Diagnosis not present

## 2021-05-14 DIAGNOSIS — I4891 Unspecified atrial fibrillation: Secondary | ICD-10-CM | POA: Diagnosis not present

## 2021-05-14 DIAGNOSIS — I110 Hypertensive heart disease with heart failure: Secondary | ICD-10-CM | POA: Diagnosis not present

## 2021-05-14 DIAGNOSIS — E785 Hyperlipidemia, unspecified: Secondary | ICD-10-CM | POA: Diagnosis not present

## 2021-05-14 DIAGNOSIS — R630 Anorexia: Secondary | ICD-10-CM | POA: Diagnosis not present

## 2021-05-14 DIAGNOSIS — I509 Heart failure, unspecified: Secondary | ICD-10-CM | POA: Diagnosis not present

## 2021-05-15 DIAGNOSIS — I4891 Unspecified atrial fibrillation: Secondary | ICD-10-CM | POA: Diagnosis not present

## 2021-05-15 DIAGNOSIS — R630 Anorexia: Secondary | ICD-10-CM | POA: Diagnosis not present

## 2021-05-15 DIAGNOSIS — F0390 Unspecified dementia without behavioral disturbance: Secondary | ICD-10-CM | POA: Diagnosis not present

## 2021-05-15 DIAGNOSIS — I110 Hypertensive heart disease with heart failure: Secondary | ICD-10-CM | POA: Diagnosis not present

## 2021-05-15 DIAGNOSIS — I509 Heart failure, unspecified: Secondary | ICD-10-CM | POA: Diagnosis not present

## 2021-05-15 DIAGNOSIS — E785 Hyperlipidemia, unspecified: Secondary | ICD-10-CM | POA: Diagnosis not present

## 2021-05-16 DIAGNOSIS — E785 Hyperlipidemia, unspecified: Secondary | ICD-10-CM | POA: Diagnosis not present

## 2021-05-16 DIAGNOSIS — F0390 Unspecified dementia without behavioral disturbance: Secondary | ICD-10-CM | POA: Diagnosis not present

## 2021-05-16 DIAGNOSIS — I4891 Unspecified atrial fibrillation: Secondary | ICD-10-CM | POA: Diagnosis not present

## 2021-05-16 DIAGNOSIS — R630 Anorexia: Secondary | ICD-10-CM | POA: Diagnosis not present

## 2021-05-16 DIAGNOSIS — I509 Heart failure, unspecified: Secondary | ICD-10-CM | POA: Diagnosis not present

## 2021-05-16 DIAGNOSIS — I110 Hypertensive heart disease with heart failure: Secondary | ICD-10-CM | POA: Diagnosis not present

## 2021-05-17 DIAGNOSIS — I110 Hypertensive heart disease with heart failure: Secondary | ICD-10-CM | POA: Diagnosis not present

## 2021-05-17 DIAGNOSIS — I4891 Unspecified atrial fibrillation: Secondary | ICD-10-CM | POA: Diagnosis not present

## 2021-05-17 DIAGNOSIS — F0390 Unspecified dementia without behavioral disturbance: Secondary | ICD-10-CM | POA: Diagnosis not present

## 2021-05-17 DIAGNOSIS — I509 Heart failure, unspecified: Secondary | ICD-10-CM | POA: Diagnosis not present

## 2021-05-17 DIAGNOSIS — E785 Hyperlipidemia, unspecified: Secondary | ICD-10-CM | POA: Diagnosis not present

## 2021-05-17 DIAGNOSIS — R630 Anorexia: Secondary | ICD-10-CM | POA: Diagnosis not present

## 2021-05-21 DIAGNOSIS — I4891 Unspecified atrial fibrillation: Secondary | ICD-10-CM | POA: Diagnosis not present

## 2021-05-21 DIAGNOSIS — R630 Anorexia: Secondary | ICD-10-CM | POA: Diagnosis not present

## 2021-05-21 DIAGNOSIS — E785 Hyperlipidemia, unspecified: Secondary | ICD-10-CM | POA: Diagnosis not present

## 2021-05-21 DIAGNOSIS — I110 Hypertensive heart disease with heart failure: Secondary | ICD-10-CM | POA: Diagnosis not present

## 2021-05-21 DIAGNOSIS — F0390 Unspecified dementia without behavioral disturbance: Secondary | ICD-10-CM | POA: Diagnosis not present

## 2021-05-21 DIAGNOSIS — I509 Heart failure, unspecified: Secondary | ICD-10-CM | POA: Diagnosis not present

## 2021-05-22 DIAGNOSIS — I509 Heart failure, unspecified: Secondary | ICD-10-CM | POA: Diagnosis not present

## 2021-05-22 DIAGNOSIS — R630 Anorexia: Secondary | ICD-10-CM | POA: Diagnosis not present

## 2021-05-22 DIAGNOSIS — F0390 Unspecified dementia without behavioral disturbance: Secondary | ICD-10-CM | POA: Diagnosis not present

## 2021-05-22 DIAGNOSIS — I4891 Unspecified atrial fibrillation: Secondary | ICD-10-CM | POA: Diagnosis not present

## 2021-05-22 DIAGNOSIS — I110 Hypertensive heart disease with heart failure: Secondary | ICD-10-CM | POA: Diagnosis not present

## 2021-05-22 DIAGNOSIS — E785 Hyperlipidemia, unspecified: Secondary | ICD-10-CM | POA: Diagnosis not present

## 2021-05-23 DIAGNOSIS — I509 Heart failure, unspecified: Secondary | ICD-10-CM | POA: Diagnosis not present

## 2021-05-23 DIAGNOSIS — R630 Anorexia: Secondary | ICD-10-CM | POA: Diagnosis not present

## 2021-05-23 DIAGNOSIS — I110 Hypertensive heart disease with heart failure: Secondary | ICD-10-CM | POA: Diagnosis not present

## 2021-05-23 DIAGNOSIS — I4891 Unspecified atrial fibrillation: Secondary | ICD-10-CM | POA: Diagnosis not present

## 2021-05-23 DIAGNOSIS — E785 Hyperlipidemia, unspecified: Secondary | ICD-10-CM | POA: Diagnosis not present

## 2021-05-23 DIAGNOSIS — F0390 Unspecified dementia without behavioral disturbance: Secondary | ICD-10-CM | POA: Diagnosis not present

## 2021-05-24 DIAGNOSIS — F0390 Unspecified dementia without behavioral disturbance: Secondary | ICD-10-CM | POA: Diagnosis not present

## 2021-05-24 DIAGNOSIS — I110 Hypertensive heart disease with heart failure: Secondary | ICD-10-CM | POA: Diagnosis not present

## 2021-05-24 DIAGNOSIS — I509 Heart failure, unspecified: Secondary | ICD-10-CM | POA: Diagnosis not present

## 2021-05-24 DIAGNOSIS — I4891 Unspecified atrial fibrillation: Secondary | ICD-10-CM | POA: Diagnosis not present

## 2021-05-24 DIAGNOSIS — E785 Hyperlipidemia, unspecified: Secondary | ICD-10-CM | POA: Diagnosis not present

## 2021-05-24 DIAGNOSIS — R630 Anorexia: Secondary | ICD-10-CM | POA: Diagnosis not present

## 2021-05-28 DIAGNOSIS — I110 Hypertensive heart disease with heart failure: Secondary | ICD-10-CM | POA: Diagnosis not present

## 2021-05-28 DIAGNOSIS — E785 Hyperlipidemia, unspecified: Secondary | ICD-10-CM | POA: Diagnosis not present

## 2021-05-28 DIAGNOSIS — R630 Anorexia: Secondary | ICD-10-CM | POA: Diagnosis not present

## 2021-05-28 DIAGNOSIS — I509 Heart failure, unspecified: Secondary | ICD-10-CM | POA: Diagnosis not present

## 2021-05-28 DIAGNOSIS — F0390 Unspecified dementia without behavioral disturbance: Secondary | ICD-10-CM | POA: Diagnosis not present

## 2021-05-28 DIAGNOSIS — I4891 Unspecified atrial fibrillation: Secondary | ICD-10-CM | POA: Diagnosis not present

## 2021-05-29 DIAGNOSIS — I509 Heart failure, unspecified: Secondary | ICD-10-CM | POA: Diagnosis not present

## 2021-05-29 DIAGNOSIS — I110 Hypertensive heart disease with heart failure: Secondary | ICD-10-CM | POA: Diagnosis not present

## 2021-05-29 DIAGNOSIS — I4891 Unspecified atrial fibrillation: Secondary | ICD-10-CM | POA: Diagnosis not present

## 2021-05-29 DIAGNOSIS — F0390 Unspecified dementia without behavioral disturbance: Secondary | ICD-10-CM | POA: Diagnosis not present

## 2021-05-29 DIAGNOSIS — E785 Hyperlipidemia, unspecified: Secondary | ICD-10-CM | POA: Diagnosis not present

## 2021-05-29 DIAGNOSIS — R630 Anorexia: Secondary | ICD-10-CM | POA: Diagnosis not present

## 2021-05-31 DIAGNOSIS — I509 Heart failure, unspecified: Secondary | ICD-10-CM | POA: Diagnosis not present

## 2021-05-31 DIAGNOSIS — F0390 Unspecified dementia without behavioral disturbance: Secondary | ICD-10-CM | POA: Diagnosis not present

## 2021-05-31 DIAGNOSIS — I4891 Unspecified atrial fibrillation: Secondary | ICD-10-CM | POA: Diagnosis not present

## 2021-05-31 DIAGNOSIS — E785 Hyperlipidemia, unspecified: Secondary | ICD-10-CM | POA: Diagnosis not present

## 2021-05-31 DIAGNOSIS — R630 Anorexia: Secondary | ICD-10-CM | POA: Diagnosis not present

## 2021-05-31 DIAGNOSIS — I110 Hypertensive heart disease with heart failure: Secondary | ICD-10-CM | POA: Diagnosis not present

## 2021-06-03 DIAGNOSIS — I509 Heart failure, unspecified: Secondary | ICD-10-CM | POA: Diagnosis not present

## 2021-06-03 DIAGNOSIS — F0390 Unspecified dementia without behavioral disturbance: Secondary | ICD-10-CM | POA: Diagnosis not present

## 2021-06-03 DIAGNOSIS — I110 Hypertensive heart disease with heart failure: Secondary | ICD-10-CM | POA: Diagnosis not present

## 2021-06-03 DIAGNOSIS — I4891 Unspecified atrial fibrillation: Secondary | ICD-10-CM | POA: Diagnosis not present

## 2021-06-03 DIAGNOSIS — R630 Anorexia: Secondary | ICD-10-CM | POA: Diagnosis not present

## 2021-06-03 DIAGNOSIS — E785 Hyperlipidemia, unspecified: Secondary | ICD-10-CM | POA: Diagnosis not present

## 2021-06-04 DIAGNOSIS — R630 Anorexia: Secondary | ICD-10-CM | POA: Diagnosis not present

## 2021-06-04 DIAGNOSIS — I509 Heart failure, unspecified: Secondary | ICD-10-CM | POA: Diagnosis not present

## 2021-06-04 DIAGNOSIS — E785 Hyperlipidemia, unspecified: Secondary | ICD-10-CM | POA: Diagnosis not present

## 2021-06-04 DIAGNOSIS — I4891 Unspecified atrial fibrillation: Secondary | ICD-10-CM | POA: Diagnosis not present

## 2021-06-04 DIAGNOSIS — F0390 Unspecified dementia without behavioral disturbance: Secondary | ICD-10-CM | POA: Diagnosis not present

## 2021-06-04 DIAGNOSIS — I110 Hypertensive heart disease with heart failure: Secondary | ICD-10-CM | POA: Diagnosis not present

## 2021-06-05 DIAGNOSIS — I110 Hypertensive heart disease with heart failure: Secondary | ICD-10-CM | POA: Diagnosis not present

## 2021-06-05 DIAGNOSIS — R630 Anorexia: Secondary | ICD-10-CM | POA: Diagnosis not present

## 2021-06-05 DIAGNOSIS — I4891 Unspecified atrial fibrillation: Secondary | ICD-10-CM | POA: Diagnosis not present

## 2021-06-05 DIAGNOSIS — F0390 Unspecified dementia without behavioral disturbance: Secondary | ICD-10-CM | POA: Diagnosis not present

## 2021-06-05 DIAGNOSIS — I509 Heart failure, unspecified: Secondary | ICD-10-CM | POA: Diagnosis not present

## 2021-06-05 DIAGNOSIS — E785 Hyperlipidemia, unspecified: Secondary | ICD-10-CM | POA: Diagnosis not present

## 2021-06-06 DIAGNOSIS — J690 Pneumonitis due to inhalation of food and vomit: Secondary | ICD-10-CM | POA: Diagnosis not present

## 2021-06-06 DIAGNOSIS — E039 Hypothyroidism, unspecified: Secondary | ICD-10-CM | POA: Diagnosis not present

## 2021-06-06 DIAGNOSIS — I4891 Unspecified atrial fibrillation: Secondary | ICD-10-CM | POA: Diagnosis not present

## 2021-06-06 DIAGNOSIS — F0390 Unspecified dementia without behavioral disturbance: Secondary | ICD-10-CM | POA: Diagnosis not present

## 2021-06-06 DIAGNOSIS — R630 Anorexia: Secondary | ICD-10-CM | POA: Diagnosis not present

## 2021-06-06 DIAGNOSIS — N400 Enlarged prostate without lower urinary tract symptoms: Secondary | ICD-10-CM | POA: Diagnosis not present

## 2021-06-06 DIAGNOSIS — E785 Hyperlipidemia, unspecified: Secondary | ICD-10-CM | POA: Diagnosis not present

## 2021-06-06 DIAGNOSIS — I110 Hypertensive heart disease with heart failure: Secondary | ICD-10-CM | POA: Diagnosis not present

## 2021-06-06 DIAGNOSIS — I509 Heart failure, unspecified: Secondary | ICD-10-CM | POA: Diagnosis not present

## 2021-06-06 DIAGNOSIS — E119 Type 2 diabetes mellitus without complications: Secondary | ICD-10-CM | POA: Diagnosis not present

## 2021-06-06 DIAGNOSIS — Z8719 Personal history of other diseases of the digestive system: Secondary | ICD-10-CM | POA: Diagnosis not present

## 2021-06-06 DIAGNOSIS — K219 Gastro-esophageal reflux disease without esophagitis: Secondary | ICD-10-CM | POA: Diagnosis not present

## 2021-06-06 DIAGNOSIS — K279 Peptic ulcer, site unspecified, unspecified as acute or chronic, without hemorrhage or perforation: Secondary | ICD-10-CM | POA: Diagnosis not present

## 2021-06-06 DIAGNOSIS — I25709 Atherosclerosis of coronary artery bypass graft(s), unspecified, with unspecified angina pectoris: Secondary | ICD-10-CM | POA: Diagnosis not present

## 2021-06-07 DIAGNOSIS — I4891 Unspecified atrial fibrillation: Secondary | ICD-10-CM | POA: Diagnosis not present

## 2021-06-07 DIAGNOSIS — R630 Anorexia: Secondary | ICD-10-CM | POA: Diagnosis not present

## 2021-06-07 DIAGNOSIS — I110 Hypertensive heart disease with heart failure: Secondary | ICD-10-CM | POA: Diagnosis not present

## 2021-06-07 DIAGNOSIS — E785 Hyperlipidemia, unspecified: Secondary | ICD-10-CM | POA: Diagnosis not present

## 2021-06-07 DIAGNOSIS — I509 Heart failure, unspecified: Secondary | ICD-10-CM | POA: Diagnosis not present

## 2021-06-07 DIAGNOSIS — F0390 Unspecified dementia without behavioral disturbance: Secondary | ICD-10-CM | POA: Diagnosis not present

## 2021-06-11 DIAGNOSIS — I110 Hypertensive heart disease with heart failure: Secondary | ICD-10-CM | POA: Diagnosis not present

## 2021-06-11 DIAGNOSIS — E785 Hyperlipidemia, unspecified: Secondary | ICD-10-CM | POA: Diagnosis not present

## 2021-06-11 DIAGNOSIS — I4891 Unspecified atrial fibrillation: Secondary | ICD-10-CM | POA: Diagnosis not present

## 2021-06-11 DIAGNOSIS — I509 Heart failure, unspecified: Secondary | ICD-10-CM | POA: Diagnosis not present

## 2021-06-11 DIAGNOSIS — R630 Anorexia: Secondary | ICD-10-CM | POA: Diagnosis not present

## 2021-06-11 DIAGNOSIS — F0390 Unspecified dementia without behavioral disturbance: Secondary | ICD-10-CM | POA: Diagnosis not present

## 2021-06-12 DIAGNOSIS — I110 Hypertensive heart disease with heart failure: Secondary | ICD-10-CM | POA: Diagnosis not present

## 2021-06-12 DIAGNOSIS — F0390 Unspecified dementia without behavioral disturbance: Secondary | ICD-10-CM | POA: Diagnosis not present

## 2021-06-12 DIAGNOSIS — I4891 Unspecified atrial fibrillation: Secondary | ICD-10-CM | POA: Diagnosis not present

## 2021-06-12 DIAGNOSIS — R630 Anorexia: Secondary | ICD-10-CM | POA: Diagnosis not present

## 2021-06-12 DIAGNOSIS — E785 Hyperlipidemia, unspecified: Secondary | ICD-10-CM | POA: Diagnosis not present

## 2021-06-12 DIAGNOSIS — I509 Heart failure, unspecified: Secondary | ICD-10-CM | POA: Diagnosis not present

## 2021-06-13 DIAGNOSIS — I4891 Unspecified atrial fibrillation: Secondary | ICD-10-CM | POA: Diagnosis not present

## 2021-06-13 DIAGNOSIS — I509 Heart failure, unspecified: Secondary | ICD-10-CM | POA: Diagnosis not present

## 2021-06-13 DIAGNOSIS — E785 Hyperlipidemia, unspecified: Secondary | ICD-10-CM | POA: Diagnosis not present

## 2021-06-13 DIAGNOSIS — R630 Anorexia: Secondary | ICD-10-CM | POA: Diagnosis not present

## 2021-06-13 DIAGNOSIS — F0390 Unspecified dementia without behavioral disturbance: Secondary | ICD-10-CM | POA: Diagnosis not present

## 2021-06-13 DIAGNOSIS — I110 Hypertensive heart disease with heart failure: Secondary | ICD-10-CM | POA: Diagnosis not present

## 2021-06-18 DIAGNOSIS — F0390 Unspecified dementia without behavioral disturbance: Secondary | ICD-10-CM | POA: Diagnosis not present

## 2021-06-18 DIAGNOSIS — R630 Anorexia: Secondary | ICD-10-CM | POA: Diagnosis not present

## 2021-06-18 DIAGNOSIS — I509 Heart failure, unspecified: Secondary | ICD-10-CM | POA: Diagnosis not present

## 2021-06-18 DIAGNOSIS — E785 Hyperlipidemia, unspecified: Secondary | ICD-10-CM | POA: Diagnosis not present

## 2021-06-18 DIAGNOSIS — I110 Hypertensive heart disease with heart failure: Secondary | ICD-10-CM | POA: Diagnosis not present

## 2021-06-18 DIAGNOSIS — I4891 Unspecified atrial fibrillation: Secondary | ICD-10-CM | POA: Diagnosis not present

## 2021-06-19 DIAGNOSIS — F0390 Unspecified dementia without behavioral disturbance: Secondary | ICD-10-CM | POA: Diagnosis not present

## 2021-06-19 DIAGNOSIS — I509 Heart failure, unspecified: Secondary | ICD-10-CM | POA: Diagnosis not present

## 2021-06-19 DIAGNOSIS — I110 Hypertensive heart disease with heart failure: Secondary | ICD-10-CM | POA: Diagnosis not present

## 2021-06-19 DIAGNOSIS — E785 Hyperlipidemia, unspecified: Secondary | ICD-10-CM | POA: Diagnosis not present

## 2021-06-19 DIAGNOSIS — I4891 Unspecified atrial fibrillation: Secondary | ICD-10-CM | POA: Diagnosis not present

## 2021-06-19 DIAGNOSIS — R630 Anorexia: Secondary | ICD-10-CM | POA: Diagnosis not present

## 2021-06-20 DIAGNOSIS — F0390 Unspecified dementia without behavioral disturbance: Secondary | ICD-10-CM | POA: Diagnosis not present

## 2021-06-20 DIAGNOSIS — I110 Hypertensive heart disease with heart failure: Secondary | ICD-10-CM | POA: Diagnosis not present

## 2021-06-20 DIAGNOSIS — I509 Heart failure, unspecified: Secondary | ICD-10-CM | POA: Diagnosis not present

## 2021-06-20 DIAGNOSIS — I4891 Unspecified atrial fibrillation: Secondary | ICD-10-CM | POA: Diagnosis not present

## 2021-06-20 DIAGNOSIS — E785 Hyperlipidemia, unspecified: Secondary | ICD-10-CM | POA: Diagnosis not present

## 2021-06-20 DIAGNOSIS — R630 Anorexia: Secondary | ICD-10-CM | POA: Diagnosis not present

## 2021-06-21 DIAGNOSIS — I110 Hypertensive heart disease with heart failure: Secondary | ICD-10-CM | POA: Diagnosis not present

## 2021-06-21 DIAGNOSIS — E785 Hyperlipidemia, unspecified: Secondary | ICD-10-CM | POA: Diagnosis not present

## 2021-06-21 DIAGNOSIS — I4891 Unspecified atrial fibrillation: Secondary | ICD-10-CM | POA: Diagnosis not present

## 2021-06-21 DIAGNOSIS — I509 Heart failure, unspecified: Secondary | ICD-10-CM | POA: Diagnosis not present

## 2021-06-21 DIAGNOSIS — R630 Anorexia: Secondary | ICD-10-CM | POA: Diagnosis not present

## 2021-06-21 DIAGNOSIS — F0390 Unspecified dementia without behavioral disturbance: Secondary | ICD-10-CM | POA: Diagnosis not present

## 2021-06-25 DIAGNOSIS — F0390 Unspecified dementia without behavioral disturbance: Secondary | ICD-10-CM | POA: Diagnosis not present

## 2021-06-25 DIAGNOSIS — I110 Hypertensive heart disease with heart failure: Secondary | ICD-10-CM | POA: Diagnosis not present

## 2021-06-25 DIAGNOSIS — I4891 Unspecified atrial fibrillation: Secondary | ICD-10-CM | POA: Diagnosis not present

## 2021-06-25 DIAGNOSIS — R630 Anorexia: Secondary | ICD-10-CM | POA: Diagnosis not present

## 2021-06-25 DIAGNOSIS — E785 Hyperlipidemia, unspecified: Secondary | ICD-10-CM | POA: Diagnosis not present

## 2021-06-25 DIAGNOSIS — I509 Heart failure, unspecified: Secondary | ICD-10-CM | POA: Diagnosis not present

## 2021-06-26 DIAGNOSIS — I509 Heart failure, unspecified: Secondary | ICD-10-CM | POA: Diagnosis not present

## 2021-06-26 DIAGNOSIS — I110 Hypertensive heart disease with heart failure: Secondary | ICD-10-CM | POA: Diagnosis not present

## 2021-06-26 DIAGNOSIS — E785 Hyperlipidemia, unspecified: Secondary | ICD-10-CM | POA: Diagnosis not present

## 2021-06-26 DIAGNOSIS — F0390 Unspecified dementia without behavioral disturbance: Secondary | ICD-10-CM | POA: Diagnosis not present

## 2021-06-26 DIAGNOSIS — R630 Anorexia: Secondary | ICD-10-CM | POA: Diagnosis not present

## 2021-06-26 DIAGNOSIS — I4891 Unspecified atrial fibrillation: Secondary | ICD-10-CM | POA: Diagnosis not present

## 2021-06-27 DIAGNOSIS — F0390 Unspecified dementia without behavioral disturbance: Secondary | ICD-10-CM | POA: Diagnosis not present

## 2021-06-27 DIAGNOSIS — E785 Hyperlipidemia, unspecified: Secondary | ICD-10-CM | POA: Diagnosis not present

## 2021-06-27 DIAGNOSIS — R630 Anorexia: Secondary | ICD-10-CM | POA: Diagnosis not present

## 2021-06-27 DIAGNOSIS — I509 Heart failure, unspecified: Secondary | ICD-10-CM | POA: Diagnosis not present

## 2021-06-27 DIAGNOSIS — I110 Hypertensive heart disease with heart failure: Secondary | ICD-10-CM | POA: Diagnosis not present

## 2021-06-27 DIAGNOSIS — I4891 Unspecified atrial fibrillation: Secondary | ICD-10-CM | POA: Diagnosis not present

## 2021-06-28 DIAGNOSIS — R630 Anorexia: Secondary | ICD-10-CM | POA: Diagnosis not present

## 2021-06-28 DIAGNOSIS — F0390 Unspecified dementia without behavioral disturbance: Secondary | ICD-10-CM | POA: Diagnosis not present

## 2021-06-28 DIAGNOSIS — I110 Hypertensive heart disease with heart failure: Secondary | ICD-10-CM | POA: Diagnosis not present

## 2021-06-28 DIAGNOSIS — I509 Heart failure, unspecified: Secondary | ICD-10-CM | POA: Diagnosis not present

## 2021-06-28 DIAGNOSIS — E785 Hyperlipidemia, unspecified: Secondary | ICD-10-CM | POA: Diagnosis not present

## 2021-06-28 DIAGNOSIS — I4891 Unspecified atrial fibrillation: Secondary | ICD-10-CM | POA: Diagnosis not present

## 2021-07-02 DIAGNOSIS — I110 Hypertensive heart disease with heart failure: Secondary | ICD-10-CM | POA: Diagnosis not present

## 2021-07-02 DIAGNOSIS — R630 Anorexia: Secondary | ICD-10-CM | POA: Diagnosis not present

## 2021-07-02 DIAGNOSIS — E785 Hyperlipidemia, unspecified: Secondary | ICD-10-CM | POA: Diagnosis not present

## 2021-07-02 DIAGNOSIS — I509 Heart failure, unspecified: Secondary | ICD-10-CM | POA: Diagnosis not present

## 2021-07-02 DIAGNOSIS — F0390 Unspecified dementia without behavioral disturbance: Secondary | ICD-10-CM | POA: Diagnosis not present

## 2021-07-02 DIAGNOSIS — I4891 Unspecified atrial fibrillation: Secondary | ICD-10-CM | POA: Diagnosis not present

## 2021-07-03 DIAGNOSIS — I4891 Unspecified atrial fibrillation: Secondary | ICD-10-CM | POA: Diagnosis not present

## 2021-07-03 DIAGNOSIS — I110 Hypertensive heart disease with heart failure: Secondary | ICD-10-CM | POA: Diagnosis not present

## 2021-07-03 DIAGNOSIS — F0390 Unspecified dementia without behavioral disturbance: Secondary | ICD-10-CM | POA: Diagnosis not present

## 2021-07-03 DIAGNOSIS — E785 Hyperlipidemia, unspecified: Secondary | ICD-10-CM | POA: Diagnosis not present

## 2021-07-03 DIAGNOSIS — I509 Heart failure, unspecified: Secondary | ICD-10-CM | POA: Diagnosis not present

## 2021-07-03 DIAGNOSIS — R630 Anorexia: Secondary | ICD-10-CM | POA: Diagnosis not present

## 2021-07-04 DIAGNOSIS — I110 Hypertensive heart disease with heart failure: Secondary | ICD-10-CM | POA: Diagnosis not present

## 2021-07-04 DIAGNOSIS — I4891 Unspecified atrial fibrillation: Secondary | ICD-10-CM | POA: Diagnosis not present

## 2021-07-04 DIAGNOSIS — I509 Heart failure, unspecified: Secondary | ICD-10-CM | POA: Diagnosis not present

## 2021-07-04 DIAGNOSIS — E785 Hyperlipidemia, unspecified: Secondary | ICD-10-CM | POA: Diagnosis not present

## 2021-07-04 DIAGNOSIS — F0390 Unspecified dementia without behavioral disturbance: Secondary | ICD-10-CM | POA: Diagnosis not present

## 2021-07-04 DIAGNOSIS — R630 Anorexia: Secondary | ICD-10-CM | POA: Diagnosis not present

## 2021-07-05 DIAGNOSIS — I110 Hypertensive heart disease with heart failure: Secondary | ICD-10-CM | POA: Diagnosis not present

## 2021-07-05 DIAGNOSIS — R630 Anorexia: Secondary | ICD-10-CM | POA: Diagnosis not present

## 2021-07-05 DIAGNOSIS — I509 Heart failure, unspecified: Secondary | ICD-10-CM | POA: Diagnosis not present

## 2021-07-05 DIAGNOSIS — E785 Hyperlipidemia, unspecified: Secondary | ICD-10-CM | POA: Diagnosis not present

## 2021-07-05 DIAGNOSIS — I4891 Unspecified atrial fibrillation: Secondary | ICD-10-CM | POA: Diagnosis not present

## 2021-07-05 DIAGNOSIS — F0390 Unspecified dementia without behavioral disturbance: Secondary | ICD-10-CM | POA: Diagnosis not present

## 2021-07-06 DIAGNOSIS — E039 Hypothyroidism, unspecified: Secondary | ICD-10-CM | POA: Diagnosis not present

## 2021-07-06 DIAGNOSIS — J690 Pneumonitis due to inhalation of food and vomit: Secondary | ICD-10-CM | POA: Diagnosis not present

## 2021-07-06 DIAGNOSIS — K219 Gastro-esophageal reflux disease without esophagitis: Secondary | ICD-10-CM | POA: Diagnosis not present

## 2021-07-06 DIAGNOSIS — N400 Enlarged prostate without lower urinary tract symptoms: Secondary | ICD-10-CM | POA: Diagnosis not present

## 2021-07-06 DIAGNOSIS — F0390 Unspecified dementia without behavioral disturbance: Secondary | ICD-10-CM | POA: Diagnosis not present

## 2021-07-06 DIAGNOSIS — I25709 Atherosclerosis of coronary artery bypass graft(s), unspecified, with unspecified angina pectoris: Secondary | ICD-10-CM | POA: Diagnosis not present

## 2021-07-06 DIAGNOSIS — R630 Anorexia: Secondary | ICD-10-CM | POA: Diagnosis not present

## 2021-07-06 DIAGNOSIS — E119 Type 2 diabetes mellitus without complications: Secondary | ICD-10-CM | POA: Diagnosis not present

## 2021-07-06 DIAGNOSIS — E785 Hyperlipidemia, unspecified: Secondary | ICD-10-CM | POA: Diagnosis not present

## 2021-07-06 DIAGNOSIS — I110 Hypertensive heart disease with heart failure: Secondary | ICD-10-CM | POA: Diagnosis not present

## 2021-07-06 DIAGNOSIS — Z8719 Personal history of other diseases of the digestive system: Secondary | ICD-10-CM | POA: Diagnosis not present

## 2021-07-06 DIAGNOSIS — I509 Heart failure, unspecified: Secondary | ICD-10-CM | POA: Diagnosis not present

## 2021-07-06 DIAGNOSIS — I4891 Unspecified atrial fibrillation: Secondary | ICD-10-CM | POA: Diagnosis not present

## 2021-07-06 DIAGNOSIS — K279 Peptic ulcer, site unspecified, unspecified as acute or chronic, without hemorrhage or perforation: Secondary | ICD-10-CM | POA: Diagnosis not present

## 2021-07-09 DIAGNOSIS — I4891 Unspecified atrial fibrillation: Secondary | ICD-10-CM | POA: Diagnosis not present

## 2021-07-09 DIAGNOSIS — I110 Hypertensive heart disease with heart failure: Secondary | ICD-10-CM | POA: Diagnosis not present

## 2021-07-09 DIAGNOSIS — R630 Anorexia: Secondary | ICD-10-CM | POA: Diagnosis not present

## 2021-07-09 DIAGNOSIS — I509 Heart failure, unspecified: Secondary | ICD-10-CM | POA: Diagnosis not present

## 2021-07-09 DIAGNOSIS — E785 Hyperlipidemia, unspecified: Secondary | ICD-10-CM | POA: Diagnosis not present

## 2021-07-09 DIAGNOSIS — F0390 Unspecified dementia without behavioral disturbance: Secondary | ICD-10-CM | POA: Diagnosis not present

## 2021-07-10 DIAGNOSIS — I509 Heart failure, unspecified: Secondary | ICD-10-CM | POA: Diagnosis not present

## 2021-07-10 DIAGNOSIS — I110 Hypertensive heart disease with heart failure: Secondary | ICD-10-CM | POA: Diagnosis not present

## 2021-07-10 DIAGNOSIS — I4891 Unspecified atrial fibrillation: Secondary | ICD-10-CM | POA: Diagnosis not present

## 2021-07-10 DIAGNOSIS — E785 Hyperlipidemia, unspecified: Secondary | ICD-10-CM | POA: Diagnosis not present

## 2021-07-10 DIAGNOSIS — R630 Anorexia: Secondary | ICD-10-CM | POA: Diagnosis not present

## 2021-07-10 DIAGNOSIS — F0390 Unspecified dementia without behavioral disturbance: Secondary | ICD-10-CM | POA: Diagnosis not present

## 2021-07-11 DIAGNOSIS — I110 Hypertensive heart disease with heart failure: Secondary | ICD-10-CM | POA: Diagnosis not present

## 2021-07-11 DIAGNOSIS — E785 Hyperlipidemia, unspecified: Secondary | ICD-10-CM | POA: Diagnosis not present

## 2021-07-11 DIAGNOSIS — F0390 Unspecified dementia without behavioral disturbance: Secondary | ICD-10-CM | POA: Diagnosis not present

## 2021-07-11 DIAGNOSIS — R630 Anorexia: Secondary | ICD-10-CM | POA: Diagnosis not present

## 2021-07-11 DIAGNOSIS — I509 Heart failure, unspecified: Secondary | ICD-10-CM | POA: Diagnosis not present

## 2021-07-11 DIAGNOSIS — I4891 Unspecified atrial fibrillation: Secondary | ICD-10-CM | POA: Diagnosis not present

## 2021-07-12 DIAGNOSIS — E785 Hyperlipidemia, unspecified: Secondary | ICD-10-CM | POA: Diagnosis not present

## 2021-07-12 DIAGNOSIS — F0390 Unspecified dementia without behavioral disturbance: Secondary | ICD-10-CM | POA: Diagnosis not present

## 2021-07-12 DIAGNOSIS — I4891 Unspecified atrial fibrillation: Secondary | ICD-10-CM | POA: Diagnosis not present

## 2021-07-12 DIAGNOSIS — I110 Hypertensive heart disease with heart failure: Secondary | ICD-10-CM | POA: Diagnosis not present

## 2021-07-12 DIAGNOSIS — R630 Anorexia: Secondary | ICD-10-CM | POA: Diagnosis not present

## 2021-07-12 DIAGNOSIS — I509 Heart failure, unspecified: Secondary | ICD-10-CM | POA: Diagnosis not present

## 2021-07-16 DIAGNOSIS — E785 Hyperlipidemia, unspecified: Secondary | ICD-10-CM | POA: Diagnosis not present

## 2021-07-16 DIAGNOSIS — I110 Hypertensive heart disease with heart failure: Secondary | ICD-10-CM | POA: Diagnosis not present

## 2021-07-16 DIAGNOSIS — F0390 Unspecified dementia without behavioral disturbance: Secondary | ICD-10-CM | POA: Diagnosis not present

## 2021-07-16 DIAGNOSIS — I4891 Unspecified atrial fibrillation: Secondary | ICD-10-CM | POA: Diagnosis not present

## 2021-07-16 DIAGNOSIS — R630 Anorexia: Secondary | ICD-10-CM | POA: Diagnosis not present

## 2021-07-16 DIAGNOSIS — I509 Heart failure, unspecified: Secondary | ICD-10-CM | POA: Diagnosis not present

## 2021-07-17 DIAGNOSIS — I509 Heart failure, unspecified: Secondary | ICD-10-CM | POA: Diagnosis not present

## 2021-07-17 DIAGNOSIS — I110 Hypertensive heart disease with heart failure: Secondary | ICD-10-CM | POA: Diagnosis not present

## 2021-07-17 DIAGNOSIS — F0390 Unspecified dementia without behavioral disturbance: Secondary | ICD-10-CM | POA: Diagnosis not present

## 2021-07-17 DIAGNOSIS — R630 Anorexia: Secondary | ICD-10-CM | POA: Diagnosis not present

## 2021-07-17 DIAGNOSIS — E785 Hyperlipidemia, unspecified: Secondary | ICD-10-CM | POA: Diagnosis not present

## 2021-07-17 DIAGNOSIS — I4891 Unspecified atrial fibrillation: Secondary | ICD-10-CM | POA: Diagnosis not present

## 2021-07-18 DIAGNOSIS — F0390 Unspecified dementia without behavioral disturbance: Secondary | ICD-10-CM | POA: Diagnosis not present

## 2021-07-18 DIAGNOSIS — E785 Hyperlipidemia, unspecified: Secondary | ICD-10-CM | POA: Diagnosis not present

## 2021-07-18 DIAGNOSIS — I509 Heart failure, unspecified: Secondary | ICD-10-CM | POA: Diagnosis not present

## 2021-07-18 DIAGNOSIS — R630 Anorexia: Secondary | ICD-10-CM | POA: Diagnosis not present

## 2021-07-18 DIAGNOSIS — I110 Hypertensive heart disease with heart failure: Secondary | ICD-10-CM | POA: Diagnosis not present

## 2021-07-18 DIAGNOSIS — I4891 Unspecified atrial fibrillation: Secondary | ICD-10-CM | POA: Diagnosis not present

## 2021-07-19 DIAGNOSIS — I110 Hypertensive heart disease with heart failure: Secondary | ICD-10-CM | POA: Diagnosis not present

## 2021-07-19 DIAGNOSIS — I509 Heart failure, unspecified: Secondary | ICD-10-CM | POA: Diagnosis not present

## 2021-07-19 DIAGNOSIS — I4891 Unspecified atrial fibrillation: Secondary | ICD-10-CM | POA: Diagnosis not present

## 2021-07-19 DIAGNOSIS — R630 Anorexia: Secondary | ICD-10-CM | POA: Diagnosis not present

## 2021-07-19 DIAGNOSIS — F0390 Unspecified dementia without behavioral disturbance: Secondary | ICD-10-CM | POA: Diagnosis not present

## 2021-07-19 DIAGNOSIS — E785 Hyperlipidemia, unspecified: Secondary | ICD-10-CM | POA: Diagnosis not present

## 2021-07-23 DIAGNOSIS — I509 Heart failure, unspecified: Secondary | ICD-10-CM | POA: Diagnosis not present

## 2021-07-23 DIAGNOSIS — I4891 Unspecified atrial fibrillation: Secondary | ICD-10-CM | POA: Diagnosis not present

## 2021-07-23 DIAGNOSIS — R630 Anorexia: Secondary | ICD-10-CM | POA: Diagnosis not present

## 2021-07-23 DIAGNOSIS — E785 Hyperlipidemia, unspecified: Secondary | ICD-10-CM | POA: Diagnosis not present

## 2021-07-23 DIAGNOSIS — F0390 Unspecified dementia without behavioral disturbance: Secondary | ICD-10-CM | POA: Diagnosis not present

## 2021-07-23 DIAGNOSIS — I110 Hypertensive heart disease with heart failure: Secondary | ICD-10-CM | POA: Diagnosis not present

## 2021-07-24 DIAGNOSIS — I110 Hypertensive heart disease with heart failure: Secondary | ICD-10-CM | POA: Diagnosis not present

## 2021-07-24 DIAGNOSIS — E785 Hyperlipidemia, unspecified: Secondary | ICD-10-CM | POA: Diagnosis not present

## 2021-07-24 DIAGNOSIS — F0390 Unspecified dementia without behavioral disturbance: Secondary | ICD-10-CM | POA: Diagnosis not present

## 2021-07-24 DIAGNOSIS — R630 Anorexia: Secondary | ICD-10-CM | POA: Diagnosis not present

## 2021-07-24 DIAGNOSIS — I509 Heart failure, unspecified: Secondary | ICD-10-CM | POA: Diagnosis not present

## 2021-07-24 DIAGNOSIS — I4891 Unspecified atrial fibrillation: Secondary | ICD-10-CM | POA: Diagnosis not present

## 2021-12-03 ENCOUNTER — Other Ambulatory Visit: Payer: Self-pay | Admitting: Internal Medicine

## 2021-12-03 ENCOUNTER — Ambulatory Visit
Admission: RE | Admit: 2021-12-03 | Discharge: 2021-12-03 | Disposition: A | Discharge status: Home / Self Care | Payer: Medicare Other | Source: Ambulatory Visit | Attending: Internal Medicine | Admitting: Internal Medicine

## 2021-12-03 DIAGNOSIS — R638 Other symptoms and signs concerning food and fluid intake: Secondary | ICD-10-CM | POA: Diagnosis not present

## 2021-12-03 DIAGNOSIS — J9601 Acute respiratory failure with hypoxia: Secondary | ICD-10-CM

## 2021-12-03 DIAGNOSIS — R5381 Other malaise: Secondary | ICD-10-CM | POA: Diagnosis not present

## 2021-12-03 DIAGNOSIS — G9341 Metabolic encephalopathy: Secondary | ICD-10-CM | POA: Diagnosis not present

## 2021-12-03 DIAGNOSIS — Z952 Presence of prosthetic heart valve: Secondary | ICD-10-CM | POA: Diagnosis not present

## 2021-12-03 DIAGNOSIS — I700 Atherosclerosis of aorta: Secondary | ICD-10-CM | POA: Diagnosis not present

## 2021-12-03 DIAGNOSIS — I517 Cardiomegaly: Secondary | ICD-10-CM | POA: Diagnosis not present

## 2021-12-05 DIAGNOSIS — I110 Hypertensive heart disease with heart failure: Secondary | ICD-10-CM | POA: Diagnosis not present

## 2021-12-05 DIAGNOSIS — E039 Hypothyroidism, unspecified: Secondary | ICD-10-CM | POA: Diagnosis not present

## 2021-12-05 DIAGNOSIS — R630 Anorexia: Secondary | ICD-10-CM | POA: Diagnosis not present

## 2021-12-05 DIAGNOSIS — E119 Type 2 diabetes mellitus without complications: Secondary | ICD-10-CM | POA: Diagnosis not present

## 2021-12-05 DIAGNOSIS — N400 Enlarged prostate without lower urinary tract symptoms: Secondary | ICD-10-CM | POA: Diagnosis not present

## 2021-12-05 DIAGNOSIS — I509 Heart failure, unspecified: Secondary | ICD-10-CM | POA: Diagnosis not present

## 2021-12-05 DIAGNOSIS — I25709 Atherosclerosis of coronary artery bypass graft(s), unspecified, with unspecified angina pectoris: Secondary | ICD-10-CM | POA: Diagnosis not present

## 2021-12-05 DIAGNOSIS — I4891 Unspecified atrial fibrillation: Secondary | ICD-10-CM | POA: Diagnosis not present

## 2021-12-05 DIAGNOSIS — F0390 Unspecified dementia without behavioral disturbance: Secondary | ICD-10-CM | POA: Diagnosis not present

## 2021-12-05 DIAGNOSIS — K279 Peptic ulcer, site unspecified, unspecified as acute or chronic, without hemorrhage or perforation: Secondary | ICD-10-CM | POA: Diagnosis not present

## 2021-12-05 DIAGNOSIS — J690 Pneumonitis due to inhalation of food and vomit: Secondary | ICD-10-CM | POA: Diagnosis not present

## 2021-12-05 DIAGNOSIS — E785 Hyperlipidemia, unspecified: Secondary | ICD-10-CM | POA: Diagnosis not present

## 2021-12-05 DIAGNOSIS — Z8719 Personal history of other diseases of the digestive system: Secondary | ICD-10-CM | POA: Diagnosis not present

## 2021-12-05 DIAGNOSIS — K219 Gastro-esophageal reflux disease without esophagitis: Secondary | ICD-10-CM | POA: Diagnosis not present

## 2021-12-06 DIAGNOSIS — I110 Hypertensive heart disease with heart failure: Secondary | ICD-10-CM | POA: Diagnosis not present

## 2021-12-06 DIAGNOSIS — R630 Anorexia: Secondary | ICD-10-CM | POA: Diagnosis not present

## 2021-12-06 DIAGNOSIS — I509 Heart failure, unspecified: Secondary | ICD-10-CM | POA: Diagnosis not present

## 2021-12-06 DIAGNOSIS — E785 Hyperlipidemia, unspecified: Secondary | ICD-10-CM | POA: Diagnosis not present

## 2021-12-06 DIAGNOSIS — F0390 Unspecified dementia without behavioral disturbance: Secondary | ICD-10-CM | POA: Diagnosis not present

## 2021-12-06 DIAGNOSIS — I4891 Unspecified atrial fibrillation: Secondary | ICD-10-CM | POA: Diagnosis not present

## 2021-12-08 DIAGNOSIS — I509 Heart failure, unspecified: Secondary | ICD-10-CM | POA: Diagnosis not present

## 2021-12-08 DIAGNOSIS — R630 Anorexia: Secondary | ICD-10-CM | POA: Diagnosis not present

## 2021-12-08 DIAGNOSIS — F0390 Unspecified dementia without behavioral disturbance: Secondary | ICD-10-CM | POA: Diagnosis not present

## 2021-12-08 DIAGNOSIS — I4891 Unspecified atrial fibrillation: Secondary | ICD-10-CM | POA: Diagnosis not present

## 2021-12-08 DIAGNOSIS — I110 Hypertensive heart disease with heart failure: Secondary | ICD-10-CM | POA: Diagnosis not present

## 2021-12-08 DIAGNOSIS — E785 Hyperlipidemia, unspecified: Secondary | ICD-10-CM | POA: Diagnosis not present

## 2021-12-09 DIAGNOSIS — I110 Hypertensive heart disease with heart failure: Secondary | ICD-10-CM | POA: Diagnosis not present

## 2021-12-09 DIAGNOSIS — I509 Heart failure, unspecified: Secondary | ICD-10-CM | POA: Diagnosis not present

## 2021-12-09 DIAGNOSIS — E785 Hyperlipidemia, unspecified: Secondary | ICD-10-CM | POA: Diagnosis not present

## 2021-12-09 DIAGNOSIS — F0390 Unspecified dementia without behavioral disturbance: Secondary | ICD-10-CM | POA: Diagnosis not present

## 2021-12-09 DIAGNOSIS — I4891 Unspecified atrial fibrillation: Secondary | ICD-10-CM | POA: Diagnosis not present

## 2021-12-09 DIAGNOSIS — R630 Anorexia: Secondary | ICD-10-CM | POA: Diagnosis not present

## 2021-12-10 DIAGNOSIS — F0390 Unspecified dementia without behavioral disturbance: Secondary | ICD-10-CM | POA: Diagnosis not present

## 2021-12-10 DIAGNOSIS — I110 Hypertensive heart disease with heart failure: Secondary | ICD-10-CM | POA: Diagnosis not present

## 2021-12-10 DIAGNOSIS — I509 Heart failure, unspecified: Secondary | ICD-10-CM | POA: Diagnosis not present

## 2021-12-10 DIAGNOSIS — I4891 Unspecified atrial fibrillation: Secondary | ICD-10-CM | POA: Diagnosis not present

## 2021-12-10 DIAGNOSIS — R630 Anorexia: Secondary | ICD-10-CM | POA: Diagnosis not present

## 2021-12-10 DIAGNOSIS — E785 Hyperlipidemia, unspecified: Secondary | ICD-10-CM | POA: Diagnosis not present

## 2021-12-11 DIAGNOSIS — I4891 Unspecified atrial fibrillation: Secondary | ICD-10-CM | POA: Diagnosis not present

## 2021-12-11 DIAGNOSIS — I110 Hypertensive heart disease with heart failure: Secondary | ICD-10-CM | POA: Diagnosis not present

## 2021-12-11 DIAGNOSIS — R630 Anorexia: Secondary | ICD-10-CM | POA: Diagnosis not present

## 2021-12-11 DIAGNOSIS — I509 Heart failure, unspecified: Secondary | ICD-10-CM | POA: Diagnosis not present

## 2021-12-11 DIAGNOSIS — F0390 Unspecified dementia without behavioral disturbance: Secondary | ICD-10-CM | POA: Diagnosis not present

## 2021-12-11 DIAGNOSIS — E785 Hyperlipidemia, unspecified: Secondary | ICD-10-CM | POA: Diagnosis not present

## 2021-12-12 DIAGNOSIS — R630 Anorexia: Secondary | ICD-10-CM | POA: Diagnosis not present

## 2021-12-12 DIAGNOSIS — I110 Hypertensive heart disease with heart failure: Secondary | ICD-10-CM | POA: Diagnosis not present

## 2021-12-12 DIAGNOSIS — I509 Heart failure, unspecified: Secondary | ICD-10-CM | POA: Diagnosis not present

## 2021-12-12 DIAGNOSIS — I4891 Unspecified atrial fibrillation: Secondary | ICD-10-CM | POA: Diagnosis not present

## 2021-12-12 DIAGNOSIS — E785 Hyperlipidemia, unspecified: Secondary | ICD-10-CM | POA: Diagnosis not present

## 2021-12-12 DIAGNOSIS — F0390 Unspecified dementia without behavioral disturbance: Secondary | ICD-10-CM | POA: Diagnosis not present

## 2021-12-13 DIAGNOSIS — R630 Anorexia: Secondary | ICD-10-CM | POA: Diagnosis not present

## 2021-12-13 DIAGNOSIS — I4891 Unspecified atrial fibrillation: Secondary | ICD-10-CM | POA: Diagnosis not present

## 2021-12-13 DIAGNOSIS — I509 Heart failure, unspecified: Secondary | ICD-10-CM | POA: Diagnosis not present

## 2021-12-13 DIAGNOSIS — E785 Hyperlipidemia, unspecified: Secondary | ICD-10-CM | POA: Diagnosis not present

## 2021-12-13 DIAGNOSIS — F0390 Unspecified dementia without behavioral disturbance: Secondary | ICD-10-CM | POA: Diagnosis not present

## 2021-12-13 DIAGNOSIS — I110 Hypertensive heart disease with heart failure: Secondary | ICD-10-CM | POA: Diagnosis not present

## 2021-12-14 DIAGNOSIS — E785 Hyperlipidemia, unspecified: Secondary | ICD-10-CM | POA: Diagnosis not present

## 2021-12-14 DIAGNOSIS — R630 Anorexia: Secondary | ICD-10-CM | POA: Diagnosis not present

## 2021-12-14 DIAGNOSIS — I110 Hypertensive heart disease with heart failure: Secondary | ICD-10-CM | POA: Diagnosis not present

## 2021-12-14 DIAGNOSIS — I509 Heart failure, unspecified: Secondary | ICD-10-CM | POA: Diagnosis not present

## 2021-12-14 DIAGNOSIS — I4891 Unspecified atrial fibrillation: Secondary | ICD-10-CM | POA: Diagnosis not present

## 2021-12-14 DIAGNOSIS — F0390 Unspecified dementia without behavioral disturbance: Secondary | ICD-10-CM | POA: Diagnosis not present

## 2021-12-15 DIAGNOSIS — I4891 Unspecified atrial fibrillation: Secondary | ICD-10-CM | POA: Diagnosis not present

## 2021-12-15 DIAGNOSIS — R630 Anorexia: Secondary | ICD-10-CM | POA: Diagnosis not present

## 2021-12-15 DIAGNOSIS — I110 Hypertensive heart disease with heart failure: Secondary | ICD-10-CM | POA: Diagnosis not present

## 2021-12-15 DIAGNOSIS — E785 Hyperlipidemia, unspecified: Secondary | ICD-10-CM | POA: Diagnosis not present

## 2021-12-15 DIAGNOSIS — F0390 Unspecified dementia without behavioral disturbance: Secondary | ICD-10-CM | POA: Diagnosis not present

## 2021-12-15 DIAGNOSIS — I509 Heart failure, unspecified: Secondary | ICD-10-CM | POA: Diagnosis not present

## 2021-12-16 DIAGNOSIS — I509 Heart failure, unspecified: Secondary | ICD-10-CM | POA: Diagnosis not present

## 2021-12-16 DIAGNOSIS — F0390 Unspecified dementia without behavioral disturbance: Secondary | ICD-10-CM | POA: Diagnosis not present

## 2021-12-16 DIAGNOSIS — E785 Hyperlipidemia, unspecified: Secondary | ICD-10-CM | POA: Diagnosis not present

## 2021-12-16 DIAGNOSIS — I4891 Unspecified atrial fibrillation: Secondary | ICD-10-CM | POA: Diagnosis not present

## 2021-12-16 DIAGNOSIS — I110 Hypertensive heart disease with heart failure: Secondary | ICD-10-CM | POA: Diagnosis not present

## 2021-12-16 DIAGNOSIS — R630 Anorexia: Secondary | ICD-10-CM | POA: Diagnosis not present

## 2021-12-17 DIAGNOSIS — F0390 Unspecified dementia without behavioral disturbance: Secondary | ICD-10-CM | POA: Diagnosis not present

## 2021-12-17 DIAGNOSIS — I110 Hypertensive heart disease with heart failure: Secondary | ICD-10-CM | POA: Diagnosis not present

## 2021-12-17 DIAGNOSIS — I509 Heart failure, unspecified: Secondary | ICD-10-CM | POA: Diagnosis not present

## 2021-12-17 DIAGNOSIS — E785 Hyperlipidemia, unspecified: Secondary | ICD-10-CM | POA: Diagnosis not present

## 2021-12-17 DIAGNOSIS — R630 Anorexia: Secondary | ICD-10-CM | POA: Diagnosis not present

## 2021-12-17 DIAGNOSIS — I4891 Unspecified atrial fibrillation: Secondary | ICD-10-CM | POA: Diagnosis not present

## 2021-12-18 DIAGNOSIS — E785 Hyperlipidemia, unspecified: Secondary | ICD-10-CM | POA: Diagnosis not present

## 2021-12-18 DIAGNOSIS — I4891 Unspecified atrial fibrillation: Secondary | ICD-10-CM | POA: Diagnosis not present

## 2021-12-18 DIAGNOSIS — F0390 Unspecified dementia without behavioral disturbance: Secondary | ICD-10-CM | POA: Diagnosis not present

## 2021-12-18 DIAGNOSIS — I509 Heart failure, unspecified: Secondary | ICD-10-CM | POA: Diagnosis not present

## 2021-12-18 DIAGNOSIS — R630 Anorexia: Secondary | ICD-10-CM | POA: Diagnosis not present

## 2021-12-18 DIAGNOSIS — I110 Hypertensive heart disease with heart failure: Secondary | ICD-10-CM | POA: Diagnosis not present

## 2021-12-19 DIAGNOSIS — I110 Hypertensive heart disease with heart failure: Secondary | ICD-10-CM | POA: Diagnosis not present

## 2021-12-19 DIAGNOSIS — F0390 Unspecified dementia without behavioral disturbance: Secondary | ICD-10-CM | POA: Diagnosis not present

## 2021-12-19 DIAGNOSIS — I509 Heart failure, unspecified: Secondary | ICD-10-CM | POA: Diagnosis not present

## 2021-12-19 DIAGNOSIS — E785 Hyperlipidemia, unspecified: Secondary | ICD-10-CM | POA: Diagnosis not present

## 2021-12-19 DIAGNOSIS — R630 Anorexia: Secondary | ICD-10-CM | POA: Diagnosis not present

## 2021-12-19 DIAGNOSIS — I4891 Unspecified atrial fibrillation: Secondary | ICD-10-CM | POA: Diagnosis not present

## 2021-12-20 DIAGNOSIS — I509 Heart failure, unspecified: Secondary | ICD-10-CM | POA: Diagnosis not present

## 2021-12-20 DIAGNOSIS — F0390 Unspecified dementia without behavioral disturbance: Secondary | ICD-10-CM | POA: Diagnosis not present

## 2021-12-20 DIAGNOSIS — E785 Hyperlipidemia, unspecified: Secondary | ICD-10-CM | POA: Diagnosis not present

## 2021-12-20 DIAGNOSIS — I4891 Unspecified atrial fibrillation: Secondary | ICD-10-CM | POA: Diagnosis not present

## 2021-12-20 DIAGNOSIS — I110 Hypertensive heart disease with heart failure: Secondary | ICD-10-CM | POA: Diagnosis not present

## 2021-12-20 DIAGNOSIS — R630 Anorexia: Secondary | ICD-10-CM | POA: Diagnosis not present

## 2021-12-21 DIAGNOSIS — I509 Heart failure, unspecified: Secondary | ICD-10-CM | POA: Diagnosis not present

## 2021-12-21 DIAGNOSIS — I4891 Unspecified atrial fibrillation: Secondary | ICD-10-CM | POA: Diagnosis not present

## 2021-12-21 DIAGNOSIS — F0390 Unspecified dementia without behavioral disturbance: Secondary | ICD-10-CM | POA: Diagnosis not present

## 2021-12-21 DIAGNOSIS — I110 Hypertensive heart disease with heart failure: Secondary | ICD-10-CM | POA: Diagnosis not present

## 2021-12-21 DIAGNOSIS — R630 Anorexia: Secondary | ICD-10-CM | POA: Diagnosis not present

## 2021-12-21 DIAGNOSIS — E785 Hyperlipidemia, unspecified: Secondary | ICD-10-CM | POA: Diagnosis not present

## 2021-12-22 DIAGNOSIS — R630 Anorexia: Secondary | ICD-10-CM | POA: Diagnosis not present

## 2021-12-22 DIAGNOSIS — I509 Heart failure, unspecified: Secondary | ICD-10-CM | POA: Diagnosis not present

## 2021-12-22 DIAGNOSIS — I4891 Unspecified atrial fibrillation: Secondary | ICD-10-CM | POA: Diagnosis not present

## 2021-12-22 DIAGNOSIS — E785 Hyperlipidemia, unspecified: Secondary | ICD-10-CM | POA: Diagnosis not present

## 2021-12-22 DIAGNOSIS — F0390 Unspecified dementia without behavioral disturbance: Secondary | ICD-10-CM | POA: Diagnosis not present

## 2021-12-22 DIAGNOSIS — I110 Hypertensive heart disease with heart failure: Secondary | ICD-10-CM | POA: Diagnosis not present

## 2022-01-04 DEATH — deceased

## 2022-03-04 IMAGING — CT CT HEAD W/O CM
4 series · 16 of 47 positions shown, 18 images · non-contrast
Comparison: CT dated 07/08/2019.

CLINICAL DATA: Altered mental status.

EXAM:
CT HEAD WITHOUT CONTRAST
TECHNIQUE: Contiguous axial images were obtained from the base of the skull
through the vertex without intravenous contrast.

[Series 3: head wo · axial · 0.39mm/px · z∈[-106,+4]mm · 7 of 30 slices shown, 9 images]
[im 4/30  brain]
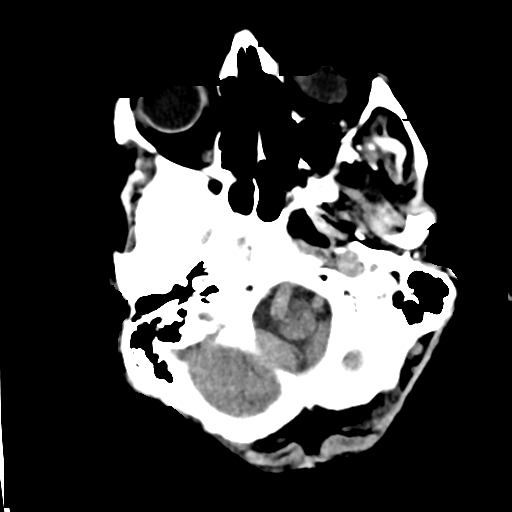
[im 4/30  bone]
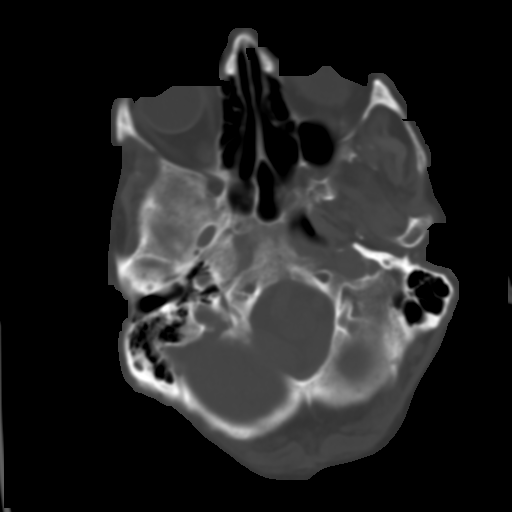
[im 8/30  brain]
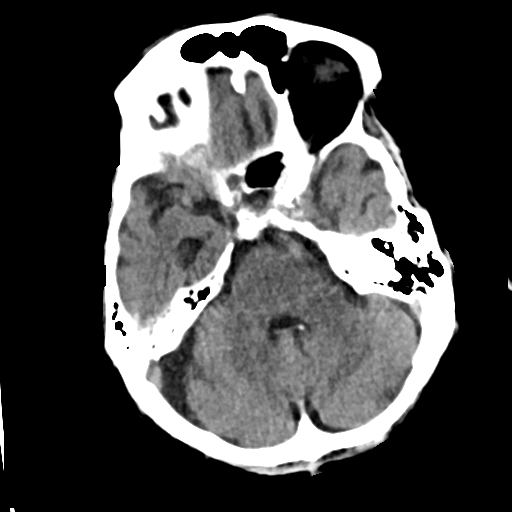
[im 11/30  brain]
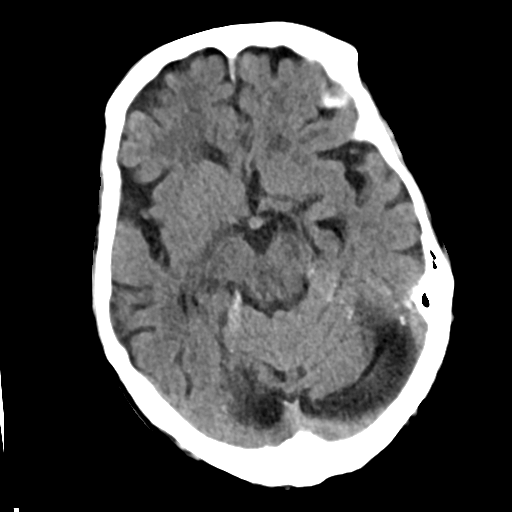
[im 15/30  brain]
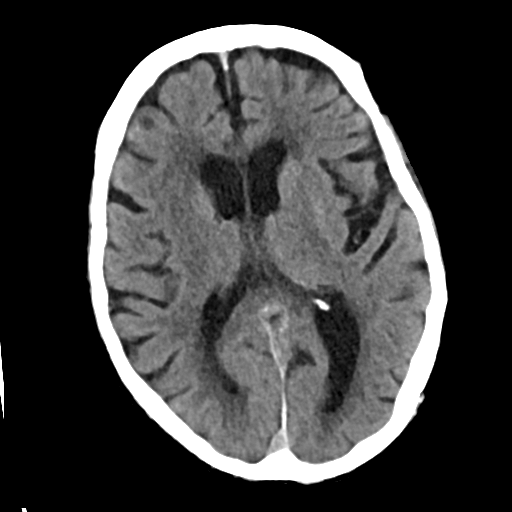
[im 19/30  brain]
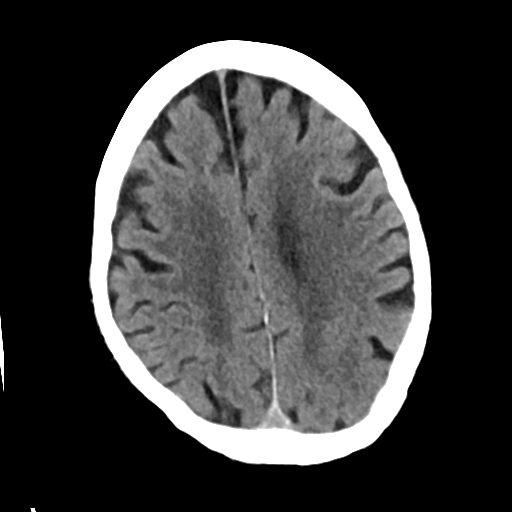
[im 19/30  bone]
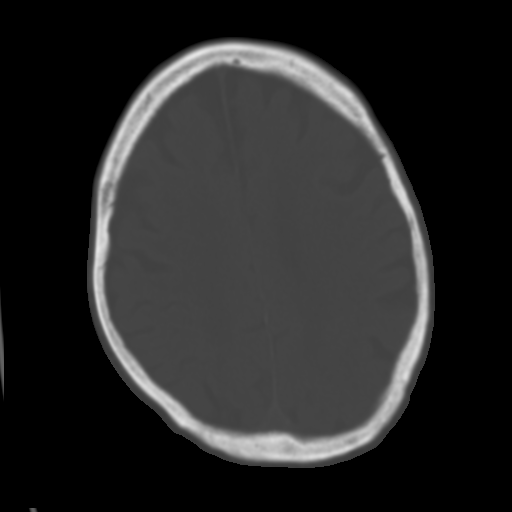
[im 22/30  brain]
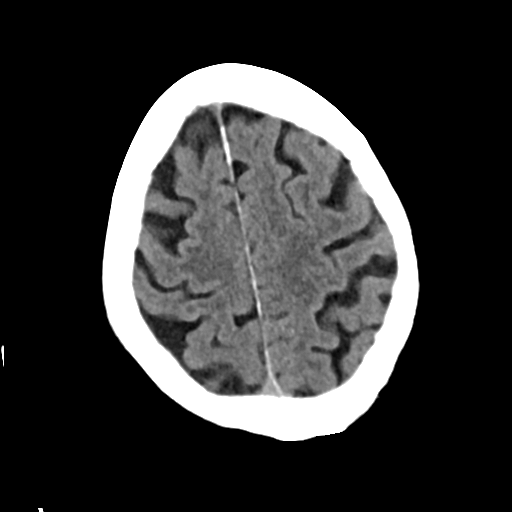
[im 26/30  brain]
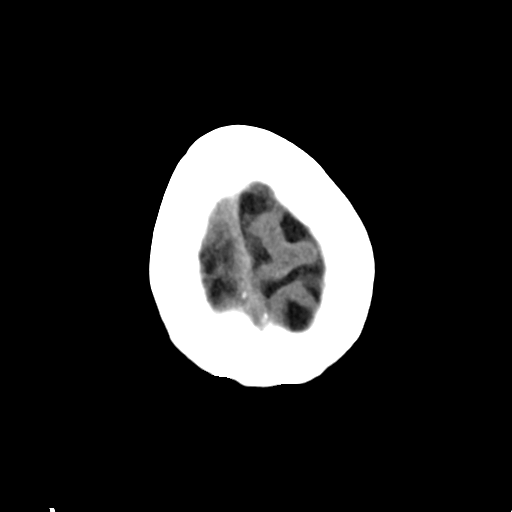

[Series 4: head bone · axial · 0.39mm/px · z∈[-107,-77]mm · 3 of 75 slices shown]
[im 8/75  bone]
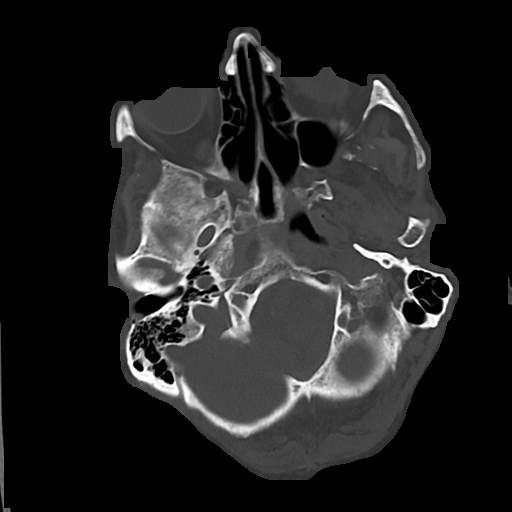
[im 15/75  bone]
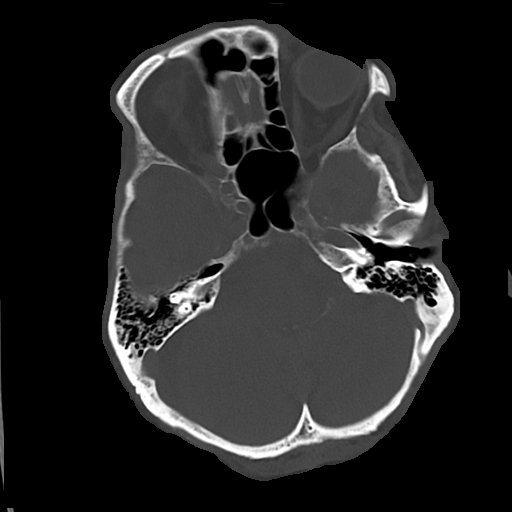
[im 23/75  bone]
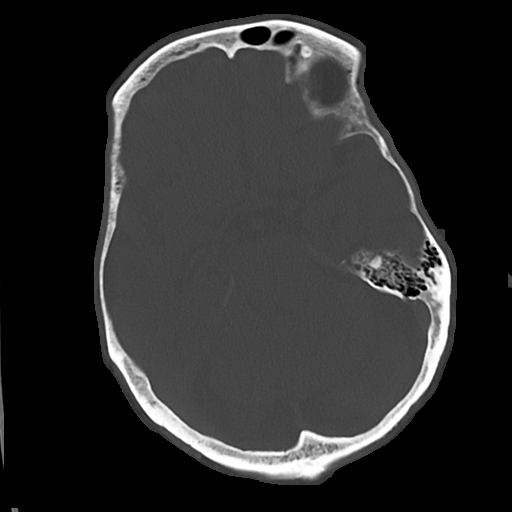

[Series 5: cor soft · coronal · 0.30mm/px · 3 of 66 slices shown]
[im 22/66  brain]
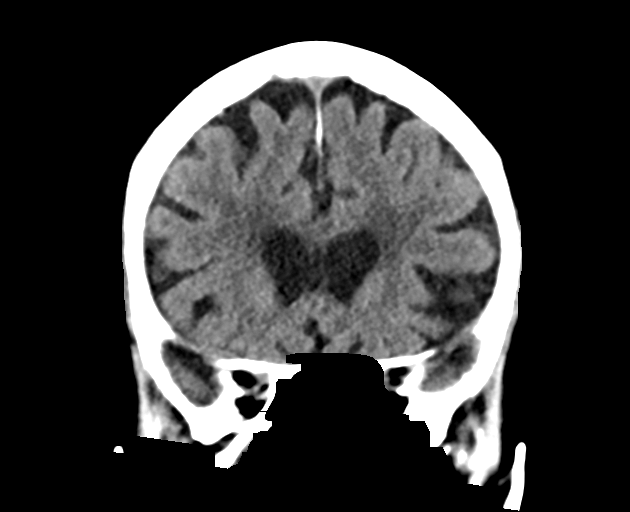
[im 29/66  brain]
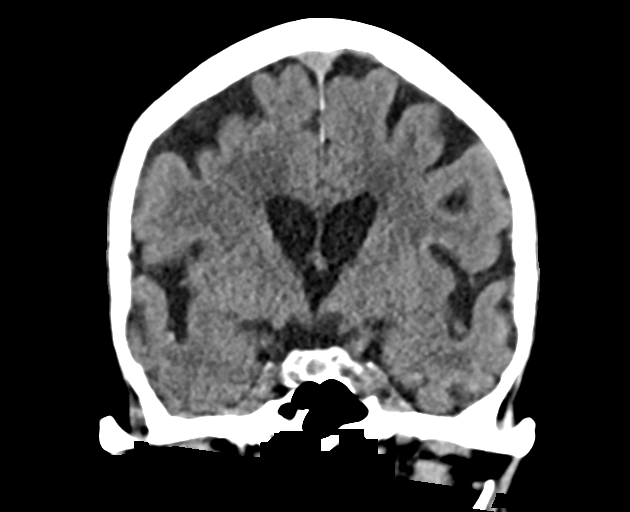
[im 37/66  brain]
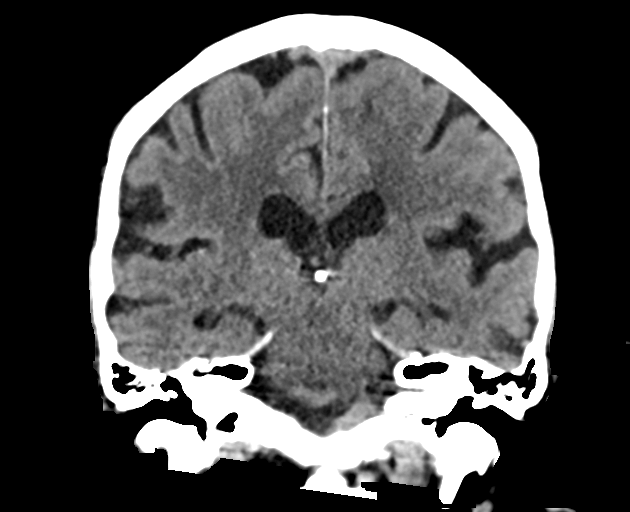

[Series 6: sag soft · sagittal · 0.30mm/px · 3 of 63 slices shown]
[im 24/63  brain]
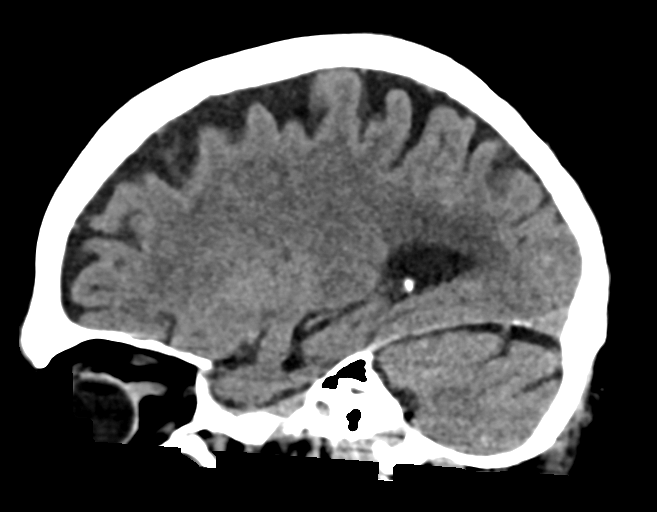
[im 32/63  brain]
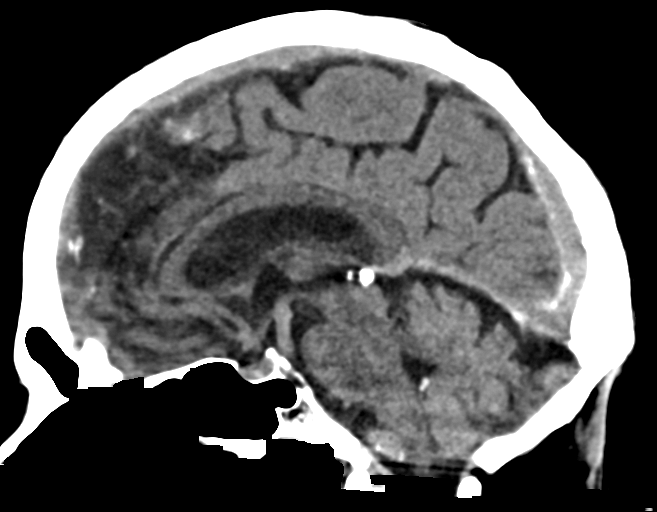
[im 39/63  brain]
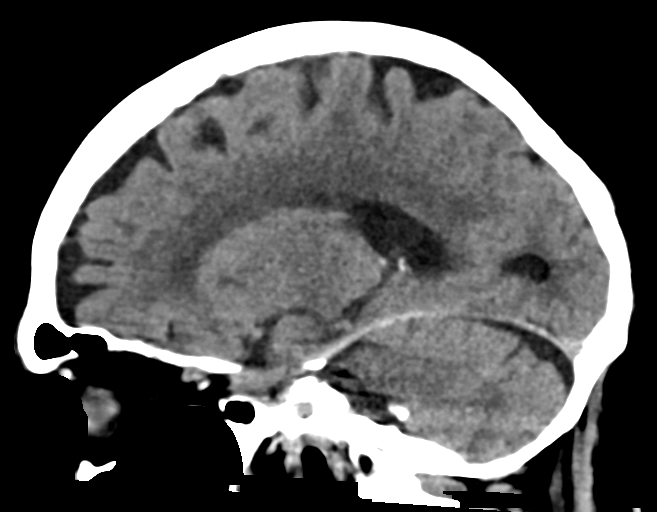

[16 of 47 positions shown; findings below may reference images not displayed]

FINDINGS: Brain: No evidence of acute infarction, hemorrhage, hydrocephalus,
extra-axial collection or mass lesion/mass effect. Atrophy and
chronic microvascular ischemic changes are again noted.

Vascular: No hyperdense vessel or unexpected calcification.

Skull: Normal. Negative for fracture or focal lesion.

Sinuses/Orbits: This complete opacification of the right maxillary
sinus which is likely chronic. The remaining paranasal sinuses and
mastoid air cells are essentially clear.

Other: None.
IMPRESSION: 1. No acute intracranial abnormality.
2. Atrophy and chronic microvascular ischemic changes.
3. Chronic right maxillary sinusitis.

## 2022-03-04 IMAGING — CT CT ABD-PELV W/O CM
2 series · 13 of 42 positions shown, 14 images · non-contrast
Comparison: 11/10/2011

CLINICAL DATA: Suspect bowel obstruction. Sepsis. Previous bowel
surgeries. Nausea, vomiting and diarrhea. Acute renal insufficiency.

EXAM:
CT ABDOMEN AND PELVIS WITHOUT CONTRAST
TECHNIQUE: Multidetector CT imaging of the abdomen and pelvis was performed
following the standard protocol without IV contrast.

[Series 7: cor · coronal · 0.72mm/px · 12 of 94 slices shown, 13 images]
[im 8/94  lung]
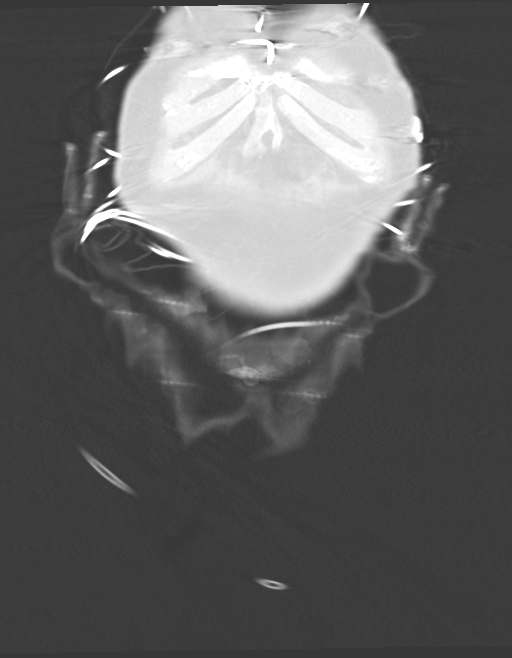
[im 11/94  soft-tissue]
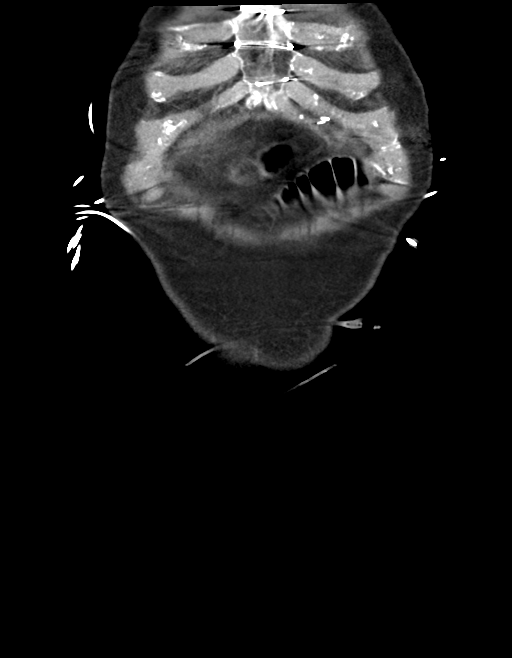
[im 11/94  bone]
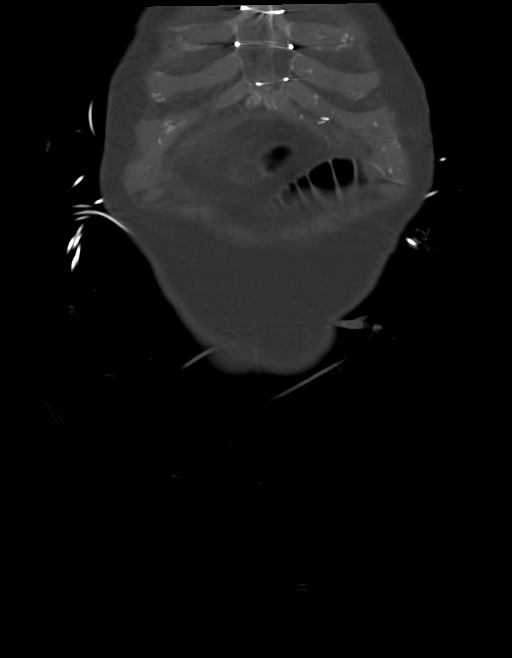
[im 15/94  lung]
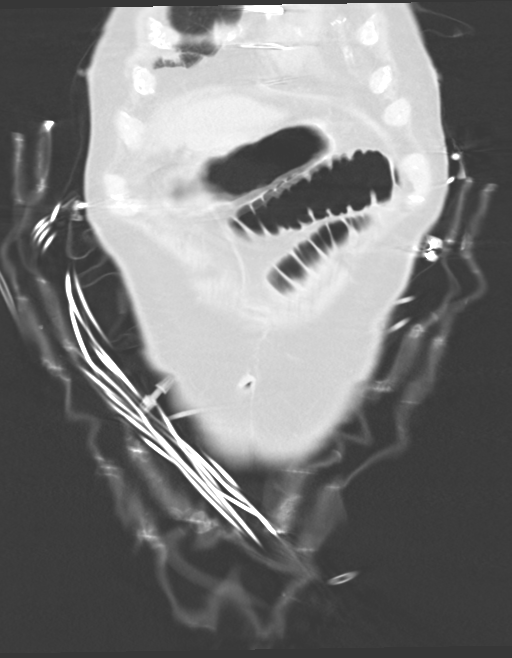
[im 21/94  soft-tissue]
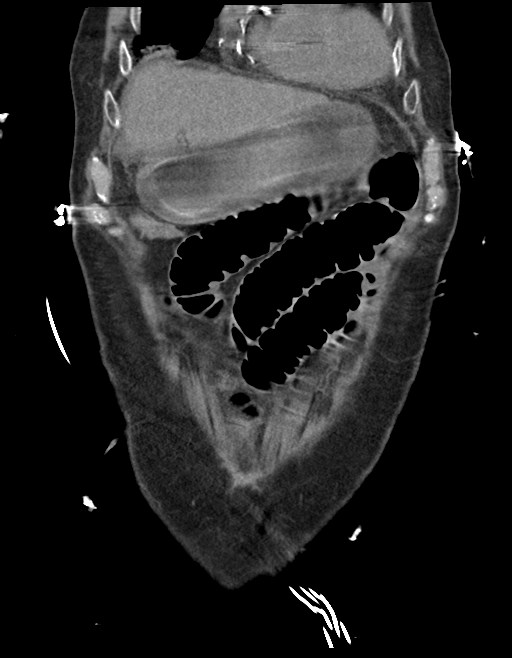
[im 22/94  lung]
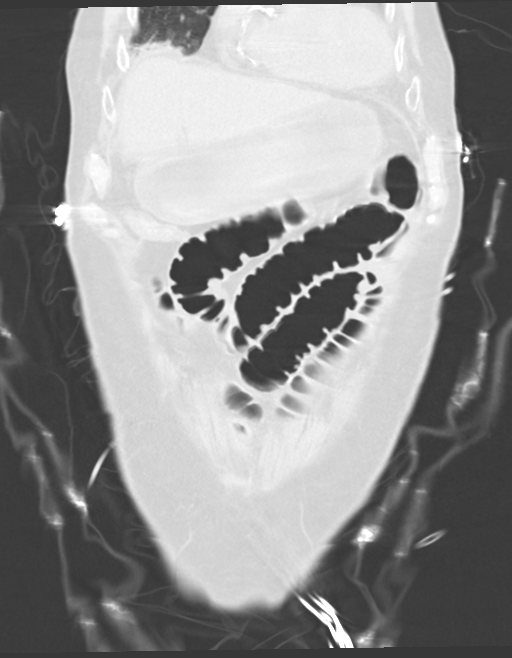
[im 29/94  lung]
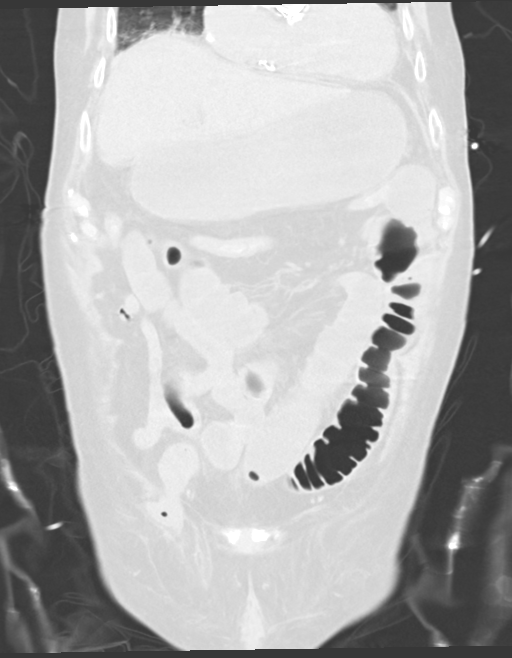
[im 32/94  soft-tissue]
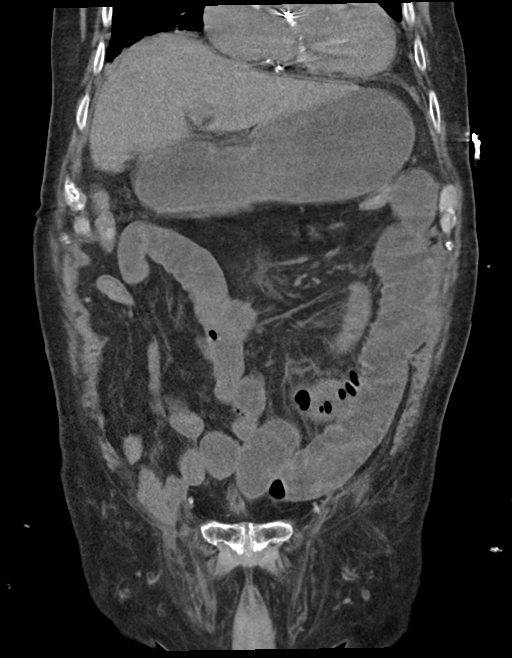
[im 42/94  soft-tissue]
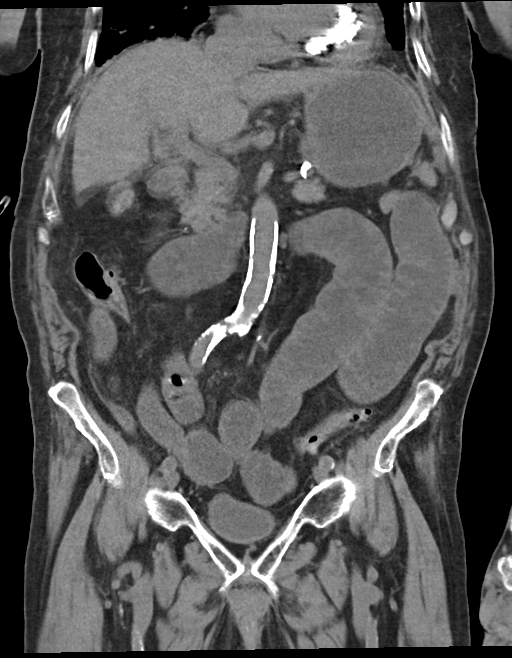
[im 52/94  soft-tissue]
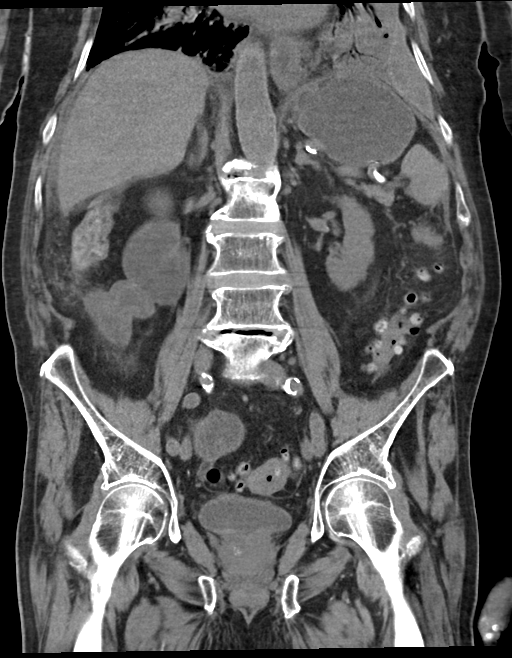
[im 63/94  soft-tissue]
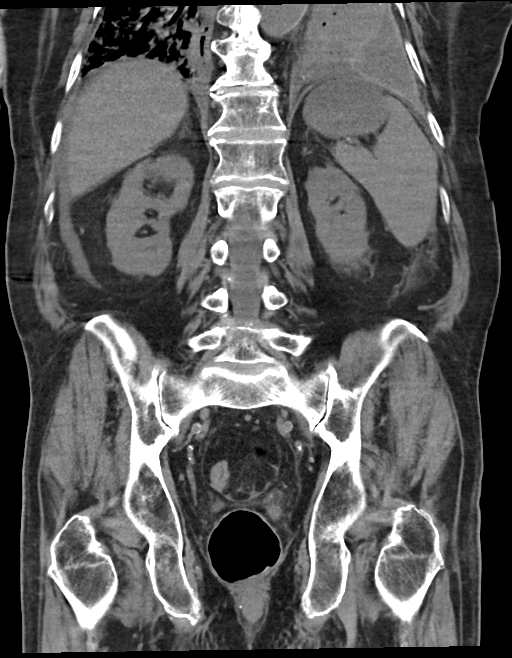
[im 73/94  soft-tissue]
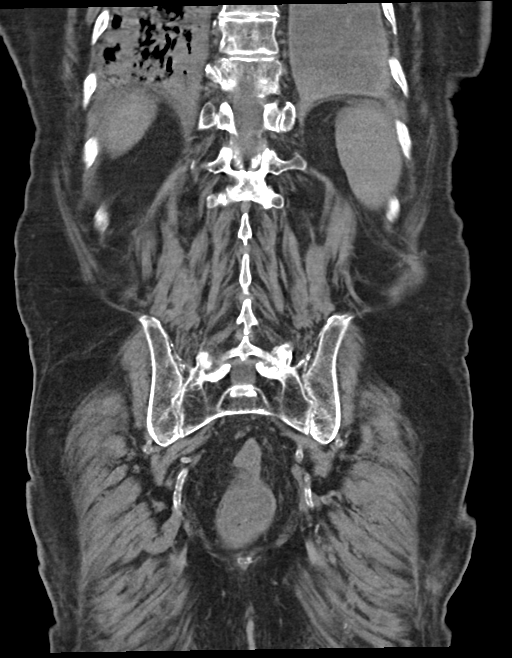
[im 83/94  soft-tissue]
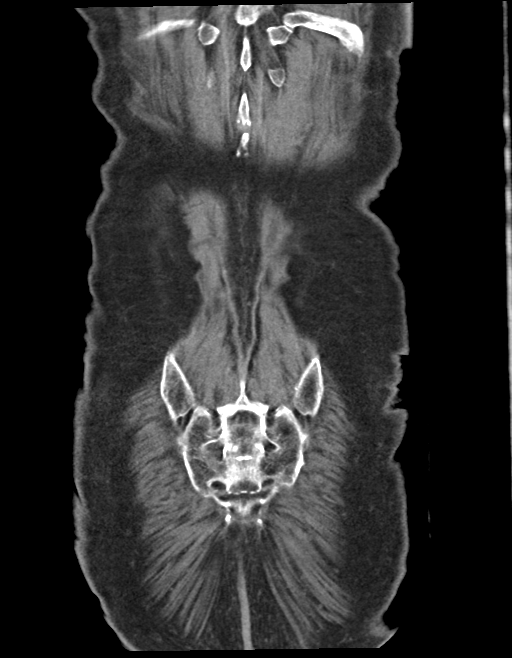

[Series 8: sag · sagittal · 0.55mm/px · 1 of 124 slices shown]
[im 58/124  soft-tissue]
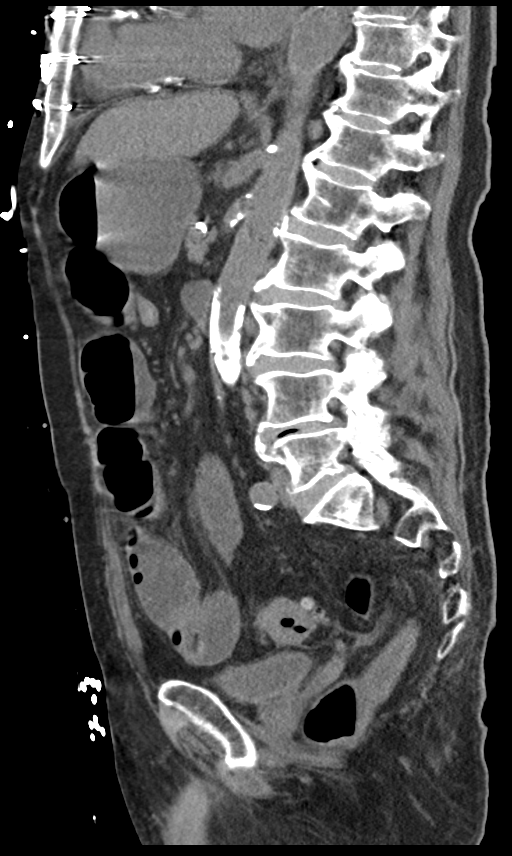

[13 of 42 positions shown; findings below may reference images not displayed]

FINDINGS: Lower chest: Sternotomy wires are present. Prosthetic material
versus calcification over the mitral valve. Suggestion of previous
aortic valve surgery. Moderate bibasilar opacification likely due to
infection as well as associated basilar atelectasis. There is a
small to moderate size left pleural effusion with enhancing pleura
suggesting empyema. Calcification over the descending thoracic
aorta.

Hepatobiliary: Cholelithiasis. Liver and biliary tree are
unremarkable.

Pancreas: Normal.

Spleen: Normal.

Adrenals/Urinary Tract: Adrenal glands are normal. Kidneys normal in
size without hydronephrosis or nephrolithiasis. Small cyst over the
upper pole right kidney. Ureters and bladder are normal.

Stomach/Bowel: Mild gastric distension. Fluid over the distal
esophagus which may be due to reflux or dysmotility. Mild dilatation
of the distal aspect of the duodenal C sweep. There are multiple air
and fluid-filled mildly dilated jejunal loops measuring up to 3.5 cm
in diameter. No definite transition point. Surgical sutures over the
right colon with ileocolic anastomosis. Moderate diverticulosis of
the distal descending and sigmoid colon without active inflammation.
Colon is somewhat decompressed.

Vascular/Lymphatic: Moderate calcified plaque over the abdominal
aorta which is normal in caliber. No adenopathy.

Reproductive: Normal.

Other: Small right inguinal hernia containing a short segment of
small bowel. Small amount of free fluid over the right pericolic
gutter. No significant focal inflammatory change. No free peritoneal
air.

Musculoskeletal: Degenerative changes of the spine and hips.
IMPRESSION: 1. Multiple dilated jejunal loops with somewhat decompressed colon.
No transition point identified. Small amount of free fluid over the
right pericolic gutter. Findings may be due to early/partial small
bowel obstruction versus ileus.

2. Moderate airspace opacification over the mid to lower right lung
and left base likely due to infection with associated basilar
atelectasis. Moderate size left effusion with enhancing pleura
suggesting empyema.

3.  Cholelithiasis.

4.  Colonic diverticulosis.

5.  Small upper pole right renal cyst.

6.  Aortic Atherosclerosis ([id number]-AA4.4).

7.  Right inguinal hernia containing a short segment of small bowel.
